# Patient Record
Sex: Female | Born: 1950 | Race: White | Hispanic: No | Marital: Married | Smoking: Former smoker
Health system: Southern US, Community
[De-identification: ages and names within clinical notes are randomized; demographics above are authoritative.]

## PROBLEM LIST (undated history)

## (undated) DIAGNOSIS — E079 Disorder of thyroid, unspecified: Secondary | ICD-10-CM

## (undated) DIAGNOSIS — K5790 Diverticulosis of intestine, part unspecified, without perforation or abscess without bleeding: Secondary | ICD-10-CM

## (undated) DIAGNOSIS — I10 Essential (primary) hypertension: Secondary | ICD-10-CM

## (undated) DIAGNOSIS — E0500 Thyrotoxicosis with diffuse goiter without thyrotoxic crisis or storm: Secondary | ICD-10-CM

## (undated) DIAGNOSIS — J45909 Unspecified asthma, uncomplicated: Secondary | ICD-10-CM

## (undated) DIAGNOSIS — E7800 Pure hypercholesterolemia, unspecified: Secondary | ICD-10-CM

## (undated) DIAGNOSIS — I2510 Atherosclerotic heart disease of native coronary artery without angina pectoris: Secondary | ICD-10-CM

## (undated) DIAGNOSIS — N200 Calculus of kidney: Secondary | ICD-10-CM

## (undated) DIAGNOSIS — M069 Rheumatoid arthritis, unspecified: Secondary | ICD-10-CM

## (undated) DIAGNOSIS — R42 Dizziness and giddiness: Secondary | ICD-10-CM

## (undated) DIAGNOSIS — M810 Age-related osteoporosis without current pathological fracture: Secondary | ICD-10-CM

## (undated) DIAGNOSIS — E039 Hypothyroidism, unspecified: Secondary | ICD-10-CM

## (undated) HISTORY — DX: Hypothyroidism, unspecified: E03.9

## (undated) HISTORY — DX: Unspecified asthma, uncomplicated: J45.909

## (undated) HISTORY — DX: Disorder of thyroid, unspecified: E07.9

## (undated) HISTORY — DX: Pure hypercholesterolemia, unspecified: E78.00

## (undated) HISTORY — DX: Rheumatoid arthritis, unspecified: M06.9

## (undated) HISTORY — DX: Calculus of kidney: N20.0

## (undated) HISTORY — DX: Essential (primary) hypertension: I10

## (undated) HISTORY — DX: Dizziness and giddiness: R42

## (undated) HISTORY — DX: Diverticulosis of intestine, part unspecified, without perforation or abscess without bleeding: K57.90

## (undated) HISTORY — DX: Thyrotoxicosis with diffuse goiter without thyrotoxic crisis or storm: E05.00

## (undated) HISTORY — DX: Age-related osteoporosis without current pathological fracture: M81.0

---

## 1998-01-22 ENCOUNTER — Other Ambulatory Visit: Admission: RE | Admit: 1998-01-22 | Discharge: 1998-01-22 | Payer: Self-pay | Admitting: Obstetrics & Gynecology

## 1998-05-07 ENCOUNTER — Other Ambulatory Visit: Admission: RE | Admit: 1998-05-07 | Discharge: 1998-05-07 | Payer: Self-pay | Admitting: Otolaryngology

## 1999-02-02 ENCOUNTER — Other Ambulatory Visit: Admission: RE | Admit: 1999-02-02 | Discharge: 1999-02-02 | Payer: Self-pay | Admitting: Obstetrics & Gynecology

## 2000-03-09 ENCOUNTER — Other Ambulatory Visit: Admission: RE | Admit: 2000-03-09 | Discharge: 2000-03-09 | Payer: Self-pay | Admitting: Obstetrics & Gynecology

## 2000-04-07 ENCOUNTER — Ambulatory Visit (HOSPITAL_COMMUNITY): Admission: RE | Admit: 2000-04-07 | Discharge: 2000-04-07 | Payer: Self-pay | Admitting: Obstetrics & Gynecology

## 2000-04-07 ENCOUNTER — Encounter (INDEPENDENT_AMBULATORY_CARE_PROVIDER_SITE_OTHER): Payer: Self-pay

## 2001-03-15 ENCOUNTER — Other Ambulatory Visit: Admission: RE | Admit: 2001-03-15 | Discharge: 2001-03-15 | Payer: Self-pay | Admitting: Gynecology

## 2002-03-27 ENCOUNTER — Other Ambulatory Visit: Admission: RE | Admit: 2002-03-27 | Discharge: 2002-03-27 | Payer: Self-pay | Admitting: Obstetrics & Gynecology

## 2003-03-13 ENCOUNTER — Encounter: Admission: RE | Admit: 2003-03-13 | Discharge: 2003-03-13 | Payer: Self-pay | Admitting: Obstetrics & Gynecology

## 2003-04-01 ENCOUNTER — Other Ambulatory Visit: Admission: RE | Admit: 2003-04-01 | Discharge: 2003-04-01 | Payer: Self-pay | Admitting: Obstetrics & Gynecology

## 2004-04-05 ENCOUNTER — Other Ambulatory Visit: Admission: RE | Admit: 2004-04-05 | Discharge: 2004-04-05 | Payer: Self-pay | Admitting: Obstetrics & Gynecology

## 2005-03-22 ENCOUNTER — Other Ambulatory Visit: Admission: RE | Admit: 2005-03-22 | Discharge: 2005-03-22 | Payer: Self-pay | Admitting: Obstetrics & Gynecology

## 2005-08-14 ENCOUNTER — Emergency Department (HOSPITAL_COMMUNITY): Admission: EM | Admit: 2005-08-14 | Discharge: 2005-08-15 | Payer: Self-pay | Admitting: Emergency Medicine

## 2005-08-16 ENCOUNTER — Ambulatory Visit: Payer: Self-pay | Admitting: Internal Medicine

## 2010-03-27 ENCOUNTER — Emergency Department (HOSPITAL_COMMUNITY)
Admission: EM | Admit: 2010-03-27 | Discharge: 2010-03-27 | Payer: Self-pay | Source: Home / Self Care | Admitting: Emergency Medicine

## 2010-06-07 LAB — POCT I-STAT, CHEM 8
BUN: 8 mg/dL (ref 6–23)
Calcium, Ion: 1.18 mmol/L (ref 1.12–1.32)
Chloride: 109 mEq/L (ref 96–112)
Creatinine, Ser: 0.7 mg/dL (ref 0.4–1.2)
Glucose, Bld: 96 mg/dL (ref 70–99)
HCT: 42 % (ref 36.0–46.0)
Hemoglobin: 14.3 g/dL (ref 12.0–15.0)
Potassium: 4.2 mEq/L (ref 3.5–5.1)
Sodium: 144 mEq/L (ref 135–145)
TCO2: 27 mmol/L (ref 0–100)

## 2010-06-07 LAB — D-DIMER, QUANTITATIVE: D-Dimer, Quant: <0.22 ug/mL-FEU (ref 0.00–0.48)

## 2010-08-13 NOTE — Op Note (Signed)
[facility]  Patient:    Brandy Townsend, Brandy Townsend                        MRN: [id number] Proc. Date: 04/07/00 Adm. Date:  [id number] Disc. Date: [id number] Attending:  Mickle Mallory                           Operative Report  PATIENTS A[age]:                [age].  PREOPERATIVE DIAGNOSIS:       Abnormal uterine bleeding.  POSTOPERATIVE DIAGNOSIS:      Abnormal uterine bleeding.  PATHOLOGY:                    Pending.  PROCEDURE:                    1. Examination under anesthesia.                               2. Dilatation and curettage.  SURGEON:                      Gerrit Friends. Aldona Bar, M.D.  ANESTHESIA:                   Intravenous conscious sedation and paracervical                               block of 1% Xylocaine without epinephrine.  DESCRIPTION OF PROCEDURE:     Patient was taken to the operating room and after the satisfactory induction of intravenous conscious sedation, she was prepped and draped, having been placed in the modified lithotomy position in short Allen stirrups. At this time, she was prepped and draped and the bladder was drained of clear urine with red rubber catheter, in and out fashion.  Examination under anesthesia carried out at this time with findings consistent with a uterus that was slightly posterior and upper limits of normal size. Adnexal areas were unremarkable.  At this time, a single-tooth tenaculum was placed on the anterior lip of the cervix and a paracervical block carried out with approximately 18 cc of 1% Xylocaine without epinephrine. The internal os was then sounded to 9 cm posteriorly. The internal os was then dilated to a #25 Pratt dilator without difficulty. The cavity was then probed with the Randall stone forcep with a moderate amount of polypoid-like tissue produced. It felt as if there possibly was a septum in the midportion of the fundal area with the larger portion of the fundus on the patients  right side. Thereafter, using the serrated curet, the cavity was thoroughly, gently, and systematically curetted. All specimens were sent, labeled endometrial curettings, to pathology. Further probing with the Randall stone forcep produced no further tissue and further curettage with the small serrated curet produced no further tissue. At this time, the procedure was felt to be complete and was terminated. The patient was awakened and transported to recovery area in satisfactory condition. She tolerated the procedure well. All counts correct. Pathologic specimen consisted of endometrial curettings. Patient will be given a detailed instruction sheet at the time of discharge and will use Advil of Aleve as needed for cramping, and will return to the office in  approximately two to three weeks for followup.  CONDITION ON ARRIVAL TO RECOVERY ROOM:                Satisfactory. DD:  04/07/00 TD:  04/08/00 Job: [id number]

## 2010-09-14 ENCOUNTER — Other Ambulatory Visit (HOSPITAL_COMMUNITY): Payer: Self-pay | Admitting: Internal Medicine

## 2010-09-14 DIAGNOSIS — E049 Nontoxic goiter, unspecified: Secondary | ICD-10-CM

## 2010-09-23 ENCOUNTER — Encounter (HOSPITAL_COMMUNITY)
Admission: RE | Admit: 2010-09-23 | Discharge: 2010-09-23 | Disposition: A | Discharge status: Home / Self Care | Payer: BC Managed Care – PPO | Source: Ambulatory Visit | Attending: Internal Medicine | Admitting: Internal Medicine

## 2010-09-23 DIAGNOSIS — F411 Generalized anxiety disorder: Secondary | ICD-10-CM | POA: Insufficient documentation

## 2010-09-23 DIAGNOSIS — R5381 Other malaise: Secondary | ICD-10-CM | POA: Insufficient documentation

## 2010-09-23 DIAGNOSIS — R5383 Other fatigue: Secondary | ICD-10-CM | POA: Insufficient documentation

## 2010-09-23 DIAGNOSIS — R05 Cough: Secondary | ICD-10-CM | POA: Insufficient documentation

## 2010-09-23 DIAGNOSIS — R635 Abnormal weight gain: Secondary | ICD-10-CM | POA: Insufficient documentation

## 2010-09-23 DIAGNOSIS — H9190 Unspecified hearing loss, unspecified ear: Secondary | ICD-10-CM | POA: Insufficient documentation

## 2010-09-23 DIAGNOSIS — E049 Nontoxic goiter, unspecified: Secondary | ICD-10-CM

## 2010-09-23 DIAGNOSIS — R059 Cough, unspecified: Secondary | ICD-10-CM | POA: Insufficient documentation

## 2010-09-24 ENCOUNTER — Encounter (HOSPITAL_COMMUNITY)
Admission: RE | Admit: 2010-09-24 | Discharge: 2010-09-24 | Disposition: A | Discharge status: Home / Self Care | Payer: BC Managed Care – PPO | Source: Ambulatory Visit | Attending: Internal Medicine | Admitting: Internal Medicine

## 2010-09-24 MED ORDER — SODIUM PERTECHNETATE TC 99M INJECTION
10.0000 | Freq: Once | INTRAVENOUS | Status: AC | PRN
  Administered 2010-09-24: 10 via INTRAVENOUS

## 2010-09-24 MED ORDER — SODIUM IODIDE I 131 CAPSULE
13.0000 | Freq: Once | INTRAVENOUS | Status: AC | PRN
  Administered 2010-09-24: 13 via ORAL

## 2010-09-27 ENCOUNTER — Ambulatory Visit (HOSPITAL_COMMUNITY): Payer: Self-pay

## 2010-09-28 ENCOUNTER — Other Ambulatory Visit (HOSPITAL_COMMUNITY): Payer: Self-pay

## 2012-11-01 ENCOUNTER — Other Ambulatory Visit: Payer: Self-pay | Admitting: Endocrinology

## 2012-11-01 DIAGNOSIS — E049 Nontoxic goiter, unspecified: Secondary | ICD-10-CM

## 2012-11-05 ENCOUNTER — Ambulatory Visit
Admission: RE | Admit: 2012-11-05 | Discharge: 2012-11-05 | Disposition: A | Discharge status: Home / Self Care | Payer: PRIVATE HEALTH INSURANCE | Source: Ambulatory Visit | Attending: Endocrinology | Admitting: Endocrinology

## 2012-11-05 DIAGNOSIS — E049 Nontoxic goiter, unspecified: Secondary | ICD-10-CM

## 2012-11-06 ENCOUNTER — Other Ambulatory Visit: Payer: Self-pay | Admitting: Endocrinology

## 2012-11-06 DIAGNOSIS — E042 Nontoxic multinodular goiter: Secondary | ICD-10-CM

## 2012-11-06 DIAGNOSIS — E041 Nontoxic single thyroid nodule: Secondary | ICD-10-CM

## 2012-11-13 ENCOUNTER — Other Ambulatory Visit (HOSPITAL_COMMUNITY)
Admission: RE | Admit: 2012-11-13 | Discharge: 2012-11-13 | Disposition: A | Discharge status: Home / Self Care | Payer: PRIVATE HEALTH INSURANCE | Source: Ambulatory Visit | Attending: Interventional Radiology | Admitting: Interventional Radiology

## 2012-11-13 ENCOUNTER — Ambulatory Visit
Admission: RE | Admit: 2012-11-13 | Discharge: 2012-11-13 | Disposition: A | Discharge status: Home / Self Care | Payer: PRIVATE HEALTH INSURANCE | Source: Ambulatory Visit | Attending: Endocrinology | Admitting: Endocrinology

## 2012-11-13 DIAGNOSIS — E041 Nontoxic single thyroid nodule: Secondary | ICD-10-CM

## 2012-11-16 ENCOUNTER — Other Ambulatory Visit: Payer: BC Managed Care – PPO

## 2013-06-04 ENCOUNTER — Other Ambulatory Visit: Payer: Self-pay | Admitting: Obstetrics & Gynecology

## 2014-01-13 ENCOUNTER — Ambulatory Visit (INDEPENDENT_AMBULATORY_CARE_PROVIDER_SITE_OTHER): Payer: No Typology Code available for payment source

## 2014-01-13 ENCOUNTER — Ambulatory Visit (INDEPENDENT_AMBULATORY_CARE_PROVIDER_SITE_OTHER): Payer: No Typology Code available for payment source | Admitting: Podiatry

## 2014-01-13 VITALS — BP 110/74 | HR 75 | Resp 16

## 2014-01-13 DIAGNOSIS — M779 Enthesopathy, unspecified: Secondary | ICD-10-CM

## 2014-01-13 DIAGNOSIS — M79671 Pain in right foot: Secondary | ICD-10-CM

## 2014-01-13 DIAGNOSIS — M722 Plantar fascial fibromatosis: Secondary | ICD-10-CM

## 2014-01-13 MED ORDER — TRIAMCINOLONE ACETONIDE 10 MG/ML IJ SUSP
10.0000 mg | Freq: Once | INTRAMUSCULAR | Status: AC
  Administered 2014-01-13: 10 mg

## 2014-01-13 NOTE — Progress Notes (Signed)
Subjective:     Patient ID: Brandy Townsend, female   DOB: 1950/05/29, 64 y.o.   MRN: [id number]  HPI patient states my right heel has been hurting me a lot recently and I am leaving for [facility] in 8 days   Review of Systems  All other systems reviewed and are negative.      Objective:   Physical Exam  Nursing note and vitals reviewed. Constitutional: She is oriented to person, place, and time.  Cardiovascular: Intact distal pulses.   Musculoskeletal: Normal range of motion.  Neurological: She is oriented to person, place, and time.  Skin: Skin is warm.   neurovascular status intact with muscle strength adequate and range of motion of the subtalar and midtarsal joint within normal limits. Patient's noted to have normal digital perfusion and no equinus condition and is well oriented x3. I noted discomfort in the medial fascially heel right at the insertion of the tendon into the calcaneus with fluid buildup noted at the insertion     Assessment:     Plantar fasciitis right with inflammation noted    Plan:     H&P and x-ray reviewed and injected the right plantar fascia 3 mg Kenalog 5 mg Xylocaine and applied fascially brace. Reappoint one week before her trip

## 2014-01-13 NOTE — Progress Notes (Signed)
   Subjective:    Patient ID: Brandy Townsend, female    DOB: 1950-09-06, 63 y.o.   MRN: [id number]  HPI Pt presents with right foot plantar fasciitis pain. Noticed pain 2 weeks prior   Review of Systems  Respiratory: Positive for wheezing.   All other systems reviewed and are negative.      Objective:   Physical Exam        Assessment & Plan:

## 2014-01-20 ENCOUNTER — Ambulatory Visit (INDEPENDENT_AMBULATORY_CARE_PROVIDER_SITE_OTHER): Payer: No Typology Code available for payment source | Admitting: Podiatry

## 2014-01-20 ENCOUNTER — Encounter: Payer: Self-pay | Admitting: Podiatry

## 2014-01-20 DIAGNOSIS — M722 Plantar fascial fibromatosis: Secondary | ICD-10-CM

## 2014-01-20 MED ORDER — TRIAMCINOLONE ACETONIDE 10 MG/ML IJ SUSP
10.0000 mg | Freq: Once | INTRAMUSCULAR | Status: AC
  Administered 2014-01-20: 10 mg

## 2014-01-21 NOTE — Progress Notes (Signed)
Subjective:     Patient ID: Brandy Townsend, female   DOB: 1950/11/13, 63 y.o.   MRN: [id number]  HPI patient states that I'm still having pain in my right heel and I am leaving for a trip tomorrow   Review of Systems     Objective:   Physical Exam Neurovascular status intact with discomfort in the plantar heel right at the insertion of the tendon into the calcaneus    Assessment:     Plantar fasciitis right with inflammation and fluid buildup    Plan:     Reinjected plantar fascia 3 mg Kenalog 5 mg Xylocaine and advised on physical therapy and supportive shoe and reappoint if symptoms persist

## 2014-09-15 ENCOUNTER — Encounter: Payer: Self-pay | Admitting: Podiatry

## 2014-09-15 ENCOUNTER — Ambulatory Visit (INDEPENDENT_AMBULATORY_CARE_PROVIDER_SITE_OTHER): Payer: No Typology Code available for payment source

## 2014-09-15 ENCOUNTER — Ambulatory Visit (INDEPENDENT_AMBULATORY_CARE_PROVIDER_SITE_OTHER): Payer: No Typology Code available for payment source | Admitting: Podiatry

## 2014-09-15 VITALS — BP 128/76 | HR 72 | Resp 12

## 2014-09-15 DIAGNOSIS — M779 Enthesopathy, unspecified: Secondary | ICD-10-CM

## 2014-09-15 MED ORDER — TRIAMCINOLONE ACETONIDE 10 MG/ML IJ SUSP
10.0000 mg | Freq: Once | INTRAMUSCULAR | Status: AC
  Administered 2014-09-15: 10 mg

## 2014-09-15 NOTE — Progress Notes (Signed)
   Subjective:    Patient ID: Brandy Townsend, female    DOB: Dec 08, 1950, 64 y.o.   MRN: [id number]  HPI  PT STATED RT BALL OF THE FOOT HAVING BURNING AND SORE FOR 3 WEEKS. THE FOOT IS GETTING WORSE ESPECIALLY WHEN WALKING BARE FOOT. TRIED TO WEAR GOOD SHOES BUT NO HELP  Review of Systems  Musculoskeletal: Positive for gait problem.       Objective:   Physical Exam        Assessment & Plan:

## 2014-09-15 NOTE — Progress Notes (Signed)
Subjective:     Patient ID: Brandy Townsend, female   DOB: Aug 14, 1950, 64 y.o.   MRN: [id number]  HPI patient states she's getting a lot of pain in the bottom of her right foot of approximate 3 weeks' duration after wearing very thin shoes and doing a lot of walking   Review of Systems     Objective:   Physical Exam Neurovascular status intact muscle strength adequate with inflammation and pain third metatarsophalangeal joint right with fluid buildup within the joint surface    Assessment:     Inflammatory capsulitis right third MPJ    Plan:     X-ray reviewed with patient and at this time did a proximal nerve block aspirated the joint giving out a small amount of blood indicating some irritation of the capsule and injected with a quarter cc of dexamethasone Kenalog and applied padding to reduce pressure against the joint surface. Reappoint to recheck as symptoms indicate

## 2015-02-09 ENCOUNTER — Ambulatory Visit (INDEPENDENT_AMBULATORY_CARE_PROVIDER_SITE_OTHER): Payer: No Typology Code available for payment source | Admitting: Podiatry

## 2015-02-09 ENCOUNTER — Encounter: Payer: Self-pay | Admitting: Podiatry

## 2015-02-09 VITALS — BP 147/88 | HR 89 | Resp 16

## 2015-02-09 DIAGNOSIS — M779 Enthesopathy, unspecified: Secondary | ICD-10-CM | POA: Diagnosis not present

## 2015-02-09 MED ORDER — TRIAMCINOLONE ACETONIDE 10 MG/ML IJ SUSP
10.0000 mg | Freq: Once | INTRAMUSCULAR | Status: AC
  Administered 2015-02-09: 10 mg

## 2015-02-09 NOTE — Progress Notes (Signed)
Subjective:     Patient ID: Brandy Townsend, female   DOB: June 02, 1950, 64 y.o.   MRN: [id number]  HPI patient states that this joint is really bothering me again not as bad as previous but present   Review of Systems     Objective:   Physical Exam neurovascular status intact with inflamed third MPJ right    with mild elevation of the toe  Assessment:     Inflammatory capsulitis third MPJ right with digital deformity    Plan:     Proximal block administered 60 mg Xylocaine Marcaine mixture and I aspirated the third MPJ getting out of small amount of clear fluid and injected with half cc of dexamethasone Kenalog. Applied thick plantar padding take pressure off the joint and reappoint as needed

## 2015-05-16 ENCOUNTER — Observation Stay (HOSPITAL_COMMUNITY)
Admission: EM | Admit: 2015-05-16 | Discharge: 2015-05-17 | Disposition: A | Discharge status: Home / Self Care | Payer: No Typology Code available for payment source | Attending: Internal Medicine | Admitting: Internal Medicine

## 2015-05-16 ENCOUNTER — Emergency Department (HOSPITAL_COMMUNITY): Payer: No Typology Code available for payment source

## 2015-05-16 ENCOUNTER — Encounter (HOSPITAL_COMMUNITY): Payer: Self-pay

## 2015-05-16 DIAGNOSIS — Z79899 Other long term (current) drug therapy: Secondary | ICD-10-CM | POA: Insufficient documentation

## 2015-05-16 DIAGNOSIS — Z87891 Personal history of nicotine dependence: Secondary | ICD-10-CM | POA: Diagnosis not present

## 2015-05-16 DIAGNOSIS — R0602 Shortness of breath: Secondary | ICD-10-CM | POA: Insufficient documentation

## 2015-05-16 DIAGNOSIS — I209 Angina pectoris, unspecified: Secondary | ICD-10-CM | POA: Diagnosis not present

## 2015-05-16 DIAGNOSIS — R072 Precordial pain: Secondary | ICD-10-CM | POA: Diagnosis not present

## 2015-05-16 DIAGNOSIS — I10 Essential (primary) hypertension: Secondary | ICD-10-CM | POA: Diagnosis present

## 2015-05-16 DIAGNOSIS — E0590 Thyrotoxicosis, unspecified without thyrotoxic crisis or storm: Secondary | ICD-10-CM | POA: Diagnosis present

## 2015-05-16 DIAGNOSIS — R079 Chest pain, unspecified: Secondary | ICD-10-CM | POA: Diagnosis not present

## 2015-05-16 DIAGNOSIS — R7989 Other specified abnormal findings of blood chemistry: Secondary | ICD-10-CM | POA: Diagnosis present

## 2015-05-16 DIAGNOSIS — E079 Disorder of thyroid, unspecified: Secondary | ICD-10-CM | POA: Diagnosis not present

## 2015-05-16 LAB — CBC
HCT: 40.9 % (ref 36.0–46.0)
HEMOGLOBIN: 13.5 g/dL (ref 12.0–15.0)
MCH: 28.8 pg (ref 26.0–34.0)
MCHC: 33 g/dL (ref 30.0–36.0)
MCV: 87.4 fL (ref 78.0–100.0)
Platelets: 338 10*3/uL (ref 150–400)
RBC: 4.68 MIL/uL (ref 3.87–5.11)
RDW: 13.3 % (ref 11.5–15.5)
WBC: 8.9 10*3/uL (ref 4.0–10.5)

## 2015-05-16 LAB — BASIC METABOLIC PANEL
ANION GAP: 9 (ref 5–15)
BUN: 24 mg/dL — ABNORMAL HIGH (ref 6–20)
CALCIUM: 9.5 mg/dL (ref 8.9–10.3)
CHLORIDE: 106 mmol/L (ref 101–111)
CO2: 26 mmol/L (ref 22–32)
Creatinine, Ser: 0.78 mg/dL (ref 0.44–1.00)
GFR calc non Af Amer: >60 mL/min (ref >60)
Glucose, Bld: 114 mg/dL — ABNORMAL HIGH (ref 65–99)
Potassium: 4.4 mmol/L (ref 3.5–5.1)
Sodium: 141 mmol/L (ref 135–145)

## 2015-05-16 LAB — TROPONIN I: Troponin I: <0.03 ng/mL (ref <0.031)

## 2015-05-16 LAB — I-STAT TROPONIN, ED: TROPONIN I, POC: 0 ng/mL (ref 0.00–0.08)

## 2015-05-16 MED ORDER — MORPHINE SULFATE (PF) 2 MG/ML IV SOLN: 2.0000 mg | INTRAVENOUS | Status: DC | PRN

## 2015-05-16 MED ORDER — ENOXAPARIN SODIUM 40 MG/0.4ML ~~LOC~~ SOLN: 40.0000 mg | SUBCUTANEOUS | Status: DC

## 2015-05-16 MED ORDER — GI COCKTAIL ~~LOC~~: 30.0000 mL | Freq: Four times a day (QID) | ORAL | Status: DC | PRN

## 2015-05-16 MED ORDER — METHIMAZOLE 5 MG PO TABS
5.0000 mg | ORAL_TABLET | ORAL | Status: DC
  Filled 2015-05-16: qty 1

## 2015-05-16 MED ORDER — ASPIRIN 81 MG PO CHEW
324.0000 mg | CHEWABLE_TABLET | Freq: Once | ORAL | Status: AC
  Administered 2015-05-16: 324 mg via ORAL
  Filled 2015-05-16: qty 4

## 2015-05-16 MED ORDER — AMOXICILLIN-POT CLAVULANATE 875-125 MG PO TABS
1.0000 | ORAL_TABLET | Freq: Two times a day (BID) | ORAL | Status: DC
  Administered 2015-05-16 – 2015-05-17 (×2): 1 via ORAL
  Filled 2015-05-16 (×2): qty 1

## 2015-05-16 MED ORDER — POTASSIUM CHLORIDE CRYS ER 10 MEQ PO TBCR
10.0000 meq | EXTENDED_RELEASE_TABLET | Freq: Every day | ORAL | Status: DC
  Administered 2015-05-16 – 2015-05-17 (×2): 10 meq via ORAL
  Filled 2015-05-16 (×4): qty 1

## 2015-05-16 MED ORDER — ACETAMINOPHEN 325 MG PO TABS
650.0000 mg | ORAL_TABLET | ORAL | Status: DC | PRN
  Administered 2015-05-16 – 2015-05-17 (×4): 650 mg via ORAL
  Filled 2015-05-16 (×4): qty 2

## 2015-05-16 MED ORDER — METHIMAZOLE 5 MG PO TABS
5.0000 mg | ORAL_TABLET | ORAL | Status: DC
  Administered 2015-05-16 – 2015-05-17 (×2): 5 mg via ORAL
  Filled 2015-05-16 (×3): qty 1

## 2015-05-16 MED ORDER — ALBUTEROL SULFATE (2.5 MG/3ML) 0.083% IN NEBU
2.5000 mg | INHALATION_SOLUTION | Freq: Four times a day (QID) | RESPIRATORY_TRACT | Status: DC | PRN
Start: 2015-05-16 — End: 2015-05-17

## 2015-05-16 MED ORDER — LORATADINE 10 MG PO TABS
10.0000 mg | ORAL_TABLET | Freq: Every day | ORAL | Status: DC
  Administered 2015-05-16 – 2015-05-17 (×2): 10 mg via ORAL
  Filled 2015-05-16 (×2): qty 1

## 2015-05-16 MED ORDER — ALBUTEROL SULFATE HFA 108 (90 BASE) MCG/ACT IN AERS: 1.0000 | INHALATION_SPRAY | Freq: Four times a day (QID) | RESPIRATORY_TRACT | Status: DC | PRN

## 2015-05-16 MED ORDER — SODIUM CHLORIDE 0.9 % IV SOLN: INTRAVENOUS | Status: DC

## 2015-05-16 MED ORDER — VERAPAMIL HCL ER 240 MG PO TBCR
240.0000 mg | EXTENDED_RELEASE_TABLET | Freq: Every day | ORAL | Status: DC
  Administered 2015-05-16 – 2015-05-17 (×2): 240 mg via ORAL
  Filled 2015-05-16 (×2): qty 1

## 2015-05-16 MED ORDER — AZELASTINE HCL 0.1 % NA SOLN
1.0000 | Freq: Two times a day (BID) | NASAL | Status: DC | PRN
  Administered 2015-05-16 – 2015-05-17 (×2): 2 via NASAL
  Filled 2015-05-16: qty 30

## 2015-05-16 MED ORDER — NITROGLYCERIN 0.4 MG SL SUBL
0.4000 mg | SUBLINGUAL_TABLET | SUBLINGUAL | Status: DC | PRN
  Administered 2015-05-16 (×3): 0.4 mg via SUBLINGUAL
  Filled 2015-05-16: qty 1

## 2015-05-16 MED ORDER — ONDANSETRON HCL 4 MG/2ML IJ SOLN: 4.0000 mg | Freq: Four times a day (QID) | INTRAMUSCULAR | Status: DC | PRN

## 2015-05-16 MED ORDER — ASPIRIN EC 325 MG PO TBEC
325.0000 mg | DELAYED_RELEASE_TABLET | Freq: Every day | ORAL | Status: DC
  Administered 2015-05-17: 325 mg via ORAL
  Filled 2015-05-16: qty 1

## 2015-05-16 NOTE — ED Notes (Addendum)
Pt from home, pt woke up with mid chest pressure non radiating. Pt states that she has been having some sob since CP started, denies n/v, dizziness, or radiation. Pt states that she has been taking potassium for 2 weeks for low K level. Pt takes BP medication but has no other cardiac hx. Pt taking augmentin for recent URI

## 2015-05-16 NOTE — ED Provider Notes (Addendum)
CSN: [id number]     Arrival date & time 05/16/15  0341 History  By signing my name below, I, Emmanuella Mensah, attest that this documentation has been prepared under the direction and in the presence of Varney Biles, MD. Electronically Signed: Judithann Sauger, ED Scribe. 05/16/2015. 4:03 AM.      Chief Complaint  Patient presents with  . Chest Pain   The history is provided by the patient. No language interpreter was used.   HPI Comments: Brandy Townsend is a 65 y.o. female with a hx of HTN and thyroid disease who presents to the Emergency Department complaining of gradually improving 5/10 constant non-radiating substernal chest pressure onset 3 hours ago that woke her up from her sleep. She reports associated SOB and persistent productive cough with clear sputum. She adds that the pain radiated to the posterior left shoulder blade but that has since resolved. She states that she has been walking lately and it takes approx. one mile before she feels SOB. She reports a family hx of HI with her father when he was 4 years. She denies any dizziness, lightheadedness, n/v or acute SOB/CP upon exertion.    Past Medical History  Diagnosis Date  . Hypertension   . Thyroid disease    History reviewed. No pertinent past surgical history. History reviewed. No pertinent family history. Social History  Substance Use Topics  . Smoking status: Former Smoker    Quit date: 03/28/2009  . Smokeless tobacco: None  . Alcohol Use: No   OB History    No data available     Review of Systems  Respiratory: Positive for cough and shortness of breath.   Cardiovascular: Positive for chest pain.  Gastrointestinal: Negative for nausea and vomiting.      Allergies  Microbial antigen  Home Medications   Prior to Admission medications   Medication Sig Start Date End Date Taking? Authorizing Provider  albuterol (PROVENTIL HFA;VENTOLIN HFA) 108 (90 BASE) MCG/ACT inhaler Inhale into the lungs every 6  (six) hours as needed for wheezing or shortness of breath.    Historical Provider, MD  azelastine (ASTELIN) 0.1 % nasal spray Place into both nostrils 2 (two) times daily. Use in each nostril as directed    Historical Provider, MD  diphenhydrAMINE (BENADRYL) 25 MG tablet Take 25 mg by mouth every 6 (six) hours as needed.    Historical Provider, MD  KLOR-CON 10 10 MEQ tablet Take 10 mEq by mouth daily. 08/11/14   Historical Provider, MD  methimazole (TAPAZOLE) 5 MG tablet Take 5 mg by mouth 3 (three) times daily.    Historical Provider, MD  triamterene-hydrochlorothiazide (DYAZIDE) 37.5-25 MG per capsule TAKE ONE CAPSULE BY MOUTH EVERY DAY FOR FLUID 07/25/14   Historical Provider, MD  verapamil (CALAN-SR) 240 MG CR tablet Take 240 mg by mouth daily. 07/25/14   Historical Provider, MD   BP 149/76 mmHg  Pulse 79  Temp(Src) 97.9 F (36.6 C) (Oral)  Resp 11  Ht 5\' 4"  (1.626 m)  Wt 153 lb (69.4 kg)  BMI 26.25 kg/m2  SpO2 100% Physical Exam  Constitutional: She appears well-developed and well-nourished. No distress.  HENT:  Head: Normocephalic and atraumatic.  Mouth/Throat: Oropharynx is clear and moist. No oropharyngeal exudate.  Eyes: Conjunctivae and EOM are normal. Pupils are equal, round, and reactive to light. Right eye exhibits no discharge. Left eye exhibits no discharge. No scleral icterus.  Neck: Normal range of motion. Neck supple. No JVD present. No thyromegaly present.  Cardiovascular:  Normal rate, regular rhythm, normal heart sounds and intact distal pulses.  Exam reveals no gallop and no friction rub.   No murmur heard. Pulmonary/Chest: Effort normal and breath sounds normal. No respiratory distress. She has no wheezes. She has no rales.  Abdominal: Soft. Bowel sounds are normal. She exhibits no distension and no mass. There is no tenderness.  Musculoskeletal: Normal range of motion. She exhibits no edema or tenderness.  2+ and equal radial pulse No unilateral cal swelling, no  pitting edema, no TTP  Lymphadenopathy:    She has no cervical adenopathy.  Neurological: She is alert. Coordination normal.  Skin: Skin is warm and dry. No rash noted. No erythema.  Psychiatric: She has a normal mood and affect. Her behavior is normal.  Nursing note and vitals reviewed.   ED Course  Procedures (including critical care time) DIAGNOSTIC STUDIES: Oxygen Saturation is 100% on RA, normal by my interpretation.    COORDINATION OF CARE: 3:59 AM- Pt advised of plan for treatment and pt agrees.    Labs Review Labs Reviewed  CBC  BASIC METABOLIC PANEL  I-STAT [place], ED    Imaging Review No results found.   Varney Biles, MD has personally reviewed and evaluated these images and lab results as part of her medical decision-making.   EKG Interpretation   Date/Time:  Saturday May 16 2015 03:47:18 EST Ventricular Rate:  78 PR Interval:  117 QRS Duration: 80 QT Interval:  374 QTC Calculation: 426 R Axis:   67 Text Interpretation:  Sinus rhythm Borderline short PR interval No  significant change since last tracing normal axis Confirmed by Kathrynn Humble,  MD, Graclynn Vanantwerp [phone]) on 05/16/2015 3:53:47 AM      MDM   Final diagnoses:  Angina pectoris ([place])    I personally performed the services described in this documentation, which was scribed in my presence. The recorded information has been reviewed and is accurate.  Pt comes in with cc of chest pain. She has hx of HTN. Chest pain is substernal, pressure like, with radiation to the T shoulder blade at some point. Not GERD, not pleuritic pain.  HEAR score is 3  History Moderately suspicious 1   ECG  Normal 0   Age  68 - 53 years 1   Risk Factors  1 or 2 risk factors 1 (2 risk factor - HTN, family hx) No risk factors known 0    Will start nitro. EKG is reassuring. Will reassess.  Varney Biles, MD 05/16/15 0434  6:12 AM Pt's pain resolved with nitro.  I think one can argue that the HEAR  score can be bumped up to 4 - but irregardless, HEAR score is not 100% sensitive, and it is probably best to admit the patient for chest pain r/o.  Varney Biles, MD 05/16/15 [phone]

## 2015-05-16 NOTE — ED Notes (Signed)
Pt states that squeezing sensation across her chest is gone, but very mild pressure of 1/10 is still there after 3 nitro

## 2015-05-16 NOTE — H&P (Signed)
[facility] History and Physical                                                                                    Brandy Townsend, is a 65 y.o. female  MRN: [id number]   DOB - [birth year]  Admit Date - 05/16/2015  Outpatient Primary MD for the patient is Paulo Fruit, MD  Referring MD: Kathrynn Humble / ER  PMH: Past Medical History  Diagnosis Date  . Hypertension   . Thyroid disease       PSH: History reviewed. No pertinent past surgical history.   CC:  Chief Complaint  Patient presents with  . Chest Pain     HPI: 65 year old female patient with history of hypertension, hyperthyroidism and former tobacco abuse (quit 2011). Patient presents to the ER. Experiencing chest pain that awakened her from sleep. Patient reports that she awakened from sleep with significant chest pressure under her left breast that was nonradiating and had no other associating symptoms. She got up to check her blood pressure which was 180/100. The pain continued and she felt somewhat chilled. She thought it might be indigestion related to her potassium pills so she went to her computer to reference this. She rested somewhat thinking this may help her pain go away but it did not then the pain began to radiate towards her left shoulder although she feels this may be related to tension stating she has previously had a muscle injury in that area and when she tenses her right shoulder blade will hurt. Later the pain became more "rubber band-like" beneath both breasts again with significant pressure. She relates that the pain was about a 7-8/10. At this point she became concerned awakened her husband and presented to the ER. Patient recently started an exercise program and states she has been very active and utilizing the treadmill at running speeds without any chest pain or shortness of breath. She does report that because of her chronic sinus congestion and allergies that she does have some trouble breathing with  activity especially if she tries to breathe through her nose. She reports that recently because of her sinus congestion she's been coughing quite a bit and thought that some of this pain may be related to muscle strain. Patient reports that since receiving treatment in the ER her pain has resolved. (Aspirin and nitroglycerin 3 doses)  ER Evaluation and treatment: Temperature 97.9, BP 149/76 and down to 129/59 after nitroglycerin, pulse 79, respirations 15, room air saturations 100%. EKG: Sinus rhythm with ventricular rate 78 bpm, QTC 426 ms, subtle flattening of ST segments in inferior lateral leads without true ischemic changes. No old EKGs for comparison 2 View CXR: No active cardiopulmonary disease Laboratory data: Na 141, K 4.4, BUN 24,Cr 0.78, point-of-care troponin 0.00, WBC 8900, hemoglobin 13.5, platelets 338,000 Aspirin 324 mg by mouth 1 Nitroglycerin 0.4 mg sublingual 3 doses  Review of Systems   In addition to the HPI above,  No Fever, myalgias or other constitutional symptoms No Headache (except for after receiving nitroglycerin), changes with Vision or hearing, new weakness, tingling, numbness in any extremity, No problems swallowing food or Liquids, indigestion/reflux No palpitations, orthopnea  or DOE No Abdominal pain, N/V; no melena or hematochezia, no dark tarry stools, Bowel movements are regular, No dysuria, hematuria or flank pain No new skin rashes, lesions, masses or bruises, No new joints pains-aches No recent weight gain or loss No polyuria, polydypsia or polyphagia,  *A full 10 point Review of Systems was done, except as stated above, all other Review of Systems were negative.  Social History Social History  Substance Use Topics  . Smoking status: Former Smoker    Quit date: 03/28/2009  . Smokeless tobacco: Not on file  . Alcohol Use: No    Resides at: Private residence  Lives with: Spouse  Ambulatory status: Without assistive devices   Family  History Father: History of CAD and multiple MIs, CABG procedure-heavy smoker   Prior to Admission medications   Medication Sig Start Date End Date Taking? Authorizing Provider  albuterol (PROVENTIL HFA;VENTOLIN HFA) 108 (90 BASE) MCG/ACT inhaler Inhale into the lungs every 6 (six) hours as needed for wheezing or shortness of breath.   Yes Historical Provider, MD  amoxicillin-clavulanate (AUGMENTIN) 875-125 MG tablet Take 1 tablet by mouth 2 (two) times daily.   Yes Historical Provider, MD  azelastine (ASTELIN) 0.1 % nasal spray Place 1-2 sprays into both nostrils 2 (two) times daily as needed for rhinitis or allergies.    Yes Historical Provider, MD  diphenhydrAMINE (BENADRYL) 25 MG tablet Take 25 mg by mouth every 6 (six) hours as needed for itching.    Yes Historical Provider, MD  fexofenadine (ALLEGRA) 180 MG tablet Take 180 mg by mouth daily.   Yes Historical Provider, MD  KLOR-CON 10 10 MEQ tablet Take 10 mEq by mouth daily. 08/11/14  Yes Historical Provider, MD  methimazole (TAPAZOLE) 5 MG tablet Take 5 mg by mouth every morning.    Yes Historical Provider, MD  triamterene-hydrochlorothiazide (DYAZIDE) 37.5-25 MG per capsule TAKE ONE CAPSULE BY MOUTH EVERY DAY FOR FLUID 07/25/14  Yes Historical Provider, MD  verapamil (CALAN-SR) 240 MG CR tablet Take 240 mg by mouth daily. 07/25/14  Yes Historical Provider, MD    Allergies  Allergen Reactions  . Macrobid [employer] Macro] Other (See Comments)    Lowered potassium, caused aches and pains    Physical Exam  Vitals  Blood pressure 129/59, pulse 65, temperature 97.9 F (36.6 C), temperature source Oral, resp. rate 17, height 5\' 4"  (1.626 m), weight 153 lb (69.4 kg), SpO2 99 %.   General:  In no acute distress, appears healthy and well nourished  Psych:  Normal affect, Denies Suicidal or Homicidal ideations, Awake Alert, Oriented X 3. Speech and thought patterns are clear and appropriate, no apparent short term memory  deficits  Neuro:   No focal neurological deficits, CN II through XII intact, Strength 5/5 all 4 extremities, Sensation intact all 4 extremities.  ENT:  Ears and Eyes appear Normal, Conjunctivae clear, PER. Moist oral mucosa without erythema or exudates. Sinus congestion with clear rhinorrhea  Neck:  Supple, No lymphadenopathy appreciated  Respiratory:  Symmetrical chest wall movement, Good air movement bilaterally, CTAB. Room Air -chest pain not reproducible with palpation over anterior chest wall or rib cage  Cardiac:  RRR, No Murmurs, no LE edema noted, no JVD, No carotid bruits, peripheral pulses palpable at 2+  Abdomen:  Positive bowel sounds, Soft, Non tender including over epigastrium and right upper quadrant, Non distended,  No masses appreciated, no obvious hepatosplenomegaly  Skin:  No Cyanosis, Normal Skin Turgor, No Skin Rash or Bruise.  Extremities: Symmetrical without obvious trauma or injury,  no effusions. Chest pain not reproducible with movement of upper extremities.  Data Review  CBC  Recent Labs Lab 05/16/15 0355  WBC 8.9  HGB 13.5  HCT 40.9  PLT 338  MCV 87.4  MCH 28.8  MCHC 33.0  RDW 13.3    Chemistries   Recent Labs Lab 05/16/15 0355  NA 141  K 4.4  CL 106  CO2 26  GLUCOSE 114*  BUN 24*  CREATININE 0.78  CALCIUM 9.5    estimated creatinine clearance is 69.8 mL/min (by C-G formula based on Cr of 0.78).  No results for input(s): TSH, T4TOTAL, T3FREE, THYROIDAB in the last 72 hours.  Invalid input(s): FREET3  Coagulation profile No results for input(s): INR, PROTIME in the last 168 hours.  No results for input(s): DDIMER in the last 72 hours.  Cardiac Enzymes No results for input(s): CKMB, TROPONINI, MYOGLOBIN in the last 168 hours.  Invalid input(s): CK  Invalid input(s): POCBNP  Urinalysis No results found for: COLORURINE, APPEARANCEUR, LABSPEC, PHURINE, GLUCOSEU, HGBUR, BILIRUBINUR, KETONESUR, PROTEINUR, UROBILINOGEN, NITRITE,  LEUKOCYTESUR  Imaging results:   Dg Chest 2 View  05/16/2015  CLINICAL DATA:  Non radiating chest pressure with some shortness of breath. EXAM: CHEST  2 VIEW COMPARISON:  03/07/2014 FINDINGS: Normal heart size and pulmonary vascularity. No focal airspace disease or consolidation in the lungs. No blunting of costophrenic angles. No pneumothorax. Mediastinal contours appear intact. Mild degenerative changes in the spine. IMPRESSION: No active cardiopulmonary disease. Electronically Signed   By: Lucienne Capers M.D.   On: 05/16/2015 05:02     EKG: (Independently reviewed)  Sinus rhythm with ventricular rate 78 bpm, QTC 426 ms, subtle flattening of ST segments in inferior lateral leads without true ischemic changes. No old EKGs for comparison   Assessment & Plan  Principal Problem:   Chest pain syndrome -Heart score equals 4 and symptoms somewhat concerning given patient's age, underlying hypertension as well as prior smoking history as well as significant family history for premature CAD -Symptoms resolved after 3 nitroglycerin but initial troponin as well as EKG essentially negative -Admit to telemetry/Obs -Cycle troponin -Echo -FLP -Full dose aspirin -Chest pain resolved and no EKG changes and no indication for full dose ACS heparin -Have tentatively scheduled for stress test in a.m. as long as troponins remain negative and rounding physician believes test is still indicated after echocardiogram resulted/interview and exam of patient on 2/19  Active Problems:   Prerenal azotemia -Suspect related to use of thiazide diuretic prior to admission -Holding diuretic for now -IV fluids while nothing by mouth for stress test    HTN  -Controlled -Continue verapamil    Hyperthyroidism -Continue Tapazole -No tachycardia or other signs to indicate need to check TSH this admission    DVT Prophylaxis: Lovenox  Family Communication:   No family at bedside  Code Status: Full  code  Condition:  Stable  Discharge disposition: Once medically stable and cardiac evaluation complete into his discharge back to previous home environment  Time spent in minutes : 60      ELLIS,ALLISON L. ANP on 05/16/2015 at 7:49 AM  You may contact me by going to [email or web address] - password [email or web address]  I am available from 7a-7p but please confirm I am on the schedule by going to Amion as above.   After 7p please contact night coverage person covering me after hours  [facility]  I have examined the patient, reviewed the chart  and modified the above note which I agree with. Awoke with heaviness in left chest with dyspnea relieved with Nitro x 3 given in ER and has not recurred. She does run on the treadmill almost daily but still need to rule out unstable angina. Follow cardiac enzymes. EKG unrevealing. Obtain ECHO and Myoview stress test.  Brandy Geers,MD Pager # [email or web address] 05/16/2015, 8:54 AM

## 2015-05-16 NOTE — ED Notes (Signed)
MD at bedside. 

## 2015-05-16 NOTE — ED Notes (Signed)
Dr Nanavati in room  

## 2015-05-17 ENCOUNTER — Observation Stay (HOSPITAL_BASED_OUTPATIENT_CLINIC_OR_DEPARTMENT_OTHER): Payer: No Typology Code available for payment source

## 2015-05-17 ENCOUNTER — Observation Stay (HOSPITAL_COMMUNITY): Payer: No Typology Code available for payment source

## 2015-05-17 DIAGNOSIS — R079 Chest pain, unspecified: Secondary | ICD-10-CM

## 2015-05-17 LAB — NM MYOCAR SINGLE W/SPECT
CHL CUP RESTING HR STRESS: 81 {beats}/min
CSEPED: 0 min
CSEPEDS: 0 s
CSEPPHR: 117 {beats}/min
Estimated workload: 1 METS
MPHR: 158 {beats}/min
Percent HR: 74 %

## 2015-05-17 LAB — LIPID PANEL
CHOL/HDL RATIO: 3.1 ratio
CHOLESTEROL: 162 mg/dL (ref 0–200)
HDL: 53 mg/dL (ref >40)
LDL CALC: 93 mg/dL (ref 0–99)
TRIGLYCERIDES: 81 mg/dL (ref <150)
VLDL: 16 mg/dL (ref 0–40)

## 2015-05-17 MED ORDER — REGADENOSON 0.4 MG/5ML IV SOLN
0.4000 mg | Freq: Once | INTRAVENOUS | Status: AC
  Administered 2015-05-17: 0.4 mg via INTRAVENOUS
  Filled 2015-05-17: qty 5

## 2015-05-17 MED ORDER — TECHNETIUM TC 99M SESTAMIBI GENERIC - CARDIOLITE
10.0000 | Freq: Once | INTRAVENOUS | Status: AC | PRN
  Administered 2015-05-17: 10 via INTRAVENOUS

## 2015-05-17 MED ORDER — REGADENOSON 0.4 MG/5ML IV SOLN
INTRAVENOUS | Status: AC
  Filled 2015-05-17: qty 5

## 2015-05-17 MED ORDER — TECHNETIUM TC 99M SESTAMIBI - CARDIOLITE
30.0000 | Freq: Once | INTRAVENOUS | Status: AC | PRN
  Administered 2015-05-17: 30 via INTRAVENOUS

## 2015-05-17 NOTE — Progress Notes (Signed)
  Echocardiogram 2D Echocardiogram has been performed.  Joelene Millin 05/17/2015, 12:34 PM

## 2015-05-17 NOTE — Progress Notes (Signed)
   [name] presented for a lexiscan cardiolite today.  No immediate complications.  Stress imaging pending.  Murray Hodgkins, NP 05/17/2015, 9:37 AM

## 2015-05-17 NOTE — Discharge Summary (Signed)
Brandy Townsend, is a 65 y.o. female  DOB 07-Jan-1951  MRN [id number].  Admission date:  05/16/2015  Admitting Physician  Debbe Odea, MD  Discharge Date:  05/17/2015   Primary MD  Paulo Fruit, MD  Recommendations for primary care physician for things to follow:    Monitor secondary risk factors for CAD   Admission Diagnosis  Precordial pain [R07.2] Angina pectoris ([place]) [I20.9]   Discharge Diagnosis  Precordial pain [R07.2] Angina pectoris ([place]) [I20.9]    Principal Problem:   Chest pain syndrome Active Problems:   HTN (hypertension)   Hyperthyroidism   Prerenal azotemia      Past Medical History  Diagnosis Date  . Hypertension   . Thyroid disease     History reviewed. No pertinent past surgical history.     HPI  from the history and physical done on the day of admission:    65 year old female patient with history of hypertension, hyperthyroidism and former tobacco abuse (quit 2011). Patient presents to the ER. Experiencing chest pain that awakened her from sleep. Patient reports that she awakened from sleep with significant chest pressure under her left breast that was nonradiating and had no other associating symptoms. She got up to check her blood pressure which was 180/100. The pain continued and she felt somewhat chilled. She thought it might be indigestion related to her potassium pills so she went to her computer to reference this. She rested somewhat thinking this may help her pain go away but it did not then the pain began to radiate towards her left shoulder although she feels this may be related to tension stating she has previously had a muscle injury in that area and when she tenses her right shoulder blade will hurt. Later the pain became more "rubber band-like" beneath both breasts again  with significant pressure. She relates that the pain was about a 7-8/10. At this point she became concerned awakened her husband and presented to the ER.  Patient recently started an exercise program and states she has been very active and utilizing the treadmill at running speeds without any chest pain or shortness of breath. She does report that because of her chronic sinus congestion and allergies that she does have some trouble breathing with activity especially if she tries to breathe through her nose. She reports that recently because of her sinus congestion she's been coughing quite a bit and thought that some of this pain may be related to muscle strain. Patient reports that since receiving treatment in the ER her pain has resolved. (Aspirin and nitroglycerin 3 doses)     Hospital Course:     1. Atypical chest pain. Resolved. Ruled out for MI with 3 negative troponins, EKG nonacute, Myoview without any reversible ischemia and preserved EF, echogram stable. Chest pain likely musculoskeletal due to prolonged coughing spells from ongoing URI. Will be discharged home on home medications.  2. Recent URI with cough. Continue outpatient regimen and follow with PCP.  3. Mild prerenal azotemia. Resolved completely. Resume  home dose diuretic, request PCP to monitor renal function in the outpatient setting.  4. Hyperthyroidism. On Tapazole continue. PCP to monitor TSH and free T4.  5. Essential hypertension. On verapamil continue.     Discharge Condition: Stable  Follow UP  Follow-up Information    Follow up with Paulo Fruit, MD. Schedule an appointment as soon as possible for a visit in 1 week.   Specialty:  Obstetrics and Gynecology   Contact information:   [address] [place] [address] [phone]       Follow up with Nahser, Wonda Cheng, MD. Schedule an appointment as soon as possible for a visit in 1 week.   Specialty:  Cardiology   Contact information:    [address] [place] [address] [phone]        Consults obtained - none  Diet and Activity recommendation: See Discharge Instructions below  Discharge Instructions      Discharge Instructions    Diet - low sodium heart healthy    Complete by:  As directed      Discharge instructions    Complete by:  As directed   Follow with Primary MD Paulo Fruit, MD in 7 days   Get CBC, CMP, 2 view Chest X ray checked  by Primary MD next visit.    Activity: As tolerated with Full fall precautions use walker/cane & assistance as needed   Disposition Home    Diet:   Heart Healthy .  For Heart failure patients - Check your Weight same time everyday, if you gain over 2 pounds, or you develop in leg swelling, experience more shortness of breath or chest pain, call your Primary MD immediately. Follow Cardiac Low Salt Diet and 1.5 lit/day fluid restriction.   On your next visit with your primary care physician please Get Medicines reviewed and adjusted.   Please request your Prim.MD to go over all Hospital Tests and Procedure/Radiological results at the follow up, please get all Hospital records sent to your Prim MD by signing hospital release before you go home.   If you experience worsening of your admission symptoms, develop shortness of breath, life threatening emergency, suicidal or homicidal thoughts you must seek medical attention immediately by calling 911 or calling your MD immediately  if symptoms less severe.  You Must read complete instructions/literature along with all the possible adverse reactions/side effects for all the Medicines you take and that have been prescribed to you. Take any new Medicines after you have completely understood and accpet all the possible adverse reactions/side effects.   Do not drive, operating heavy machinery, perform activities at heights, swimming or participation in water activities or provide baby sitting services if your  were admitted for syncope or siezures until you have seen by Primary MD or a Neurologist and advised to do so again.  Do not drive when taking Pain medications.    Do not take more than prescribed Pain, Sleep and Anxiety Medications  Special Instructions: If you have smoked or chewed Tobacco  in the last 2 yrs please stop smoking, stop any regular Alcohol  and or any Recreational drug use.  Wear Seat belts while driving.   Please note  You were cared for by a hospitalist during your hospital stay. If you have any questions about your discharge medications or the care you received while you were in the hospital after you are discharged, you can call the unit and asked to speak with  the hospitalist on call if the hospitalist that took care of you is not available. Once you are discharged, your primary care physician will handle any further medical issues. Please note that NO REFILLS for any discharge medications will be authorized once you are discharged, as it is imperative that you return to your primary care physician (or establish a relationship with a primary care physician if you do not have one) for your aftercare needs so that they can reassess your need for medications and monitor your lab values.     Increase activity slowly    Complete by:  As directed              Discharge Medications       Medication List    TAKE these medications        albuterol 108 (90 Base) MCG/ACT inhaler  Commonly known as:  PROVENTIL HFA;VENTOLIN HFA  Inhale into the lungs every 6 (six) hours as needed for wheezing or shortness of breath.     amoxicillin-clavulanate 875-125 MG tablet  Commonly known as:  AUGMENTIN  Take 1 tablet by mouth 2 (two) times daily.     azelastine 0.1 % nasal spray  Commonly known as:  ASTELIN  Place 1-2 sprays into both nostrils 2 (two) times daily as needed for rhinitis or allergies.     diphenhydrAMINE 25 MG tablet  Commonly known as:  BENADRYL  Take 25 mg  by mouth every 6 (six) hours as needed for itching.     fexofenadine 180 MG tablet  Commonly known as:  ALLEGRA  Take 180 mg by mouth daily.     KLOR-CON 10 10 MEQ tablet  Generic drug:  potassium chloride  Take 10 mEq by mouth daily.     methimazole 5 MG tablet  Commonly known as:  TAPAZOLE  Take 5 mg by mouth every morning.     triamterene-hydrochlorothiazide 37.5-25 MG capsule  Commonly known as:  DYAZIDE  TAKE ONE CAPSULE BY MOUTH EVERY DAY FOR FLUID     verapamil 240 MG CR tablet  Commonly known as:  CALAN-SR  Take 240 mg by mouth daily.        Major procedures and Radiology Reports - PLEASE review detailed and final reports for all details, in brief -   TTE  Left ventricle: The cavity size was normal. Wall thickness was normal. Systolic function was normal. The estimated ejection fraction was in the range of 60% to 65%. Wall motion was normal; there were no regional wall motion abnormalities. Doppler parameters are consistent with abnormal left ventricular relaxation (grade 1 diastolic dysfunction).   Dg Chest 2 View  05/16/2015  CLINICAL DATA:  Non radiating chest pressure with some shortness of breath. EXAM: CHEST  2 VIEW COMPARISON:  03/07/2014 FINDINGS: Normal heart size and pulmonary vascularity. No focal airspace disease or consolidation in the lungs. No blunting of costophrenic angles. No pneumothorax. Mediastinal contours appear intact. Mild degenerative changes in the spine. IMPRESSION: No active cardiopulmonary disease. Electronically Signed   By: Lucienne Capers M.D.   On: 05/16/2015 05:02   Nm Myocar Single W/spect W/wall Motion And Ef  05/17/2015  CLINICAL DATA:  Chest pain EXAM: MYOCARDIAL IMAGING WITH SPECT (REST AND PHARMACOLOGIC-STRESS) GATED LEFT VENTRICULAR WALL MOTION STUDY LEFT VENTRICULAR EJECTION FRACTION TECHNIQUE: Standard myocardial SPECT imaging was performed after resting intravenous injection of 10 mCi Tc-44m sestamibi. Subsequently,  intravenous infusion of Lexiscan was performed under the supervision of the Cardiology staff. At peak effect of  the drug, 30 mCi Tc-70m sestamibi was injected intravenously and standard myocardial SPECT imaging was performed. Quantitative gated imaging was also performed to evaluate left ventricular wall motion, and estimate left ventricular ejection fraction. COMPARISON:  None. FINDINGS: Perfusion: Allowing for diaphragmatic attenuation, there are no perfusion defects. Wall Motion: Normal left ventricular wall motion. No left ventricular dilation. Left Ventricular Ejection Fraction: 72 % End diastolic volume 58 ml End systolic volume 16 ml IMPRESSION: 1. No reversible ischemia or infarction. 2. Normal left ventricular wall motion. 3. Left ventricular ejection fraction 72% 4. Low-risk stress test findings*. *2012 Appropriate Use Criteria for Coronary Revascularization Focused Update: J Am Coll Cardiol. [id number]. http://content.airportbarriers.com.aspx?articleid=[email or web address] Electronically Signed   By: Marybelle Killings M.D.   On: 05/17/2015 12:09    Micro Results      No results found for this or any previous visit (from the past 240 hour(s)).     Today   Subjective    Brandy Townsend today has no headache,no chest abdominal pain,no new weakness tingling or numbness, feels much better wants to go home today.     Objective   Blood pressure 129/72, pulse 91, temperature 97.8 F (36.6 C), temperature source Oral, resp. rate 16, height 5\' 4"  (1.626 m), weight 70.5 kg (155 lb 6.8 oz), SpO2 97 %.  No intake or output data in the 24 hours ending 05/17/15 1228  Exam Awake Alert, Oriented x 3, No new F.N deficits, Normal affect [place].AT,PERRAL Supple Neck,No JVD, No cervical lymphadenopathy appriciated.  Symmetrical Chest wall movement, Good air movement bilaterally, CTAB RRR,No Gallops,Rubs or new Murmurs, No Parasternal Heave +ve B.Sounds, Abd Soft, Non tender, No organomegaly appriciated, No  rebound -guarding or rigidity. No Cyanosis, Clubbing or edema, No new Rash or bruise   Data Review   CBC w Diff:  Lab Results  Component Value Date   WBC 8.9 05/16/2015   HGB 13.5 05/16/2015   HCT 40.9 05/16/2015   PLT 338 05/16/2015    CMP:  Lab Results  Component Value Date   NA 141 05/16/2015   K 4.4 05/16/2015   CL 106 05/16/2015   CO2 26 05/16/2015   BUN 24* 05/16/2015   CREATININE 0.78 05/16/2015  .   Total Time in preparing paper work, data evaluation and todays exam - 35 minutes  Thurnell Lose M.D on 05/17/2015 at 12:28 PM  [facility]   Office  [phone]

## 2015-05-17 NOTE — Discharge Instructions (Signed)
Follow with Primary MD Paulo Fruit, MD in 7 days   Get CBC, CMP, 2 view Chest X ray checked  by Primary MD next visit.    Activity: As tolerated with Full fall precautions use walker/cane & assistance as needed   Disposition Home    Diet:   Heart Healthy .  For Heart failure patients - Check your Weight same time everyday, if you gain over 2 pounds, or you develop in leg swelling, experience more shortness of breath or chest pain, call your Primary MD immediately. Follow Cardiac Low Salt Diet and 1.5 lit/day fluid restriction.   On your next visit with your primary care physician please Get Medicines reviewed and adjusted.   Please request your Prim.MD to go over all Hospital Tests and Procedure/Radiological results at the follow up, please get all Hospital records sent to your Prim MD by signing hospital release before you go home.   If you experience worsening of your admission symptoms, develop shortness of breath, life threatening emergency, suicidal or homicidal thoughts you must seek medical attention immediately by calling 911 or calling your MD immediately  if symptoms less severe.  You Must read complete instructions/literature along with all the possible adverse reactions/side effects for all the Medicines you take and that have been prescribed to you. Take any new Medicines after you have completely understood and accpet all the possible adverse reactions/side effects.   Do not drive, operating heavy machinery, perform activities at heights, swimming or participation in water activities or provide baby sitting services if your were admitted for syncope or siezures until you have seen by Primary MD or a Neurologist and advised to do so again.  Do not drive when taking Pain medications.    Do not take more than prescribed Pain, Sleep and Anxiety Medications  Special Instructions: If you have smoked or chewed Tobacco  in the last 2 yrs please stop smoking, stop any regular  Alcohol  and or any Recreational drug use.  Wear Seat belts while driving.   Please note  You were cared for by a hospitalist during your hospital stay. If you have any questions about your discharge medications or the care you received while you were in the hospital after you are discharged, you can call the unit and asked to speak with the hospitalist on call if the hospitalist that took care of you is not available. Once you are discharged, your primary care physician will handle any further medical issues. Please note that NO REFILLS for any discharge medications will be authorized once you are discharged, as it is imperative that you return to your primary care physician (or establish a relationship with a primary care physician if you do not have one) for your aftercare needs so that they can reassess your need for medications and monitor your lab values.

## 2015-06-16 ENCOUNTER — Other Ambulatory Visit: Payer: Self-pay | Admitting: Obstetrics & Gynecology

## 2015-06-19 LAB — CYTOLOGY - PAP

## 2016-02-11 ENCOUNTER — Emergency Department (HOSPITAL_COMMUNITY): Payer: No Typology Code available for payment source

## 2016-02-11 ENCOUNTER — Encounter: Payer: Self-pay | Admitting: Cardiovascular Disease

## 2016-02-11 ENCOUNTER — Emergency Department (HOSPITAL_COMMUNITY)
Admission: EM | Admit: 2016-02-11 | Discharge: 2016-02-11 | Disposition: A | Discharge status: Home / Self Care | Payer: No Typology Code available for payment source | Attending: Emergency Medicine | Admitting: Emergency Medicine

## 2016-02-11 ENCOUNTER — Encounter (HOSPITAL_COMMUNITY): Payer: Self-pay | Admitting: Emergency Medicine

## 2016-02-11 DIAGNOSIS — I10 Essential (primary) hypertension: Secondary | ICD-10-CM | POA: Insufficient documentation

## 2016-02-11 DIAGNOSIS — R0602 Shortness of breath: Secondary | ICD-10-CM | POA: Insufficient documentation

## 2016-02-11 DIAGNOSIS — R002 Palpitations: Secondary | ICD-10-CM

## 2016-02-11 DIAGNOSIS — Z87891 Personal history of nicotine dependence: Secondary | ICD-10-CM | POA: Insufficient documentation

## 2016-02-11 DIAGNOSIS — R55 Syncope and collapse: Secondary | ICD-10-CM

## 2016-02-11 LAB — T4, FREE: FREE T4: 0.8 ng/dL (ref 0.61–1.12)

## 2016-02-11 LAB — BASIC METABOLIC PANEL
ANION GAP: 12 (ref 5–15)
BUN: 12 mg/dL (ref 6–20)
CALCIUM: 10 mg/dL (ref 8.9–10.3)
CHLORIDE: 103 mmol/L (ref 101–111)
CO2: 22 mmol/L (ref 22–32)
Creatinine, Ser: 0.85 mg/dL (ref 0.44–1.00)
GFR calc non Af Amer: >60 mL/min (ref >60)
Glucose, Bld: 128 mg/dL — ABNORMAL HIGH (ref 65–99)
Potassium: 3.4 mmol/L — ABNORMAL LOW (ref 3.5–5.1)
SODIUM: 137 mmol/L (ref 135–145)

## 2016-02-11 LAB — I-STAT TROPONIN, ED
TROPONIN I, POC: 0 ng/mL (ref 0.00–0.08)
Troponin i, poc: 0 ng/mL (ref 0.00–0.08)

## 2016-02-11 LAB — CBC
HCT: 42 % (ref 36.0–46.0)
HEMOGLOBIN: 14.5 g/dL (ref 12.0–15.0)
MCH: 29.9 pg (ref 26.0–34.0)
MCHC: 34.5 g/dL (ref 30.0–36.0)
MCV: 86.6 fL (ref 78.0–100.0)
Platelets: 360 10*3/uL (ref 150–400)
RBC: 4.85 MIL/uL (ref 3.87–5.11)
RDW: 13.3 % (ref 11.5–15.5)
WBC: 12.6 10*3/uL — AB (ref 4.0–10.5)

## 2016-02-11 LAB — TSH: TSH: 0.8 u[IU]/mL (ref 0.350–4.500)

## 2016-02-11 LAB — D-DIMER, QUANTITATIVE (NOT AT ARMC): D DIMER QUANT: 0.37 ug{FEU}/mL (ref 0.00–0.50)

## 2016-02-11 MED ORDER — SODIUM CHLORIDE 0.9 % IV BOLUS (SEPSIS)
1000.0000 mL | Freq: Once | INTRAVENOUS | Status: AC
  Administered 2016-02-11: 1000 mL via INTRAVENOUS

## 2016-02-11 MED ORDER — DIPHENHYDRAMINE HCL 50 MG/ML IJ SOLN
25.0000 mg | Freq: Once | INTRAMUSCULAR | Status: AC
  Administered 2016-02-11: 25 mg via INTRAVENOUS
  Filled 2016-02-11: qty 1

## 2016-02-11 MED ORDER — PROCHLORPERAZINE EDISYLATE 5 MG/ML IJ SOLN
5.0000 mg | Freq: Once | INTRAMUSCULAR | Status: AC
  Administered 2016-02-11: 5 mg via INTRAVENOUS
  Filled 2016-02-11: qty 2

## 2016-02-11 MED ORDER — POTASSIUM CHLORIDE CRYS ER 20 MEQ PO TBCR
40.0000 meq | EXTENDED_RELEASE_TABLET | Freq: Once | ORAL | Status: AC
  Administered 2016-02-11: 40 meq via ORAL
  Filled 2016-02-11: qty 2

## 2016-02-11 NOTE — ED Notes (Signed)
To ct

## 2016-02-11 NOTE — ED Notes (Signed)
To x-ray

## 2016-02-11 NOTE — ED Notes (Signed)
O2 turned off at this time per EDP request. O2 saturation of 96%.

## 2016-02-11 NOTE — ED Triage Notes (Signed)
Pt has had intermittent  Palpitations  For the last 3-4 days and on her fit bit she has noticed that her heart rate was 140, today she had an episode and then went to have  An iron level drawn it got better was  Baking and then had somne dizzines, which  She has never had before, she called 911, states no cp or nausea  but has had sob . She did eat cottage cheese this am and peaches this am, pt has hx of thyroid issues

## 2016-02-11 NOTE — ED Provider Notes (Signed)
[facility] DEPT  Note   CSN: [id number] Arrival date & time: 02/11/16  1219  History   Chief Complaint Chief Complaint  Patient presents with  . Near Syncope  . Tachycardia   HPI Brandy Townsend is a 65 y.o. female.   Near Syncope  This is a new problem. Associated symptoms include headaches and shortness of breath. Pertinent negatives include no chest pain.  Palpitations   This is a new problem. The current episode started more than 2 days ago. The problem occurs daily. The problem has not changed since onset.On average, each episode lasts 10 minutes. Associated symptoms include irregular heartbeat, near-syncope, headaches and shortness of breath. Pertinent negatives include no diaphoresis, no fever, no numbness, no chest pain, no chest pressure, no nausea, no vomiting, no leg pain, no lower extremity edema, no weakness, no hemoptysis and no sputum production. Associated symptoms comments: Near syncope. Her past medical history is significant for hyperthyroidism.   Past Medical History:  Diagnosis Date  . Hypertension   . Thyroid disease    Patient Active Problem List   Diagnosis Date Noted  . Chest pain syndrome 05/16/2015  . HTN (hypertension) 05/16/2015  . Hyperthyroidism 05/16/2015  . Prerenal azotemia 05/16/2015   History reviewed. No pertinent surgical history.  OB History    No data available     Home Medications    Prior to Admission medications   Medication Sig Start Date End Date Taking? Authorizing Provider  albuterol (PROVENTIL HFA;VENTOLIN HFA) 108 (90 BASE) MCG/ACT inhaler Inhale into the lungs every 6 (six) hours as needed for wheezing or shortness of breath.    Historical Provider, MD  amoxicillin-clavulanate (AUGMENTIN) 875-125 MG tablet Take 1 tablet by mouth 2 (two) times daily.    Historical Provider, MD  azelastine (ASTELIN) 0.1 % nasal spray Place 1-2 sprays into both nostrils 2 (two) times daily as needed for rhinitis or allergies.      Historical Provider, MD  diphenhydrAMINE (BENADRYL) 25 MG tablet Take 25 mg by mouth every 6 (six) hours as needed for itching.     Historical Provider, MD  fexofenadine (ALLEGRA) 180 MG tablet Take 180 mg by mouth daily.    Historical Provider, MD  KLOR-CON 10 10 MEQ tablet Take 10 mEq by mouth daily. 08/11/14   Historical Provider, MD  methimazole (TAPAZOLE) 5 MG tablet Take 5 mg by mouth every morning.     Historical Provider, MD  triamterene-hydrochlorothiazide (DYAZIDE) 37.5-25 MG per capsule TAKE ONE CAPSULE BY MOUTH EVERY DAY FOR FLUID 07/25/14   Historical Provider, MD  verapamil (CALAN-SR) 240 MG CR tablet Take 240 mg by mouth daily. 07/25/14   Historical Provider, MD    Family History No family history on file.  Social History Social History  Substance Use Topics  . Smoking status: Former Smoker    Quit date: 03/28/2009  . Smokeless tobacco: Never Used  . Alcohol use No     Allergies   Macrobid [nitrofurantoin monohyd macro]   Review of Systems Review of Systems  Constitutional: Negative for diaphoresis and fever.  Respiratory: Positive for shortness of breath. Negative for hemoptysis and sputum production.   Cardiovascular: Positive for palpitations and near-syncope. Negative for chest pain.  Gastrointestinal: Negative for nausea and vomiting.  Neurological: Positive for headaches. Negative for weakness and numbness.  All other systems reviewed and are negative.    Physical Exam Updated Vital Signs BP 139/88 (BP Location: Right Arm)   Pulse 88   Temp 97.9 F (36.6  C) (Oral)   Resp 22   SpO2 100%   Physical Exam  Constitutional: She is oriented to person, place, and time. She appears well-developed and well-nourished. No distress.  HENT:  Head: Normocephalic and atraumatic.  Eyes: EOM are normal. Pupils are equal, round, and reactive to light.  Neck: Normal range of motion.  Cardiovascular: Normal rate and regular rhythm.   Pulmonary/Chest: Effort normal and  breath sounds normal. No respiratory distress. She has no wheezes.  Abdominal: Soft. She exhibits no distension and no mass. There is no tenderness. There is no guarding.  Musculoskeletal: Normal range of motion. She exhibits no edema, tenderness or deformity.  No evidence of edema  Neurological: She is alert and oriented to person, place, and time.  Skin: Skin is warm. Capillary refill takes less than 2 seconds. She is not diaphoretic.  Psychiatric: She has a normal mood and affect. Her behavior is normal.  Nursing note and vitals reviewed.  ED Treatments / Results  Labs (all labs ordered are listed, but only abnormal results are displayed) Labs Reviewed  BASIC METABOLIC PANEL - Abnormal; Notable for the following:       Result Value   Potassium 3.4 (*)    Glucose, Bld 128 (*)    All other components within normal limits  CBC - Abnormal; Notable for the following:    WBC 12.6 (*)    All other components within normal limits  TSH  T4, FREE  D-DIMER, QUANTITATIVE (NOT AT [facility])  Randolm Idol, ED    EKG  EKG Interpretation  Date/Time:  Thursday February 11 2016 14:31:29 EST Ventricular Rate:  86 PR Interval:    QRS Duration: 81 QT Interval:  365 QTC Calculation: 437 R Axis:   75 Text Interpretation:  Sinus rhythm Borderline short PR interval Abnormal R-wave progression, early transition No significant change since last tracing Confirmed by FLOYD MD, DANIEL [phone]) on 02/11/2016 2:35:37 PM       Radiology Dg Chest 2 View  Result Date: 02/11/2016 CLINICAL DATA:  Palpitations EXAM: CHEST  2 VIEW COMPARISON:  None. FINDINGS: The heart size and mediastinal contours are within normal limits. Both lungs are clear. The visualized skeletal structures are unremarkable. IMPRESSION: No active cardiopulmonary disease. Electronically Signed   By: Kathreen Devoid   On: 02/11/2016 13:43    Procedures Procedures (including critical care time)  Medications Ordered in ED Medications -  No data to display   Initial Impression / Assessment and Plan / ED Course  I have reviewed the triage vital signs and the nursing notes.  Pertinent labs & imaging results that were available during my care of the patient were reviewed by me and considered in my medical decision making (see chart for details).  Clinical Course     Patient is a pleasant 65 year old female with past medical history of hyperthyroidism and hypertension who presents to emergency department today with palpitations that is present with activity. Patient states that for the past 4 days she has had palpitations when walking. She states these are relieved by rest. She also endorses shortness of breath but denies any cough, fever, hemoptysis, leg swelling, chest pain.  Patient has been taking her medications as directed and has no new medication changes. She denies any drug use. She denies any caffeine intake. Patient also denies any nausea, vomiting or diarrhea. She endorses good fluid intake. She also denies any melena or hematochezia. Patient endorsed a headache that was sudden in onset. No neuro deficits but  will order CT head to rule out acute bleed.   Possible hyperthyroidism. Patient short of breath but with clear lung sounds.  Chest x-ray shows no acute findings.   Low well's. D-dimer negative. Doubt PE.  TSH and T4 WNL  CT head WNL.   Patient observed and did well during her stay with one episode of tachycardia in low 100's which seemed to be with anxiety.   HEART score of 3.   Patient will obtain delta troponins.   Delta troponins negative.  Patient's headache has resolved.   Given her palpitations and reported high HR via fitbit, patient may benefit from outpatient holter monitor.  Patient discharged home with planned PCP follow up.   Final Clinical Impressions(s) / ED Diagnoses   Final diagnoses:  Near syncope  Palpitations     Roberto Scales, MD 02/11/16 [place],  DO 02/12/16 1054

## 2016-02-11 NOTE — ED Notes (Signed)
Up to ambulate to br to xray

## 2016-02-11 NOTE — ED Notes (Signed)
Pt reported shortness of breath. Lung sounds clear. Repeat EKG captured. Pt placed on 2L of O2 for comfort.

## 2016-02-15 NOTE — Progress Notes (Signed)
Cardiology Office Note   Date:  02/16/2016   ID:  Brandy Townsend, DOB 1950-11-05, MRN [id number]  PCP:  Dwan Bolt, MD  Cardiologist:   Jenkins Rouge, MD   Chief Complaint  Patient presents with  . Establish Care    some slight swelling in both feet.  . Other    Per pt, elevated HB, have felt off & color was off, Noticed HR elevated with both no activity & activity.      History of Present Illness: Brandy Townsend is a 65 y.o. female who presents for tachycardia and near syncope Seen in ER 11/16 with palpitations and dyspnea 4 days. Reports HR being high on fit bit. No chest pain Associated with headache Pain reproducable with palpation anterior neck. Prior to this had admission for chest pain 05/17/15.  Reviewed myovue diaphragmatic attenuation No ischemia EF 72%   Echo: normal EF 60-65%  05/17/15  CXR:  NAD no CE  02/11/16 Labs r/o with TSH .8 normal and normal free T4  LDL 93  Hct 41.5  02/11/16   She has asthma use to be on beta blocker for BP but not helpful.  Quit smoking 7 years ago but bronchitic Taken off asthminex by  [employer] and got worse Occasionally uses steroids   Past Medical History:  Diagnosis Date  . Hypertension   . Thyroid disease     History reviewed. No pertinent surgical history.   Current Outpatient Prescriptions  Medication Sig Dispense Refill  . albuterol (PROVENTIL HFA;VENTOLIN HFA) 108 (90 BASE) MCG/ACT inhaler Inhale 1 puff into the lungs every 6 (six) hours as needed for wheezing or shortness of breath.     . cetirizine (ZYRTEC) 10 MG tablet Take 10 mg by mouth at bedtime.    . fluticasone (FLONASE) 50 MCG/ACT nasal spray Place 1 spray into both nostrils daily.    Marland Kitchen KLOR-CON 10 10 MEQ tablet Take 10 mEq by mouth daily.  0  . methimazole (TAPAZOLE) 5 MG tablet Take 5 mg by mouth every morning.     . mometasone (ASMANEX) 220 MCG/INH inhaler Inhale 1 puff into the lungs daily.    Marland Kitchen triamterene-hydrochlorothiazide (DYAZIDE) 37.5-25 MG  per capsule TAKE ONE CAPSULE BY MOUTH EVERY DAY FOR FLUID  4  . verapamil (CALAN-SR) 240 MG CR tablet Take 240 mg by mouth daily.  12   No current facility-administered medications for this visit.     Allergies:   Macrobid [nitrofurantoin monohyd macro]    Social History:  The patient  reports that she quit smoking about 6 years ago. She has never used smokeless tobacco. She reports that she does not drink alcohol or use drugs.   Family History:  The patient's family history is not on file.    ROS:  Please see the history of present illness.   Otherwise, review of systems are positive for none.   All other systems are reviewed and negative.    PHYSICAL EXAM: VS:  BP 136/80 (BP Location: Right Arm, Patient Position: Sitting, Cuff Size: Normal)   Pulse 89   Ht 5\' 4"  (1.626 m)   Wt 72.1 kg (159 lb)   SpO2 98%   BMI 27.29 kg/m  , BMI Body mass index is 27.29 kg/m. Affect appropriate Healthy:  appears stated age 53: normal Neck supple with no adenopathy JVP normal no bruits no thyromegaly Lungs clear with no wheezing and good diaphragmatic motion Heart:  S1/S2 no murmur, no rub, gallop or click  PMI normal Abdomen: benighn, BS positve, no tenderness, no AAA no bruit.  No HSM or HJR Distal pulses intact with no bruits No edema Neuro non-focal Skin warm and dry No muscular weakness    EKG:  SR rate 85 normal 02/12/16    Recent Labs: 02/11/2016: BUN 12; Creatinine, Ser 0.85; Hemoglobin 14.5; Platelets 360; Potassium 3.4; Sodium 137; TSH 0.800    Lipid Panel    Component Value Date/Time   CHOL 162 05/17/2015 0343   TRIG 81 05/17/2015 0343   HDL 53 05/17/2015 0343   CHOLHDL 3.1 05/17/2015 0343   VLDL 16 05/17/2015 0343   LDLCALC 93 05/17/2015 0343      Wt Readings from Last 3 Encounters:  02/16/16 72.1 kg (159 lb)  05/17/15 70.5 kg (155 lb 6.8 oz)      Other studies Reviewed: Additional studies/ records that were reviewed today include: Primary notes  [facility] notes myovue , echo ECG and labs .    ASSESSMENT AND PLAN:  1.  Tachycardia/Palpitations benign likely related to stress, anxiety and asthma observe 2. Dyspnea Echo with normal EF no murmur likely related to previous smoking ans asthma consider f/u PFTls and lung testing  3. Chest Pain  Resolved normal stress test 04/2015 observe 4. HTN:  Continue diuretic and verapamil    Current medicines are reviewed at length with the patient today.  The patient does not have concerns regarding medicines.  The following changes have been made:  no change  Labs/ tests ordered today include: None  No orders of the defined types were placed in this encounter.    Disposition:   FU with me in 3 months      Signed, Jenkins Rouge, MD  02/16/2016 8:28 AM    [address], [place], [place]  [address] Phone: [phone]; Fax: [phone]

## 2016-02-16 ENCOUNTER — Ambulatory Visit (INDEPENDENT_AMBULATORY_CARE_PROVIDER_SITE_OTHER): Payer: No Typology Code available for payment source | Admitting: Cardiovascular Disease

## 2016-02-16 ENCOUNTER — Encounter: Payer: Self-pay | Admitting: Cardiovascular Disease

## 2016-02-16 VITALS — BP 136/80 | HR 89 | Ht 64.0 in | Wt 159.0 lb

## 2016-02-16 DIAGNOSIS — Z7689 Persons encountering health services in other specified circumstances: Secondary | ICD-10-CM | POA: Diagnosis not present

## 2016-02-16 NOTE — Patient Instructions (Signed)
Medication Instructions:  Your physician recommends that you continue on your current medications as directed. Please refer to the Current Medication list given to you today.   Labwork: None  Testing/Procedures: None  Follow-Up: Your physician recommends that you schedule a follow-up appointment in: 3 months with Dr. Johnsie Cancel.  Any Other Special Instructions Will Be Listed Below (If Applicable).     If you need a refill on your cardiac medications before your next appointment, please call your pharmacy.

## 2016-02-24 DIAGNOSIS — J309 Allergic rhinitis, unspecified: Secondary | ICD-10-CM | POA: Insufficient documentation

## 2016-02-24 DIAGNOSIS — J324 Chronic pansinusitis: Secondary | ICD-10-CM | POA: Insufficient documentation

## 2016-02-24 DIAGNOSIS — J45909 Unspecified asthma, uncomplicated: Secondary | ICD-10-CM | POA: Insufficient documentation

## 2016-02-24 DIAGNOSIS — H7291 Unspecified perforation of tympanic membrane, right ear: Secondary | ICD-10-CM | POA: Insufficient documentation

## 2016-02-24 DIAGNOSIS — J302 Other seasonal allergic rhinitis: Secondary | ICD-10-CM | POA: Insufficient documentation

## 2016-03-23 ENCOUNTER — Ambulatory Visit (INDEPENDENT_AMBULATORY_CARE_PROVIDER_SITE_OTHER): Payer: No Typology Code available for payment source | Admitting: Internal Medicine

## 2016-03-23 ENCOUNTER — Encounter: Payer: Self-pay | Admitting: Internal Medicine

## 2016-03-23 VITALS — BP 130/70 | HR 78 | Ht 63.58 in | Wt 162.6 lb

## 2016-03-23 DIAGNOSIS — J4540 Moderate persistent asthma, uncomplicated: Secondary | ICD-10-CM | POA: Insufficient documentation

## 2016-03-23 DIAGNOSIS — J320 Chronic maxillary sinusitis: Secondary | ICD-10-CM | POA: Diagnosis not present

## 2016-03-23 DIAGNOSIS — J4530 Mild persistent asthma, uncomplicated: Secondary | ICD-10-CM | POA: Diagnosis not present

## 2016-03-23 LAB — NITRIC OXIDE: NITRIC OXIDE: 33

## 2016-03-23 MED ORDER — MOMETASONE FURO-FORMOTEROL FUM 200-5 MCG/ACT IN AERO: INHALATION_SPRAY | RESPIRATORY_TRACT | 11 refills | Status: DC

## 2016-03-23 MED ORDER — MOMETASONE FURO-FORMOTEROL FUM 200-5 MCG/ACT IN AERO: 2.0000 | INHALATION_SPRAY | Freq: Two times a day (BID) | RESPIRATORY_TRACT | 0 refills | Status: DC

## 2016-03-23 NOTE — Progress Notes (Signed)
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 Pulmonary Consult    Consult: Dr. Wilson Singer, intermittent chest tightness, no wheezing or cough   wheezng started roughly the same year she quit smoking assoc with wt gain x 35lb wt gain typically better p prednisone and better since spring 2017  On asthmanex 2 each am with rare saba needed but then Feb 10 2016 chest tightness and did not try saba > ER 02/21/16 with w/u neg w/u except for a/f levels both max sinuses> Rx Rosen= prednsione > back to baseline at time of ov with no cough or sob or chest tightness and  Not limited by breathing from desired activities including treadmill at 3.5 mph flat x 1.5 miles   Other than assoc of flares with cold/sinus infections >> No obvious patterns in day to day or daytime variability or assoc excess/ purulent sputum or mucus plugs or hemoptysis or cp or chest tightness, subjective wheeze or overt   hb symptoms. No unusual exp hx or h/o childhood pna/ asthma or knowledge of premature birth.  Sleeping ok without nocturnal  or early am exacerbation  of respiratory  c/o's or need for noct saba. Also denies any obvious fluctuation of symptoms with weather or environmental changes or other aggravating or alleviating factors except as outlined above   Current Medications, Allergies, Complete Past Medical History, Past Surgical History, Family History, and Social History were reviewed in [employer] record.  ROS  The following are not active  complaints unless bolded sore throat, dysphagia, dental problems, itching, sneezing,  nasal congestion on excess/ purulent secretions, ear ache,   fever, chills, sweats, unintended wt loss, classically pleuritic or exertional cp,  orthopnea pnd or leg swelling, presyncope, palpitations, abdominal pain, anorexia, nausea, vomiting, diarrhea  or change in bowel or bladder habits, change in stools or urine, dysuria,hematuria,  rash, arthralgias, visual complaints, headache, numbness, weakness or ataxia or problems with walking or coordination,  change in mood/affect or memory.          Review of Systems     Objective:   Physical Exam     amb slt hoarse wf nad  Wt Readings from Last 3 Encounters:  03/23/16 162 lb 9.6 oz (73.8 kg)  02/16/16 159 lb (72.1 kg)  05/17/15 155 lb 6.8 oz (70.5 kg)    Vital signs reviewed  - Note on arrival 02 sats  98% on RA    HEENT: nl dentition, turbinates, and oropharynx. Nl external ear canals without cough reflex/ severe scarring R TM    NECK :  without JVD/Nodes/TM/ nl carotid upstrokes bilaterally   LUNGS: no acc muscle use,  Nl contour chest which is clear to A and P bilaterally without cough on insp or exp maneuvers   CV:  RRR  no s3 or murmur or increase in P2, nad no edema   ABD:  soft and nontender with nl inspiratory excursion in the supine position. No  bruits or organomegaly appreciated, bowel sounds nl  MS:  Nl gait/ ext warm without deformities, calf tenderness, cyanosis or clubbing No obvious joint restrictions   SKIN: warm and dry without lesions    NEURO:  alert, approp, nl sensorium with  no motor or cerebellar deficits apparent.      I personally reviewed images and agree with radiology impression as follows:  CT 02/11/16  Head: Sinuses/Orbits: Extensive mucosal edema in the paranasal sinuses with air-fluid levels in the maxillary sinus bilaterally. Normal Orbit    I personally reviewed images and agree with radiology  impression as follows:  CXR:   02/11/16 No active cardiopulmonary disease.      Assessment:

## 2016-03-23 NOTE — Patient Instructions (Signed)
Stop asthmanex   Plan A = Automatic = dulera 200 one puff each am and a second puff 12 hours later  - take 2 every 12 hours if any symptoms worsen   Plan B = Backup Only use your albuterol (proair) as a rescue medication to be used if you can't catch your breath by resting or doing a relaxed purse lip breathing pattern.  - The less you use it, the better it will work when you need it. - Ok to use the inhaler up to 2 puffs  every 4 hours if you must but call for appointment if use goes up over your usual need - Don't leave home without it !!  (think of it like the spare tire for your car)   Please schedule a follow up visit in 3 months but call sooner if needed with full pfts

## 2016-03-24 DIAGNOSIS — J320 Chronic maxillary sinusitis: Secondary | ICD-10-CM | POA: Insufficient documentation

## 2016-03-24 NOTE — Assessment & Plan Note (Signed)
See head ct 02/11/16 > f/u [name] planned

## 2016-03-24 NOTE — Assessment & Plan Note (Addendum)
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 and though she feels fine now, she still has mild to moderate airflow obstruction with episodic severe symptoms resulting in a recent ER trip   DDX of  difficult airways management almost all start with A and  include Adherence, Ace Inhibitors, Acid Reflux, Active Sinus Disease, Alpha 1 Antitripsin deficiency, Anxiety masquerading as Airways dz,  ABPA,  Allergy(esp in young), Aspiration (esp in elderly), Adverse effects of meds,  Active smokers, A bunch of PE's (a small clot burden can't cause this syndrome unless there is already severe underlying pulm or vascular dz with poor reserve) plus two Bs  = Bronchiectasis and Beta blocker use..and one C= CHF  Adherence is always the initial "prime suspect" and is a multilayered concern that requires a "trust but verify" approach in every patient - starting with knowing how to use medications, especially inhalers, correctly, keeping up with refills and understanding the fundamental difference between maintenance and prns vs those medications only taken for a very short course and then stopped and not refilled.  - see hfa teaching - says can't tol the palpitations with saba so start with dulera 200 one bid  - doesn't really understand concept of rescue rx / reviewed   ? Active sinus dz > clearly better p rx with Brandy Townsend > f/u planned   ? Allergy component > will explore at next ov but doubt it based on hx  ? Alpha one/ ? ABPA > needs labs next ov with full pfts to sort this all out   Total time devoted to counseling  > 50 % of 69 m initial office visit:  review case with pt/ discussion of options/alternatives/ personally creating written customized instructions  in presence of pt  then  going over those specific  Instructions directly with the pt including how to use all of the meds but in particular covering each new medication in detail and the difference between the maintenance/automatic meds and the prns using an action plan format for the latter.  Please see AVS from this visit for a full list of these instructions

## 2016-03-29 ENCOUNTER — Telehealth: Payer: Self-pay | Admitting: Internal Medicine

## 2016-03-29 MED ORDER — BUDESONIDE-FORMOTEROL FUMARATE 160-4.5 MCG/ACT IN AERO: 2.0000 | INHALATION_SPRAY | Freq: Two times a day (BID) | RESPIRATORY_TRACT | 11 refills | Status: DC

## 2016-03-29 NOTE — Telephone Encounter (Signed)
symbicort 160 2bid and if not covered give 2 weeks then ov with formulary in hand to chose alternative and teach new technique

## 2016-03-29 NOTE — Telephone Encounter (Signed)
pts insurance will not cover the dulera.  Do you want the PA to be initiated or do you want to change this medication.  Thanks  Allergies  Allergen Reactions  . Macrobid [employer] Macro] Other (See Comments)    Lowered potassium, caused aches and pains

## 2016-03-29 NOTE — Telephone Encounter (Signed)
Spoke with the pt and notified of recs per MW  She verbalized understanding  Rx was sent to pharm and she will call for sample and 2 wk rov if needed

## 2016-04-19 ENCOUNTER — Telehealth: Payer: Self-pay | Admitting: Internal Medicine

## 2016-04-19 NOTE — Telephone Encounter (Signed)
Samples have been left at the front for pick up. Pt is aware that Symbicort can't make you gain weight per RB. She is also aware that the samples are ready for pick up. Nothing further was needed.

## 2016-05-05 NOTE — Progress Notes (Signed)
Cardiology Office Note   Date:  05/18/2016   ID:  Brandy Townsend, DOB 03-10-1951, MRN [id number]  PCP:  Dwan Bolt, MD  Cardiologist:   Jenkins Rouge, MD   Chief Complaint  Patient presents with  . 3 month f/u      History of Present Illness: Brandy Townsend is a 66 y.o. female who presents for tachycardia and near syncope Seen in ER 11/16 with palpitations and dyspnea 4 days. Reports HR being high on fit bit. No chest pain Associated with headache Pain reproducable with palpation anterior neck. Prior to this had admission for chest pain 05/17/15.  Reviewed myovue diaphragmatic attenuation No ischemia EF 72%   Echo: normal EF 60-65%  05/17/15  CXR:  NAD no CE  02/11/16 Labs r/o with TSH .8 normal and normal free T4  LDL 93  Hct 41.5  02/11/16   She has asthma use to be on beta blocker for BP but not helpful.  Quit smoking 8 years ago but bronchitic Taken off asthminex by  [employer] and got worse Occasionally uses steroids   Issues with symbicort causing carpal tunnel like symptoms   Past Medical History:  Diagnosis Date  . Hypertension   . Thyroid disease     History reviewed. No pertinent surgical history.   Current Outpatient Prescriptions  Medication Sig Dispense Refill  . albuterol (PROVENTIL HFA;VENTOLIN HFA) 108 (90 BASE) MCG/ACT inhaler Inhale 1 puff into the lungs every 6 (six) hours as needed for wheezing or shortness of breath.     . budesonide-formoterol (SYMBICORT) 160-4.5 MCG/ACT inhaler Inhale 2 puffs into the lungs 2 (two) times daily. 1 Inhaler 11  . cetirizine (ZYRTEC) 10 MG tablet Take 10 mg by mouth at bedtime.    . fluticasone (FLONASE) 50 MCG/ACT nasal spray Place 1 spray into both nostrils daily.    Marland Kitchen KLOR-CON 10 10 MEQ tablet Take 10 mEq by mouth daily.  0  . meloxicam (MOBIC) 7.5 MG tablet Take 1 tablet (7.5 mg total) by mouth 2 (two) times daily as needed for pain. 30 tablet 2  . methimazole (TAPAZOLE) 5 MG tablet Take 5 mg by mouth every  morning.     . torsemide (DEMADEX) 20 MG tablet Take 20 mg by mouth every other day.  0  . triamterene-hydrochlorothiazide (DYAZIDE) 37.5-25 MG per capsule TAKE ONE CAPSULE BY MOUTH EVERY DAY FOR FLUID  4  . verapamil (CALAN-SR) 240 MG CR tablet Take 240 mg by mouth daily.  12   No current facility-administered medications for this visit.     Allergies:   Macrobid [nitrofurantoin monohyd macro]    Social History:  The patient  reports that she quit smoking about 7 years ago. She has never used smokeless tobacco. She reports that she does not drink alcohol or use drugs.   Family History:  The patient's family history is not on file.    ROS:  Please see the history of present illness.   Otherwise, review of systems are positive for none.   All other systems are reviewed and negative.    PHYSICAL EXAM: VS:  BP 120/70   Pulse 74   Ht 5\' 4"  (1.626 m)   Wt 157 lb (71.2 kg)   SpO2 98%   BMI 26.95 kg/m  , BMI Body mass index is 26.95 kg/m. Affect appropriate Healthy:  appears stated age 9: normal Neck supple with no adenopathy JVP normal no bruits no thyromegaly Lungs clear with no wheezing and  good diaphragmatic motion Heart:  S1/S2 no murmur, no rub, gallop or click PMI normal Abdomen: benighn, BS positve, no tenderness, no AAA no bruit.  No HSM or HJR Distal pulses intact with no bruits No edema Neuro non-focal Skin warm and dry No muscular weakness    EKG:  SR rate 85 normal 02/12/16    Recent Labs: 02/11/2016: BUN 12; Creatinine, Ser 0.85; Hemoglobin 14.5; Platelets 360; Potassium 3.4; Sodium 137; TSH 0.800    Lipid Panel    Component Value Date/Time   CHOL 162 05/17/2015 0343   TRIG 81 05/17/2015 0343   HDL 53 05/17/2015 0343   CHOLHDL 3.1 05/17/2015 0343   VLDL 16 05/17/2015 0343   LDLCALC 93 05/17/2015 0343      Wt Readings from Last 3 Encounters:  05/18/16 157 lb (71.2 kg)  03/23/16 162 lb 9.6 oz (73.8 kg)  02/16/16 159 lb (72.1 kg)       Other studies Reviewed: Additional studies/ records that were reviewed today include: Primary notes [facility] notes myovue , echo ECG and labs .    ASSESSMENT AND PLAN:  1.  Tachycardia/Palpitations benign likely related to stress, anxiety and asthma observe 2. Dyspnea Echo with normal EF no murmur likely related to previous smoking and asthma  3. Chest Pain  Resolved normal stress test 04/2015 observe 4. HTN:  Continue diuretic and verapamil  5. Asthma  F/u Wert thinks symbicort causing pain in wrists   Current medicines are reviewed at length with the patient today.  The patient does not have concerns regarding medicines.  The following changes have been made:  no change  Labs/ tests ordered today include: None  No orders of the defined types were placed in this encounter.    Disposition:   FU with me in a year      Signed, Jenkins Rouge, MD  05/18/2016 8:18 AM    [facility] [place], [place], [place]  [address] Phone: [phone]; Fax: [phone]

## 2016-05-12 ENCOUNTER — Ambulatory Visit (INDEPENDENT_AMBULATORY_CARE_PROVIDER_SITE_OTHER): Payer: No Typology Code available for payment source | Admitting: Orthopaedic Surgery

## 2016-05-12 ENCOUNTER — Encounter (INDEPENDENT_AMBULATORY_CARE_PROVIDER_SITE_OTHER): Payer: Self-pay | Admitting: Orthopaedic Surgery

## 2016-05-12 DIAGNOSIS — G8929 Other chronic pain: Secondary | ICD-10-CM | POA: Insufficient documentation

## 2016-05-12 DIAGNOSIS — M25512 Pain in left shoulder: Secondary | ICD-10-CM | POA: Diagnosis not present

## 2016-05-12 DIAGNOSIS — M79642 Pain in left hand: Secondary | ICD-10-CM | POA: Diagnosis not present

## 2016-05-12 DIAGNOSIS — M79641 Pain in right hand: Secondary | ICD-10-CM | POA: Insufficient documentation

## 2016-05-12 MED ORDER — MELOXICAM 7.5 MG PO TABS: 7.5000 mg | ORAL_TABLET | Freq: Two times a day (BID) | ORAL | 2 refills | Status: DC | PRN

## 2016-05-12 MED ORDER — LIDOCAINE HCL 1 % IJ SOLN
3.0000 mL | INTRAMUSCULAR | Status: AC | PRN
  Administered 2016-05-12: 3 mL

## 2016-05-12 MED ORDER — METHYLPREDNISOLONE ACETATE 40 MG/ML IJ SUSP
40.0000 mg | INTRAMUSCULAR | Status: AC | PRN
  Administered 2016-05-12: 40 mg via INTRA_ARTICULAR

## 2016-05-12 MED ORDER — BUPIVACAINE HCL 0.5 % IJ SOLN
3.0000 mL | INTRAMUSCULAR | Status: AC | PRN
  Administered 2016-05-12: 3 mL via INTRA_ARTICULAR

## 2016-05-12 NOTE — Progress Notes (Signed)
Office Visit Note   Patient: Brandy Townsend           Date of Birth: [birth year]           MRN: [id number] Visit Date: 05/12/2016              Requested by: Anda Kraft, MD [address] [place], [place] [address] PCP: Dwan Bolt, MD   Assessment & Plan: Visit Diagnoses:  1. Pain in both hands   2. Chronic left shoulder pain     Plan: Another subacromial injection was performed today hopefully this will give her some good relief. If her pain comes back I think it's best that we get an MRI to fully evaluate the shoulder. 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 Modic was prescribed. She is going to see if stopping Symbicort is going to make a difference or not.  Follow-Up Instructions: Return if symptoms worsen or fail to improve.   Orders:  No orders of the defined types were placed in this encounter.  Meds ordered this encounter  Medications  . meloxicam (MOBIC) 7.5 MG tablet    Sig: Take 1 tablet (7.5 mg total) by mouth 2 (two) times daily as needed for pain.    Dispense:  30 tablet    Refill:  2      Procedures: Large Joint Inj Date/Time: 05/12/2016 8:14 PM Performed by: Leandrew Koyanagi Authorized by: Leandrew Koyanagi   Consent Given by:  Patient Timeout: prior to procedure the correct patient, procedure, and site was verified   Location:  Shoulder Site:  L subacromial bursa Prep: patient was prepped and draped in usual sterile fashion   Needle Size:  22 G Approach:  Posterior Ultrasound Guidance: No   Fluoroscopic Guidance: No   Arthrogram: No   Medications:  3 mL lidocaine 1 %; 3 mL bupivacaine 0.5 %; 40 mg methylPREDNISolone acetate 40 MG/ML     Clinical Data: No additional findings.   Subjective: Chief Complaint  Patient presents with  . Left Shoulder - Pain    Patient comes in today for her left shoulder pain. She did have an injection back in 6 negative relief but has now started to hurt again especially when she  abducts her shoulder to the level of the shoulder and worse with sleeping. She has pain when she does certain things. She is also complaining of bilateral hand pain and cramping worse in the morning and trouble with opening pill bottles and jars. She denies any significant numbness or tingling in the fingers. She thinks that maybe there is a correlation between her Symbicort and the symptoms.    Review of Systems Please review of systems negative except for history of present illness  Objective: Vital Signs: There were no vitals taken for this visit.  Physical Exam  Constitutional: She is oriented to person, place, and time. She appears well-developed and well-nourished.  Pulmonary/Chest: Effort normal.  Neurological: She is alert and oriented to person, place, and time.  Skin: Skin is warm. Capillary refill takes less than 2 seconds.  Psychiatric: She has a normal mood and affect. Her behavior is normal. Judgment and thought content normal.  Nursing note and vitals reviewed.   Ortho Exam Exam of the left shoulder shows positive impingement signs and mildly positive empty can testing. Rotator cuff function is grossly intact but with slight pain. Exam of bilateral hands shows negative carpal tunnel compression signs. There is no joint swelling. There  is no crepitus. Normal range of motion of the fingers. Specialty Comments:  No specialty comments available.  Imaging: No results found.   PMFS History: Patient Active Problem List   Diagnosis Date Noted  . Pain in both hands 05/12/2016  . Chronic left shoulder pain 05/12/2016  . Chronic sinusitis of both maxillary sinuses 03/24/2016  . 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 Chest pain syndrome 05/16/2015  . HTN (hypertension) 05/16/2015  . Hyperthyroidism 05/16/2015  . Prerenal azotemia 05/16/2015   Past Medical History:  Diagnosis Date  . Hypertension   . Thyroid disease     No family history on  file.  No past surgical history on file. Social History   Occupational History  . Not on file.   Social History Main Topics  . Smoking status: Former Smoker    Quit date: 03/28/2009  . Smokeless tobacco: Never Used  . Alcohol use No  . Drug use: No  . Sexual activity: Not on file

## 2016-05-18 ENCOUNTER — Other Ambulatory Visit: Payer: Self-pay

## 2016-05-18 ENCOUNTER — Encounter: Payer: Self-pay | Admitting: Cardiovascular Disease

## 2016-05-18 ENCOUNTER — Ambulatory Visit (INDEPENDENT_AMBULATORY_CARE_PROVIDER_SITE_OTHER): Payer: No Typology Code available for payment source | Admitting: Cardiovascular Disease

## 2016-05-18 VITALS — BP 120/70 | HR 74 | Ht 64.0 in | Wt 157.0 lb

## 2016-05-18 DIAGNOSIS — I10 Essential (primary) hypertension: Secondary | ICD-10-CM | POA: Diagnosis not present

## 2016-05-18 NOTE — Patient Instructions (Signed)

## 2016-05-19 ENCOUNTER — Ambulatory Visit (INDEPENDENT_AMBULATORY_CARE_PROVIDER_SITE_OTHER): Payer: No Typology Code available for payment source | Admitting: Orthopaedic Surgery

## 2016-06-02 ENCOUNTER — Other Ambulatory Visit: Payer: Self-pay | Admitting: Endocrinology

## 2016-06-02 DIAGNOSIS — E049 Nontoxic goiter, unspecified: Secondary | ICD-10-CM

## 2016-06-15 ENCOUNTER — Other Ambulatory Visit: Payer: No Typology Code available for payment source

## 2016-06-15 ENCOUNTER — Ambulatory Visit
Admission: RE | Admit: 2016-06-15 | Discharge: 2016-06-15 | Disposition: A | Discharge status: Home / Self Care | Payer: No Typology Code available for payment source | Source: Ambulatory Visit | Attending: Endocrinology | Admitting: Endocrinology

## 2016-06-15 DIAGNOSIS — E049 Nontoxic goiter, unspecified: Secondary | ICD-10-CM

## 2016-06-20 ENCOUNTER — Ambulatory Visit (INDEPENDENT_AMBULATORY_CARE_PROVIDER_SITE_OTHER): Payer: No Typology Code available for payment source

## 2016-06-20 ENCOUNTER — Encounter (INDEPENDENT_AMBULATORY_CARE_PROVIDER_SITE_OTHER): Payer: Self-pay | Admitting: Orthopaedic Surgery

## 2016-06-20 ENCOUNTER — Ambulatory Visit (INDEPENDENT_AMBULATORY_CARE_PROVIDER_SITE_OTHER): Payer: No Typology Code available for payment source | Admitting: Orthopaedic Surgery

## 2016-06-20 DIAGNOSIS — M25532 Pain in left wrist: Secondary | ICD-10-CM

## 2016-06-20 NOTE — Progress Notes (Signed)
   Office Visit Note   Patient: Brandy Townsend           Date of Birth: 1950-12-30           MRN: [id number] Visit Date: 06/20/2016              Requested by: Anda Kraft, MD [address] [place] [address] PCP: Dwan Bolt, MD   Assessment & Plan: Visit Diagnoses:  1. Pain in left wrist     Plan: I recommend conservative treatment given the acute onset just couple days ago. I recommend immobilization with NSAID ice and oral NSAIDs. If not better, consider aspiration and injection.  Follow-Up Instructions: Return if symptoms worsen or fail to improve.   Orders:  Orders Placed This Encounter  Procedures  . XR Wrist Complete Left   No orders of the defined types were placed in this encounter.     Procedures: No procedures performed   Clinical Data: No additional findings.   Subjective: Chief Complaint  Patient presents with  . Left Wrist - Pain    Patient is a 66 year old female with acute onset of left dorsal wrist cyst with pain with flexion of the fingers. She is unable to grab her steering wheel.  Denies any injuries. It is painful to palpation. She's been taking Advil. Denies any constitutional symptoms    Review of Systems  Constitutional: Negative.   HENT: Negative.   Eyes: Negative.   Respiratory: Negative.   Cardiovascular: Negative.   Endocrine: Negative.   Musculoskeletal: Negative.   Neurological: Negative.   Hematological: Negative.   Psychiatric/Behavioral: Negative.   All other systems reviewed and are negative.    Objective: Vital Signs: There were no vitals taken for this visit.  Physical Exam  Constitutional: She is oriented to person, place, and time. She appears well-developed and well-nourished.  Pulmonary/Chest: Effort normal.  Neurological: She is alert and oriented to person, place, and time.  Skin: Skin is warm. Capillary refill takes less than 2 seconds.  Psychiatric: She has a normal mood and  affect. Her behavior is normal. Judgment and thought content normal.  Nursing note and vitals reviewed.   Ortho Exam Left wrist exam shows a dorsal firm semimobile cyst overlying the distal ulna. There are no skin changes or signs of infection.Marland Kitchen Specialty Comments:  No specialty comments available.  Imaging: Xr Wrist Complete Left  Result Date: 06/20/2016 No acute findings    PMFS History: Patient Active Problem List   Diagnosis Date Noted  . Pain in both hands 05/12/2016  . Chronic left shoulder pain 05/12/2016  . Chronic sinusitis of both maxillary sinuses 03/24/2016  . 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 Chest pain syndrome 05/16/2015  . HTN (hypertension) 05/16/2015  . Hyperthyroidism 05/16/2015  . Prerenal azotemia 05/16/2015   Past Medical History:  Diagnosis Date  . Hypertension   . Thyroid disease     No family history on file.  No past surgical history on file. Social History   Occupational History  . Not on file.   Social History Main Topics  . Smoking status: Former Smoker    Quit date: 03/28/2009  . Smokeless tobacco: Never Used  . Alcohol use No  . Drug use: No  . Sexual activity: Not on file

## 2016-06-21 ENCOUNTER — Ambulatory Visit: Payer: No Typology Code available for payment source | Admitting: Internal Medicine

## 2016-07-14 ENCOUNTER — Encounter: Payer: Self-pay | Admitting: Internal Medicine

## 2016-07-14 ENCOUNTER — Ambulatory Visit (INDEPENDENT_AMBULATORY_CARE_PROVIDER_SITE_OTHER): Payer: No Typology Code available for payment source | Admitting: Internal Medicine

## 2016-07-14 VITALS — BP 118/82 | HR 90 | Ht 64.0 in | Wt 157.6 lb

## 2016-07-14 DIAGNOSIS — J4540 Moderate persistent asthma, uncomplicated: Secondary | ICD-10-CM | POA: Diagnosis not present

## 2016-07-14 MED ORDER — BUDESONIDE-FORMOTEROL FUMARATE 80-4.5 MCG/ACT IN AERO
INHALATION_SPRAY | RESPIRATORY_TRACT | 11 refills | Status: DC
Start: 2016-07-14 — End: 2017-05-10

## 2016-07-14 MED ORDER — ALBUTEROL SULFATE HFA 108 (90 BASE) MCG/ACT IN AERS: INHALATION_SPRAY | RESPIRATORY_TRACT | 1 refills | Status: DC

## 2016-07-14 NOTE — Assessment & Plan Note (Signed)
-    Spirometry 03/23/2016  FEV1 1.59 (65%)  Ratio 66   -  FENO 03/23/2016  =   33  - 03/23/2016    try dulera 200 one bid to increase to 2 bid with symptoms > changed to symbicort by insurance - 07/14/2016  After extensive coaching HFA effectiveness =    75% from baseline 50%   On such a low dose of symbicort doubt it's helping and also doubt it's causing any joint stiffness so ok to try off and use prn   I had an extended discussion with the patient reviewing all relevant studies completed to date and  lasting 15 to 20 minutes of a 25 minute visit    Each maintenance medication was reviewed in detail including most importantly the difference between maintenance and prns and under what circumstances the prns are to be triggered using an action plan format that is not reflected in the computer generated alphabetically organized AVS.    Please see AVS for specific instructions unique to this visit that I personally wrote and verbalized to the the pt in detail and then reviewed with pt  by my nurse highlighting any  changes in therapy recommended at today's visit to their plan of care.

## 2016-07-14 NOTE — Patient Instructions (Addendum)
Stop symbicort to see what difference it makes and if breathing worse start back   If hands only both you while on symbicort we need to find you an alternatives   Only use your albuterol as a rescue medication to be used if you can't catch your breath by resting or doing a relaxed purse lip breathing pattern.  - The less you use it, the better it will work when you need it. - Ok to use up to 2 puffs  every 4 hours if you must but call for immediate appointment if use goes up over your usual need - Don't leave home without it !!  (think of it like the spare tire for your car)    If not satisfied return with your formulary list of alternatives    Please schedule a follow up visit in 6 months but call sooner if needed with pfts on return

## 2016-07-14 NOTE — Progress Notes (Signed)
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 Pulmonary Consult    Consult: Dr. Wilson Singer, intermittent chest tightness, no wheezing or cough   wheezng started roughly the same year she quit smoking assoc with wt gain x 35lb wt gain typically better p prednisone and better since spring 2017  On asthmanex 2 each am with rare saba needed but then Feb 10 2016 chest tightness and did not try saba > ER 02/21/16 with w/u neg w/u except for a/f levels both max sinuses> Rx Rosen= prednsione > back to baseline at time of ov with no cough or sob or chest tightness and  Not limited by breathing from desired activities including treadmill at 3.5 mph flat x 1.5 miles  Other than assoc of flares with cold/sinus infections >> No obvious patterns in day to day or daytime variability or assoc excess/ purulent sputum or mucus plugs or hemoptysis or cp or chest tightness, subjective wheeze or overt   hb symptoms. No unusual exp hx or h/o childhood pna/ asthma or knowledge of premature birth. rec Stop asthmanex  Plan A = Automatic = dulera 200 one puff each am and a second puff 12 hours later  - take 2 every 12 hours if any symptoms worsen  Plan B = Backup Only use your albuterol (proair) as a rescue medication   Please schedule a follow up visit in 3 months but call sooner if needed with full pfts       07/14/2016  f/u ov/Brandy Townsend re: acos vs copd II only using symb 160 one each am  Chief Complaint  Patient  presents with  . Follow-up    Pt states that her breathing has been doing well since last OV.  No new complaints. Pt notes joint pain in hands x 3 months -- could this be from the Symbicort  generalized stiffness both hands gradual onset persistent daily despite cutting back on symb Not needing rescue at all    No obvious day to day or daytime variability or assoc excess/ purulent sputum or mucus plugs or hemoptysis or cp or chest tightness, subjective wheeze or overt sinus or hb symptoms. No unusual exp hx or h/o childhood pna/ asthma or knowledge of premature birth.  Sleeping ok without nocturnal  or early am exacerbation  of respiratory  c/o's or need for noct saba. Also denies any obvious fluctuation of symptoms with weather or environmental changes or other aggravating or alleviating factors except as outlined above   Current Medications, Allergies, Complete Past Medical History, Past Surgical History, Family History, and Social History were reviewed in [employer] record.  ROS  The following are not active complaints unless bolded sore throat, dysphagia, dental problems, itching, sneezing,  nasal congestion or excess/ purulent secretions, ear ache,   fever, chills, sweats, unintended wt loss, classically pleuritic or exertional cp,  orthopnea pnd or leg  swelling, presyncope, palpitations, abdominal pain, anorexia, nausea, vomiting, diarrhea  or change in bowel or bladder habits, change in stools or urine, dysuria,hematuria,  rash, arthralgias, visual complaints, headache, numbness, weakness or ataxia or problems with walking or coordination,  change in mood/affect or memory.               Objective:   Physical Exam     amb minimally  hoarse wf nad  07/14/2016          03/23/16 162 lb 9.6 oz (73.8 kg)  02/16/16 159 lb (72.1 kg)  05/17/15 155 lb 6.8 oz (70.5 kg)    Vital signs reviewed  - Note on arrival 02 sats  96% on RA    HEENT: nl dentition,  turbinates, and oropharynx. Nl external ear canals without cough reflex/ severe scarring R TM    NECK :  without JVD/Nodes/TM/ nl carotid upstrokes bilaterally   LUNGS: no acc muscle use,  Nl contour chest which is clear to A and P bilaterally without cough on insp or exp maneuvers   CV:  RRR  no s3 or murmur or increase in P2, nad no edema   ABD:  soft and nontender with nl inspiratory excursion in the supine position. No bruits or organomegaly appreciated, bowel sounds nl  MS:  Nl gait/ ext warm without deformities, calf tenderness, cyanosis or clubbing No obvious joint restrictions   SKIN: warm and dry without lesions    NEURO:  alert, approp, nl sensorium with  no motor or cerebellar deficits apparent.        I personally reviewed images and agree with radiology impression as follows:  CXR:   02/11/16 No active cardiopulmonary disease.      Assessment:

## 2017-01-11 ENCOUNTER — Other Ambulatory Visit: Payer: Self-pay | Admitting: Internal Medicine

## 2017-01-11 DIAGNOSIS — R0600 Dyspnea, unspecified: Secondary | ICD-10-CM

## 2017-01-13 ENCOUNTER — Encounter: Payer: Self-pay | Admitting: Internal Medicine

## 2017-01-13 ENCOUNTER — Ambulatory Visit (INDEPENDENT_AMBULATORY_CARE_PROVIDER_SITE_OTHER): Payer: No Typology Code available for payment source | Admitting: Internal Medicine

## 2017-01-13 VITALS — BP 122/60 | HR 89 | Ht 63.0 in | Wt 156.0 lb

## 2017-01-13 DIAGNOSIS — J4540 Moderate persistent asthma, uncomplicated: Secondary | ICD-10-CM

## 2017-01-13 DIAGNOSIS — R0600 Dyspnea, unspecified: Secondary | ICD-10-CM

## 2017-01-13 DIAGNOSIS — M069 Rheumatoid arthritis, unspecified: Secondary | ICD-10-CM

## 2017-01-13 LAB — PULMONARY FUNCTION TEST
DL/VA % pred: 83 %
DL/VA: 3.91 ml/min/mmHg/L
DLCO COR % PRED: 77 %
DLCO UNC % PRED: 72 %
DLCO UNC: 16.68 ml/min/mmHg
DLCO cor: 17.68 ml/min/mmHg
FEF 25-75 PRE: 1.16 L/s
FEF 25-75 Post: 1.48 L/sec
FEF2575-%Change-Post: 27 %
FEF2575-%PRED-POST: 71 %
FEF2575-%Pred-Pre: 55 %
FEV1-%Change-Post: 5 %
FEV1-%PRED-POST: 79 %
FEV1-%Pred-Pre: 75 %
FEV1-POST: 1.87 L
FEV1-Pre: 1.77 L
FEV1FVC-%Change-Post: 4 %
FEV1FVC-%Pred-Pre: 94 %
FEV6-%CHANGE-POST: 1 %
FEV6-%Pred-Post: 83 %
FEV6-%Pred-Pre: 82 %
FEV6-Post: 2.46 L
FEV6-Pre: 2.43 L
FEV6FVC-%PRED-POST: 104 %
FEV6FVC-%Pred-Pre: 104 %
FVC-%Change-Post: 1 %
FVC-%Pred-Post: 80 %
FVC-%Pred-Pre: 79 %
FVC-PRE: 2.43 L
FVC-Post: 2.46 L
POST FEV6/FVC RATIO: 100 %
PRE FEV1/FVC RATIO: 73 %
Post FEV1/FVC ratio: 76 %
Pre FEV6/FVC Ratio: 100 %
RV % pred: 162 %
RV: 3.29 L
TLC % PRED: 120 %
TLC: 5.9 L

## 2017-01-13 NOTE — Patient Instructions (Signed)
PFT done today. 

## 2017-01-13 NOTE — Progress Notes (Addendum)
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 Pulmonary Consult    Consult: Dr. Wilson Singer, intermittent chest tightness, no wheezing or cough   wheezng started roughly the same year she quit smoking assoc with wt gain x 35lb wt gain typically better p prednisone and better since spring 2017  On asthmanex 2 each am with rare saba needed but then Feb 10 2016 chest tightness and did not try saba > ER 02/21/16 with w/u neg w/u except for a/f levels both max sinuses> Rx Rosen= prednsione > back to baseline at time of ov with no cough or sob or chest tightness and  Not limited by breathing from desired activities including treadmill at 3.5 mph flat x 1.5 miles  Other than assoc of flares with cold/sinus infections >> No obvious patterns in day to day or daytime variability or assoc excess/ purulent sputum or mucus plugs or hemoptysis or cp or chest tightness, subjective wheeze or overt   hb symptoms. No unusual exp hx or h/o childhood pna/ asthma or knowledge of premature birth. 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 Follow-up    Pt states that her breathing has been doing well since last OV.  No new complaints. 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 Follow-up    Breathing is doing well. She has not had to use her albuterol inhaler at all and has only been using the symbicort 1-2 puffs every am.   symb 80 1-2 each am at most never using the 2 bid max  Not limited by breathing from desired activities   No cough   No obvious day to day or daytime variability or assoc  excess/ purulent sputum or mucus plugs or hemoptysis or cp or chest tightness, subjective wheeze or overt sinus or hb symptoms. No unusual exp hx or h/o childhood pna/ asthma or knowledge of premature birth.  Sleeping ok flat without nocturnal  or early am exacerbation  of respiratory  c/o's or need for noct saba. Also denies any obvious fluctuation of symptoms with weather or environmental changes or other aggravating or alleviating factors except as outlined above   Current Allergies, Complete Past Medical History, Past Surgical History, Family History, and Social History were reviewed in [employer] record.  ROS  The following are not active complaints unless bolded Hoarseness, sore throat, dysphagia, dental problems, itching, sneezing,  nasal congestion or discharge of excess mucus or purulent secretions, ear ache,    fever, chills, sweats, unintended wt loss or wt gain, classically pleuritic or exertional cp,  orthopnea pnd or leg swelling, presyncope, palpitations, abdominal pain, anorexia, nausea, vomiting, diarrhea  or change in bowel habits or change in bladder habits, change in stools or change in urine, dysuria, hematuria,  rash, arthralgias, visual complaints, headache, numbness, weakness or ataxia or problems with walking or coordination,  change in mood/affect or memory.        Current Meds  Medication Sig  . albuterol (PROAIR HFA) 108 (90 Base) MCG/ACT inhaler 2 puffs every 4 hours as needed only  if your can't catch your breath  . budesonide-formoterol (SYMBICORT) 80-4.5 MCG/ACT inhaler Take 2 puffs first thing in am and then another 2 puffs about 12 hours later.  . cetirizine (ZYRTEC) 10 MG tablet Take 10 mg by mouth at bedtime.  . fluticasone (FLONASE) 50 MCG/ACT nasal spray Place 1 spray into both nostrils daily.  . folic acid (FOLVITE) 1 MG tablet Take 2 tablets by mouth daily.  Marland Kitchen KLOR-CON 10 10 MEQ tablet Take 10 mEq by mouth daily.  . methimazole (TAPAZOLE) 5 MG tablet Take 5 mg by mouth every morning.   . methotrexate (RHEUMATREX) 2.5 MG tablet Take 6 tablets by mouth every 7 (seven) days.  Marland Kitchen torsemide (DEMADEX) 20 MG tablet Take 20 mg by mouth every other day.  . verapamil (CALAN-SR) 240 MG CR tablet Take 240 mg by mouth daily.                    Objective:   Physical Exam     amb wf nad   01/13/2017      156   03/23/16 162 lb 9.6 oz (73.8 kg)  02/16/16 159 lb (72.1 kg)  05/17/15 155 lb 6.8 oz (70.5 kg)    Vital signs reviewed  - Note on arrival 02 sats 100% RA      HEENT: nl dentition, turbinates, and oropharynx. Nl external ear canals without cough reflex/ severe scarring R TM    NECK :  without JVD/Nodes/TM/ nl carotid upstrokes bilaterally   LUNGS: no acc muscle use,  Nl contour chest which is clear to A and P bilaterally without cough on insp or exp  maneuvers   CV:  RRR  no s3 or murmur or increase in P2, nad no edema   ABD:  soft and nontender with nl inspiratory excursion in the supine position. No bruits or organomegaly appreciated, bowel sounds nl  MS:  Nl gait/ ext warm without deformities, calf tenderness, cyanosis or clubbing No obvious joint restrictions   SKIN: warm and dry without lesions    NEURO:  alert, approp, nl sensorium with  no  motor or cerebellar deficits apparent.            Assessment:

## 2017-01-13 NOTE — Patient Instructions (Addendum)
Continue symciort 80  Up to 2 pffs every 12 hours   Only use your albuterol ( Proair) as a rescue medication to be used if you can't catch your breath by resting or doing a relaxed purse lip breathing pattern.  - The less you use it, the better it will work when you need it. - Ok to use up to 2 puffs  every 4 hours if you must but call for immediate appointment if use goes up over your usual need - Don't leave home without it !!  (think of it like the spare tire for your car)     If you are satisfied with your treatment plan,  let your doctor know and he/she can either refill your medications or you can return here when your prescription runs out.     If in any way you are not 100% satisfied,  please tell us.  If 100% better, tell your friends!  Pulmonary follow up is as needed

## 2017-01-14 ENCOUNTER — Ambulatory Visit (HOSPITAL_COMMUNITY)
Admission: EM | Admit: 2017-01-14 | Discharge: 2017-01-14 | Disposition: A | Discharge status: Home / Self Care | Payer: No Typology Code available for payment source | Attending: Radiology | Admitting: Radiology

## 2017-01-14 ENCOUNTER — Encounter: Payer: Self-pay | Admitting: Internal Medicine

## 2017-01-14 ENCOUNTER — Encounter (HOSPITAL_COMMUNITY): Payer: Self-pay | Admitting: Emergency Medicine

## 2017-01-14 DIAGNOSIS — G8929 Other chronic pain: Secondary | ICD-10-CM | POA: Insufficient documentation

## 2017-01-14 DIAGNOSIS — Z79899 Other long term (current) drug therapy: Secondary | ICD-10-CM | POA: Diagnosis not present

## 2017-01-14 DIAGNOSIS — Z7951 Long term (current) use of inhaled steroids: Secondary | ICD-10-CM | POA: Insufficient documentation

## 2017-01-14 DIAGNOSIS — M25512 Pain in left shoulder: Secondary | ICD-10-CM | POA: Insufficient documentation

## 2017-01-14 DIAGNOSIS — E0590 Thyrotoxicosis, unspecified without thyrotoxic crisis or storm: Secondary | ICD-10-CM | POA: Diagnosis not present

## 2017-01-14 DIAGNOSIS — Z87891 Personal history of nicotine dependence: Secondary | ICD-10-CM | POA: Insufficient documentation

## 2017-01-14 DIAGNOSIS — Z883 Allergy status to other anti-infective agents status: Secondary | ICD-10-CM | POA: Insufficient documentation

## 2017-01-14 DIAGNOSIS — J069 Acute upper respiratory infection, unspecified: Secondary | ICD-10-CM | POA: Diagnosis not present

## 2017-01-14 DIAGNOSIS — I10 Essential (primary) hypertension: Secondary | ICD-10-CM | POA: Diagnosis not present

## 2017-01-14 DIAGNOSIS — M069 Rheumatoid arthritis, unspecified: Secondary | ICD-10-CM | POA: Insufficient documentation

## 2017-01-14 DIAGNOSIS — R05 Cough: Secondary | ICD-10-CM | POA: Diagnosis present

## 2017-01-14 LAB — POCT RAPID STREP A: STREPTOCOCCUS, GROUP A SCREEN (DIRECT): NEGATIVE

## 2017-01-14 MED ORDER — AMOXICILLIN-POT CLAVULANATE 875-125 MG PO TABS: 1.0000 | ORAL_TABLET | Freq: Two times a day (BID) | ORAL | 0 refills | Status: AC

## 2017-01-14 NOTE — ED Provider Notes (Signed)
[facility]    CSN: [id number] Arrival date & time: 01/14/17  1201     History   Chief Complaint Chief Complaint  Patient presents with  . Cough    HPI Brandy Townsend is a 66 y.o. female.   65 y.o. female presents with sore through, non productive cough, subjective fever and generalized aches X 2 days. Patient states that she woke up yesterday with a sore throat and her conditioned as worsened. Patient denies any nausea vomiting or diarrhea. Patient has 2 students with strep throat and states that she is newly diagnosed with RA. Patient has a history of allergies.    Condition is acute in nature. Condition is made better by othign. Condition is made worse by nothing. Patient denies any treatment prior to there arrival at this facility.        Past Medical History:  Diagnosis Date  . Hypertension   . Thyroid disease     Patient Active Problem List   Diagnosis Date Noted  . Rheumatoid arthritis ([place]) 01/14/2017  . Pain in both hands 05/12/2016  . Chronic left shoulder pain 05/12/2016  . Chronic sinusitis of both maxillary sinuses 03/24/2016  . 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 Chest pain syndrome 05/16/2015  . HTN (hypertension) 05/16/2015  . Hyperthyroidism 05/16/2015  . Prerenal azotemia 05/16/2015    History reviewed. No pertinent surgical history.  OB History    No data available       Home Medications    Prior to Admission medications   Medication Sig Start Date End Date Taking? Authorizing Provider  albuterol (PROAIR HFA) 108 (90 Base) MCG/ACT inhaler 2 puffs every 4 hours as needed only  if your can't catch your breath 07/14/16   Tanda Rockers, MD  amoxicillin-clavulanate (AUGMENTIN) 875-125 MG tablet Take 1 tablet by mouth every 12 (twelve) hours. 01/14/17 01/19/17  Jacqualine Mau, NP  budesonide-formoterol (SYMBICORT) 80-4.5 MCG/ACT inhaler Take 2 puffs first thing in am and then another 2 puffs about  12 hours later. 07/14/16   Tanda Rockers, MD  cetirizine (ZYRTEC) 10 MG tablet Take 10 mg by mouth at bedtime.    [provider]  fluticasone (FLONASE) 50 MCG/ACT nasal spray Place 1 spray into both nostrils daily.    [provider]  folic acid (FOLVITE) 1 MG tablet Take 2 tablets by mouth daily. 09/13/16   [provider]  KLOR-CON 10 10 MEQ tablet Take 10 mEq by mouth daily. 08/11/14   [provider]  methimazole (TAPAZOLE) 5 MG tablet Take 5 mg by mouth every morning.     [provider]  methotrexate (RHEUMATREX) 2.5 MG tablet Take 6 tablets by mouth every 7 (seven) days. 09/22/16   [provider]  torsemide (DEMADEX) 20 MG tablet Take 20 mg by mouth every other day. 04/26/16   [provider]  verapamil (CALAN-SR) 240 MG CR tablet Take 240 mg by mouth daily. 07/25/14   [provider]    Family History No family history on file.  Social History Social History  Substance Use Topics  . Smoking status: Former Smoker    Quit date: 03/28/2009  . Smokeless tobacco: Never Used  . Alcohol use No     Allergies   Macrobid [nitrofurantoin monohyd macro]   Review of Systems Review of Systems  Constitutional: Positive for fever.  HENT: Positive for congestion.   Respiratory: Positive for cough ( non productive).  Physical Exam Triage Vital Signs ED Triage Vitals  Enc Vitals Group     BP 01/14/17 1216 133/89     Pulse Rate 01/14/17 1216 98     Resp 01/14/17 1216 18     Temp 01/14/17 1216 98.6 F (37 C)     Temp Source 01/14/17 1216 Oral     SpO2 01/14/17 1216 98 %     Weight --      Height --      Head Circumference --      Peak Flow --      Pain Score 01/14/17 1214 5     Pain Loc --      Pain Edu? --      Excl. in [place]? --    No data found.   Updated Vital Signs BP 133/89 (BP Location: Left Arm)   Pulse 98   Temp 98.6 F (37 C) (Oral)   Resp 18   SpO2 98%   Visual Acuity Right Eye  Distance:   Left Eye Distance:   Bilateral Distance:    Right Eye Near:   Left Eye Near:    Bilateral Near:     Physical Exam  Constitutional: She is oriented to person, place, and time. She appears well-developed and well-nourished.  HENT:  Head: Normocephalic and atraumatic.  Eyes: Conjunctivae are normal.  Neck: Normal range of motion.  Pulmonary/Chest: Effort normal.  Neurological: She is alert and oriented to person, place, and time.  Psychiatric: She has a normal mood and affect.  Nursing note and vitals reviewed.    UC Treatments / Results  Labs (all labs ordered are listed, but only abnormal results are displayed) Labs Reviewed  CULTURE, GROUP A STREP [facility])  POCT RAPID STREP A    EKG  EKG Interpretation None       Radiology No results found.  Procedures Procedures (including critical care time)  Medications Ordered in UC Medications - No data to display   Initial Impression / Assessment and Plan / UC Course  I have reviewed the triage vital signs and the nursing notes.  Pertinent labs & imaging results that were available during my care of the patient were reviewed by me and considered in my medical decision making (see chart for details).       Final Clinical Impressions(s) / UC Diagnoses   Final diagnoses:  Acute upper respiratory infection    New Prescriptions New Prescriptions   AMOXICILLIN-CLAVULANATE (AUGMENTIN) 875-125 MG TABLET    Take 1 tablet by mouth every 12 (twelve) hours.     Controlled Substance Prescriptions [place] Controlled Substance Registry consulted? Not Applicable   Jacqualine Mau, NP 01/14/17 1254

## 2017-01-14 NOTE — Discharge Instructions (Signed)
Continue to push fluids and take over the counter allergy medications as directed on the back of the box for symptomatic relief.   If no better in 3-4 start prescription.

## 2017-01-14 NOTE — Assessment & Plan Note (Addendum)
-    Spirometry 03/23/2016  FEV1 1.59 (65%)  Ratio 66   -  FENO 03/23/2016  =   33  - 03/23/2016    try dulera 200 one bid to increase to 2 bid with symptoms > changed to symbicort by insurance     - PFT's  01/13/2017  FEV1 1.87  (79 % ) ratio 76  p 5 % improvement from saba p one pff symb 80  prior to study with DLCO  72/77 % corrects to 83 % for alv volume   - 01/13/2017  After extensive coaching HFA effectiveness =  90%   She really has very little evidence of copd so this is better characterized as mild persistent asthma now and only requiring very low doses of laba/ics to control   Based on the study from NEJM  378; 20 p 1865 (2018) in pts with mild asthma it is reasonable to use low dose symbicort eg 80 2bid "prn" flare in this setting but I emphasized this was only shown with symbicort and takes advantage of the rapid onset of action but is not the same as "rescue therapy" but can be stopped once the acute symptoms have resolved and the need for rescue has been minimized (< 2 x weekly)    All goals of chronic asthma control met including optimal function and elimination of symptoms with minimal need for rescue therapy.  Contingencies discussed in full including contacting this office immediately if not controlling the symptoms using the rule of two's.     Pulmonary f/u can be prn   I had an extended discussion with the patient reviewing all relevant studies completed to date and  lasting 15 to 20 minutes of a 25 minute visit    Each maintenance medication was reviewed in detail including most importantly the difference between maintenance and prns and under what circumstances the prns are to be triggered using an action plan format that is not reflected in the computer generated alphabetically organized AVS.    Please see AVS for specific instructions unique to this visit that I personally wrote and verbalized to the the pt in detail and then reviewed with pt  by my nurse highlighting any   changes in therapy recommended at today's visit to their plan of care.

## 2017-01-14 NOTE — ED Triage Notes (Signed)
Cough and sorethroat.  Started yesterday morning.  Saw pulmonologist for routine appt.  He said lungs were good.  Throughout the night symptoms worsened.  Phlegm today is clear to yellow and aching.    Family member is recovering from pneumonia.    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

## 2017-01-14 NOTE — Assessment & Plan Note (Signed)
mtx started  June 6th 2018  - baseline pft done 01/13/2017 wnl  Can return for annual pfts if requested by rheumatolgy

## 2017-01-17 LAB — CULTURE, GROUP A STREP (THRC)

## 2017-02-01 ENCOUNTER — Encounter (HOSPITAL_COMMUNITY): Payer: Self-pay | Admitting: Radiology

## 2017-02-01 ENCOUNTER — Emergency Department (HOSPITAL_COMMUNITY): Payer: No Typology Code available for payment source

## 2017-02-01 ENCOUNTER — Emergency Department (HOSPITAL_COMMUNITY)
Admission: EM | Admit: 2017-02-01 | Discharge: 2017-02-02 | Disposition: A | Discharge status: Home / Self Care | Payer: No Typology Code available for payment source | Attending: Physician Assistant | Admitting: Physician Assistant

## 2017-02-01 DIAGNOSIS — R0602 Shortness of breath: Secondary | ICD-10-CM | POA: Insufficient documentation

## 2017-02-01 DIAGNOSIS — R1013 Epigastric pain: Secondary | ICD-10-CM | POA: Diagnosis not present

## 2017-02-01 DIAGNOSIS — I10 Essential (primary) hypertension: Secondary | ICD-10-CM | POA: Insufficient documentation

## 2017-02-01 DIAGNOSIS — R072 Precordial pain: Secondary | ICD-10-CM | POA: Diagnosis not present

## 2017-02-01 DIAGNOSIS — Z87891 Personal history of nicotine dependence: Secondary | ICD-10-CM | POA: Diagnosis not present

## 2017-02-01 DIAGNOSIS — R42 Dizziness and giddiness: Secondary | ICD-10-CM | POA: Diagnosis not present

## 2017-02-01 DIAGNOSIS — R112 Nausea with vomiting, unspecified: Secondary | ICD-10-CM | POA: Diagnosis not present

## 2017-02-01 DIAGNOSIS — E0590 Thyrotoxicosis, unspecified without thyrotoxic crisis or storm: Secondary | ICD-10-CM | POA: Insufficient documentation

## 2017-02-01 DIAGNOSIS — Z79899 Other long term (current) drug therapy: Secondary | ICD-10-CM | POA: Diagnosis not present

## 2017-02-01 DIAGNOSIS — R079 Chest pain, unspecified: Secondary | ICD-10-CM | POA: Diagnosis present

## 2017-02-01 DIAGNOSIS — J45909 Unspecified asthma, uncomplicated: Secondary | ICD-10-CM | POA: Diagnosis not present

## 2017-02-01 LAB — CBC
HCT: 37.9 % (ref 36.0–46.0)
Hemoglobin: 12.8 g/dL (ref 12.0–15.0)
MCH: 31.4 pg (ref 26.0–34.0)
MCHC: 33.8 g/dL (ref 30.0–36.0)
MCV: 92.9 fL (ref 78.0–100.0)
PLATELETS: 329 10*3/uL (ref 150–400)
RBC: 4.08 MIL/uL (ref 3.87–5.11)
RDW: 13.8 % (ref 11.5–15.5)
WBC: 6.8 10*3/uL (ref 4.0–10.5)

## 2017-02-01 LAB — URINALYSIS, ROUTINE W REFLEX MICROSCOPIC
Glucose, UA: NEGATIVE mg/dL
Hgb urine dipstick: NEGATIVE
KETONES UR: 15 mg/dL — AB
NITRITE: NEGATIVE
PH: 7.5 (ref 5.0–8.0)
Protein, ur: NEGATIVE mg/dL
Specific Gravity, Urine: 1.015 (ref 1.005–1.030)

## 2017-02-01 LAB — COMPREHENSIVE METABOLIC PANEL
ALK PHOS: 83 U/L (ref 38–126)
ALT: 48 U/L (ref 14–54)
ANION GAP: 9 (ref 5–15)
AST: 27 U/L (ref 15–41)
Albumin: 3.7 g/dL (ref 3.5–5.0)
BILIRUBIN TOTAL: 0.5 mg/dL (ref 0.3–1.2)
BUN: 17 mg/dL (ref 6–20)
CALCIUM: 9.3 mg/dL (ref 8.9–10.3)
CO2: 23 mmol/L (ref 22–32)
Chloride: 106 mmol/L (ref 101–111)
Creatinine, Ser: 0.72 mg/dL (ref 0.44–1.00)
Glucose, Bld: 102 mg/dL — ABNORMAL HIGH (ref 65–99)
Potassium: 3.5 mmol/L (ref 3.5–5.1)
Sodium: 138 mmol/L (ref 135–145)
TOTAL PROTEIN: 6.5 g/dL (ref 6.5–8.1)

## 2017-02-01 LAB — I-STAT TROPONIN, ED
Troponin i, poc: 0 ng/mL (ref 0.00–0.08)
Troponin i, poc: 0 ng/mL (ref 0.00–0.08)

## 2017-02-01 LAB — URINALYSIS, MICROSCOPIC (REFLEX): RBC / HPF: NONE SEEN RBC/hpf (ref 0–5)

## 2017-02-01 LAB — LIPASE, BLOOD: Lipase: 22 U/L (ref 11–51)

## 2017-02-01 LAB — BRAIN NATRIURETIC PEPTIDE: B NATRIURETIC PEPTIDE 5: 28.3 pg/mL (ref 0.0–100.0)

## 2017-02-01 MED ORDER — MECLIZINE HCL 25 MG PO TABS
25.0000 mg | ORAL_TABLET | Freq: Once | ORAL | Status: AC
  Administered 2017-02-01: 25 mg via ORAL
  Filled 2017-02-01: qty 1

## 2017-02-01 NOTE — ED Notes (Signed)
Urine sample requested from pt.

## 2017-02-01 NOTE — ED Provider Notes (Signed)
[facility] DEPART[facility] Provider Note   CSN: [id number] Arrival date & time: 02/01/17  1954     History   Chief Complaint Chief Complaint  Patient presents with  . Chest Pain    HPI Brandy Townsend is a 66 y.o. female.  [name] is a 66 y.o. Female who presents to the ED complaining of chest tightness, room spinning dizziness, nausea and vomiting today.  Patient reports yesterday she had an episode of room spinning dizziness with nausea and vomited once. She felt better after and ate dinner. She reports today her symptoms returned and she had room spinning dizziness with associated nausea, vomiting and chest tightness.  She reports it felt like there was an Ace bandage over her chest.  She also reports feeling like her throat was closing up.  EMS gave her Benadryl and Zofran and she tells me she is feeling much better now.  She reports only mild chest tightness currently.  No shortness of breath, however she did feel short of breath briefly earlier.  She denies any headache.  No history of vertigo.  She does tell me she takes methotrexate for her rheumatoid arthritis.  She denies history of MI.  She denies previous abdominal surgeries.  She also reports a bruise to her right forearm that she first noticed about 3 days ago.  No other bruising noted.  She denies fevers, coughing, hemoptysis, leg pain, leg swelling, recent long travel, numbness, tingling, weakness, changes to her vision, neck pain, hematemesis, or diarrhea.   The history is provided by the patient, medical records and the spouse. No language interpreter was used.  Chest Pain   Associated symptoms include dizziness, nausea and shortness of breath. Pertinent negatives include no abdominal pain, no back pain, no cough, no fever, no headaches, no numbness, no palpitations, no vomiting and no weakness.    Past Medical History:  Diagnosis Date  . Hypertension   . Thyroid disease     Patient  Active Problem List   Diagnosis Date Noted  . Rheumatoid arthritis ([place]) 01/14/2017  . Pain in both hands 05/12/2016  . Chronic left shoulder pain 05/12/2016  . Chronic sinusitis of both maxillary sinuses 03/24/2016  . 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 Chest pain syndrome 05/16/2015  . HTN (hypertension) 05/16/2015  . Hyperthyroidism 05/16/2015  . Prerenal azotemia 05/16/2015    No past surgical history on file.  OB History    No data available       Home Medications    Prior to Admission medications   Medication Sig Start Date End Date Taking? Authorizing Provider  albuterol (PROAIR HFA) 108 (90 Base) MCG/ACT inhaler 2 puffs every 4 hours as needed only  if your can't catch your breath 07/14/16  Yes Tanda Rockers, MD  budesonide-formoterol (SYMBICORT) 80-4.5 MCG/ACT inhaler Take 2 puffs first thing in am and then another 2 puffs about 12 hours later. Patient taking differently: Inhale 2 puffs 2 (two) times daily as needed into the lungs (SOB).  07/14/16  Yes Tanda Rockers, MD  cetirizine (ZYRTEC) 10 MG tablet Take 10 mg by mouth at bedtime.   Yes [provider]  fluticasone (FLONASE) 50 MCG/ACT nasal spray Place 1 spray into both nostrils daily.   Yes [provider]  folic acid (FOLVITE) 1 MG tablet Take 1 tablet once a week by mouth. Tuesday evening after Chemo 09/13/16  Yes [provider]  KLOR-CON 10  10 MEQ tablet Take 10 mEq by mouth daily. 08/11/14  Yes [provider]  methimazole (TAPAZOLE) 5 MG tablet Take 5 mg by mouth every morning.    Yes [provider]  methotrexate (RHEUMATREX) 2.5 MG tablet Take 7.5 tablets See admin instructions by mouth. Three tablet in the morning and evening just on mondays 09/22/16  Yes [provider]  torsemide (DEMADEX) 20 MG tablet Take 20 mg by mouth every other day. 04/26/16  Yes [provider]  verapamil (CALAN-SR) 240 MG CR tablet Take 240 mg  by mouth daily. 07/25/14  Yes [provider]  meclizine (ANTIVERT) 25 MG tablet Take 1 tablet (25 mg total) 3 (three) times daily as needed by mouth for dizziness. 02/02/17   Waynetta Pean, PA-C  ondansetron (ZOFRAN ODT) 4 MG disintegrating tablet Take 1 tablet (4 mg total) every 8 (eight) hours as needed by mouth for nausea or vomiting. 02/02/17   Waynetta Pean, PA-C    Family History No family history on file.  Social History Social History   Tobacco Use  . Smoking status: Former Smoker    Last attempt to quit: 03/28/2009    Years since quitting: 7.8  . Smokeless tobacco: Never Used  Substance Use Topics  . Alcohol use: No  . Drug use: No     Allergies   Macrobid [nitrofurantoin monohyd macro]   Review of Systems Review of Systems  Constitutional: Negative for chills and fever.  HENT: Negative for congestion and sore throat.   Eyes: Negative for pain and visual disturbance.  Respiratory: Positive for shortness of breath. Negative for cough and wheezing.   Cardiovascular: Positive for chest pain. Negative for palpitations and leg swelling.  Gastrointestinal: Positive for nausea. Negative for abdominal pain, diarrhea and vomiting.  Genitourinary: Negative for difficulty urinating and dysuria.  Musculoskeletal: Negative for back pain and neck pain.  Skin: Negative for rash.  Neurological: Positive for dizziness. Negative for syncope, weakness, light-headedness, numbness and headaches.     Physical Exam Updated Vital Signs BP 104/65   Pulse 60   Temp 98.5 F (36.9 C) (Oral)   Resp 17   Ht 5\' 4"  (1.626 m)   Wt 68 kg (150 lb)   SpO2 95%   BMI 25.75 kg/m   Physical Exam  Constitutional: She is oriented to person, place, and time. She appears well-developed and well-nourished.  Non-toxic appearance. She does not appear ill. No distress.  HENT:  Head: Normocephalic and atraumatic.  Mouth/Throat: Oropharynx is clear and moist.  Eyes: Conjunctivae and EOM  are normal. Pupils are equal, round, and reactive to light. Right eye exhibits no discharge. Left eye exhibits no discharge.  Neck: Normal range of motion. Neck supple. No JVD present.  Cardiovascular: Normal rate, regular rhythm, normal heart sounds and intact distal pulses. Exam reveals no gallop and no friction rub.  No murmur heard. Pulses:      Radial pulses are 2+ on the right side, and 2+ on the left side.       Dorsalis pedis pulses are 2+ on the right side, and 2+ on the left side.       Posterior tibial pulses are 2+ on the right side, and 2+ on the left side.  Pulmonary/Chest: Effort normal and breath sounds normal. No accessory muscle usage. No tachypnea. No respiratory distress. She has no decreased breath sounds. She has no wheezes. She has no rales.  Abdominal: Soft. Bowel sounds are normal. She exhibits no distension and  no mass. There is tenderness. There is no guarding.  Abdomen is soft.  Bowel sounds are present.  Patient has epigastric abdominal tenderness to palpation.  Musculoskeletal: She exhibits no edema.       Right lower leg: She exhibits no tenderness and no edema.       Left lower leg: She exhibits no tenderness and no edema.  Lymphadenopathy:    She has no cervical adenopathy.  Neurological: She is alert and oriented to person, place, and time. She is not disoriented. No cranial nerve deficit. Coordination normal.  Patient is alert and oriented 3. Speech is clear and coherent. Cranial nerves are intact. Sensation and strength is intact to bilateral upper and lower extremities.  EOMs are intact.  Vision is grossly intact.  No pronator drift.  Finger to nose intact bilaterally.  Normal gait.  Skin: Skin is warm and dry. Capillary refill takes less than 2 seconds. No rash noted. She is not diaphoretic. No erythema. No pallor.  Psychiatric: She has a normal mood and affect. Her behavior is normal.  Nursing note and vitals reviewed.    ED Treatments / Results   Labs (all labs ordered are listed, but only abnormal results are displayed) Labs Reviewed  COMPREHENSIVE METABOLIC PANEL - Abnormal; Notable for the following components:      Result Value   Glucose, Bld 102 (*)    All other components within normal limits  URINALYSIS, ROUTINE W REFLEX MICROSCOPIC - Abnormal; Notable for the following components:   APPearance CLOUDY (*)    Bilirubin Urine SMALL (*)    Ketones, ur 15 (*)    Leukocytes, UA TRACE (*)    All other components within normal limits  URINALYSIS, MICROSCOPIC (REFLEX) - Abnormal; Notable for the following components:   Bacteria, UA RARE (*)    Squamous Epithelial / LPF 0-5 (*)    All other components within normal limits  CBC  LIPASE, BLOOD  BRAIN NATRIURETIC PEPTIDE  I-STAT TROPONIN, ED  I-STAT TROPONIN, ED    EKG  EKG Interpretation  Date/Time:  Wednesday February 01 2017 20:02:03 EST Ventricular Rate:  66 PR Interval:    QRS Duration: 85 QT Interval:  393 QTC Calculation: 412 R Axis:   64 Text Interpretation:  Sinus rhythm Atrial premature complex Abnormal R-wave progression, early transition Normal sinus rhythm Confirmed by Zenovia Jarred [phone]) on 02/01/2017 11:05:25 PM       Radiology Dg Chest 2 View  Result Date: 02/01/2017 CLINICAL DATA:  Nausea onset this morning. Tightness below the sternum. Difficulty swelling. EXAM: CHEST  2 VIEW COMPARISON:  11/25/2016 FINDINGS: The heart size and mediastinal contours are within normal limits. Both lungs are clear. Mild thoracic spondylosis with multilevel degenerative disc space narrowing and osteophyte formation. Mild S-shaped scoliosis of the dorsal spine is re- demonstrated. IMPRESSION: No active cardiopulmonary disease. Electronically Signed   By: Ashley Royalty M.D.   On: 02/01/2017 21:09   Ct Head Wo Contrast  Result Date: 02/01/2017 CLINICAL DATA:  Nausea and vomiting beginning yesterday. Hypertension. EXAM: CT HEAD WITHOUT CONTRAST TECHNIQUE: Contiguous  axial images were obtained from the base of the skull through the vertex without intravenous contrast. COMPARISON:  02/11/2016 FINDINGS: Brain: No evidence of acute infarction, hemorrhage, hydrocephalus, extra-axial collection or mass lesion/mass effect. Vascular: No hyperdense vessel or unexpected calcification. Skull: Normal. Negative for fracture or focal lesion. Sinuses/Orbits: Near complete opacification of the left maxillary sinus with small air-fluid level in the right maxillary sinus. Mild ethmoid sinus mucosal  thickening. Clear frontal and sphenoid sinuses. Intact orbits and globes. Other: Clear bilateral mastoids. IMPRESSION: 1. Stable appearance the brain without acute intracranial abnormality. 2. Maxillary sinusitis, chronic in appearance on the left and acute on the right. Electronically Signed   By: Ashley Royalty M.D.   On: 02/01/2017 21:22    Procedures Procedures (including critical care time)  Medications Ordered in ED Medications  meclizine (ANTIVERT) tablet 25 mg (25 mg Oral Given 02/01/17 2319)     Initial Impression / Assessment and Plan / ED Course  I have reviewed the triage vital signs and the nursing notes.  Pertinent labs & imaging results that were available during my care of the patient were reviewed by me and considered in my medical decision making (see chart for details).     This is a 66 y.o. Female who presents to the ED complaining of chest tightness, room spinning dizziness, nausea and vomiting today.  Patient reports yesterday she had an episode of room spinning dizziness with nausea and vomited once. She felt better after and ate dinner. She reports today her symptoms returned and she had room spinning dizziness with associated nausea, vomiting and chest tightness.  She reports it felt like there was an Ace bandage over her chest.  She also reports feeling like her throat was closing up.  EMS gave her Benadryl and Zofran and she tells me she is feeling much better  now.  She reports only mild chest tightness currently.  No shortness of breath, however she did feel short of breath briefly earlier.  She denies any headache. On exam the patient is afebrile nontoxic-appearing.  She has no focal neurological deficits on exam.  Lungs are clear to auscultation bilaterally.  She has no tachycardia, hypoxia or tachypnea on exam.  EKG shows normal sinus rhythm.  No evidence of STEMI.  Initial troponin is not elevated.  CBC and CMP are unremarkable.  Normal liver enzymes.  Normal lipase. Chest x-ray is unremarkable.  CT head is unremarkable. Delta troponin is also unremarkable.  Low suspicion for ACS in this patient.  Following meclizine in the emergency department patient reports she is feeling much better.  Question if patient's vertigo caused her to have nausea and some vomiting.  She has had no further vomiting in the emergency department.  Tolerating p.o.  Will discharge with prescription for meclizine and have her follow-up with primary care.  I also encouraged her to follow-up with cardiology for possible stress test.  Patient agrees with plan. I advised the patient to follow-up with their primary care provider this week. I advised the patient to return to the emergency department with new or worsening symptoms or new concerns. The patient verbalized understanding and agreement with plan.   This patient was discussed with Dr. Thomasene Lot who agrees with assessment and plan.   Final Clinical Impressions(s) / ED Diagnoses   Final diagnoses:  Vertigo  Precordial pain  Non-intractable vomiting with nausea, unspecified vomiting type  Epigastric abdominal pain    ED Discharge Orders        Ordered    meclizine (ANTIVERT) 25 MG tablet  3 times daily PRN     02/02/17 0029    ondansetron (ZOFRAN ODT) 4 MG disintegrating tablet  Every 8 hours PRN     02/02/17 0029       Waynetta Pean, PA-C 02/02/17 0039    Macarthur Critchley, MD 02/12/17 2307

## 2017-02-01 NOTE — ED Notes (Signed)
Patient transported to X-ray 

## 2017-02-01 NOTE — ED Triage Notes (Signed)
Pt to ED by [facility] for evaluation of CP onset today. Pt has N/V x2 days. Vomited yesterday and felt better, but has thrown up x6 today and with no relief of symptoms. At 1815 today pt reports onset of tightness in chest that radiates to each side with associated SOB, dizziness, and trouble swallowing. Pt has hx of RA but has had increased pain for two weeks. Given Tramadol which she reports made her act weird so is taking Naproxen until yesterday when she took 1/4 Tramadol pill last night. Has taken neither medication today. Pt given 25 mg Benadryl and 4 mg Zofran in route by EMS with relief of N/V and lessening of CP. NAD at this time.

## 2017-02-02 MED ORDER — MECLIZINE HCL 25 MG PO TABS: 25.0000 mg | ORAL_TABLET | Freq: Three times a day (TID) | ORAL | 0 refills | Status: DC | PRN

## 2017-02-02 MED ORDER — ONDANSETRON 4 MG PO TBDP: 4.0000 mg | ORAL_TABLET | Freq: Three times a day (TID) | ORAL | 0 refills | Status: DC | PRN

## 2017-03-27 ENCOUNTER — Other Ambulatory Visit (HOSPITAL_COMMUNITY): Payer: Self-pay | Admitting: Internal Medicine

## 2017-03-27 DIAGNOSIS — E0590 Thyrotoxicosis, unspecified without thyrotoxic crisis or storm: Secondary | ICD-10-CM

## 2017-04-06 ENCOUNTER — Other Ambulatory Visit (HOSPITAL_COMMUNITY): Payer: Self-pay | Admitting: Internal Medicine

## 2017-04-06 DIAGNOSIS — E0590 Thyrotoxicosis, unspecified without thyrotoxic crisis or storm: Secondary | ICD-10-CM

## 2017-04-10 ENCOUNTER — Encounter (HOSPITAL_COMMUNITY): Payer: No Typology Code available for payment source

## 2017-04-11 ENCOUNTER — Encounter (HOSPITAL_COMMUNITY): Payer: No Typology Code available for payment source

## 2017-04-11 ENCOUNTER — Encounter (HOSPITAL_COMMUNITY)
Admission: RE | Admit: 2017-04-11 | Discharge: 2017-04-11 | Disposition: A | Discharge status: Home / Self Care | Payer: No Typology Code available for payment source | Source: Ambulatory Visit | Attending: Internal Medicine | Admitting: Internal Medicine

## 2017-04-11 DIAGNOSIS — E0590 Thyrotoxicosis, unspecified without thyrotoxic crisis or storm: Secondary | ICD-10-CM

## 2017-04-12 ENCOUNTER — Encounter (HOSPITAL_COMMUNITY)
Admission: RE | Admit: 2017-04-12 | Discharge: 2017-04-12 | Disposition: A | Discharge status: Home / Self Care | Payer: No Typology Code available for payment source | Source: Ambulatory Visit | Attending: Internal Medicine | Admitting: Internal Medicine

## 2017-04-12 MED ORDER — SODIUM IODIDE I 131 CAPSULE
0.0120 | Freq: Once | INTRAVENOUS | Status: AC | PRN
  Administered 2017-04-12: 0.012 via ORAL

## 2017-04-12 MED ORDER — SODIUM PERTECHNETATE TC 99M INJECTION
10.0000 | Freq: Once | INTRAVENOUS | Status: AC | PRN
  Administered 2017-04-12: 10 via INTRAVENOUS

## 2017-04-20 ENCOUNTER — Ambulatory Visit (HOSPITAL_COMMUNITY)
Admission: RE | Admit: 2017-04-20 | Discharge: 2017-04-20 | Disposition: A | Discharge status: Home / Self Care | Payer: No Typology Code available for payment source | Source: Ambulatory Visit | Attending: Internal Medicine | Admitting: Internal Medicine

## 2017-04-20 DIAGNOSIS — E0590 Thyrotoxicosis, unspecified without thyrotoxic crisis or storm: Secondary | ICD-10-CM | POA: Diagnosis not present

## 2017-04-20 MED ORDER — SODIUM IODIDE I 131 CAPSULE
18.0000 | Freq: Once | INTRAVENOUS | Status: AC | PRN
  Administered 2017-04-20: 18 via ORAL

## 2017-04-27 ENCOUNTER — Ambulatory Visit (HOSPITAL_COMMUNITY): Payer: No Typology Code available for payment source

## 2017-05-02 NOTE — Progress Notes (Signed)
Cardiology Office Note   Date:  05/10/2017   ID:  Brandy Townsend, DOB 1950-07-25, MRN [id number]  PCP:  Jani Gravel, MD  Cardiologist:   Jenkins Rouge, MD   No chief complaint on file.     History of Present Illness: Brandy Townsend is a 67 y.o. female who presents for f/u palpitations  Seen in ER 11/16 with palpitations and dyspnea 4 days. Reports HR being high on fit bit. No chest pain Associated with headache Pain reproducable with palpation anterior neck. Prior to this had admission for chest pain 05/17/15.  Reviewed  myovue diaphragmatic attenuation No ischemia EF 72%   Echo: normal EF 60-65%  05/17/15  CXR:  NAD no CE  02/11/16 Labs r/o with TSH .8 normal and normal free T4  LDL 93  Hct 41.5  02/11/16   She has asthma use to be on beta blocker for BP but not helpful.  Quit smoking 9 years ago but bronchitic Taken off asthminex by  [employer] and got worse Occasionally uses steroids   Issues with symbicort causing carpal tunnel like symptoms   Seen in ER 02/01/17 with vertigo, nausea, vomiting and atypical chest pain r/o no acute ECG changes normal CXR  Recent Thyroid ablation Been Diagnosed with RA and feels better on Methotrexate   Past Medical History:  Diagnosis Date  . Graves disease   . Hypertension   . Osteoporosis   . Rheumatoid arthritis ([place])   . Thyroid disease   . Vertigo     History reviewed. No pertinent surgical history.   Current Outpatient Medications  Medication Sig Dispense Refill  . cetirizine (ZYRTEC) 10 MG tablet Take 10 mg by mouth at bedtime.    . fluticasone (FLONASE) 50 MCG/ACT nasal spray Place 1 spray into both nostrils daily.    . folic acid (FOLVITE) 1 MG tablet Take 1 tablet once a week by mouth. Tuesday evening after Chemo    . KLOR-CON 10 10 MEQ tablet Take 10 mEq by mouth daily.  0  . meclizine (ANTIVERT) 25 MG tablet Take 1 tablet (25 mg total) 3 (three) times daily as needed by mouth for dizziness. 30 tablet 0  . methotrexate  (RHEUMATREX) 2.5 MG tablet Take 7.5 tablets See admin instructions by mouth. Three tablet in the morning and evening just on mondays    . ondansetron (ZOFRAN ODT) 4 MG disintegrating tablet Take 1 tablet (4 mg total) every 8 (eight) hours as needed by mouth for nausea or vomiting. 10 tablet 0  . torsemide (DEMADEX) 20 MG tablet Take 20 mg by mouth every other day.  0  . verapamil (CALAN-SR) 240 MG CR tablet Take 240 mg by mouth daily.  12   No current facility-administered medications for this visit.     Allergies:   Macrobid [nitrofurantoin monohyd macro]    Social History:  The patient  reports that she quit smoking about 8 years ago. she has never used smokeless tobacco. She reports that she does not drink alcohol or use drugs.   Family History:  The patient's family history is not on file.    ROS:  Please see the history of present illness.   Otherwise, review of systems are positive for none.   All other systems are reviewed and negative.    PHYSICAL EXAM: VS:  BP 112/76   Pulse 80   Ht 5\' 4"  (1.626 m)   Wt 156 lb (70.8 kg)   SpO2 98%   BMI  26.78 kg/m  , BMI Body mass index is 26.78 kg/m. Affect appropriate Healthy:  appears stated age 8: normal Neck supple with no adenopathy JVP normal no bruits no thyromegaly Lungs clear with no wheezing and good diaphragmatic motion Heart:  S1/S2 no murmur, no rub, gallop or click PMI normal Abdomen: benighn, BS positve, no tenderness, no AAA no bruit.  No HSM or HJR Distal pulses intact with no bruits No edema Neuro non-focal Skin warm and dry No muscular weakness     EKG:  SR rate 85 normal 02/12/16    Recent Labs: 02/01/2017: ALT 48; B Natriuretic Peptide 28.3; BUN 17; Creatinine, Ser 0.72; Hemoglobin 12.8; Platelets 329; Potassium 3.5; Sodium 138    Lipid Panel    Component Value Date/Time   CHOL 162 05/17/2015 0343   TRIG 81 05/17/2015 0343   HDL 53 05/17/2015 0343   CHOLHDL 3.1 05/17/2015 0343   VLDL 16  05/17/2015 0343   LDLCALC 93 05/17/2015 0343      Wt Readings from Last 3 Encounters:  05/10/17 156 lb (70.8 kg)  02/01/17 150 lb (68 kg)  01/13/17 156 lb (70.8 kg)      Other studies Reviewed: Additional studies/ records that were reviewed today include: Primary notes [facility] notes myovue , echo ECG and labs .    ASSESSMENT AND PLAN:  1. Tachycardia/Palpitations benign likely related to stress, anxiety and asthma observe 2. Dyspnea Echo with normal EF no murmur likely related to previous smoking and asthma  3. Chest Pain  Resolved normal stress test 04/2015 observe 4. HTN:  Continue diuretic and verapamil  5. Asthma  F/u Wert thinks symbicort causing pain in wrists  6. RA seems to be uni forming diagnosis for her dyspnea, chest pain, and arthritis continue methotrexate ? Elevated LFTls on med check blood work today 7. Cholesterol:  Will check labs today not done by primary in a long time  Current medicines are reviewed at length with the patient today.  The patient does not have concerns regarding medicines.  The following changes have been made:  no change  Labs/ tests ordered today include: Chol/LFTls   Orders Placed This Encounter  Procedures  . Lipid panel  . Hepatic function panel     Disposition:   FU with me in a year      Signed, Jenkins Rouge, MD  05/10/2017 10:39 AM    [facility] [address], [place], [place]  [address] Phone: [phone]; Fax: [phone]

## 2017-05-10 ENCOUNTER — Ambulatory Visit (INDEPENDENT_AMBULATORY_CARE_PROVIDER_SITE_OTHER): Payer: No Typology Code available for payment source | Admitting: Cardiovascular Disease

## 2017-05-10 ENCOUNTER — Encounter: Payer: Self-pay | Admitting: Cardiovascular Disease

## 2017-05-10 VITALS — BP 112/76 | HR 80 | Ht 64.0 in | Wt 156.0 lb

## 2017-05-10 DIAGNOSIS — I10 Essential (primary) hypertension: Secondary | ICD-10-CM | POA: Diagnosis not present

## 2017-05-10 DIAGNOSIS — R002 Palpitations: Secondary | ICD-10-CM | POA: Diagnosis not present

## 2017-05-10 LAB — LIPID PANEL
CHOLESTEROL TOTAL: 180 mg/dL (ref 100–199)
Chol/HDL Ratio: 2.4 ratio (ref 0.0–4.4)
HDL: 75 mg/dL (ref >39)
LDL Calculated: 93 mg/dL (ref 0–99)
TRIGLYCERIDES: 60 mg/dL (ref 0–149)
VLDL CHOLESTEROL CAL: 12 mg/dL (ref 5–40)

## 2017-05-10 LAB — HEPATIC FUNCTION PANEL
ALK PHOS: 115 IU/L (ref 39–117)
ALT: 37 IU/L — ABNORMAL HIGH (ref 0–32)
AST: 20 IU/L (ref 0–40)
Albumin: 4.3 g/dL (ref 3.6–4.8)
BILIRUBIN, DIRECT: 0.1 mg/dL (ref 0.00–0.40)
Bilirubin Total: 0.4 mg/dL (ref 0.0–1.2)
Total Protein: 7.1 g/dL (ref 6.0–8.5)

## 2017-05-10 NOTE — Patient Instructions (Addendum)
Medication Instructions:  Your physician recommends that you continue on your current medications as directed. Please refer to the Current Medication list given to you today.  Labwork: Your physician recommends that you have lab work today- Lipid and liver panel   Testing/Procedures: NONE  Follow-Up: Your physician wants you to follow-up as needed with Dr. Johnsie Cancel.     If you need a refill on your cardiac medications before your next appointment, please call your pharmacy.

## 2017-05-12 DIAGNOSIS — M7061 Trochanteric bursitis, right hip: Secondary | ICD-10-CM | POA: Insufficient documentation

## 2017-07-05 DIAGNOSIS — M25559 Pain in unspecified hip: Secondary | ICD-10-CM | POA: Diagnosis not present

## 2017-07-10 DIAGNOSIS — M25559 Pain in unspecified hip: Secondary | ICD-10-CM | POA: Diagnosis not present

## 2017-07-17 DIAGNOSIS — M25559 Pain in unspecified hip: Secondary | ICD-10-CM | POA: Diagnosis not present

## 2017-07-27 DIAGNOSIS — M25551 Pain in right hip: Secondary | ICD-10-CM | POA: Diagnosis not present

## 2017-07-27 DIAGNOSIS — M25559 Pain in unspecified hip: Secondary | ICD-10-CM | POA: Diagnosis not present

## 2017-08-02 DIAGNOSIS — M25551 Pain in right hip: Secondary | ICD-10-CM | POA: Diagnosis not present

## 2017-08-02 DIAGNOSIS — E0590 Thyrotoxicosis, unspecified without thyrotoxic crisis or storm: Secondary | ICD-10-CM | POA: Diagnosis not present

## 2017-08-02 DIAGNOSIS — Z923 Personal history of irradiation: Secondary | ICD-10-CM | POA: Diagnosis not present

## 2017-08-09 DIAGNOSIS — E0590 Thyrotoxicosis, unspecified without thyrotoxic crisis or storm: Secondary | ICD-10-CM | POA: Diagnosis not present

## 2017-08-09 DIAGNOSIS — Z923 Personal history of irradiation: Secondary | ICD-10-CM | POA: Diagnosis not present

## 2017-08-09 DIAGNOSIS — Z8639 Personal history of other endocrine, nutritional and metabolic disease: Secondary | ICD-10-CM | POA: Diagnosis not present

## 2017-08-11 DIAGNOSIS — M7061 Trochanteric bursitis, right hip: Secondary | ICD-10-CM | POA: Diagnosis not present

## 2017-08-14 DIAGNOSIS — M7989 Other specified soft tissue disorders: Secondary | ICD-10-CM | POA: Diagnosis not present

## 2017-08-14 DIAGNOSIS — M810 Age-related osteoporosis without current pathological fracture: Secondary | ICD-10-CM | POA: Diagnosis not present

## 2017-08-14 DIAGNOSIS — M7060 Trochanteric bursitis, unspecified hip: Secondary | ICD-10-CM | POA: Diagnosis not present

## 2017-08-14 DIAGNOSIS — M25551 Pain in right hip: Secondary | ICD-10-CM | POA: Diagnosis not present

## 2017-08-14 DIAGNOSIS — Z79899 Other long term (current) drug therapy: Secondary | ICD-10-CM | POA: Diagnosis not present

## 2017-08-14 DIAGNOSIS — R748 Abnormal levels of other serum enzymes: Secondary | ICD-10-CM | POA: Diagnosis not present

## 2017-08-14 DIAGNOSIS — M1990 Unspecified osteoarthritis, unspecified site: Secondary | ICD-10-CM | POA: Diagnosis not present

## 2017-08-14 DIAGNOSIS — M0579 Rheumatoid arthritis with rheumatoid factor of multiple sites without organ or systems involvement: Secondary | ICD-10-CM | POA: Diagnosis not present

## 2017-08-14 DIAGNOSIS — M549 Dorsalgia, unspecified: Secondary | ICD-10-CM | POA: Diagnosis not present

## 2017-08-25 DIAGNOSIS — E0590 Thyrotoxicosis, unspecified without thyrotoxic crisis or storm: Secondary | ICD-10-CM | POA: Diagnosis not present

## 2017-08-28 DIAGNOSIS — Z8639 Personal history of other endocrine, nutritional and metabolic disease: Secondary | ICD-10-CM | POA: Diagnosis not present

## 2017-08-28 DIAGNOSIS — Z923 Personal history of irradiation: Secondary | ICD-10-CM | POA: Diagnosis not present

## 2017-08-28 DIAGNOSIS — E0590 Thyrotoxicosis, unspecified without thyrotoxic crisis or storm: Secondary | ICD-10-CM | POA: Diagnosis not present

## 2017-09-08 DIAGNOSIS — E0590 Thyrotoxicosis, unspecified without thyrotoxic crisis or storm: Secondary | ICD-10-CM | POA: Diagnosis not present

## 2017-10-13 DIAGNOSIS — R609 Edema, unspecified: Secondary | ICD-10-CM | POA: Diagnosis not present

## 2017-10-13 DIAGNOSIS — E0590 Thyrotoxicosis, unspecified without thyrotoxic crisis or storm: Secondary | ICD-10-CM | POA: Diagnosis not present

## 2017-10-24 DIAGNOSIS — E0590 Thyrotoxicosis, unspecified without thyrotoxic crisis or storm: Secondary | ICD-10-CM | POA: Diagnosis not present

## 2017-10-24 DIAGNOSIS — Z6827 Body mass index (BMI) 27.0-27.9, adult: Secondary | ICD-10-CM | POA: Diagnosis not present

## 2017-10-24 DIAGNOSIS — R945 Abnormal results of liver function studies: Secondary | ICD-10-CM | POA: Diagnosis not present

## 2017-10-24 DIAGNOSIS — E039 Hypothyroidism, unspecified: Secondary | ICD-10-CM | POA: Diagnosis not present

## 2017-10-24 DIAGNOSIS — Z923 Personal history of irradiation: Secondary | ICD-10-CM | POA: Diagnosis not present

## 2017-10-24 DIAGNOSIS — Z8639 Personal history of other endocrine, nutritional and metabolic disease: Secondary | ICD-10-CM | POA: Diagnosis not present

## 2017-10-25 DIAGNOSIS — M1990 Unspecified osteoarthritis, unspecified site: Secondary | ICD-10-CM | POA: Diagnosis not present

## 2017-10-25 DIAGNOSIS — M0579 Rheumatoid arthritis with rheumatoid factor of multiple sites without organ or systems involvement: Secondary | ICD-10-CM | POA: Diagnosis not present

## 2017-10-25 DIAGNOSIS — M549 Dorsalgia, unspecified: Secondary | ICD-10-CM | POA: Diagnosis not present

## 2017-10-25 DIAGNOSIS — R748 Abnormal levels of other serum enzymes: Secondary | ICD-10-CM | POA: Diagnosis not present

## 2017-10-25 DIAGNOSIS — Z79899 Other long term (current) drug therapy: Secondary | ICD-10-CM | POA: Diagnosis not present

## 2017-10-25 DIAGNOSIS — M810 Age-related osteoporosis without current pathological fracture: Secondary | ICD-10-CM | POA: Diagnosis not present

## 2017-10-25 DIAGNOSIS — R892 Abnormal level of other drugs, medicaments and biological substances in specimens from other organs, systems and tissues: Secondary | ICD-10-CM | POA: Diagnosis not present

## 2017-10-26 DIAGNOSIS — R945 Abnormal results of liver function studies: Secondary | ICD-10-CM | POA: Diagnosis not present

## 2017-11-08 DIAGNOSIS — R945 Abnormal results of liver function studies: Secondary | ICD-10-CM | POA: Diagnosis not present

## 2017-11-08 DIAGNOSIS — M0579 Rheumatoid arthritis with rheumatoid factor of multiple sites without organ or systems involvement: Secondary | ICD-10-CM | POA: Diagnosis not present

## 2017-11-08 DIAGNOSIS — E663 Overweight: Secondary | ICD-10-CM | POA: Diagnosis not present

## 2017-11-16 DIAGNOSIS — Z79899 Other long term (current) drug therapy: Secondary | ICD-10-CM | POA: Diagnosis not present

## 2017-11-16 DIAGNOSIS — M0579 Rheumatoid arthritis with rheumatoid factor of multiple sites without organ or systems involvement: Secondary | ICD-10-CM | POA: Diagnosis not present

## 2017-11-22 DIAGNOSIS — M0579 Rheumatoid arthritis with rheumatoid factor of multiple sites without organ or systems involvement: Secondary | ICD-10-CM | POA: Diagnosis not present

## 2017-11-22 DIAGNOSIS — M549 Dorsalgia, unspecified: Secondary | ICD-10-CM | POA: Diagnosis not present

## 2017-11-22 DIAGNOSIS — Z23 Encounter for immunization: Secondary | ICD-10-CM | POA: Diagnosis not present

## 2017-11-22 DIAGNOSIS — Z7189 Other specified counseling: Secondary | ICD-10-CM | POA: Diagnosis not present

## 2017-11-22 DIAGNOSIS — Z79899 Other long term (current) drug therapy: Secondary | ICD-10-CM | POA: Diagnosis not present

## 2017-11-22 DIAGNOSIS — M1990 Unspecified osteoarthritis, unspecified site: Secondary | ICD-10-CM | POA: Diagnosis not present

## 2017-11-22 DIAGNOSIS — M810 Age-related osteoporosis without current pathological fracture: Secondary | ICD-10-CM | POA: Diagnosis not present

## 2017-11-22 DIAGNOSIS — R748 Abnormal levels of other serum enzymes: Secondary | ICD-10-CM | POA: Diagnosis not present

## 2017-11-30 DIAGNOSIS — M0579 Rheumatoid arthritis with rheumatoid factor of multiple sites without organ or systems involvement: Secondary | ICD-10-CM | POA: Diagnosis not present

## 2017-12-13 DIAGNOSIS — Z923 Personal history of irradiation: Secondary | ICD-10-CM | POA: Diagnosis not present

## 2017-12-20 DIAGNOSIS — Z923 Personal history of irradiation: Secondary | ICD-10-CM | POA: Diagnosis not present

## 2017-12-20 DIAGNOSIS — Z8639 Personal history of other endocrine, nutritional and metabolic disease: Secondary | ICD-10-CM | POA: Diagnosis not present

## 2017-12-20 DIAGNOSIS — Z6827 Body mass index (BMI) 27.0-27.9, adult: Secondary | ICD-10-CM | POA: Diagnosis not present

## 2017-12-20 DIAGNOSIS — E0590 Thyrotoxicosis, unspecified without thyrotoxic crisis or storm: Secondary | ICD-10-CM | POA: Diagnosis not present

## 2017-12-20 DIAGNOSIS — E039 Hypothyroidism, unspecified: Secondary | ICD-10-CM | POA: Diagnosis not present

## 2017-12-27 DIAGNOSIS — Z79899 Other long term (current) drug therapy: Secondary | ICD-10-CM | POA: Diagnosis not present

## 2017-12-27 DIAGNOSIS — M0579 Rheumatoid arthritis with rheumatoid factor of multiple sites without organ or systems involvement: Secondary | ICD-10-CM | POA: Diagnosis not present

## 2018-01-04 DIAGNOSIS — N390 Urinary tract infection, site not specified: Secondary | ICD-10-CM | POA: Diagnosis not present

## 2018-01-04 DIAGNOSIS — E039 Hypothyroidism, unspecified: Secondary | ICD-10-CM | POA: Diagnosis not present

## 2018-01-04 DIAGNOSIS — Z0000 Encounter for general adult medical examination without abnormal findings: Secondary | ICD-10-CM | POA: Diagnosis not present

## 2018-01-04 DIAGNOSIS — E0590 Thyrotoxicosis, unspecified without thyrotoxic crisis or storm: Secondary | ICD-10-CM | POA: Diagnosis not present

## 2018-01-09 DIAGNOSIS — I10 Essential (primary) hypertension: Secondary | ICD-10-CM | POA: Diagnosis not present

## 2018-01-09 DIAGNOSIS — M0579 Rheumatoid arthritis with rheumatoid factor of multiple sites without organ or systems involvement: Secondary | ICD-10-CM | POA: Diagnosis not present

## 2018-01-09 DIAGNOSIS — Z23 Encounter for immunization: Secondary | ICD-10-CM | POA: Diagnosis not present

## 2018-01-09 DIAGNOSIS — E039 Hypothyroidism, unspecified: Secondary | ICD-10-CM | POA: Diagnosis not present

## 2018-01-09 DIAGNOSIS — Z0000 Encounter for general adult medical examination without abnormal findings: Secondary | ICD-10-CM | POA: Diagnosis not present

## 2018-01-17 DIAGNOSIS — R748 Abnormal levels of other serum enzymes: Secondary | ICD-10-CM | POA: Diagnosis not present

## 2018-01-17 DIAGNOSIS — M810 Age-related osteoporosis without current pathological fracture: Secondary | ICD-10-CM | POA: Diagnosis not present

## 2018-01-17 DIAGNOSIS — M549 Dorsalgia, unspecified: Secondary | ICD-10-CM | POA: Diagnosis not present

## 2018-01-17 DIAGNOSIS — M0579 Rheumatoid arthritis with rheumatoid factor of multiple sites without organ or systems involvement: Secondary | ICD-10-CM | POA: Diagnosis not present

## 2018-01-17 DIAGNOSIS — Z79899 Other long term (current) drug therapy: Secondary | ICD-10-CM | POA: Diagnosis not present

## 2018-01-17 DIAGNOSIS — M1990 Unspecified osteoarthritis, unspecified site: Secondary | ICD-10-CM | POA: Diagnosis not present

## 2018-01-31 DIAGNOSIS — M0579 Rheumatoid arthritis with rheumatoid factor of multiple sites without organ or systems involvement: Secondary | ICD-10-CM | POA: Diagnosis not present

## 2018-01-31 DIAGNOSIS — E039 Hypothyroidism, unspecified: Secondary | ICD-10-CM | POA: Diagnosis not present

## 2018-02-07 DIAGNOSIS — E0590 Thyrotoxicosis, unspecified without thyrotoxic crisis or storm: Secondary | ICD-10-CM | POA: Diagnosis not present

## 2018-02-07 DIAGNOSIS — Z923 Personal history of irradiation: Secondary | ICD-10-CM | POA: Diagnosis not present

## 2018-02-07 DIAGNOSIS — E039 Hypothyroidism, unspecified: Secondary | ICD-10-CM | POA: Diagnosis not present

## 2018-02-07 DIAGNOSIS — Z8639 Personal history of other endocrine, nutritional and metabolic disease: Secondary | ICD-10-CM | POA: Diagnosis not present

## 2018-02-07 DIAGNOSIS — L659 Nonscarring hair loss, unspecified: Secondary | ICD-10-CM | POA: Diagnosis not present

## 2018-02-07 DIAGNOSIS — Z6827 Body mass index (BMI) 27.0-27.9, adult: Secondary | ICD-10-CM | POA: Diagnosis not present

## 2018-02-12 DIAGNOSIS — L659 Nonscarring hair loss, unspecified: Secondary | ICD-10-CM | POA: Diagnosis not present

## 2018-02-15 DIAGNOSIS — M0579 Rheumatoid arthritis with rheumatoid factor of multiple sites without organ or systems involvement: Secondary | ICD-10-CM | POA: Diagnosis not present

## 2018-02-26 DIAGNOSIS — Z79899 Other long term (current) drug therapy: Secondary | ICD-10-CM | POA: Diagnosis not present

## 2018-02-26 DIAGNOSIS — M0579 Rheumatoid arthritis with rheumatoid factor of multiple sites without organ or systems involvement: Secondary | ICD-10-CM | POA: Diagnosis not present

## 2018-04-19 DIAGNOSIS — M0579 Rheumatoid arthritis with rheumatoid factor of multiple sites without organ or systems involvement: Secondary | ICD-10-CM | POA: Diagnosis not present

## 2018-04-19 DIAGNOSIS — M1990 Unspecified osteoarthritis, unspecified site: Secondary | ICD-10-CM | POA: Diagnosis not present

## 2018-04-19 DIAGNOSIS — M549 Dorsalgia, unspecified: Secondary | ICD-10-CM | POA: Diagnosis not present

## 2018-04-19 DIAGNOSIS — Z79899 Other long term (current) drug therapy: Secondary | ICD-10-CM | POA: Diagnosis not present

## 2018-05-15 DIAGNOSIS — Z923 Personal history of irradiation: Secondary | ICD-10-CM | POA: Diagnosis not present

## 2018-05-15 DIAGNOSIS — Z8639 Personal history of other endocrine, nutritional and metabolic disease: Secondary | ICD-10-CM | POA: Diagnosis not present

## 2018-05-15 DIAGNOSIS — E039 Hypothyroidism, unspecified: Secondary | ICD-10-CM | POA: Diagnosis not present

## 2018-05-15 DIAGNOSIS — L659 Nonscarring hair loss, unspecified: Secondary | ICD-10-CM | POA: Diagnosis not present

## 2018-05-15 DIAGNOSIS — R945 Abnormal results of liver function studies: Secondary | ICD-10-CM | POA: Diagnosis not present

## 2018-05-15 DIAGNOSIS — Z79899 Other long term (current) drug therapy: Secondary | ICD-10-CM | POA: Diagnosis not present

## 2018-07-17 DIAGNOSIS — Z79899 Other long term (current) drug therapy: Secondary | ICD-10-CM | POA: Diagnosis not present

## 2018-07-17 DIAGNOSIS — M0579 Rheumatoid arthritis with rheumatoid factor of multiple sites without organ or systems involvement: Secondary | ICD-10-CM | POA: Diagnosis not present

## 2018-07-17 DIAGNOSIS — M549 Dorsalgia, unspecified: Secondary | ICD-10-CM | POA: Diagnosis not present

## 2018-07-17 DIAGNOSIS — Z8639 Personal history of other endocrine, nutritional and metabolic disease: Secondary | ICD-10-CM | POA: Diagnosis not present

## 2018-07-17 DIAGNOSIS — M1990 Unspecified osteoarthritis, unspecified site: Secondary | ICD-10-CM | POA: Diagnosis not present

## 2018-09-02 ENCOUNTER — Emergency Department (HOSPITAL_COMMUNITY)
Admission: EM | Admit: 2018-09-02 | Discharge: 2018-09-02 | Disposition: A | Discharge status: Home / Self Care | Payer: Medicare Other | Attending: Emergency Medicine | Admitting: Emergency Medicine

## 2018-09-02 ENCOUNTER — Other Ambulatory Visit: Payer: Self-pay

## 2018-09-02 ENCOUNTER — Emergency Department (HOSPITAL_COMMUNITY): Payer: Medicare Other

## 2018-09-02 DIAGNOSIS — Z79899 Other long term (current) drug therapy: Secondary | ICD-10-CM | POA: Insufficient documentation

## 2018-09-02 DIAGNOSIS — R42 Dizziness and giddiness: Secondary | ICD-10-CM | POA: Diagnosis not present

## 2018-09-02 DIAGNOSIS — Z87891 Personal history of nicotine dependence: Secondary | ICD-10-CM | POA: Insufficient documentation

## 2018-09-02 DIAGNOSIS — H538 Other visual disturbances: Secondary | ICD-10-CM | POA: Insufficient documentation

## 2018-09-02 DIAGNOSIS — H539 Unspecified visual disturbance: Secondary | ICD-10-CM

## 2018-09-02 DIAGNOSIS — R110 Nausea: Secondary | ICD-10-CM | POA: Diagnosis not present

## 2018-09-02 DIAGNOSIS — I10 Essential (primary) hypertension: Secondary | ICD-10-CM | POA: Insufficient documentation

## 2018-09-02 DIAGNOSIS — H547 Unspecified visual loss: Secondary | ICD-10-CM | POA: Diagnosis not present

## 2018-09-02 LAB — COMPREHENSIVE METABOLIC PANEL
ALT: 17 U/L (ref 0–44)
AST: 18 U/L (ref 15–41)
Albumin: 3.8 g/dL (ref 3.5–5.0)
Alkaline Phosphatase: 79 U/L (ref 38–126)
Anion gap: 13 (ref 5–15)
BUN: 21 mg/dL (ref 8–23)
CO2: 24 mmol/L (ref 22–32)
Calcium: 9.8 mg/dL (ref 8.9–10.3)
Chloride: 102 mmol/L (ref 98–111)
Creatinine, Ser: 0.87 mg/dL (ref 0.44–1.00)
GFR calc Af Amer: >60 mL/min (ref >60)
GFR calc non Af Amer: >60 mL/min (ref >60)
Glucose, Bld: 124 mg/dL — ABNORMAL HIGH (ref 70–99)
Potassium: 3.4 mmol/L — ABNORMAL LOW (ref 3.5–5.1)
Sodium: 139 mmol/L (ref 135–145)
Total Bilirubin: 0.5 mg/dL (ref 0.3–1.2)
Total Protein: 6.7 g/dL (ref 6.5–8.1)

## 2018-09-02 LAB — CBC WITH DIFFERENTIAL/PLATELET
Abs Immature Granulocytes: 0.03 10*3/uL (ref 0.00–0.07)
Basophils Absolute: 0.1 10*3/uL (ref 0.0–0.1)
Basophils Relative: 1 %
Eosinophils Absolute: 0.3 10*3/uL (ref 0.0–0.5)
Eosinophils Relative: 3 %
HCT: 40.2 % (ref 36.0–46.0)
Hemoglobin: 13.3 g/dL (ref 12.0–15.0)
Immature Granulocytes: 0 %
Lymphocytes Relative: 20 %
Lymphs Abs: 1.6 10*3/uL (ref 0.7–4.0)
MCH: 29.8 pg (ref 26.0–34.0)
MCHC: 33.1 g/dL (ref 30.0–36.0)
MCV: 89.9 fL (ref 80.0–100.0)
Monocytes Absolute: 0.7 10*3/uL (ref 0.1–1.0)
Monocytes Relative: 8 %
Neutro Abs: 5.7 10*3/uL (ref 1.7–7.7)
Neutrophils Relative %: 68 %
Platelets: 310 10*3/uL (ref 150–400)
RBC: 4.47 MIL/uL (ref 3.87–5.11)
RDW: 13.3 % (ref 11.5–15.5)
WBC: 8.3 10*3/uL (ref 4.0–10.5)
nRBC: 0 % (ref 0.0–0.2)

## 2018-09-02 LAB — URINALYSIS, ROUTINE W REFLEX MICROSCOPIC
Bilirubin Urine: NEGATIVE
Glucose, UA: NEGATIVE mg/dL
Hgb urine dipstick: NEGATIVE
Ketones, ur: NEGATIVE mg/dL
Leukocytes,Ua: NEGATIVE
Nitrite: NEGATIVE
Protein, ur: NEGATIVE mg/dL
Specific Gravity, Urine: 1.01 (ref 1.005–1.030)
pH: 6 (ref 5.0–8.0)

## 2018-09-02 MED ORDER — SODIUM CHLORIDE 0.9 % IV BOLUS
500.0000 mL | Freq: Once | INTRAVENOUS | Status: AC
  Administered 2018-09-02: 500 mL via INTRAVENOUS

## 2018-09-02 MED ORDER — POTASSIUM CHLORIDE CRYS ER 20 MEQ PO TBCR
20.0000 meq | EXTENDED_RELEASE_TABLET | Freq: Once | ORAL | Status: AC
  Administered 2018-09-02: 20 meq via ORAL
  Filled 2018-09-02: qty 1

## 2018-09-02 NOTE — ED Triage Notes (Signed)
Pt arrives ems with co visual changes in the right eye. Pt was pressure washing outside this morning and got over heated and took a shower. Pt states that her eye was looking like a prisym.

## 2018-09-02 NOTE — ED Provider Notes (Signed)
[facility] EMERGENCY DEPARTMENT Provider Note   CSN: [id number] Arrival date & time: 09/02/18  1420    History   Chief Complaint Chief Complaint  Patient presents with  . visual changes    HPI Brandy Townsend is a 68 y.o. female.     The history is provided by the patient and medical records. No language interpreter was used.   Brandy Townsend is a 68 y.o. female who presents to the Emergency Department complaining of visual changes. She has a history of thyroid disease and hypertension and presents to the emergency department today for evaluation following an episode of dizziness with visual changes. She was working outside in the heat today but had gone inside to cool down. She experienced an episode about 15 minutes of a sensation of diagonal prism type light in her right eye. She had associated lightheadedness with this. She denies any near syncopal or unsteady sensation. The prism like vision changes resolved and then she had brief blurred vision and now her vision is back to baseline. She thinks she may have been overheated and this triggered her symptoms. She denies any fevers, headache, vomiting, numbness, weakness, loss of vision. No recent illnesses. No known coronavirus exposures. She is caring for her husband, who just was discharged from the hospital following treatment for esophageal cancer.   Past Medical History:  Diagnosis Date  . Graves disease   . Hypertension   . Osteoporosis   . Rheumatoid arthritis ([place])   . Thyroid disease   . Vertigo     Patient Active Problem List   Diagnosis Date Noted  . Rheumatoid arthritis ([place]) 01/14/2017  . Pain in both hands 05/12/2016  . Chronic left shoulder pain 05/12/2016  . Chronic sinusitis of both maxillary sinuses 03/24/2016  . 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 Chest pain syndrome 05/16/2015  . HTN (hypertension) 05/16/2015  . Hyperthyroidism 05/16/2015  . Prerenal azotemia  05/16/2015    No past surgical history on file.   OB History   No obstetric history on file.      Home Medications    Prior to Admission medications   Medication Sig Start Date End Date Taking? Authorizing Provider  acetaminophen (TYLENOL) 325 MG tablet Take 325 mg by mouth every 6 (six) hours as needed for moderate pain.   Yes [provider]  cetirizine (ZYRTEC) 10 MG tablet Take 10 mg by mouth at bedtime.   Yes [provider]  fluticasone (FLONASE) 50 MCG/ACT nasal spray Place 1 spray into both nostrils as needed for allergies.    Yes [provider]  ibuprofen (ADVIL) 200 MG tablet Take 200 mg by mouth every 6 (six) hours as needed for moderate pain.   Yes [provider]  KLOR-CON 10 10 MEQ tablet Take 10 mEq by mouth daily. 08/11/14  Yes [provider]  levothyroxine (SYNTHROID) 50 MCG tablet Take 1 tablet by mouth every morning. On an empty stomach every day except sunday 08/16/18  Yes [provider]  torsemide (DEMADEX) 20 MG tablet Take 20 mg by mouth every other day. 04/26/16  Yes [provider]  verapamil (CALAN-SR) 120 MG CR tablet Take 120 mg by mouth daily. 08/20/18  Yes [provider]  meclizine (ANTIVERT) 25 MG tablet Take 1 tablet (25 mg total) 3 (three) times daily as needed by mouth for dizziness. Patient not taking: Reported on 09/02/2018 02/02/17   Waynetta Pean, PA-C  ondansetron (  ZOFRAN ODT) 4 MG disintegrating tablet Take 1 tablet (4 mg total) every 8 (eight) hours as needed by mouth for nausea or vomiting. Patient not taking: Reported on 09/02/2018 02/02/17   Waynetta Pean, PA-C    Family History No family history on file.  Social History Social History   Tobacco Use  . Smoking status: Former Smoker    Last attempt to quit: 03/28/2009    Years since quitting: 9.4  . Smokeless tobacco: Never Used  Substance Use Topics  . Alcohol use: No  . Drug use: No     Allergies   Macrobid  [nitrofurantoin monohyd macro]   Review of Systems Review of Systems  All other systems reviewed and are negative.    Physical Exam Updated Vital Signs BP (!) 111/99   Pulse 67   Temp 97.9 F (36.6 C) (Oral)   Resp 15   SpO2 97%   Physical Exam Vitals signs and nursing note reviewed.  Constitutional:      Appearance: She is well-developed.  HENT:     Head: Normocephalic and atraumatic.     Right Ear: Tympanic membrane normal.     Left Ear: Tympanic membrane normal.  Cardiovascular:     Rate and Rhythm: Normal rate and regular rhythm.     Heart sounds: No murmur.  Pulmonary:     Effort: Pulmonary effort is normal. No respiratory distress.     Breath sounds: Normal breath sounds.  Abdominal:     Palpations: Abdomen is soft.     Tenderness: There is no abdominal tenderness. There is no guarding or rebound.  Musculoskeletal:        General: No tenderness.  Skin:    General: Skin is warm and dry.     Capillary Refill: Capillary refill takes less than 2 seconds.  Neurological:     Mental Status: She is alert and oriented to person, place, and time.     Comments: Pupils equal round and reactive, EOMI. No asymmetry of facial muscles. Visual fields are grossly intact. No pronated or drift. Five out of five strength in all four extremities with sensation to light touch intact in all four extremities. No ataxia on finger to nose bilaterally.  Psychiatric:        Behavior: Behavior normal.      ED Treatments / Results  Labs (all labs ordered are listed, but only abnormal results are displayed) Labs Reviewed  COMPREHENSIVE METABOLIC PANEL - Abnormal; Notable for the following components:      Result Value   Potassium 3.4 (*)    Glucose, Bld 124 (*)    All other components within normal limits  CBC WITH DIFFERENTIAL/PLATELET  URINALYSIS, ROUTINE W REFLEX MICROSCOPIC    EKG EKG Interpretation  Date/Time:  Sunday September 02 2018 14:25:30 EDT Ventricular Rate:  69 PR  Interval:    QRS Duration: 83 QT Interval:  373 QTC Calculation: 400 R Axis:   62 Text Interpretation:  Sinus rhythm Abnormal R-wave progression, early transition Confirmed by Quintella Reichert [id number]) on 09/02/2018 3:09:17 PM   Radiology Ct Head Wo Contrast  Result Date: 09/02/2018 CLINICAL DATA:  Visual changes, right eye EXAM: CT HEAD WITHOUT CONTRAST TECHNIQUE: Contiguous axial images were obtained from the base of the skull through the vertex without intravenous contrast. COMPARISON:  02/01/2017 FINDINGS: Brain: No evidence of acute infarction, hemorrhage, hydrocephalus, extra-axial collection or mass lesion/mass effect. Vascular: No hyperdense vessel or unexpected calcification. Skull: Normal. Negative for fracture or focal lesion. Sinuses/Orbits: Small  air-fluid level of the right maxillary sinus. Partial opacification and mucosal thickening of the left maxillary sinus, these findings similar to prior examination. Other: None. IMPRESSION: 1. No acute intracranial pathology. No non-contrast CT findings to explain visual changes in the right eye. 2. Small air-fluid level of the right maxillary sinus. Partial opacification and mucosal thickening of the left maxillary sinus, these findings similar to prior examination. Correlate for sinusitis. Electronically Signed   By: Eddie Candle M.D.   On: 09/02/2018 16:23    Procedures Procedures (including critical care time)  Medications Ordered in ED Medications  potassium chloride SA (K-DUR) CR tablet 20 mEq (has no administration in time range)  sodium chloride 0.9 % bolus 500 mL (500 mLs Intravenous New Bag/Given 09/02/18 1626)     Initial Impression / Assessment and Plan / ED Course  I have reviewed the triage vital signs and the nursing notes.  Pertinent labs & imaging results that were available during my care of the patient were reviewed by me and considered in my medical decision making (see chart for details).        Patient here for  evaluation following a brief episode of dizziness with a sensation of a rainbow prism with diagonal stripes. Her episode completely resolved after between 12 and 14 minutes. On evaluation in the emergency department she is neurologically intact. Presentation is not consistent with TIA/CVA, subarachnoid hemorrhage, aneurysm, retinal detachment. Discussed with patient unclear source of symptoms. Plan to discharge home with outpatient PCP/optho follow up and return precautions.    Presentation is not c/w acute covid infection.  Brandy Townsend was evaluated in Emergency Department on 09/02/2018 for the symptoms described in the history of present illness. She was evaluated in the context of the global COVID-19 pandemic, which necessitated consideration that the patient might be at risk for infection with the SARS-CoV-2 virus that causes COVID-19. Institutional protocols and algorithms that pertain to the evaluation of patients at risk for COVID-19 are in a state of rapid change based on information released by regulatory bodies including the CDC and federal and state organizations. These policies and algorithms were followed during the patient's care in the ED.   Final Clinical Impressions(s) / ED Diagnoses   Final diagnoses:  Dizziness  Visual changes    ED Discharge Orders    None       Quintella Reichert, MD 09/02/18 1708

## 2018-09-02 NOTE — ED Notes (Signed)
Pt back from CT

## 2018-09-03 DIAGNOSIS — G43101 Migraine with aura, not intractable, with status migrainosus: Secondary | ICD-10-CM | POA: Diagnosis not present

## 2018-09-04 DIAGNOSIS — I10 Essential (primary) hypertension: Secondary | ICD-10-CM | POA: Diagnosis not present

## 2018-09-04 DIAGNOSIS — F419 Anxiety disorder, unspecified: Secondary | ICD-10-CM | POA: Diagnosis not present

## 2018-09-18 DIAGNOSIS — Z79899 Other long term (current) drug therapy: Secondary | ICD-10-CM | POA: Diagnosis not present

## 2018-09-18 DIAGNOSIS — M549 Dorsalgia, unspecified: Secondary | ICD-10-CM | POA: Diagnosis not present

## 2018-09-18 DIAGNOSIS — M1990 Unspecified osteoarthritis, unspecified site: Secondary | ICD-10-CM | POA: Diagnosis not present

## 2018-09-18 DIAGNOSIS — M0579 Rheumatoid arthritis with rheumatoid factor of multiple sites without organ or systems involvement: Secondary | ICD-10-CM | POA: Diagnosis not present

## 2018-10-25 DIAGNOSIS — M0579 Rheumatoid arthritis with rheumatoid factor of multiple sites without organ or systems involvement: Secondary | ICD-10-CM | POA: Diagnosis not present

## 2018-11-13 DIAGNOSIS — Z6825 Body mass index (BMI) 25.0-25.9, adult: Secondary | ICD-10-CM | POA: Diagnosis not present

## 2018-11-13 DIAGNOSIS — Z923 Personal history of irradiation: Secondary | ICD-10-CM | POA: Diagnosis not present

## 2018-11-13 DIAGNOSIS — Z79899 Other long term (current) drug therapy: Secondary | ICD-10-CM | POA: Diagnosis not present

## 2018-11-13 DIAGNOSIS — Z8639 Personal history of other endocrine, nutritional and metabolic disease: Secondary | ICD-10-CM | POA: Diagnosis not present

## 2018-11-13 DIAGNOSIS — M0579 Rheumatoid arthritis with rheumatoid factor of multiple sites without organ or systems involvement: Secondary | ICD-10-CM | POA: Diagnosis not present

## 2018-11-13 DIAGNOSIS — E039 Hypothyroidism, unspecified: Secondary | ICD-10-CM | POA: Diagnosis not present

## 2018-11-23 DIAGNOSIS — M0579 Rheumatoid arthritis with rheumatoid factor of multiple sites without organ or systems involvement: Secondary | ICD-10-CM | POA: Diagnosis not present

## 2018-11-23 DIAGNOSIS — Z79899 Other long term (current) drug therapy: Secondary | ICD-10-CM | POA: Diagnosis not present

## 2018-11-23 DIAGNOSIS — M1990 Unspecified osteoarthritis, unspecified site: Secondary | ICD-10-CM | POA: Diagnosis not present

## 2018-11-23 DIAGNOSIS — M549 Dorsalgia, unspecified: Secondary | ICD-10-CM | POA: Diagnosis not present

## 2018-11-28 DIAGNOSIS — H5213 Myopia, bilateral: Secondary | ICD-10-CM | POA: Diagnosis not present

## 2018-12-14 DIAGNOSIS — E0590 Thyrotoxicosis, unspecified without thyrotoxic crisis or storm: Secondary | ICD-10-CM | POA: Diagnosis not present

## 2018-12-17 DIAGNOSIS — J0110 Acute frontal sinusitis, unspecified: Secondary | ICD-10-CM | POA: Diagnosis not present

## 2019-01-08 DIAGNOSIS — I10 Essential (primary) hypertension: Secondary | ICD-10-CM | POA: Diagnosis not present

## 2019-01-08 DIAGNOSIS — M1990 Unspecified osteoarthritis, unspecified site: Secondary | ICD-10-CM | POA: Diagnosis not present

## 2019-01-08 DIAGNOSIS — Z8639 Personal history of other endocrine, nutritional and metabolic disease: Secondary | ICD-10-CM | POA: Diagnosis not present

## 2019-01-08 DIAGNOSIS — E0590 Thyrotoxicosis, unspecified without thyrotoxic crisis or storm: Secondary | ICD-10-CM | POA: Diagnosis not present

## 2019-01-11 DIAGNOSIS — Z23 Encounter for immunization: Secondary | ICD-10-CM | POA: Diagnosis not present

## 2019-01-14 DIAGNOSIS — Z0000 Encounter for general adult medical examination without abnormal findings: Secondary | ICD-10-CM | POA: Diagnosis not present

## 2019-01-14 DIAGNOSIS — J4541 Moderate persistent asthma with (acute) exacerbation: Secondary | ICD-10-CM | POA: Diagnosis not present

## 2019-01-14 DIAGNOSIS — E039 Hypothyroidism, unspecified: Secondary | ICD-10-CM | POA: Diagnosis not present

## 2019-02-04 DIAGNOSIS — H9202 Otalgia, left ear: Secondary | ICD-10-CM | POA: Diagnosis not present

## 2019-02-04 DIAGNOSIS — H7291 Unspecified perforation of tympanic membrane, right ear: Secondary | ICD-10-CM | POA: Diagnosis not present

## 2019-02-05 DIAGNOSIS — E0590 Thyrotoxicosis, unspecified without thyrotoxic crisis or storm: Secondary | ICD-10-CM | POA: Diagnosis not present

## 2019-02-14 DIAGNOSIS — J4530 Mild persistent asthma, uncomplicated: Secondary | ICD-10-CM | POA: Diagnosis not present

## 2019-02-18 DIAGNOSIS — Z1231 Encounter for screening mammogram for malignant neoplasm of breast: Secondary | ICD-10-CM | POA: Diagnosis not present

## 2019-02-26 DIAGNOSIS — M0579 Rheumatoid arthritis with rheumatoid factor of multiple sites without organ or systems involvement: Secondary | ICD-10-CM | POA: Diagnosis not present

## 2019-02-26 DIAGNOSIS — M1990 Unspecified osteoarthritis, unspecified site: Secondary | ICD-10-CM | POA: Diagnosis not present

## 2019-02-26 DIAGNOSIS — M549 Dorsalgia, unspecified: Secondary | ICD-10-CM | POA: Diagnosis not present

## 2019-02-26 DIAGNOSIS — Z79899 Other long term (current) drug therapy: Secondary | ICD-10-CM | POA: Diagnosis not present

## 2019-03-18 ENCOUNTER — Ambulatory Visit: Payer: Medicare Other | Attending: Internal Medicine

## 2019-03-18 DIAGNOSIS — Z20828 Contact with and (suspected) exposure to other viral communicable diseases: Secondary | ICD-10-CM | POA: Diagnosis not present

## 2019-03-18 DIAGNOSIS — J441 Chronic obstructive pulmonary disease with (acute) exacerbation: Secondary | ICD-10-CM | POA: Diagnosis not present

## 2019-03-18 DIAGNOSIS — Z20822 Contact with and (suspected) exposure to covid-19: Secondary | ICD-10-CM

## 2019-03-18 DIAGNOSIS — R05 Cough: Secondary | ICD-10-CM | POA: Diagnosis not present

## 2019-03-19 LAB — NOVEL CORONAVIRUS, NAA: SARS-CoV-2, NAA: NOT DETECTED

## 2019-03-27 DIAGNOSIS — I10 Essential (primary) hypertension: Secondary | ICD-10-CM | POA: Diagnosis not present

## 2019-03-27 DIAGNOSIS — E039 Hypothyroidism, unspecified: Secondary | ICD-10-CM | POA: Diagnosis not present

## 2019-03-27 DIAGNOSIS — R05 Cough: Secondary | ICD-10-CM | POA: Diagnosis not present

## 2019-04-17 ENCOUNTER — Ambulatory Visit: Payer: Medicare Other | Attending: Internal Medicine

## 2019-04-17 DIAGNOSIS — Z23 Encounter for immunization: Secondary | ICD-10-CM

## 2019-04-17 NOTE — Progress Notes (Signed)
   Covid-19 Vaccination Clinic  Name:  Brandy Townsend    MRN: [id number] DOB: 03-Jan-1951  04/17/2019  [name] was observed post Covid-19 immunization for 15 minutes without incidence. She was provided with Vaccine Information Sheet and instruction to access the V-Safe system.   [name] was instructed to call 911 with any severe reactions post vaccine: Marland Kitchen Difficulty breathing  . Swelling of your face and throat  . A fast heartbeat  . A bad rash all over your body  . Dizziness and weakness    Immunizations Administered    Name Date Dose VIS Date Route   Pfizer COVID-19 Vaccine 04/17/2019 11:06 AM 0.3 mL 03/08/2019 Intramuscular   Manufacturer: [place]   Lot: [id number]   [place]: [id number]

## 2019-04-30 DIAGNOSIS — J441 Chronic obstructive pulmonary disease with (acute) exacerbation: Secondary | ICD-10-CM | POA: Diagnosis not present

## 2019-04-30 DIAGNOSIS — R05 Cough: Secondary | ICD-10-CM | POA: Diagnosis not present

## 2019-05-01 ENCOUNTER — Ambulatory Visit: Payer: Medicare Other

## 2019-05-06 ENCOUNTER — Ambulatory Visit: Payer: Medicare Other | Attending: Internal Medicine

## 2019-05-06 DIAGNOSIS — Z23 Encounter for immunization: Secondary | ICD-10-CM | POA: Insufficient documentation

## 2019-05-06 NOTE — Progress Notes (Signed)
   Covid-19 Vaccination Clinic  Name:  DEEDRE GARTHE    MRN: [id number] DOB: Dec 18, 1950  05/06/2019  Ms. Spangenberg was observed post Covid-19 immunization for 15 minutes without incidence. She was provided with Vaccine Information Sheet and instruction to access the V-Safe system.   Ms. Liedtke was instructed to call 911 with any severe reactions post vaccine: Marland Kitchen Difficulty breathing  . Swelling of your face and throat  . A fast heartbeat  . A bad rash all over your body  . Dizziness and weakness    Immunizations Administered    Name Date Dose VIS Date Route   Pfizer COVID-19 Vaccine 05/06/2019  4:15 PM 0.3 mL 03/08/2019 Intramuscular   Manufacturer: [place]   Lot: [id number]   [place]: [id number]

## 2019-05-07 NOTE — Progress Notes (Deleted)
CARDIOLOGY OFFICE NOTE  Date:  05/07/2019    Brandy Townsend Date of Birth: 03-Sep-1950 Medical Record [id number]  PCP:  Jani Gravel, MD  Cardiologist:  Johnsie Cancel   No chief complaint on file.   History of Present Illness: Brandy Townsend is a 70 y.o. female who presents today for a 2 year check. Seen for Dr. Johnsie Cancel.   She has a history of palpitations - prior ER visit in 2016, prior hospitalization in 2017 for chest pain with normal Myoview and normal Echo. Other issues include HTN, HLD, RA - on Methotrexate, asthma (seen Dr. Melvyn Novas), prior smoking history and thyroid ablation.   Last seen by Dr. Johnsie Cancel in 04/2017.   The patient {does/does not:200015} have symptoms concerning for COVID-19 infection (fever, chills, cough, or new shortness of breath).   Comes in today. Here with   Past Medical History:  Diagnosis Date  . Graves disease   . Hypertension   . Osteoporosis   . Rheumatoid arthritis ([place])   . Thyroid disease   . Vertigo     No past surgical history on file.   Medications: No outpatient medications have been marked as taking for the 05/08/19 encounter (Appointment) with Burtis Junes, NP.     Allergies: Allergies  Allergen Reactions  . Macrobid [employer] Macro] Other (See Comments)    Lowered potassium, caused aches and pains    Social History: The patient  reports that she quit smoking about 10 years ago. She has never used smokeless tobacco. She reports that she does not drink alcohol or use drugs.   Family History: The patient's ***family history is not on file.   Review of Systems: Please see the history of present illness.   All other systems are reviewed and negative.   Physical Exam: VS:  There were no vitals taken for this visit. Marland Kitchen  BMI There is no height or weight on file to calculate BMI.  Wt Readings from Last 3 Encounters:  05/10/17 156 lb (70.8 kg)  02/01/17 150 lb (68 kg)  01/13/17 156 lb (70.8 kg)    General:  Pleasant. Well developed, well nourished and in no acute distress.   HEENT: Normal.  Neck: Supple, no JVD, carotid bruits, or masses noted.  Cardiac: ***Regular rate and rhythm. No murmurs, rubs, or gallops. No edema.  Respiratory:  Lungs are clear to auscultation bilaterally with normal work of breathing.  GI: Soft and nontender.  MS: No deformity or atrophy. Gait and ROM intact.  Skin: Warm and dry. Color is normal.  Neuro:  Strength and sensation are intact and no gross focal deficits noted.  Psych: Alert, appropriate and with normal affect.   LABORATORY DATA:  EKG:  EKG {ACTION; IS/IS [id number] ordered today. This demonstrates ***.  Lab Results  Component Value Date   WBC 8.3 09/02/2018   HGB 13.3 09/02/2018   HCT 40.2 09/02/2018   PLT 310 09/02/2018   GLUCOSE 124 (H) 09/02/2018   CHOL 180 05/10/2017   TRIG 60 05/10/2017   HDL 75 05/10/2017   LDLCALC 93 05/10/2017   ALT 17 09/02/2018   AST 18 09/02/2018   NA 139 09/02/2018   K 3.4 (L) 09/02/2018   CL 102 09/02/2018   CREATININE 0.87 09/02/2018   BUN 21 09/02/2018   CO2 24 09/02/2018   TSH 0.800 02/11/2016       BNP (last 3 results) No results for input(s): BNP in the last 8760 hours.  ProBNP (last  3 results) No results for input(s): PROBNP in the last 8760 hours.   Other Studies Reviewed Today:  Echo Study Conclusions 2017  - Left ventricle: The cavity size was normal. Wall thickness was  normal. Systolic function was normal. The estimated ejection  fraction was in the range of 60% to 65%. Wall motion was normal;  there were no regional wall motion abnormalities. Doppler  parameters are consistent with abnormal left ventricular  relaxation (grade 1 diastolic dysfunction).    ASSESSMENT AND PLAN:  1. Palpitations  2. Prior smoking  3. RA  4. HTN  5. HLD -    1. Tachycardia/Palpitations benign likely related to stress, anxiety and asthma observe 2. Dyspnea Echo with normal EF  no murmur likely related to previous smoking and asthma  3. Chest Pain  Resolved normal stress test 04/2015 observe 4. HTN:  Continue diuretic and verapamil  5. Asthma  F/u Wert thinks symbicort causing pain in wrists  6. RA seems to be uni forming diagnosis for her dyspnea, chest pain, and arthritis continue methotrexate ? Elevated LFTls on med check blood work today 7. Cholesterol:  Will check labs today not done by primary in a long time  . COVID-19 Education: The signs and symptoms of COVID-19 were discussed with the patient and how to seek care for testing (follow up with PCP or arrange E-visit).  The importance of social distancing, staying at home, hand hygiene and wearing a mask when out in public were discussed today.  Current medicines are reviewed with the patient today.  The patient does not have concerns regarding medicines other than what has been noted above.  The following changes have been made:  See above.  Labs/ tests ordered today include:   No orders of the defined types were placed in this encounter.    Disposition:   FU with *** in {gen number [id number] {Days to years:10300}.   Patient is agreeable to this plan and will call if any problems develop in the interim.   SignedTruitt Merle, NP  05/07/2019 10:51 AM  [facility] [address] [place], [place]  [address] Phone: [phone] Fax: [phone]

## 2019-05-08 ENCOUNTER — Emergency Department (HOSPITAL_COMMUNITY)
Admission: EM | Admit: 2019-05-08 | Discharge: 2019-05-08 | Disposition: A | Discharge status: Home / Self Care | Payer: Medicare Other | Attending: Emergency Medicine | Admitting: Emergency Medicine

## 2019-05-08 ENCOUNTER — Emergency Department (HOSPITAL_COMMUNITY): Payer: Medicare Other

## 2019-05-08 ENCOUNTER — Ambulatory Visit: Payer: Medicare Other | Admitting: Nurse Practitioner

## 2019-05-08 ENCOUNTER — Other Ambulatory Visit: Payer: Self-pay

## 2019-05-08 ENCOUNTER — Encounter (HOSPITAL_COMMUNITY): Payer: Self-pay | Admitting: Emergency Medicine

## 2019-05-08 DIAGNOSIS — Z87891 Personal history of nicotine dependence: Secondary | ICD-10-CM | POA: Insufficient documentation

## 2019-05-08 DIAGNOSIS — Z79899 Other long term (current) drug therapy: Secondary | ICD-10-CM | POA: Diagnosis not present

## 2019-05-08 DIAGNOSIS — J40 Bronchitis, not specified as acute or chronic: Secondary | ICD-10-CM | POA: Diagnosis not present

## 2019-05-08 DIAGNOSIS — I10 Essential (primary) hypertension: Secondary | ICD-10-CM | POA: Diagnosis not present

## 2019-05-08 DIAGNOSIS — I491 Atrial premature depolarization: Secondary | ICD-10-CM | POA: Diagnosis not present

## 2019-05-08 DIAGNOSIS — Z20822 Contact with and (suspected) exposure to covid-19: Secondary | ICD-10-CM | POA: Insufficient documentation

## 2019-05-08 DIAGNOSIS — R0602 Shortness of breath: Secondary | ICD-10-CM | POA: Diagnosis not present

## 2019-05-08 DIAGNOSIS — E039 Hypothyroidism, unspecified: Secondary | ICD-10-CM | POA: Diagnosis not present

## 2019-05-08 DIAGNOSIS — M069 Rheumatoid arthritis, unspecified: Secondary | ICD-10-CM | POA: Insufficient documentation

## 2019-05-08 DIAGNOSIS — R05 Cough: Secondary | ICD-10-CM | POA: Diagnosis not present

## 2019-05-08 DIAGNOSIS — Z209 Contact with and (suspected) exposure to unspecified communicable disease: Secondary | ICD-10-CM | POA: Diagnosis not present

## 2019-05-08 DIAGNOSIS — R069 Unspecified abnormalities of breathing: Secondary | ICD-10-CM | POA: Diagnosis not present

## 2019-05-08 LAB — CBC WITH DIFFERENTIAL/PLATELET
Abs Immature Granulocytes: 0.03 10*3/uL (ref 0.00–0.07)
Basophils Absolute: 0.1 10*3/uL (ref 0.0–0.1)
Basophils Relative: 1 %
Eosinophils Absolute: 0.5 10*3/uL (ref 0.0–0.5)
Eosinophils Relative: 6 %
HCT: 45.1 % (ref 36.0–46.0)
Hemoglobin: 14.7 g/dL (ref 12.0–15.0)
Immature Granulocytes: 0 %
Lymphocytes Relative: 16 %
Lymphs Abs: 1.3 10*3/uL (ref 0.7–4.0)
MCH: 29.7 pg (ref 26.0–34.0)
MCHC: 32.6 g/dL (ref 30.0–36.0)
MCV: 91.1 fL (ref 80.0–100.0)
Monocytes Absolute: 1 10*3/uL (ref 0.1–1.0)
Monocytes Relative: 13 %
Neutro Abs: 5 10*3/uL (ref 1.7–7.7)
Neutrophils Relative %: 64 %
Platelets: 330 10*3/uL (ref 150–400)
RBC: 4.95 MIL/uL (ref 3.87–5.11)
RDW: 12.5 % (ref 11.5–15.5)
WBC: 8 10*3/uL (ref 4.0–10.5)
nRBC: 0 % (ref 0.0–0.2)

## 2019-05-08 LAB — TROPONIN I (HIGH SENSITIVITY)
Troponin I (High Sensitivity): <2 ng/L (ref <18)
Troponin I (High Sensitivity): 3 ng/L (ref <18)

## 2019-05-08 LAB — COMPREHENSIVE METABOLIC PANEL
ALT: 20 U/L (ref 0–44)
AST: 17 U/L (ref 15–41)
Albumin: 4 g/dL (ref 3.5–5.0)
Alkaline Phosphatase: 93 U/L (ref 38–126)
Anion gap: 15 (ref 5–15)
BUN: 18 mg/dL (ref 8–23)
CO2: 24 mmol/L (ref 22–32)
Calcium: 9.3 mg/dL (ref 8.9–10.3)
Chloride: 102 mmol/L (ref 98–111)
Creatinine, Ser: 0.77 mg/dL (ref 0.44–1.00)
GFR calc Af Amer: >60 mL/min (ref >60)
GFR calc non Af Amer: >60 mL/min (ref >60)
Glucose, Bld: 109 mg/dL — ABNORMAL HIGH (ref 70–99)
Potassium: 3.3 mmol/L — ABNORMAL LOW (ref 3.5–5.1)
Sodium: 141 mmol/L (ref 135–145)
Total Bilirubin: 0.7 mg/dL (ref 0.3–1.2)
Total Protein: 7.2 g/dL (ref 6.5–8.1)

## 2019-05-08 LAB — RESPIRATORY PANEL BY RT PCR (FLU A&B, COVID)
Influenza A by PCR: NEGATIVE
Influenza B by PCR: NEGATIVE
SARS Coronavirus 2 by RT PCR: NEGATIVE

## 2019-05-08 LAB — URINALYSIS, ROUTINE W REFLEX MICROSCOPIC
Bilirubin Urine: NEGATIVE
Glucose, UA: NEGATIVE mg/dL
Hgb urine dipstick: NEGATIVE
Ketones, ur: NEGATIVE mg/dL
Leukocytes,Ua: NEGATIVE
Nitrite: NEGATIVE
Protein, ur: NEGATIVE mg/dL
Specific Gravity, Urine: 1.004 — ABNORMAL LOW (ref 1.005–1.030)
pH: 8 (ref 5.0–8.0)

## 2019-05-08 LAB — TSH: TSH: 1.735 u[IU]/mL (ref 0.350–4.500)

## 2019-05-08 LAB — D-DIMER, QUANTITATIVE: D-Dimer, Quant: 0.43 ug/mL-FEU (ref 0.00–0.50)

## 2019-05-08 MED ORDER — AEROCHAMBER PLUS FLO-VU LARGE MISC
Status: AC
  Administered 2019-05-08: 1
  Filled 2019-05-08: qty 1

## 2019-05-08 MED ORDER — AZITHROMYCIN 250 MG PO TABS: ORAL_TABLET | ORAL | 0 refills | Status: DC

## 2019-05-08 MED ORDER — PREDNISONE 10 MG (21) PO TBPK: ORAL_TABLET | ORAL | 0 refills | Status: DC

## 2019-05-08 MED ORDER — ACETAMINOPHEN 325 MG PO TABS
650.0000 mg | ORAL_TABLET | Freq: Once | ORAL | Status: AC
  Administered 2019-05-08: 14:00:00 650 mg via ORAL
  Filled 2019-05-08: qty 2

## 2019-05-08 MED ORDER — ALBUTEROL SULFATE HFA 108 (90 BASE) MCG/ACT IN AERS
6.0000 | INHALATION_SPRAY | Freq: Once | RESPIRATORY_TRACT | Status: AC
  Administered 2019-05-08: 6 via RESPIRATORY_TRACT
  Filled 2019-05-08: qty 6.7

## 2019-05-08 MED ORDER — SODIUM CHLORIDE 0.9 % IV BOLUS
1000.0000 mL | Freq: Once | INTRAVENOUS | Status: AC
  Administered 2019-05-08: 14:00:00 1000 mL via INTRAVENOUS

## 2019-05-08 MED ORDER — IPRATROPIUM BROMIDE HFA 17 MCG/ACT IN AERS
2.0000 | INHALATION_SPRAY | Freq: Once | RESPIRATORY_TRACT | Status: AC
  Administered 2019-05-08: 2 via RESPIRATORY_TRACT
  Filled 2019-05-08: qty 12.9

## 2019-05-08 NOTE — ED Triage Notes (Signed)
To ED via [facility] from home-- has had increasing shortness of breath since last week-- received 2nd vaccine on Monday-- shortness of breath became worse per pt. Did receive a DepoMedrol shot from primary care MD last week for same-- has productive cough for yellow-green sputum--  Dyspnea with talking and any exertion.

## 2019-05-08 NOTE — ED Provider Notes (Signed)
[facility] EMERGENCY DEPARTMENT Provider Note   CSN: [id number] Arrival date & time: 05/08/19  [id number]     History Chief Complaint  Patient presents with  . Shortness of Breath    Brandy Townsend is a 69 y.o. female.  HPI      Brandy Townsend is a 69 y.o. female, with a history of HTN, hypothyroidism, presenting to the ED with shortness of breath for at least the last week. She states she has had a cough productive of yellow sputum for the last several weeks, which she tends to get every year.   She has been experiencing some chest discomfort that she describes as a pounding in the chest, moderate, central chest, nonradiating.  She was treated with steroids and antibiotics in December.  When she began to have worsening shortness of breath last week, she was treated with steroid injection at her PCP office around February 1.  Symptoms improved, but then began to again worsen. She obtained the first dose of her Covid vaccine 2 days ago.  Yesterday, her breathing significantly worsened, and this morning her oxygen saturation at home was noted to be 88% on room air.  She also notes difficulty with controlling her pressure over the last several days.  She has noted readings of 184/111.  This has made her feel generally unwell, fatigued, and with intermittent dizziness.  She scheduled an appointment with her cardiologist office this morning for 11 AM.   Denies fever/chills, abdominal pain, N/V/D, urinary symptoms, syncope, neurologic deficits, lower extremity edema/pain, or any other complaints.   Past Medical History:  Diagnosis Date  . Graves disease   . Hypertension   . Osteoporosis   . Rheumatoid arthritis ([place])   . Thyroid disease   . Vertigo     Patient Active Problem List   Diagnosis Date Noted  . Rheumatoid arthritis ([place]) 01/14/2017  . Pain in both hands 05/12/2016  . Chronic left shoulder pain 05/12/2016  . Chronic sinusitis of both maxillary sinuses  03/24/2016  . 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 Chest pain syndrome 05/16/2015  . HTN (hypertension) 05/16/2015  . Hyperthyroidism 05/16/2015  . Prerenal azotemia 05/16/2015    History reviewed. No pertinent surgical history.   OB History   No obstetric history on file.     No family history on file.  Social History   Tobacco Use  . Smoking status: Former Smoker    Quit date: 03/28/2009    Years since quitting: 10.1  . Smokeless tobacco: Never Used  Substance Use Topics  . Alcohol use: No  . Drug use: No    Home Medications Prior to Admission medications   Medication Sig Start Date End Date Taking? Authorizing Provider  acetaminophen (TYLENOL) 325 MG tablet Take 325 mg by mouth every 6 (six) hours as needed for moderate pain.   Yes [provider]  albuterol (VENTOLIN HFA) 108 (90 Base) MCG/ACT inhaler Inhale 1 puff into the lungs every 4 (four) hours as needed for wheezing or shortness of breath. 04/02/19  Yes [provider]  benzonatate (TESSALON) 100 MG capsule Take 100 mg by mouth 3 (three) times daily as needed. 03/11/19  Yes [provider]  cetirizine (ZYRTEC) 10 MG tablet Take 10 mg by mouth at bedtime as needed for allergies.    Yes [provider]  cholecalciferol (VITAMIN D3) 25 MCG (1000 UNIT) tablet Take 1,000 Units by mouth daily.   Yes [provider]  ibuprofen (ADVIL) 200 MG tablet Take 200 mg by mouth every 6 (six) hours as needed for moderate pain.   Yes [provider]  KLOR-CON 10 10 MEQ tablet Take 10 mEq by mouth daily. 08/11/14  Yes [provider]  leflunomide (ARAVA) 20 MG tablet Take 20 mg by mouth daily. 05/02/19  Yes [provider]  levothyroxine (SYNTHROID) 50 MCG tablet Take 1 tablet by mouth every morning. On an empty stomach every day except sunday 08/16/18  Yes [provider]  torsemide (DEMADEX) 20 MG tablet Take 10-20 mg by mouth See  admin instructions. Taking one tablet (20mg ) in the AM, and 1/2 tab (10 mg) at night 04/26/16  Yes [provider]  verapamil (CALAN-SR) 120 MG CR tablet Take 120 mg by mouth daily. 08/20/18  Yes [provider]  azithromycin (ZITHROMAX) 250 MG tablet Take first 2 tablets together, then 1 every day until finished. 05/08/19   Ryane Canavan C, PA-C  meclizine (ANTIVERT) 25 MG tablet Take 1 tablet (25 mg total) 3 (three) times daily as needed by mouth for dizziness. Patient not taking: Reported on 09/02/2018 02/02/17   Waynetta Pean, PA-C  ondansetron (ZOFRAN ODT) 4 MG disintegrating tablet Take 1 tablet (4 mg total) every 8 (eight) hours as needed by mouth for nausea or vomiting. Patient not taking: Reported on 09/02/2018 02/02/17   Waynetta Pean, PA-C  predniSONE (STERAPRED UNI-PAK 21 TAB) 10 MG (21) TBPK tablet Take 6 tabs (60mg ) day 1, 5 tabs (50mg ) day 2, 4 tabs (40mg ) day 3, 3 tabs (30mg ) day 4, 2 tabs (20mg ) day 5, and 1 tab (10mg ) day 6. 05/08/19   Mordecai Tindol C, PA-C    Allergies    Macrobid [nitrofurantoin monohyd macro]  Review of Systems   Review of Systems  Constitutional: Negative for chills, diaphoresis and fever.  Respiratory: Positive for cough and shortness of breath.   Cardiovascular: Positive for chest pain. Negative for leg swelling.  Gastrointestinal: Negative for abdominal pain, diarrhea, nausea and vomiting.  Genitourinary: Negative for dysuria, flank pain, frequency and hematuria.  Musculoskeletal: Negative for back pain.  Neurological: Positive for dizziness. Negative for syncope, weakness, numbness and headaches.  All other systems reviewed and are negative.   Physical Exam Updated Vital Signs BP (!) 131/97 (BP Location: Right Arm)   Pulse (!) 105   Temp (!) 97.2 F (36.2 C) (Oral)   Resp (!) 24   SpO2 93%   Physical Exam Vitals and nursing note reviewed.  Constitutional:      General: She is not in acute distress.    Appearance: She is  well-developed. She is not diaphoretic.  HENT:     Head: Normocephalic and atraumatic.     Mouth/Throat:     Mouth: Mucous membranes are moist.     Pharynx: Oropharynx is clear.  Eyes:     Conjunctiva/sclera: Conjunctivae normal.  Cardiovascular:     Rate and Rhythm: Normal rate and regular rhythm.     Pulses: Normal pulses.          Radial pulses are 2+ on the right side and 2+ on the left side.       Posterior tibial pulses are 2+ on the right side and 2+ on the left side.     Heart sounds: Normal heart sounds.     Comments: Tactile temperature in the extremities appropriate and equal bilaterally. Borderline tachycardia. Pulmonary:     Effort: Tachypnea present.  Breath sounds: Normal breath sounds.     Comments: Increased work of breathing with conversational dyspnea. SPO2 noted down to 91% on room air, 94% on 2 L/min supplemental O2. Abdominal:     Palpations: Abdomen is soft.     Tenderness: There is no abdominal tenderness. There is no guarding.  Musculoskeletal:     Cervical back: Neck supple.     Right lower leg: No edema.     Left lower leg: No edema.  Lymphadenopathy:     Cervical: No cervical adenopathy.  Skin:    General: Skin is warm and dry.  Neurological:     Mental Status: She is alert.  Psychiatric:        Mood and Affect: Mood and affect normal.        Speech: Speech normal.        Behavior: Behavior normal.     ED Results / Procedures / Treatments   Labs (all labs ordered are listed, but only abnormal results are displayed) Labs Reviewed  COMPREHENSIVE METABOLIC PANEL - Abnormal; Notable for the following components:      Result Value   Potassium 3.3 (*)    Glucose, Bld 109 (*)    All other components within normal limits  URINALYSIS, ROUTINE W REFLEX MICROSCOPIC - Abnormal; Notable for the following components:   Color, Urine COLORLESS (*)    Specific Gravity, Urine 1.004 (*)    All other components within normal limits  RESPIRATORY PANEL  BY RT PCR (FLU A&B, COVID)  CBC WITH DIFFERENTIAL/PLATELET  D-DIMER, QUANTITATIVE (NOT AT [facility])  TSH  POC SARS CORONAVIRUS 2 AG -  ED  TROPONIN I (HIGH SENSITIVITY)  TROPONIN I (HIGH SENSITIVITY)    EKG None  Radiology DG Chest Port 1 View  Result Date: 05/08/2019 CLINICAL DATA:  Productive cough for 3 weeks, shortness of breath, hypertension, received second COVID vaccine dose 2 days ago EXAM: PORTABLE CHEST 1 VIEW COMPARISON:  Portable exam 1044 hours compared to 01/16/2019 FINDINGS: Normal heart size, mediastinal contours, and pulmonary vascularity. Lungs clear. No pulmonary infiltrate, pleural effusion or pneumothorax. Minimal chronic central peribronchial thickening. Bones demineralized with scattered degenerative disc disease changes thoracic spine. IMPRESSION: Minimal chronic bronchitic changes without infiltrate. Electronically Signed   By: Lavonia Dana M.D.   On: 05/08/2019 11:17    Procedures Procedures (including critical care time)  Medications Ordered in ED Medications  albuterol (VENTOLIN HFA) 108 (90 Base) MCG/ACT inhaler 6 puff (6 puffs Inhalation Given 05/08/19 1154)  ipratropium (ATROVENT HFA) inhaler 2 puff (2 puffs Inhalation Given 05/08/19 1156)  AeroChamber Plus Flo-Vu Large MISC (1 each  Given 05/08/19 1156)  sodium chloride 0.9 % bolus 1,000 mL (0 mLs Intravenous Stopped 05/08/19 1537)  acetaminophen (TYLENOL) tablet 650 mg (650 mg Oral Given 05/08/19 1424)    ED Course  I have reviewed the triage vital signs and the nursing notes.  Pertinent labs & imaging results that were available during my care of the patient were reviewed by me and considered in my medical decision making (see chart for details).  Clinical Course as of May 07 1645  Wed May 08, 2019  1156 RN states she accidentally collected swab used for 2 hour COVID test rather than POC. We will switch the order.   [SJ]  [id number] Patient is already taking a potassium supplement.   Potassium(!): 3.3 [SJ]    1310 Patient states she feels better. Complains of mild headache.   [SJ]    Clinical Course User  Index [SJ] Brieonna Crutcher, Helane Gunther, PA-C   MDM Rules/Calculators/A&P                      Patient presents with shortness of breath and cough. Patient is nontoxic appearing, afebrile, not tachycardic on my exam, not hypotensive.  No leukocytosis.  Initially with increased work of breathing and SPO2 down to 91% on room air. After albuterol, Atrovent, and hydration, patient improved significantly.  She was no longer tachypneic or conversationally dyspneic.  She was no longer requiring oxygen for comfort and she was able to ambulate while maintaining 92-93% SPO2 on room air and without shortness of breath or chest discomfort. Bronchitis changes on chest x-ray may certainly be chronic, however, due to the recurrent nature of the patient's complaint, we will try a course of antibiotics. The patient was given instructions for home care as well as return precautions. Patient voices understanding of these instructions, accepts the plan, and is comfortable with discharge.   Findings and plan of care discussed with Delphina Cahill, MD.   Vitals:   05/08/19 1200 05/08/19 1230 05/08/19 1245 05/08/19 1315  BP: 123/71 121/83 113/71 117/89  Pulse: 87 78 76 72  Resp: 14 (!) 27 19 16   Temp:      TempSrc:      SpO2: 94% 96% 94% 93%  Weight:      Height:        Final Clinical Impression(s) / ED Diagnoses Final diagnoses:  Shortness of breath  Bronchitis    Rx / DC Orders ED Discharge Orders         Ordered    predniSONE (STERAPRED UNI-PAK 21 TAB) 10 MG (21) TBPK tablet     05/08/19 1535    azithromycin (ZITHROMAX) 250 MG tablet     05/08/19 [address], PA-C 05/08/19 1647    Davonna Belling, MD 05/09/19 [id number]

## 2019-05-08 NOTE — Discharge Instructions (Addendum)
General Viral Syndrome Care Instructions:  Your symptoms are generally consistent with a viral illness, however, due to duration and persistence of symptoms, we will try a trial of antibiotics.  Treatment is otherwise symptomatic care and it is important to note that these symptoms may last for 7-14 days.   Hand washing: Wash your hands throughout the day, but especially before and after touching the face, using the restroom, sneezing, coughing, or touching surfaces that have been coughed or sneezed upon. Hydration: Symptoms of most illnesses will be intensified and complicated by dehydration. Dehydration can also extend the duration of symptoms. Drink plenty of fluids and get plenty of rest. You should be drinking at least half a liter of water an hour to stay hydrated. Electrolyte drinks (ex. Gatorade, Powerade, Pedialyte) are also encouraged. You should be drinking enough fluids to make your urine light yellow, almost clear. If this is not the case, you are not drinking enough water. Please note that some of the treatments indicated below will not be effective if you are not adequately hydrated. Pain or fever: Ibuprofen, Naproxen, or acetaminophen (generic for Tylenol) for pain or fever.  Acetaminophen: May take acetaminophen (generic for Tylenol), as needed, for pain. Your daily total maximum amount of acetaminophen from all sources should be limited to 4000mg /day for persons without liver problems, or 2000mg /day for those with liver problems. Cough: Use the benzonatate (generic for Tessalon) for cough.  Teas, warm liquids, broths, and honey can also help with cough. Albuterol: May use the albuterol as needed for instances of shortness of breath.  You may use this medication 1 to 2 puffs every 6 hours. Ipratropium: Ipratropium (generic for Atrovent) can be used 2 puffs every 6 hours, as needed, with the albuterol for shortness of breath. Prednisone: Take the prednisone, as directed, in its  entirety. Zyrtec or Claritin: May add these medication daily to control underlying symptoms of congestion, sneezing, and other signs of allergies.  These medications are available over-the-counter. Generics: Cetirizine (generic for Zyrtec) and loratadine (generic for Claritin). Fluticasone: Use fluticasone (generic for Flonase), as directed, for nasal and sinus congestion.  This medication is available over-the-counter. Congestion: Plain guaifenesin (generic for plain Mucinex) may help relieve congestion. Saline sinus rinses and saline nasal sprays may also help relieve congestion.  Sore throat: Warm liquids or Chloraseptic spray may help soothe a sore throat. Gargle twice a day with a salt water solution made from a half teaspoon of salt in a cup of warm water.  Follow up: Follow up with a primary care provider within the next two weeks should symptoms fail to resolve. Return: Return to the ED for significantly worsening symptoms, shortness of breath, persistent vomiting, large amounts of blood in stool, or any other major concerns.  For prescription assistance, may try using prescription discount sites or apps, such as goodrx.com

## 2019-05-08 NOTE — ED Notes (Signed)
Walked patient up the hall with pulse oxy patient stayed at 92 to 93 room air patient is now back in bed on the monitor with call bell in reach

## 2019-05-10 ENCOUNTER — Telehealth: Payer: Self-pay | Admitting: Cardiovascular Disease

## 2019-05-10 NOTE — Telephone Encounter (Signed)
I spoke to the patient who called because she recently went to the ED 2/10 with "chest tightness" and SOB.  Her BP and HR were elevated 120-130 bpm and 167/101 respectively.  She had received her second CoVid vaccine last Monday 2/8.     I scheduled an appointment with Cecille Rubin on 2/15 to further evaluate, but informed her that if symptoms worsen she should return to the ED.  In the meantime, she wants to try an extra Verapamil 120 mg and see if things improve.

## 2019-05-10 NOTE — Progress Notes (Signed)
CARDIOLOGY OFFICE NOTE  Date:  05/13/2019    Brandy Townsend Date of Birth: 08/06/1950 Medical Record [id number]  PCP:  Jani Gravel, MD  Cardiologist:  Johnsie Cancel   Chief Complaint  Patient presents with  . Follow-up    Post ER visit - seen for Dr. Johnsie Cancel    History of Present Illness: Brandy Townsend is a 69 y.o. female who presents today for a work in/post ER visit. Seen for Dr. Johnsie Cancel.   She has a history of palpitations, atypical chest pain with prior normal Myoview, asthma, former smoker and prior thyroid ablation.   Last seen by Dr. Johnsie Cancel in February of 2019.   She was to have seen me last week - but ended up in the ER with shortness of breath/cough. She has had both COVID vaccines. She had seen PCP and had had steroids and antibiotics back in December. Also noting more difficulty in getting her BP controlled. She was treated in the ER last week with albuterol, Atrovent and hydration with improvement. Sent home on antibiotics as well.   The patient does not have symptoms concerning for COVID-19 infection (fever, chills, cough, or new shortness of breath).   Comes in today. Here alone. She notes that she was in the ER last week - had had worsening shortness of breath with elevated HR and BP - she was not given anything there last week for her BP but fluids and breathing treatment. She is on a steroid taper - finishes this tomorrow. She slowly felt better but called back here last Friday - wanted to be seen - took it upon herself to increase her Verapamil. Lots of stress - husband has esophageal cancer - has feeding tube. 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 She notes constant chest pressure - feels like she has to pull at her bra. She is not sleeping. She says the ER mentioned going to see pulmonary - but then said to come here - she has seen Dr. Melvyn Novas in the past.   Past Medical History:  Diagnosis Date  . Graves disease   . Hypertension   . Osteoporosis   . Rheumatoid  arthritis ([place])   . Thyroid disease   . Vertigo     History reviewed. No pertinent surgical history.   Medications: Current Meds  Medication Sig  . acetaminophen (TYLENOL) 325 MG tablet Take 325 mg by mouth every 6 (six) hours as needed for moderate pain.  Marland Kitchen albuterol (VENTOLIN HFA) 108 (90 Base) MCG/ACT inhaler Inhale 1 puff into the lungs every 4 (four) hours as needed for wheezing or shortness of breath.  . Albuterol Sulfate (PROVENTIL HFA IN) Inhale 1 puff into the lungs daily.  . cetirizine (ZYRTEC) 10 MG tablet Take 10 mg by mouth at bedtime as needed for allergies.   . cholecalciferol (VITAMIN D3) 25 MCG (1000 UNIT) tablet Take 1,000 Units by mouth daily.  Marland Kitchen ibuprofen (ADVIL) 200 MG tablet Take 200 mg by mouth every 6 (six) hours as needed for moderate pain.  Marland Kitchen ipratropium (ATROVENT HFA) 17 MCG/ACT inhaler Inhale 1 puff into the lungs every 6 (six) hours.  Marland Kitchen KLOR-CON 10 10 MEQ tablet Take 10 mEq by mouth daily.  Marland Kitchen leflunomide (ARAVA) 20 MG tablet Take 20 mg by mouth daily.  Marland Kitchen levothyroxine (SYNTHROID) 50 MCG tablet Take 1 tablet by mouth every morning. On an empty stomach every day except sunday  . predniSONE (STERAPRED UNI-PAK 21 TAB) 10 MG (21) TBPK tablet  Take 6 tabs (60mg ) day 1, 5 tabs (50mg ) day 2, 4 tabs (40mg ) day 3, 3 tabs (30mg ) day 4, 2 tabs (20mg ) day 5, and 1 tab (10mg ) day 6.  . torsemide (DEMADEX) 20 MG tablet Take 10-20 mg by mouth See admin instructions. Taking one tablet (20mg ) in the AM, and 1/2 tab (10 mg) at night  . verapamil (CALAN-SR) 240 MG CR tablet Take 1 tablet (240 mg total) by mouth daily.  . [DISCONTINUED] verapamil (CALAN-SR) 240 MG CR tablet Take 240 mg by mouth at bedtime.     Allergies: Allergies  Allergen Reactions  . Macrobid [employer] Macro] Other (See Comments)    Lowered potassium, caused aches and pains    Social History: The patient  reports that she quit smoking about 10 years ago. She has never used smokeless  tobacco. She reports that she does not drink alcohol or use drugs.   Family History: The patient's family history is not on file. Father died at 67 with heart disease - had had prior MIs and had prior CABG. Mother died age 47 with natural causes.   Review of Systems: Please see the history of present illness.   All other systems are reviewed and negative.   Physical Exam: VS:  BP 138/78   Pulse 80   Ht 5' 3.5" (1.613 m)   Wt 144 lb (65.3 kg)   SpO2 95%   BMI 25.11 kg/m  .  BMI Body mass index is 25.11 kg/m.  Wt Readings from Last 3 Encounters:  05/13/19 144 lb (65.3 kg)  05/08/19 143 lb (64.9 kg)  05/10/17 156 lb (70.8 kg)    General: Pleasant. She is anxious. She is a little tearful at times. She is in no acute distress.   Cardiac: Regular rate and rhythm. No murmurs, rubs, or gallops. No edema.  Respiratory:  Lungs are clear to auscultation bilaterally with normal work of breathing.  Skin: Warm and dry. Color is normal.  Neuro:  Strength and sensation are intact and no gross focal deficits noted.  Psych: Alert, appropriate and with normal affect.   LABORATORY DATA:  EKG:  EKG is ordered today. This shows sinus brady - HR is 57- less lateral ST/T wave changes when compared to prior tracing.   Lab Results  Component Value Date   WBC 8.0 05/08/2019   HGB 14.7 05/08/2019   HCT 45.1 05/08/2019   PLT 330 05/08/2019   GLUCOSE 109 (H) 05/08/2019   CHOL 180 05/10/2017   TRIG 60 05/10/2017   HDL 75 05/10/2017   LDLCALC 93 05/10/2017   ALT 20 05/08/2019   AST 17 05/08/2019   NA 141 05/08/2019   K 3.3 (L) 05/08/2019   CL 102 05/08/2019   CREATININE 0.77 05/08/2019   BUN 18 05/08/2019   CO2 24 05/08/2019   TSH 1.735 05/08/2019       BNP (last 3 results) No results for input(s): BNP in the last 8760 hours.  ProBNP (last 3 results) No results for input(s): PROBNP in the last 8760 hours.  Other Studies Reviewed Today:  Echo Study Conclusions  2017  - Left  ventricle: The cavity size was normal. Wall thickness was  normal. Systolic function was normal. The estimated ejection  fraction was in the range of 60% to 65%. Wall motion was normal;  there were no regional wall motion abnormalities. Doppler  parameters are consistent with abnormal left ventricular  relaxation (grade 1 diastolic dysfunction).   MYOVIEW IMPRESSION 2017:  1. No reversible ischemia or infarction.  2. Normal left ventricular wall motion.  3. Left ventricular ejection fraction 72%  4. Low-risk stress test findings*.  *2012 Appropriate Use Criteria for Coronary Revascularization Focused Update: J Am Coll Cardiol. [id number]. http://content.airportbarriers.com.aspx?articleid=1201161   Electronically Signed   By: Marybelle Killings M.D.  Assessment/Plan:  1. Elevated BP/palpitations/chest pain - will arrange for repeat stress testing and Echocardiogram. I think the steroids are playing a role. Her second COVID vaccine may also have played a role. The care giver role is certainly an issue as well. Will leave her on the higher dose of Verapamil - BP and HR better today - this was refilled for her today. See back after studies are complete. EKG looks better here today which is reassuring.   2. HTN - see above. She is to monitor her BP for me.    3. HLD - does not wish to be on therapy.   4. Former smoker. Has had asthma/bronchitis/sinusitis - Has seen Dr. Melvyn Novas in the past.   5. RA  6. + FH for early CAD - see #1 - updating her stress test.   7. COVID-19 Education: The signs and symptoms of COVID-19 were discussed with the patient and how to seek care for testing (follow up with PCP or arrange E-visit).  The importance of social distancing, staying at home, hand hygiene and wearing a mask when out in public were discussed today.  Current medicines are reviewed with the patient today.  The patient does not have concerns regarding medicines other  than what has been noted above.  The following changes have been made:  See above.  Labs/ tests ordered today include:    Orders Placed This Encounter  Procedures  . Basic metabolic panel  . MYOCARDIAL PERFUSION IMAGING  . EKG 12-Lead  . ECHOCARDIOGRAM COMPLETE     Disposition:   FU with me after her studies are complete - we could do a virtual visit if that would be easier on her.   Patient is agreeable to this plan and will call if any problems develop in the interim.   SignedTruitt Merle, NP  05/13/2019 4:42 PM  [facility] [address] [place], [place]  [address] Phone: [phone] Fax: [phone]

## 2019-05-10 NOTE — Telephone Encounter (Signed)
STAT if HR is under 50 or over 120 (normal HR is 60-100 beats per minute)  1) What is your heart rate? Going up as highg 125-  Today it is 102  2) Do you have a log of your heart rate readings (document readings)? no  3) Do you have any other symptoms? Tightness in chest and short of breath

## 2019-05-13 ENCOUNTER — Other Ambulatory Visit: Payer: Self-pay

## 2019-05-13 ENCOUNTER — Ambulatory Visit: Payer: Medicare Other | Admitting: Nurse Practitioner

## 2019-05-13 ENCOUNTER — Telehealth: Payer: Self-pay | Admitting: *Deleted

## 2019-05-13 ENCOUNTER — Encounter: Payer: Self-pay | Admitting: Nurse Practitioner

## 2019-05-13 VITALS — BP 138/78 | HR 80 | Ht 63.5 in | Wt 144.0 lb

## 2019-05-13 DIAGNOSIS — Z7189 Other specified counseling: Secondary | ICD-10-CM

## 2019-05-13 DIAGNOSIS — R002 Palpitations: Secondary | ICD-10-CM

## 2019-05-13 DIAGNOSIS — I259 Chronic ischemic heart disease, unspecified: Secondary | ICD-10-CM

## 2019-05-13 DIAGNOSIS — I10 Essential (primary) hypertension: Secondary | ICD-10-CM

## 2019-05-13 DIAGNOSIS — E782 Mixed hyperlipidemia: Secondary | ICD-10-CM | POA: Diagnosis not present

## 2019-05-13 MED ORDER — VERAPAMIL HCL ER 240 MG PO TBCR: 240.0000 mg | EXTENDED_RELEASE_TABLET | Freq: Every day | ORAL | 3 refills | Status: DC

## 2019-05-13 NOTE — Telephone Encounter (Signed)

## 2019-05-13 NOTE — Patient Instructions (Addendum)
After Visit Summary:  We will be checking the following labs today - BMET   Medication Instructions:    Continue with your current medicines.   I will send a RX for the higher dose of Verapamil - stay on this    If you need a refill on your cardiac medications before your next appointment, please call your pharmacy.     Testing/Procedures To Be Arranged:  Myoview  Echocardiogram  Follow-Up:   See me back in a few weeks - could do a virtual visit     At [facility], you and your health needs are our priority.  As part of our continuing mission to provide you with exceptional heart care, we have created designated Provider Care Teams.  These Care Teams include your primary Cardiologist (physician) and Advanced Practice Providers (APPs -  Physician Assistants and Nurse Practitioners) who all work together to provide you with the care you need, when you need it.  Special Instructions:  . Stay safe, stay home, wash your hands for at least 20 seconds and wear a mask when out in public.  . It was good to talk with you today.  . Try to keep a check on your BP for me.    You are scheduled for a Myocardial Perfusion Imaging Study on ________ at ______.   Please arrive 15 minutes prior to your appointment time for registration and insurance purposes.   The test will take approximately 3 to 4 hours to complete; you may bring reading material. If someone comes with you to your appointment, they will need to remain in the main lobby due to limited space in the testing area.   How to prepare for your Myocardial Perfusion test:   Do not eat or drink 3 hours prior to your test, except you may have water.    Do not consume products containing caffeine (regular or decaffeinated) 12 hours prior to your test (ex: coffee, chocolate, soda, tea)   Do bring a list of your current medications with you. If not listed below, you may take your medications as normal.   Bring any held  medication to your appointment, as you may be required to take it once the test is complete.   Do wear comfortable clothes (no dresses or overalls) and walking shoes. Tennis shoes are preferred. No heels or open toed shoes.  Do not wear cologne, perfume, aftershave or lotions (deodorant is allowed).   If these instructions are not followed, you test will have to be rescheduled.   Please report to [address] for your test. If you have questions or concerns about your appointment, please call the Nuclear Lab at [phone].  If you cannot keep your appointment, please provide 24 hour notification to the Nuclear lab to avoid a possible $50 charge to your account.       Call the [place] office at [phone] if you have any questions, problems or concerns.

## 2019-05-14 ENCOUNTER — Ambulatory Visit (HOSPITAL_COMMUNITY): Payer: Medicare Other | Attending: Cardiology

## 2019-05-14 ENCOUNTER — Ambulatory Visit (HOSPITAL_BASED_OUTPATIENT_CLINIC_OR_DEPARTMENT_OTHER): Payer: Medicare Other

## 2019-05-14 DIAGNOSIS — I259 Chronic ischemic heart disease, unspecified: Secondary | ICD-10-CM | POA: Insufficient documentation

## 2019-05-14 LAB — MYOCARDIAL PERFUSION IMAGING
LV dias vol: 43 mL (ref 46–106)
LV sys vol: 10 mL
Peak HR: 108 {beats}/min
Rest HR: 67 {beats}/min
SDS: 0
SRS: 0
SSS: 0
TID: 1.1

## 2019-05-14 LAB — BASIC METABOLIC PANEL
BUN/Creatinine Ratio: 28 (ref 12–28)
BUN: 26 mg/dL (ref 8–27)
CO2: 26 mmol/L (ref 20–29)
Calcium: 9.9 mg/dL (ref 8.7–10.3)
Chloride: 97 mmol/L (ref 96–106)
Creatinine, Ser: 0.93 mg/dL (ref 0.57–1.00)
GFR calc Af Amer: 74 mL/min/{1.73_m2} (ref >59)
GFR calc non Af Amer: 64 mL/min/{1.73_m2} (ref >59)
Glucose: 115 mg/dL — ABNORMAL HIGH (ref 65–99)
Potassium: 3.5 mmol/L (ref 3.5–5.2)
Sodium: 141 mmol/L (ref 134–144)

## 2019-05-14 LAB — ECHOCARDIOGRAM COMPLETE
Height: 63.5 in
Weight: 2304 oz

## 2019-05-14 MED ORDER — TECHNETIUM TC 99M TETROFOSMIN IV KIT
31.6000 | PACK | Freq: Once | INTRAVENOUS | Status: AC | PRN
  Administered 2019-05-14: 31.6 via INTRAVENOUS
  Filled 2019-05-14: qty 32

## 2019-05-14 MED ORDER — TECHNETIUM TC 99M TETROFOSMIN IV KIT
10.9000 | PACK | Freq: Once | INTRAVENOUS | Status: AC | PRN
  Administered 2019-05-14: 10:00:00 10.9 via INTRAVENOUS
  Filled 2019-05-14: qty 11

## 2019-05-14 MED ORDER — REGADENOSON 0.4 MG/5ML IV SOLN
0.4000 mg | Freq: Once | INTRAVENOUS | Status: AC
  Administered 2019-05-14: 12:00:00 0.4 mg via INTRAVENOUS

## 2019-05-15 ENCOUNTER — Other Ambulatory Visit: Payer: Self-pay | Admitting: *Deleted

## 2019-05-15 MED ORDER — TORSEMIDE 20 MG PO TABS: 20.0000 mg | ORAL_TABLET | Freq: Every day | ORAL | 3 refills | Status: DC

## 2019-05-21 DIAGNOSIS — Z8639 Personal history of other endocrine, nutritional and metabolic disease: Secondary | ICD-10-CM | POA: Diagnosis not present

## 2019-05-21 DIAGNOSIS — Z6825 Body mass index (BMI) 25.0-25.9, adult: Secondary | ICD-10-CM | POA: Diagnosis not present

## 2019-05-21 DIAGNOSIS — Z923 Personal history of irradiation: Secondary | ICD-10-CM | POA: Diagnosis not present

## 2019-05-21 DIAGNOSIS — E039 Hypothyroidism, unspecified: Secondary | ICD-10-CM | POA: Diagnosis not present

## 2019-05-22 NOTE — Progress Notes (Signed)
Telehealth Visit     Virtual Visit via Video Note   This visit type was conducted due to national recommendations for restrictions regarding the COVID-19 Pandemic (e.g. social distancing) in an effort to limit this patient's exposure and mitigate transmission in our community.  Due to her co-morbid illnesses, this patient is at least at moderate risk for complications without adequate follow up.  This format is felt to be most appropriate for this patient at this time.  All issues noted in this document were discussed and addressed.  A limited physical exam was performed with this format.  Please refer to the patient's chart for her consent to telehealth for [facility].   Evaluation Performed:  Follow-up visit  This visit type was conducted due to national recommendations for restrictions regarding the COVID-19 Pandemic (e.g. social distancing).  This format is felt to be most appropriate for this patient at this time.  All issues noted in this document were discussed and addressed.  No physical exam was performed (except for noted visual exam findings with Video Visits).  Please refer to the patient's chart (MyChart message for video visits and phone note for telephone visits) for the patient's consent to telehealth for [facility].  Date:  05/29/2019   ID:  Brandy Townsend, DOB 25-Feb-1951, MRN [id number]  Patient Location:  Home  Provider location:   Home  PCP:  Jani Gravel, MD  Cardiologist:  Gillian Shields Electrophysiologist:  None   Chief Complaint:  Follow up   History of Present Illness:    Brandy Townsend is a 69 y.o. female who presents via audio/video conferencing for a telehealth visit today.  Seen for a follow up visit.  Seen for Dr. Johnsie Cancel.   She has a history of palpitations, atypical chest pain with prior normal Myoview, asthma, former smoker and prior thyroid ablation.   Last seen by Dr. Johnsie Cancel in February of 2019.   I saw her just a few weeks ago - she had  been to the ER with SOB/cough. She has had both COVID vaccines. She has had issues with asthma/bronchitis since December along with trouble getting BP down. She has noted that her HR and BP were elevated - also endorsing chest pain - she had increased her CCB which I continued - echo and stress testing was arranged. Lots of stress at home - caring for her husband who has esophageal cancer. She was considering seeing pulmonary again (Dr. Melvyn Novas).   The patient does not have symptoms concerning for COVID-19 infection (fever, chills, cough, or new shortness of breath).   Seen today via Doximity video. She has consented for this visit. She is doing well. Trying to establish with Dr. Forde Dandy for primary care/endocrine. Has sent him her labs for review. She is doing much better. Has no concerns. BP is great. No chest pain. Not short of breath. No palpitations. She is happy with how she is doing and appreciative of our care.    Past Medical History:  Diagnosis Date  . Graves disease   . Hypertension   . Osteoporosis   . Rheumatoid arthritis ([place])   . Thyroid disease   . Vertigo    No past surgical history on file.   Current Meds  Medication Sig  . acetaminophen (TYLENOL) 325 MG tablet Take 325 mg by mouth every 6 (six) hours as needed for moderate pain.  Marland Kitchen albuterol (VENTOLIN HFA) 108 (90 Base) MCG/ACT inhaler Inhale 1 puff into the lungs every 4 (  four) hours as needed for wheezing or shortness of breath.  . cetirizine (ZYRTEC) 10 MG tablet Take 10 mg by mouth at bedtime as needed for allergies.   . cholecalciferol (VITAMIN D3) 25 MCG (1000 UNIT) tablet Take 1,000 Units by mouth daily.  Marland Kitchen ibuprofen (ADVIL) 200 MG tablet Take 200 mg by mouth every 6 (six) hours as needed for moderate pain.  Marland Kitchen ipratropium (ATROVENT HFA) 17 MCG/ACT inhaler Inhale 1 puff into the lungs daily.   Marland Kitchen KLOR-CON 10 10 MEQ tablet Take 10 mEq by mouth daily.  Marland Kitchen leflunomide (ARAVA) 20 MG tablet Take 20 mg by mouth daily.  Marland Kitchen  levothyroxine (SYNTHROID) 50 MCG tablet Take 1 tablet by mouth every morning. On an empty stomach every day except sunday  . torsemide (DEMADEX) 20 MG tablet Take 1 tablet (20 mg total) by mouth daily.  . verapamil (CALAN-SR) 240 MG CR tablet Take 1 tablet (240 mg total) by mouth daily.     Allergies:   Macrobid [nitrofurantoin monohyd macro]   Social History   Tobacco Use  . Smoking status: Former Smoker    Quit date: 03/28/2009    Years since quitting: 10.1  . Smokeless tobacco: Never Used  Substance Use Topics  . Alcohol use: No  . Drug use: No     Family Hx: The patient's family history is not on file.  ROS:   Please see the history of present illness.   All other systems reviewed are negative.    Objective:    Vital Signs:  BP 122/63 Comment: 97/71  Pulse 67 Comment: 68  Ht 5' 3.5" (1.613 m)   Wt 143 lb 3.2 oz (65 kg)   BMI 24.97 kg/m    Wt Readings from Last 3 Encounters:  05/29/19 143 lb 3.2 oz (65 kg)  05/14/19 144 lb (65.3 kg)  05/13/19 144 lb (65.3 kg)    Alert female in no acute distress. She looks good. Not short of breath with conversation. Appropriate.    Labs/Other Tests and Data Reviewed:    Lab Results  Component Value Date   WBC 8.0 05/08/2019   HGB 14.7 05/08/2019   HCT 45.1 05/08/2019   PLT 330 05/08/2019   GLUCOSE 115 (H) 05/13/2019   CHOL 180 05/10/2017   TRIG 60 05/10/2017   HDL 75 05/10/2017   LDLCALC 93 05/10/2017   ALT 20 05/08/2019   AST 17 05/08/2019   NA 141 05/13/2019   K 3.5 05/13/2019   CL 97 05/13/2019   CREATININE 0.93 05/13/2019   BUN 26 05/13/2019   CO2 26 05/13/2019   TSH 1.735 05/08/2019     BNP (last 3 results) No results for input(s): BNP in the last 8760 hours.  ProBNP (last 3 results) No results for input(s): PROBNP in the last 8760 hours.    Prior CV studies:    The following studies were reviewed today:  Myoview Study Highlights 04/2019   The left ventricular ejection fraction is hyperdynamic  (>65%).  Nuclear stress EF: 77%.  Horizontal ST segment depression ST segment depression of 1 mm was noted during stress in the II, III and aVF leads, and returning to baseline after 5-9 minutes of recovery.  The study is normal.  This is a low risk study.  No change from prior study.     ECHO IMPRESSIONS 04/2019  1. Left ventricular ejection fraction, by estimation, is 55 to 60%. The  left ventricle has normal function. The left ventricle has no regional  wall motion abnormalities. Left ventricular diastolic parameters were  normal.  2. Right ventricular systolic function is normal. The right ventricular  size is normal. There is normal pulmonary artery systolic pressure. The  estimated right ventricular systolic pressure is [id number] mmHg.  3. The mitral valve is grossly normal. No evidence of mitral valve  regurgitation.  4. The aortic valve is tricuspid. Aortic valve regurgitation is not  visualized. No aortic stenosis is present.  5. The inferior vena cava is normal in size with greater than 50%  respiratory variability, suggesting right atrial pressure of 3 mmHg.    ASSESSMENT & PLAN:    1.  Elevated BP and HR with associated chest pain and palpitations - I thought this was related to recent steroid therapy - as well as possible COVID vaccine - her cardiac studies are reassuring. Her symptoms have resolved. Will leave her on the higher dose of her Calan and she will continue to monitor.   2. HTN - BP is great with recent med change - will continue this for now and she will continue to monitor at home.   3. HLD - no recent lipids - but prior ones are very satisfactory.   4. Former smoker with asthma/bronchitis/sinusitis - resolved.   5. RA  6. +FH for CAD - would favor aggressive CV risk factor modification.   7. COVID-19 Education: The signs and symptoms of COVID-19 were discussed with the patient and how to seek care for testing (follow up with PCP or arrange  E-visit).  The importance of social distancing, staying at home, hand hygiene and wearing a mask when out in public were discussed today.  Patient Risk:   After full review of this patient's clinical status, I feel that they are at least moderate risk at this time.  Time:   Today, I have spent 8 minutes with the patient with telehealth technology discussing the above issues.     Medication Adjustments/Labs and Tests Ordered: Current medicines are reviewed at length with the patient today.  Concerns regarding medicines are outlined above.   Tests Ordered: No orders of the defined types were placed in this encounter.   Medication Changes: No orders of the defined types were placed in this encounter.   Disposition:  FU with me in 6 months.   Patient is agreeable to this plan and will call if any problems develop in the interim.   Amie Critchley, NP  05/29/2019 8:37 AM    [place]

## 2019-05-24 ENCOUNTER — Other Ambulatory Visit (HOSPITAL_COMMUNITY): Payer: Medicare Other

## 2019-05-28 DIAGNOSIS — M0579 Rheumatoid arthritis with rheumatoid factor of multiple sites without organ or systems involvement: Secondary | ICD-10-CM | POA: Diagnosis not present

## 2019-05-28 DIAGNOSIS — M1990 Unspecified osteoarthritis, unspecified site: Secondary | ICD-10-CM | POA: Diagnosis not present

## 2019-05-28 DIAGNOSIS — Z79899 Other long term (current) drug therapy: Secondary | ICD-10-CM | POA: Diagnosis not present

## 2019-05-28 DIAGNOSIS — M810 Age-related osteoporosis without current pathological fracture: Secondary | ICD-10-CM | POA: Diagnosis not present

## 2019-05-29 ENCOUNTER — Other Ambulatory Visit: Payer: Self-pay

## 2019-05-29 ENCOUNTER — Encounter: Payer: Self-pay | Admitting: Nurse Practitioner

## 2019-05-29 ENCOUNTER — Telehealth (INDEPENDENT_AMBULATORY_CARE_PROVIDER_SITE_OTHER): Payer: Medicare Other | Admitting: Nurse Practitioner

## 2019-05-29 VITALS — BP 122/63 | HR 67 | Ht 63.5 in | Wt 143.2 lb

## 2019-05-29 DIAGNOSIS — R002 Palpitations: Secondary | ICD-10-CM | POA: Diagnosis not present

## 2019-05-29 DIAGNOSIS — Z87891 Personal history of nicotine dependence: Secondary | ICD-10-CM

## 2019-05-29 DIAGNOSIS — R0789 Other chest pain: Secondary | ICD-10-CM

## 2019-05-29 DIAGNOSIS — I259 Chronic ischemic heart disease, unspecified: Secondary | ICD-10-CM

## 2019-05-29 DIAGNOSIS — Z7189 Other specified counseling: Secondary | ICD-10-CM

## 2019-05-29 DIAGNOSIS — Z8249 Family history of ischemic heart disease and other diseases of the circulatory system: Secondary | ICD-10-CM

## 2019-05-29 DIAGNOSIS — I10 Essential (primary) hypertension: Secondary | ICD-10-CM | POA: Diagnosis not present

## 2019-05-29 DIAGNOSIS — E785 Hyperlipidemia, unspecified: Secondary | ICD-10-CM

## 2019-05-29 DIAGNOSIS — E782 Mixed hyperlipidemia: Secondary | ICD-10-CM

## 2019-05-29 DIAGNOSIS — M069 Rheumatoid arthritis, unspecified: Secondary | ICD-10-CM

## 2019-05-29 DIAGNOSIS — R079 Chest pain, unspecified: Secondary | ICD-10-CM

## 2019-05-29 NOTE — Patient Instructions (Addendum)
After Visit Summary:  We will be checking the following labs today - NONE  I would like to get a BMET to recheck your potassium in about a month - let us know if you would like for [place] to do or let Dr. Forde Dandy if you are able to establish with him.    Medication Instructions:    Continue with your current medicines.    If you need a refill on your cardiac medications before your next appointment, please call your pharmacy.     Testing/Procedures To Be Arranged:  N/A  Follow-Up:   See me in 6 months.    At [facility], you and your health needs are our priority.  As part of our continuing mission to provide you with exceptional heart care, we have created designated Provider Care Teams.  These Care Teams include your primary Cardiologist (physician) and Advanced Practice Providers (APPs -  Physician Assistants and Nurse Practitioners) who all work together to provide you with the care you need, when you need it.  Special Instructions:  . Stay safe, stay home, wash your hands for at least 20 seconds and wear a mask when out in public.  . It was good to talk with you today.  Marland Kitchen Keep a check on your BP for [place].  Jim Desanctis you are feeling better.    Call the [place] office at [phone] if you have any questions, problems or concerns.

## 2019-06-24 ENCOUNTER — Ambulatory Visit (INDEPENDENT_AMBULATORY_CARE_PROVIDER_SITE_OTHER): Payer: Medicare Other | Admitting: Primary Care

## 2019-06-24 ENCOUNTER — Telehealth: Payer: Self-pay | Admitting: Internal Medicine

## 2019-06-24 ENCOUNTER — Encounter: Payer: Self-pay | Admitting: Primary Care

## 2019-06-24 DIAGNOSIS — J449 Chronic obstructive pulmonary disease, unspecified: Secondary | ICD-10-CM

## 2019-06-24 MED ORDER — DULERA 200-5 MCG/ACT IN AERO: 2.0000 | INHALATION_SPRAY | Freq: Two times a day (BID) | RESPIRATORY_TRACT | 3 refills | Status: DC

## 2019-06-24 NOTE — Telephone Encounter (Signed)
Spoke with the pt  She is c/o increased SOB, wheezing and cough with green sputum for the past few wks  She states that this has been off and on since Dec 2020  She is getting winded just walking across the room  Video visit with Beth at 4:30 today

## 2019-06-24 NOTE — Patient Instructions (Addendum)
Recommendations: Start Dulera 200- take two puffs twice daily   Follow-up: 1 month with Dr. Melvyn Novas or [facility]

## 2019-06-24 NOTE — Progress Notes (Signed)
Virtual Visit via Telephone Note  I connected with Brandy Townsend on 06/24/19 at  4:30 PM EDT by telephone and verified that I am speaking with the correct person using two identifiers.  Location: Patient: Home Provider: Office   I discussed the limitations, risks, security and privacy concerns of performing an evaluation and management service by telephone and the availability of in person appointments. I also discussed with the patient that there may be a patient responsible charge related to this service. The patient expressed understanding and agreed to proceed.   History of Present Illness: 69 year old female, former smoker quit in 2011.  Past medical history significant for COPD/asthma overlap, hypertension, hypothyroidism, rheumatoid arthritis.  Patient of Dr. Melvyn Novas, last seen October 2018.  Maintained on Symbicort 80 puffs twice daily.  06/24/2019 Patient contacted today for acute televisit.  Reports increased shortness of breath and cough for the last 3 days. States that she has a hard time walking up steps. Feels she can not take a deep breath. She stopped taking her Symbicort around 2018. States that her cough returned this past year. She had a virtual visit with her PCP at the time and they did not feel she needed to be on prednisone. She eventually ended up getting a steroid shot and antibiotic but her symptoms never fully went away. After getting her covid vaccine she reports her O2 was 87% RA and BP was elevated. She went to ED for shortness of breath, she received albuterol/atrovent treatment with improvement and was discharged with prednisone taper/zpack. She was referred to cardiology where she had a normal stress test and echocardiogram.   Observations/Objective:  - No significant shortness of breath or wheezing - Moderate cough  Significant testing:  01/13/2017 PFT- minimal obstructive airway disease FVC, FEV1, FEV1/FVC ratio and FEF25-75% are reduced indicating airway  obstruction. The airway resistance is normal. The TLC, RV, FRC and RV/TLC ratio are all increased indicating overinflation and air trapping. Following administration of bronchodilators, there is no significant response  05/14/19 Echocardiogram- LVEF 55-60%, normal function. PA pressure normal.  05/14/19 Nuclear stress test- EF 77%. Horizontal ST segmental depression of 33mm was noted and returned to baseline after 5-9 minutes of recovery. Normal, low risk study    Assessment and Plan:  Obstructive airway disease/asthma overlap: - Increased shortness of breath and cough  - Two rounds of oral prednisone over last several months  - Resume ICS/LABA, sending in RX dulera 200 - take two puffs twice daily with aero chamber (rinse mouth after use) - Continue Robitussin as needed for cough  Follow Up Instructions:  - FU in 4 weeks)   I discussed the assessment and treatment plan with the patient. The patient was provided an opportunity to ask questions and all were answered. The patient agreed with the plan and demonstrated an understanding of the instructions.   The patient was advised to call back or seek an in-person evaluation if the symptoms worsen or if the condition fails to improve as anticipated.  I provided 22 minutes of non-face-to-face time during this encounter.   Martyn Ehrich, NP

## 2019-07-02 DIAGNOSIS — E042 Nontoxic multinodular goiter: Secondary | ICD-10-CM | POA: Diagnosis not present

## 2019-07-02 DIAGNOSIS — J449 Chronic obstructive pulmonary disease, unspecified: Secondary | ICD-10-CM | POA: Diagnosis not present

## 2019-07-02 DIAGNOSIS — E890 Postprocedural hypothyroidism: Secondary | ICD-10-CM | POA: Diagnosis not present

## 2019-07-02 DIAGNOSIS — M810 Age-related osteoporosis without current pathological fracture: Secondary | ICD-10-CM | POA: Diagnosis not present

## 2019-07-04 ENCOUNTER — Other Ambulatory Visit: Payer: Self-pay | Admitting: Endocrinology

## 2019-07-04 DIAGNOSIS — E042 Nontoxic multinodular goiter: Secondary | ICD-10-CM

## 2019-07-05 ENCOUNTER — Other Ambulatory Visit: Payer: Self-pay | Admitting: Endocrinology

## 2019-07-05 DIAGNOSIS — J45909 Unspecified asthma, uncomplicated: Secondary | ICD-10-CM

## 2019-07-05 DIAGNOSIS — J439 Emphysema, unspecified: Secondary | ICD-10-CM

## 2019-07-08 DIAGNOSIS — Z6824 Body mass index (BMI) 24.0-24.9, adult: Secondary | ICD-10-CM | POA: Diagnosis not present

## 2019-07-08 DIAGNOSIS — Z01419 Encounter for gynecological examination (general) (routine) without abnormal findings: Secondary | ICD-10-CM | POA: Diagnosis not present

## 2019-07-08 DIAGNOSIS — Z124 Encounter for screening for malignant neoplasm of cervix: Secondary | ICD-10-CM | POA: Diagnosis not present

## 2019-07-11 ENCOUNTER — Other Ambulatory Visit: Payer: Self-pay | Admitting: Endocrinology

## 2019-07-11 DIAGNOSIS — J45909 Unspecified asthma, uncomplicated: Secondary | ICD-10-CM

## 2019-07-11 DIAGNOSIS — J439 Emphysema, unspecified: Secondary | ICD-10-CM

## 2019-07-12 ENCOUNTER — Ambulatory Visit
Admission: RE | Admit: 2019-07-12 | Discharge: 2019-07-12 | Disposition: A | Discharge status: Home / Self Care | Payer: Medicare Other | Source: Ambulatory Visit | Attending: Endocrinology | Admitting: Endocrinology

## 2019-07-12 DIAGNOSIS — E042 Nontoxic multinodular goiter: Secondary | ICD-10-CM

## 2019-07-12 DIAGNOSIS — E041 Nontoxic single thyroid nodule: Secondary | ICD-10-CM | POA: Diagnosis not present

## 2019-07-17 ENCOUNTER — Other Ambulatory Visit: Payer: Self-pay | Admitting: Endocrinology

## 2019-07-17 DIAGNOSIS — E042 Nontoxic multinodular goiter: Secondary | ICD-10-CM

## 2019-07-18 ENCOUNTER — Encounter (HOSPITAL_COMMUNITY): Payer: Self-pay

## 2019-07-18 ENCOUNTER — Other Ambulatory Visit: Payer: Self-pay

## 2019-07-18 ENCOUNTER — Emergency Department (HOSPITAL_COMMUNITY): Payer: Medicare Other

## 2019-07-18 ENCOUNTER — Inpatient Hospital Stay (HOSPITAL_COMMUNITY)
Admission: EM | Admit: 2019-07-18 | Discharge: 2019-07-19 | DRG: 694 | Disposition: A | Discharge status: Home / Self Care | Payer: Medicare Other | Attending: Urology | Admitting: Urology

## 2019-07-18 DIAGNOSIS — Z87891 Personal history of nicotine dependence: Secondary | ICD-10-CM | POA: Diagnosis not present

## 2019-07-18 DIAGNOSIS — N132 Hydronephrosis with renal and ureteral calculous obstruction: Secondary | ICD-10-CM | POA: Diagnosis not present

## 2019-07-18 DIAGNOSIS — Z20822 Contact with and (suspected) exposure to covid-19: Secondary | ICD-10-CM | POA: Diagnosis not present

## 2019-07-18 DIAGNOSIS — E0500 Thyrotoxicosis with diffuse goiter without thyrotoxic crisis or storm: Secondary | ICD-10-CM | POA: Diagnosis not present

## 2019-07-18 DIAGNOSIS — R1084 Generalized abdominal pain: Secondary | ICD-10-CM | POA: Diagnosis not present

## 2019-07-18 DIAGNOSIS — M069 Rheumatoid arthritis, unspecified: Secondary | ICD-10-CM | POA: Diagnosis present

## 2019-07-18 DIAGNOSIS — R52 Pain, unspecified: Secondary | ICD-10-CM | POA: Diagnosis not present

## 2019-07-18 DIAGNOSIS — R109 Unspecified abdominal pain: Secondary | ICD-10-CM | POA: Diagnosis not present

## 2019-07-18 DIAGNOSIS — N200 Calculus of kidney: Secondary | ICD-10-CM | POA: Diagnosis present

## 2019-07-18 DIAGNOSIS — Z79899 Other long term (current) drug therapy: Secondary | ICD-10-CM

## 2019-07-18 DIAGNOSIS — I10 Essential (primary) hypertension: Secondary | ICD-10-CM | POA: Diagnosis present

## 2019-07-18 DIAGNOSIS — R1031 Right lower quadrant pain: Secondary | ICD-10-CM | POA: Diagnosis not present

## 2019-07-18 DIAGNOSIS — M810 Age-related osteoporosis without current pathological fracture: Secondary | ICD-10-CM | POA: Diagnosis present

## 2019-07-18 DIAGNOSIS — N201 Calculus of ureter: Secondary | ICD-10-CM | POA: Diagnosis not present

## 2019-07-18 DIAGNOSIS — R110 Nausea: Secondary | ICD-10-CM | POA: Diagnosis not present

## 2019-07-18 LAB — URINALYSIS, ROUTINE W REFLEX MICROSCOPIC
Bacteria, UA: NONE SEEN
Bilirubin Urine: NEGATIVE
Glucose, UA: NEGATIVE mg/dL
Ketones, ur: NEGATIVE mg/dL
Leukocytes,Ua: NEGATIVE
Nitrite: NEGATIVE
Protein, ur: NEGATIVE mg/dL
Specific Gravity, Urine: 1.019 (ref 1.005–1.030)
pH: 8 (ref 5.0–8.0)

## 2019-07-18 LAB — COMPREHENSIVE METABOLIC PANEL
ALT: 22 U/L (ref 0–44)
AST: 23 U/L (ref 15–41)
Albumin: 3.5 g/dL (ref 3.5–5.0)
Alkaline Phosphatase: 86 U/L (ref 38–126)
Anion gap: 7 (ref 5–15)
BUN: 15 mg/dL (ref 8–23)
CO2: 28 mmol/L (ref 22–32)
Calcium: 9 mg/dL (ref 8.9–10.3)
Chloride: 108 mmol/L (ref 98–111)
Creatinine, Ser: 1.01 mg/dL — ABNORMAL HIGH (ref 0.44–1.00)
GFR calc Af Amer: >60 mL/min (ref >60)
GFR calc non Af Amer: 58 mL/min — ABNORMAL LOW (ref >60)
Glucose, Bld: 118 mg/dL — ABNORMAL HIGH (ref 70–99)
Potassium: 4 mmol/L (ref 3.5–5.1)
Sodium: 143 mmol/L (ref 135–145)
Total Bilirubin: 0.3 mg/dL (ref 0.3–1.2)
Total Protein: 6.6 g/dL (ref 6.5–8.1)

## 2019-07-18 LAB — RESPIRATORY PANEL BY RT PCR (FLU A&B, COVID)
Influenza A by PCR: NEGATIVE
Influenza B by PCR: NEGATIVE
SARS Coronavirus 2 by RT PCR: NEGATIVE

## 2019-07-18 LAB — CBC
HCT: 43.9 % (ref 36.0–46.0)
Hemoglobin: 13.6 g/dL (ref 12.0–15.0)
MCH: 29.6 pg (ref 26.0–34.0)
MCHC: 31 g/dL (ref 30.0–36.0)
MCV: 95.6 fL (ref 80.0–100.0)
Platelets: 284 10*3/uL (ref 150–400)
RBC: 4.59 MIL/uL (ref 3.87–5.11)
RDW: 13.1 % (ref 11.5–15.5)
WBC: 9.2 10*3/uL (ref 4.0–10.5)
nRBC: 0 % (ref 0.0–0.2)

## 2019-07-18 LAB — LIPASE, BLOOD: Lipase: 23 U/L (ref 11–51)

## 2019-07-18 MED ORDER — ACETAMINOPHEN 500 MG PO TABS
1000.0000 mg | ORAL_TABLET | Freq: Four times a day (QID) | ORAL | Status: DC
  Administered 2019-07-18 – 2019-07-19 (×3): 1000 mg via ORAL
  Filled 2019-07-18 (×3): qty 2

## 2019-07-18 MED ORDER — SODIUM CHLORIDE 0.9 % IV BOLUS
1000.0000 mL | Freq: Once | INTRAVENOUS | Status: AC
  Administered 2019-07-18: 1000 mL via INTRAVENOUS

## 2019-07-18 MED ORDER — OXYCODONE HCL 5 MG PO TABS: 5.0000 mg | ORAL_TABLET | ORAL | Status: DC | PRN

## 2019-07-18 MED ORDER — IOHEXOL 300 MG/ML  SOLN
100.0000 mL | Freq: Once | INTRAMUSCULAR | Status: AC | PRN
  Administered 2019-07-18: 14:00:00 100 mL via INTRAVENOUS

## 2019-07-18 MED ORDER — ONDANSETRON HCL 4 MG PO TABS: 4.0000 mg | ORAL_TABLET | Freq: Three times a day (TID) | ORAL | 0 refills | Status: DC | PRN

## 2019-07-18 MED ORDER — MORPHINE SULFATE (PF) 4 MG/ML IV SOLN
4.0000 mg | Freq: Once | INTRAVENOUS | Status: AC
  Administered 2019-07-18: 4 mg via INTRAVENOUS
  Filled 2019-07-18: qty 1

## 2019-07-18 MED ORDER — FENTANYL CITRATE (PF) 100 MCG/2ML IJ SOLN
50.0000 ug | Freq: Once | INTRAMUSCULAR | Status: AC
  Administered 2019-07-18: 50 ug via INTRAVENOUS
  Filled 2019-07-18: qty 2

## 2019-07-18 MED ORDER — TAMSULOSIN HCL 0.4 MG PO CAPS: 0.4000 mg | ORAL_CAPSULE | Freq: Every day | ORAL | 0 refills | Status: DC

## 2019-07-18 MED ORDER — ONDANSETRON HCL 4 MG/2ML IJ SOLN
4.0000 mg | Freq: Once | INTRAMUSCULAR | Status: AC
  Administered 2019-07-18: 4 mg via INTRAVENOUS
  Filled 2019-07-18: qty 2

## 2019-07-18 MED ORDER — TAMSULOSIN HCL 0.4 MG PO CAPS
0.4000 mg | ORAL_CAPSULE | Freq: Every day | ORAL | Status: DC
  Administered 2019-07-18: 0.4 mg via ORAL
  Filled 2019-07-18: qty 1

## 2019-07-18 MED ORDER — DEXTROSE-NACL 5-0.45 % IV SOLN: INTRAVENOUS | Status: DC

## 2019-07-18 MED ORDER — MORPHINE SULFATE (PF) 2 MG/ML IV SOLN: 2.0000 mg | INTRAVENOUS | Status: DC | PRN

## 2019-07-18 MED ORDER — HYDROCODONE-ACETAMINOPHEN 5-325 MG PO TABS: 1.0000 | ORAL_TABLET | Freq: Four times a day (QID) | ORAL | 0 refills | Status: DC | PRN

## 2019-07-18 MED ORDER — SODIUM CHLORIDE 0.9% FLUSH
3.0000 mL | Freq: Once | INTRAVENOUS | Status: AC
  Administered 2019-07-18: 3 mL via INTRAVENOUS

## 2019-07-18 MED ORDER — ONDANSETRON HCL 4 MG/2ML IJ SOLN: 4.0000 mg | INTRAMUSCULAR | Status: DC | PRN

## 2019-07-18 MED ORDER — KETOROLAC TROMETHAMINE 15 MG/ML IJ SOLN
15.0000 mg | Freq: Once | INTRAMUSCULAR | Status: AC
  Administered 2019-07-18: 15 mg via INTRAVENOUS
  Filled 2019-07-18: qty 1

## 2019-07-18 MED ORDER — KETOROLAC TROMETHAMINE 15 MG/ML IJ SOLN
15.0000 mg | Freq: Four times a day (QID) | INTRAMUSCULAR | Status: DC
  Administered 2019-07-18 – 2019-07-19 (×3): 15 mg via INTRAVENOUS
  Filled 2019-07-18 (×4): qty 1

## 2019-07-18 NOTE — ED Notes (Signed)
[facility] called to transport pt to [facility]

## 2019-07-18 NOTE — H&P (Signed)
Urology History and Physical  Admitting Attending: Nicolette Bang MD  Chief Complaint:  Nephrolithiasis  HPI: Brandy Townsend is a 69 y.o. female with distal right ureteral stone and evidence of forniceal rupture.  She presented to the ED on 07/18/2019 with a history of several hours of severe right flank pain and emesis that started abruptly this morning.  She had 3 episodes of emesis today although that is currently improving.  A CT scan in the emergency department demonstrated 4 mm obstructing calculus at the right ureterovesicular junction with mild proximal right hydroureteronephrosis and right periureteral and perinephric fluid and stranding.  She was initially going to try to go home from the emergency department given that she had no signs of infection and vitals labs were reassuring.  However, her pain was not well managed in the emergency department, and urology was asked to evaluate for admission and possible intervention.  She denies any urinary symptoms including dysuria, hematuria, frequency, urgency.  She takes no blood thinners.  Past Medical History: Past Medical History:  Diagnosis Date  . Graves disease   . Hypertension   . Osteoporosis   . Rheumatoid arthritis ([place])   . Thyroid disease   . Vertigo     Past Surgical History:  History reviewed. No pertinent surgical history.  Medication: Current Facility-Administered Medications  Medication Dose Route Frequency Provider Last Rate Last Admin  . acetaminophen (TYLENOL) tablet 1,000 mg  1,000 mg Oral Q6H Haskel Schroeder, MD      . dextrose 5 %-0.45 % sodium chloride infusion   Intravenous Continuous Haskel Schroeder, MD      . Derrill Memo ON 07/19/2019] ketorolac (TORADOL) 15 MG/ML injection 15 mg  15 mg Intravenous Q6H Haskel Schroeder, MD      . morphine 2 MG/ML injection 2-4 mg  2-4 mg Intravenous Q2H PRN Haskel Schroeder, MD      . ondansetron [facility]) injection 4 mg  4 mg Intravenous Q4H PRN Haskel Schroeder, MD      . oxyCODONE (Oxy IR/ROXICODONE) immediate release tablet 5 mg  5 mg Oral Q4H PRN Haskel Schroeder, MD      . tamsulosin (FLOMAX) capsule 0.4 mg  0.4 mg Oral QPC supper Haskel Schroeder, MD        Allergies: Allergies  Allergen Reactions  . Macrobid [employer] Macro] Other (See Comments)    Lowered potassium, caused aches and pains    Social History: Social History   Tobacco Use  . Smoking status: Former Smoker    Quit date: 03/28/2009    Years since quitting: 10.3  . Smokeless tobacco: Never Used  Substance Use Topics  . Alcohol use: No  . Drug use: No    Family History No family history on file.  Review of Systems 10 systems were reviewed and are negative except as noted specifically in the HPI.  Objective   Vital signs in last 24 hours: BP 113/66 (BP Location: Right Arm)   Pulse 72   Temp 98 F (36.7 C) (Oral)   Resp 14   Ht 5\' 4"  (1.626 m)   Wt 70.6 kg   SpO2 92%   BMI 26.72 kg/m   Physical Exam General: NAD, A&O, resting, appropriate HEENT: [place]/AT, EOMI Pulmonary: Normal work of breathing Cardiovascular: HDS, adequate peripheral perfusion Abdomen: Soft, mildly TTP in the right lower quadrant, otherwise nontender. GU: Subjective mild right CVA tenderness.  No left CVA tenderness. Extremities: warm and well perfused Neuro:  Appropriate, no focal neurological deficits  Most Recent Labs: Lab Results  Component Value Date   WBC 9.2 07/18/2019   HGB 13.6 07/18/2019   HCT 43.9 07/18/2019   PLT 284 07/18/2019    Lab Results  Component Value Date   NA 143 07/18/2019   K 4.0 07/18/2019   CL 108 07/18/2019   CO2 28 07/18/2019   BUN 15 07/18/2019   CREATININE 1.01 (H) 07/18/2019   CALCIUM 9.0 07/18/2019    No results found for: INR, APTT   IMAGING: CT ABDOMEN PELVIS W CONTRAST  Result Date: 07/18/2019 CLINICAL DATA:  69 year old female with history of right lower quadrant abdominal pain and nausea starting at  4 a.m. this morning. EXAM: CT ABDOMEN AND PELVIS WITH CONTRAST TECHNIQUE: Multidetector CT imaging of the abdomen and pelvis was performed using the standard protocol following bolus administration of intravenous contrast. CONTRAST:  177mL OMNIPAQUE IOHEXOL 300 MG/ML  SOLN COMPARISON:  No priors. FINDINGS: Lower chest: Atherosclerotic calcifications in the descending thoracic aorta as well as the right coronary artery. Hepatobiliary: No suspicious cystic or solid hepatic lesions. No intra or extrahepatic biliary ductal dilatation. Gallbladder is normal in appearance. Pancreas: No pancreatic mass. No pancreatic ductal dilatation. No peripancreatic fluid collections or inflammatory changes. Spleen: Unremarkable. Adrenals/Urinary Tract: 4 mm calculus at the right ureterovesicular junction with mild proximal right hydroureteronephrosis and extensive perinephric soft tissue stranding and perinephric and periureteric fluid indicative of obstruction. Bilateral kidneys and adrenal glands are otherwise normal. No left hydroureteronephrosis. Urinary bladder is normal in appearance. Stomach/Bowel: Normal appearance of the stomach. No pathologic dilatation of small bowel or colon. Numerous colonic diverticulae are noted, particularly in the sigmoid colon, without surrounding inflammatory changes to suggest an acute diverticulitis at this time. Normal appendix. Vascular/Lymphatic: Aortic atherosclerosis, without evidence of aneurysm or dissection in the abdominal or pelvic vasculature. No lymphadenopathy noted in the abdomen or pelvis. Reproductive: Uterus and ovaries are unremarkable in appearance. Other: Trace volume of ascites in the right pericolic gutter. No pneumoperitoneum. Musculoskeletal: There are no aggressive appearing lytic or blastic lesions noted in the visualized portions of the skeleton. IMPRESSION: 1. 4 mm obstructing calculus at the right ureterovesicular junction with mild proximal right hydroureteronephrosis  but extensive right periureteric and perinephric fluid and soft tissue stranding. 2. Aortic atherosclerosis, in addition to least right coronary artery disease. Please note that although the presence of coronary artery calcium documents the presence of coronary artery disease, the severity of this disease and any potential stenosis cannot be assessed on this non-gated CT examination. Assessment for potential risk factor modification, dietary therapy or pharmacologic therapy may be warranted, if clinically indicated. 3. Colonic diverticulosis without evidence of acute diverticulitis at this time. Electronically Signed   By: Vinnie Langton M.D.   On: 07/18/2019 14:52    ------  Assessment:  69 y.o. female with distal right ureteral stone and poorly controlled pain.  Her nausea has nearly resolved by the time of admission, and she was able to start eating some crackers.  She has no leukocytosis, no AKI, and no evidence of infection on urinalysis.  Vitals are within normal limits upon admission.  We discussed with patient that we would continue with pain and nausea management at this time.  She will be n.p.o. at midnight in case surgical intervention is indicated tomorrow with right ureteral stent placement.  Risk and benefits of procedure as well as technique were briefly reviewed with patient tonight.  We also reviewed that if she became febrile  or hemodynamically unstable overnight then more urgent surgical intervention would be indicated.   Plan: -Admit to urology service -Continue pain regimen -Zofran as needed -N.p.o. at midnight in case surgical intervention is needed tomorrow; mIVF -Morning labs -Covid test negative in ED today

## 2019-07-18 NOTE — ED Triage Notes (Signed)
Per [facility] pt from home c/o RLQ pain and nausea starting at 0400 this am getting progressively worse. Denies D/V  BP 144/86 HR 70 99% RA  97.6  18g LAC  4 mg Zofran IVP

## 2019-07-18 NOTE — ED Notes (Signed)
Pt provided with graham crackers and coke as PO Challenge

## 2019-07-18 NOTE — ED Provider Notes (Signed)
[facility] EMERGENCY DEPARTMENT Provider Note   CSN: [id number] Arrival date & time: 07/18/19  [id number]     History Chief Complaint  Patient presents with  . Abdominal Pain    Brandy Townsend is a 69 y.o. female presenting for evaluation of abdominal pain.  Patient states she was awoken from sleep with severe right-sided abdominal pain around 4:00 this morning.  She has associated nausea, no vomiting.  Pain is constant and severe, nothing makes better or worse.  She is not taking anything for pain including Tylenol ibuprofen.  She has fevers, chills, chest pain, shortness of breath, cough.  She did urinate this morning without hematuria, urinary frequency, or dysuria.  Pain improved slightly after urination, but promptly returned.  She had normal bowel movement yesterday.  She denies vaginal bleeding or discharge.  She denies previous history of abdominal problems or surgeries.  No history of kidney stones.  Additional history came for chart review.  Patient with a history of Graves', hypertension, RA, thyroid disease.  HPI     Past Medical History:  Diagnosis Date  . Graves disease   . Hypertension   . Osteoporosis   . Rheumatoid arthritis ([place])   . Thyroid disease   . Vertigo     Patient Active Problem List   Diagnosis Date Noted  . Right kidney stone 07/18/2019  . Rheumatoid arthritis ([place]) 01/14/2017  . Pain in both hands 05/12/2016  . Chronic left shoulder pain 05/12/2016  . Chronic sinusitis of both maxillary sinuses 03/24/2016  . 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 Chest pain syndrome 05/16/2015  . HTN (hypertension) 05/16/2015  . Hyperthyroidism 05/16/2015  . Prerenal azotemia 05/16/2015    History reviewed. No pertinent surgical history.   OB History   No obstetric history on file.     No family history on file.  Social History   Tobacco Use  . Smoking status: Former Smoker    Quit date: 03/28/2009    Years  since quitting: 10.3  . Smokeless tobacco: Never Used  Substance Use Topics  . Alcohol use: No  . Drug use: No    Home Medications Prior to Admission medications   Medication Sig Start Date End Date Taking? Authorizing Provider  acetaminophen (TYLENOL) 325 MG tablet Take 325 mg by mouth every 6 (six) hours as needed for moderate pain.   Yes [provider]  cetirizine (ZYRTEC) 10 MG tablet Take 10 mg by mouth at bedtime as needed for allergies.    Yes [provider]  cholecalciferol (VITAMIN D3) 25 MCG (1000 UNIT) tablet Take 1,000 Units by mouth daily.   Yes [provider]  ibuprofen (ADVIL) 200 MG tablet Take 200 mg by mouth every 6 (six) hours as needed for moderate pain.   Yes [provider]  KLOR-CON 10 10 MEQ tablet Take 10 mEq by mouth daily. 08/11/14  Yes [provider]  leflunomide (ARAVA) 20 MG tablet Take 20 mg by mouth daily. 05/02/19  Yes [provider]  levothyroxine (SYNTHROID) 50 MCG tablet Take 50 mcg by mouth daily.  08/16/18  Yes [provider]  mometasone-formoterol (DULERA) 200-5 MCG/ACT AERO Inhale 2 puffs into the lungs in the morning and at bedtime. Patient taking differently: Inhale 1 puff into the lungs in the morning and at bedtime.  06/24/19  Yes Martyn Ehrich, NP  torsemide (DEMADEX) 20 MG tablet Take 1 tablet (20 mg total) by mouth daily. 05/15/19  Yes Burtis Junes, NP  verapamil (CALAN-SR) 240 MG CR tablet Take 1 tablet (240 mg total) by mouth daily. 05/13/19  Yes Burtis Junes, NP  HYDROcodone-acetaminophen (NORCO/VICODIN) 5-325 MG tablet Take 1 tablet by mouth every 6 (six) hours as needed for severe pain. 07/18/19   Sherisa Gilvin, PA-C  ondansetron (ZOFRAN) 4 MG tablet Take 1 tablet (4 mg total) by mouth every 8 (eight) hours as needed for nausea or vomiting. 07/18/19   Carla Whilden, PA-C  tamsulosin (FLOMAX) 0.4 MG CAPS capsule Take 1 capsule (0.4 mg total) by mouth daily.  07/18/19   Kaylem Gidney, PA-C    Allergies    Macrobid [nitrofurantoin monohyd macro]  Review of Systems   Review of Systems  Gastrointestinal: Positive for abdominal pain and nausea.  All other systems reviewed and are negative.   Physical Exam Updated Vital Signs BP 115/82 (BP Location: Right Arm)   Pulse 72   Temp 98.2 F (36.8 C) (Oral)   Resp 18   Ht 5\' 4"  (1.626 m)   Wt 64.4 kg   SpO2 99%   BMI 24.37 kg/m   Physical Exam Vitals and nursing note reviewed.  Constitutional:      General: She is not in acute distress.    Appearance: She is well-developed.     Comments: Appears uncomfortable due to pain  HENT:     Head: Normocephalic and atraumatic.  Eyes:     Conjunctiva/sclera: Conjunctivae normal.     Pupils: Pupils are equal, round, and reactive to light.  Cardiovascular:     Rate and Rhythm: Normal rate and regular rhythm.     Pulses: Normal pulses.  Pulmonary:     Effort: Pulmonary effort is normal. No respiratory distress.     Breath sounds: Normal breath sounds. No wheezing.  Abdominal:     General: There is no distension.     Palpations: Abdomen is soft. There is no mass.     Tenderness: There is abdominal tenderness. There is guarding. There is no rebound.     Comments: Tenderness palpation of right lower quadrant with voluntary guarding.  No rigidity, or distention.  Negative rebound.  No peritonitis.  Musculoskeletal:        General: Normal range of motion.     Cervical back: Normal range of motion and neck supple.  Skin:    General: Skin is warm and dry.  Neurological:     Mental Status: She is alert and oriented to person, place, and time.     ED Results / Procedures / Treatments   Labs (all labs ordered are listed, but only abnormal results are displayed) Labs Reviewed  COMPREHENSIVE METABOLIC PANEL - Abnormal; Notable for the following components:      Result Value   Glucose, Bld 118 (*)    Creatinine, Ser 1.01 (*)    GFR calc non  Af Amer 58 (*)    All other components within normal limits  URINALYSIS, ROUTINE W REFLEX MICROSCOPIC - Abnormal; Notable for the following components:   Color, Urine STRAW (*)    Hgb urine dipstick MODERATE (*)    All other components within normal limits  LIPASE, BLOOD  CBC    EKG EKG Interpretation  Date/Time:  Thursday July 18 2019 10:09:21 EDT Ventricular Rate:  72 PR Interval:  128 QRS Duration: 70 QT Interval:  390 QTC Calculation: 427 R Axis:   69 Text Interpretation: Normal sinus rhythm Septal infarct , age undetermined Abnormal ECG When  compared to prior, slower rate. no STEMI Confirmed by Antony Blackbird [phone]) on 07/18/2019 2:03:35 PM   Radiology CT ABDOMEN PELVIS W CONTRAST  Result Date: 07/18/2019 CLINICAL DATA:  69 year old female with history of right lower quadrant abdominal pain and nausea starting at 4 a.m. this morning. EXAM: CT ABDOMEN AND PELVIS WITH CONTRAST TECHNIQUE: Multidetector CT imaging of the abdomen and pelvis was performed using the standard protocol following bolus administration of intravenous contrast. CONTRAST:  130mL OMNIPAQUE IOHEXOL 300 MG/ML  SOLN COMPARISON:  No priors. FINDINGS: Lower chest: Atherosclerotic calcifications in the descending thoracic aorta as well as the right coronary artery. Hepatobiliary: No suspicious cystic or solid hepatic lesions. No intra or extrahepatic biliary ductal dilatation. Gallbladder is normal in appearance. Pancreas: No pancreatic mass. No pancreatic ductal dilatation. No peripancreatic fluid collections or inflammatory changes. Spleen: Unremarkable. Adrenals/Urinary Tract: 4 mm calculus at the right ureterovesicular junction with mild proximal right hydroureteronephrosis and extensive perinephric soft tissue stranding and perinephric and periureteric fluid indicative of obstruction. Bilateral kidneys and adrenal glands are otherwise normal. No left hydroureteronephrosis. Urinary bladder is normal in appearance.  Stomach/Bowel: Normal appearance of the stomach. No pathologic dilatation of small bowel or colon. Numerous colonic diverticulae are noted, particularly in the sigmoid colon, without surrounding inflammatory changes to suggest an acute diverticulitis at this time. Normal appendix. Vascular/Lymphatic: Aortic atherosclerosis, without evidence of aneurysm or dissection in the abdominal or pelvic vasculature. No lymphadenopathy noted in the abdomen or pelvis. Reproductive: Uterus and ovaries are unremarkable in appearance. Other: Trace volume of ascites in the right pericolic gutter. No pneumoperitoneum. Musculoskeletal: There are no aggressive appearing lytic or blastic lesions noted in the visualized portions of the skeleton. IMPRESSION: 1. 4 mm obstructing calculus at the right ureterovesicular junction with mild proximal right hydroureteronephrosis but extensive right periureteric and perinephric fluid and soft tissue stranding. 2. Aortic atherosclerosis, in addition to least right coronary artery disease. Please note that although the presence of coronary artery calcium documents the presence of coronary artery disease, the severity of this disease and any potential stenosis cannot be assessed on this non-gated CT examination. Assessment for potential risk factor modification, dietary therapy or pharmacologic therapy may be warranted, if clinically indicated. 3. Colonic diverticulosis without evidence of acute diverticulitis at this time. Electronically Signed   By: Vinnie Langton M.D.   On: 07/18/2019 14:52    Procedures Procedures (including critical care time)  Medications Ordered in ED Medications  ondansetron (ZOFRAN) injection 4 mg (0 mg Intravenous Hold 07/18/19 1830)  fentaNYL (SUBLIMAZE) injection 50 mcg (0 mcg Intravenous Hold 07/18/19 1830)  sodium chloride flush (NS) 0.9 % injection 3 mL (3 mLs Intravenous Given 07/18/19 1228)  morphine 4 MG/ML injection 4 mg (4 mg Intravenous Given 07/18/19  1227)  sodium chloride 0.9 % bolus 1,000 mL (0 mLs Intravenous Stopped 07/18/19 1429)  ondansetron (ZOFRAN) injection 4 mg (4 mg Intravenous Given 07/18/19 1251)  iohexol (OMNIPAQUE) 300 MG/ML solution 100 mL (100 mLs Intravenous Contrast Given 07/18/19 1413)  fentaNYL (SUBLIMAZE) injection 50 mcg (50 mcg Intravenous Given 07/18/19 1524)  ketorolac (TORADOL) 15 MG/ML injection 15 mg (15 mg Intravenous Given 07/18/19 1524)  sodium chloride 0.9 % bolus 1,000 mL (0 mLs Intravenous Stopped 07/18/19 1830)    ED Course  I have reviewed the triage vital signs and the nursing notes.  Pertinent labs & imaging results that were available during my care of the patient were reviewed by me and considered in my medical decision making (see chart for  details).    MDM Rules/Calculators/A&P                      Patient presented for evaluation nausea and abdominal pain.  On exam, patient appears uncomfortable, otherwise nontoxic.  Tenderness palpation of right lower quadrant with voluntary guarding.  Consider kidney stone versus appendicitis versus ovarian etiology.  Less likely UTI/[place], as patient notes out urinary symptoms.  Will obtain labs, urine, and CT abdomen pelvis for further evaluation.  Labs interpreted by me, overall reassuring.  No leukocytosis.  Electrolytes stable.  Urine shows blood, no infection.  CT abdomen pelvis shows 4 mm obstructing stone at the UVJ with extensive periureter and perinephric stranding/fluid.  Will consult with urology considering extensive fluid.  Discussed with Dr. Alyson Ingles from urology, who feels this is likely a forniceal rupture.  As patient is afebrile, without signs of infection or leukocytosis, recommends normal kidney stone management with pain and nausea control, and follow-up in the office.  Discussed findings and plan with patient.  Patient reports she continues to have pain, and has started throwing up again.  She has received fentanyl, Toradol, morphine, and  Zofran.  Patient appears as if she feels ill.  I am concerned her about her ability to go home safely.  Will reconsult with urology.  Discussed with Dr. Graylon Good with urology, who recommends admission to [facility]. Temporary admit orders placed.   Final Clinical Impression(s) / ED Diagnoses Final diagnoses:  Kidney stone    Rx / DC Orders ED Discharge Orders         Ordered    tamsulosin (FLOMAX) 0.4 MG CAPS capsule  Daily     07/18/19 1629    HYDROcodone-acetaminophen (NORCO/VICODIN) 5-325 MG tablet  Every 6 hours PRN     07/18/19 1629    ondansetron (ZOFRAN) 4 MG tablet  Every 8 hours PRN     07/18/19 1629           Juandaniel Manfredo, PA-C 07/18/19 1834    Tegeler, Gwenyth Allegra, MD 07/19/19 [phone]

## 2019-07-18 NOTE — ED Notes (Signed)
This RN attempted to call report for this pt; will re-attempt in 15-20 minutes.

## 2019-07-19 LAB — CBC
HCT: 38.4 % (ref 36.0–46.0)
Hemoglobin: 11.7 g/dL — ABNORMAL LOW (ref 12.0–15.0)
MCH: 29.9 pg (ref 26.0–34.0)
MCHC: 30.5 g/dL (ref 30.0–36.0)
MCV: 98.2 fL (ref 80.0–100.0)
Platelets: 231 10*3/uL (ref 150–400)
RBC: 3.91 MIL/uL (ref 3.87–5.11)
RDW: 13.4 % (ref 11.5–15.5)
WBC: 7.2 10*3/uL (ref 4.0–10.5)
nRBC: 0 % (ref 0.0–0.2)

## 2019-07-19 LAB — BASIC METABOLIC PANEL
Anion gap: 7 (ref 5–15)
BUN: 10 mg/dL (ref 8–23)
CO2: 23 mmol/L (ref 22–32)
Calcium: 8.4 mg/dL — ABNORMAL LOW (ref 8.9–10.3)
Chloride: 114 mmol/L — ABNORMAL HIGH (ref 98–111)
Creatinine, Ser: 0.56 mg/dL (ref 0.44–1.00)
GFR calc Af Amer: >60 mL/min (ref >60)
GFR calc non Af Amer: >60 mL/min (ref >60)
Glucose, Bld: 112 mg/dL — ABNORMAL HIGH (ref 70–99)
Potassium: 3.7 mmol/L (ref 3.5–5.1)
Sodium: 144 mmol/L (ref 135–145)

## 2019-07-19 NOTE — Clinical Social Work Note (Addendum)
  UR informed this CSW that patient may be a Code 44, patient left before Code 44 confirmation was received from physician.  Patient did not have any other needs.  Jones Broom. Deanndra Kirley, MSW, [place]  07/19/2019 1:03 PM

## 2019-07-22 ENCOUNTER — Ambulatory Visit
Admission: RE | Admit: 2019-07-22 | Discharge: 2019-07-22 | Disposition: A | Discharge status: Home / Self Care | Payer: Medicare Other | Source: Ambulatory Visit | Attending: Endocrinology | Admitting: Endocrinology

## 2019-07-22 DIAGNOSIS — J45909 Unspecified asthma, uncomplicated: Secondary | ICD-10-CM

## 2019-07-22 DIAGNOSIS — R918 Other nonspecific abnormal finding of lung field: Secondary | ICD-10-CM | POA: Diagnosis not present

## 2019-07-22 DIAGNOSIS — J439 Emphysema, unspecified: Secondary | ICD-10-CM

## 2019-07-22 MED ORDER — IOPAMIDOL (ISOVUE-300) INJECTION 61%
75.0000 mL | Freq: Once | INTRAVENOUS | Status: AC | PRN
  Administered 2019-07-22: 75 mL via INTRAVENOUS

## 2019-07-23 DIAGNOSIS — N200 Calculus of kidney: Secondary | ICD-10-CM | POA: Diagnosis not present

## 2019-07-23 DIAGNOSIS — N201 Calculus of ureter: Secondary | ICD-10-CM | POA: Diagnosis not present

## 2019-07-25 ENCOUNTER — Other Ambulatory Visit (HOSPITAL_COMMUNITY)
Admission: RE | Admit: 2019-07-25 | Discharge: 2019-07-25 | Disposition: A | Discharge status: Home / Self Care | Payer: Medicare Other | Source: Ambulatory Visit | Attending: Radiology | Admitting: Radiology

## 2019-07-25 ENCOUNTER — Ambulatory Visit
Admission: RE | Admit: 2019-07-25 | Discharge: 2019-07-25 | Disposition: A | Discharge status: Home / Self Care | Payer: Medicare Other | Source: Ambulatory Visit | Attending: Endocrinology | Admitting: Endocrinology

## 2019-07-25 DIAGNOSIS — M25572 Pain in left ankle and joints of left foot: Secondary | ICD-10-CM | POA: Diagnosis not present

## 2019-07-25 DIAGNOSIS — E042 Nontoxic multinodular goiter: Secondary | ICD-10-CM | POA: Diagnosis not present

## 2019-07-25 DIAGNOSIS — E041 Nontoxic single thyroid nodule: Secondary | ICD-10-CM | POA: Diagnosis not present

## 2019-07-25 DIAGNOSIS — D34 Benign neoplasm of thyroid gland: Secondary | ICD-10-CM | POA: Diagnosis not present

## 2019-07-26 ENCOUNTER — Ambulatory Visit: Payer: Medicare Other | Admitting: Internal Medicine

## 2019-07-26 DIAGNOSIS — M79659 Pain in unspecified thigh: Secondary | ICD-10-CM | POA: Diagnosis not present

## 2019-07-26 DIAGNOSIS — M79652 Pain in left thigh: Secondary | ICD-10-CM | POA: Diagnosis not present

## 2019-07-26 LAB — CYTOLOGY - NON PAP

## 2019-07-27 ENCOUNTER — Emergency Department (HOSPITAL_COMMUNITY)
Admission: EM | Admit: 2019-07-27 | Discharge: 2019-07-27 | Disposition: A | Discharge status: Home / Self Care | Payer: Medicare Other | Attending: Emergency Medicine | Admitting: Emergency Medicine

## 2019-07-27 ENCOUNTER — Other Ambulatory Visit: Payer: Self-pay

## 2019-07-27 ENCOUNTER — Encounter (HOSPITAL_COMMUNITY): Payer: Self-pay | Admitting: Emergency Medicine

## 2019-07-27 DIAGNOSIS — K5900 Constipation, unspecified: Secondary | ICD-10-CM | POA: Diagnosis not present

## 2019-07-27 DIAGNOSIS — Z87891 Personal history of nicotine dependence: Secondary | ICD-10-CM | POA: Insufficient documentation

## 2019-07-27 DIAGNOSIS — I10 Essential (primary) hypertension: Secondary | ICD-10-CM | POA: Diagnosis not present

## 2019-07-27 DIAGNOSIS — Z79899 Other long term (current) drug therapy: Secondary | ICD-10-CM | POA: Diagnosis not present

## 2019-07-27 DIAGNOSIS — R1012 Left upper quadrant pain: Secondary | ICD-10-CM | POA: Diagnosis not present

## 2019-07-27 DIAGNOSIS — R1032 Left lower quadrant pain: Secondary | ICD-10-CM | POA: Diagnosis present

## 2019-07-27 LAB — COMPREHENSIVE METABOLIC PANEL
ALT: 24 U/L (ref 0–44)
AST: 18 U/L (ref 15–41)
Albumin: 4.3 g/dL (ref 3.5–5.0)
Alkaline Phosphatase: 105 U/L (ref 38–126)
Anion gap: 9 (ref 5–15)
BUN: 13 mg/dL (ref 8–23)
CO2: 25 mmol/L (ref 22–32)
Calcium: 9.1 mg/dL (ref 8.9–10.3)
Chloride: 103 mmol/L (ref 98–111)
Creatinine, Ser: 0.97 mg/dL (ref 0.44–1.00)
GFR calc Af Amer: >60 mL/min (ref >60)
GFR calc non Af Amer: >60 mL/min (ref >60)
Glucose, Bld: 116 mg/dL — ABNORMAL HIGH (ref 70–99)
Potassium: 3.7 mmol/L (ref 3.5–5.1)
Sodium: 137 mmol/L (ref 135–145)
Total Bilirubin: 0.5 mg/dL (ref 0.3–1.2)
Total Protein: 7.7 g/dL (ref 6.5–8.1)

## 2019-07-27 LAB — CBC
HCT: 44.1 % (ref 36.0–46.0)
Hemoglobin: 14.2 g/dL (ref 12.0–15.0)
MCH: 30.1 pg (ref 26.0–34.0)
MCHC: 32.2 g/dL (ref 30.0–36.0)
MCV: 93.6 fL (ref 80.0–100.0)
Platelets: 316 10*3/uL (ref 150–400)
RBC: 4.71 MIL/uL (ref 3.87–5.11)
RDW: 13.1 % (ref 11.5–15.5)
WBC: 10.9 10*3/uL — ABNORMAL HIGH (ref 4.0–10.5)
nRBC: 0 % (ref 0.0–0.2)

## 2019-07-27 LAB — URINALYSIS, ROUTINE W REFLEX MICROSCOPIC
Bilirubin Urine: NEGATIVE
Glucose, UA: NEGATIVE mg/dL
Hgb urine dipstick: NEGATIVE
Ketones, ur: NEGATIVE mg/dL
Leukocytes,Ua: NEGATIVE
Nitrite: NEGATIVE
Protein, ur: NEGATIVE mg/dL
Specific Gravity, Urine: 1.009 (ref 1.005–1.030)
pH: 5 (ref 5.0–8.0)

## 2019-07-27 LAB — LIPASE, BLOOD: Lipase: 21 U/L (ref 11–51)

## 2019-07-27 MED ORDER — SODIUM CHLORIDE 0.9% FLUSH: 3.0000 mL | Freq: Once | INTRAVENOUS | Status: DC

## 2019-07-27 NOTE — ED Notes (Signed)
Pt verbalizes understanding of DC instructions. Pt belongings returned and is ambulatory out of ED.  

## 2019-07-27 NOTE — Discharge Instructions (Addendum)
To help you have the first bowel movement try drinking: Miralax 8 packets in 32oz of water and drink it over the course of 24 hours.   Below includes general information about constipation:   To help reduce constipation and promote bowel health, 1. Drink at least 64 ounces of water each day; 2. Eat plenty of fiber (fruits, vegetables, whole grains, legumes) 3. Get plenty of physical activity  If needed, you may also use daily or as needed, MiraLax (Osmotic Laxative) up to  1-2 times a day and Colace (Stool Softener aka Docusate) 100 mg up to twice a day to help with bowel movements. These medications are over the counter.  MiraLax is an Osmotic You may use other over-the-counter medications such as Dulcolax, Fleet enemas, magnesium citrate as needed for constipation. Please note that some of these medications may cause you to have abdominal cramping which is normal. If you develop severe abdominal pain, fever, vomiting, distention of your abdomen, unable to have a bowel movement for 5 days or are not passing gas, please return to the hospital.  Return to the Emergency Department for any fever, worsening pain, blood in stool, severe abdominal pain, or any other worsening or concerning symptoms.

## 2019-07-27 NOTE — ED Provider Notes (Signed)
[facility] DEPT Provider Note   CSN: [id number] Arrival date & time: 07/27/19  1046     History Chief Complaint  Patient presents with  . Constipation  . Abdominal Pain    Brandy Townsend is a 69 y.o. female medical history significant for Graves' disease hypertension, osteoporosis, rheumatoid arthritis, vertigo presents to emergency department today with chief complaint of progressively worsening abdominal pain and constipation x 1 week.  Abdominal pain is located in her left lower quadrant and is intermittent. She has waves of pain radiating to her LUQ. She rates pain 6/10 in severity. She states she has not had a bowel movement in 1 week.  She has tried taking 2 days of Colace, three MiraLAX packets, enema without any symptom relief.  Patient was admitted to the hospital on 07/18/2019 for uncontrolled pain secondary to a kidney stone.  She received narcotics during admission.  She states she passed the kidney stone the next day.  She had a follow-up appointment with Dr. Alyson Ingles and was prescribed Bactrim to cover for possible infection.  Patient states she was very busy over the last week taking her husband to multiple doctors appointments that she did not realize she had not had a bowel movement.  She states she typically goes every morning.      Past Medical History:  Diagnosis Date  . Graves disease   . Hypertension   . Osteoporosis   . Rheumatoid arthritis ([place])   . Thyroid disease   . Vertigo     Patient Active Problem List   Diagnosis Date Noted  . Right kidney stone 07/18/2019  . Nephrolithiasis 07/18/2019  . Rheumatoid arthritis ([place]) 01/14/2017  . Pain in both hands 05/12/2016  . Chronic left shoulder pain 05/12/2016  . Chronic sinusitis of both maxillary sinuses 03/24/2016  . 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 Chest pain syndrome 05/16/2015  . HTN (hypertension) 05/16/2015  . Hyperthyroidism 05/16/2015   . Prerenal azotemia 05/16/2015    History reviewed. No pertinent surgical history.   OB History   No obstetric history on file.     No family history on file.  Social History   Tobacco Use  . Smoking status: Former Smoker    Quit date: 03/28/2009    Years since quitting: 10.3  . Smokeless tobacco: Never Used  Substance Use Topics  . Alcohol use: No  . Drug use: No    Home Medications Prior to Admission medications   Medication Sig Start Date End Date Taking? Authorizing Provider  acetaminophen (TYLENOL) 325 MG tablet Take 325 mg by mouth every 6 (six) hours as needed for moderate pain.    [provider]  cetirizine (ZYRTEC) 10 MG tablet Take 10 mg by mouth at bedtime as needed for allergies.     [provider]  cholecalciferol (VITAMIN D3) 25 MCG (1000 UNIT) tablet Take 1,000 Units by mouth daily.    [provider]  HYDROcodone-acetaminophen (NORCO/VICODIN) 5-325 MG tablet Take 1 tablet by mouth every 6 (six) hours as needed for severe pain. 07/18/19   Caccavale, Sophia, PA-C  ibuprofen (ADVIL) 200 MG tablet Take 200 mg by mouth every 6 (six) hours as needed for moderate pain.    [provider]  KLOR-CON 10 10 MEQ tablet Take 10 mEq by mouth daily. 08/11/14   [provider]  leflunomide (ARAVA) 20 MG tablet Take 20 mg by mouth daily. 05/02/19   [provider]  levothyroxine (SYNTHROID) 50 MCG tablet Take 50 mcg by mouth daily.  08/16/18   [provider]  mometasone-formoterol (DULERA) 200-5 MCG/ACT AERO Inhale 2 puffs into the lungs in the morning and at bedtime. Patient taking differently: Inhale 1 puff into the lungs in the morning and at bedtime.  06/24/19   Martyn Ehrich, NP  ondansetron (ZOFRAN) 4 MG tablet Take 1 tablet (4 mg total) by mouth every 8 (eight) hours as needed for nausea or vomiting. 07/18/19   Caccavale, Sophia, PA-C  tamsulosin (FLOMAX) 0.4 MG CAPS capsule Take 1 capsule (0.4 mg total) by  mouth daily. 07/18/19   Caccavale, Sophia, PA-C  torsemide (DEMADEX) 20 MG tablet Take 1 tablet (20 mg total) by mouth daily. 05/15/19   Burtis Junes, NP  verapamil (CALAN-SR) 240 MG CR tablet Take 1 tablet (240 mg total) by mouth daily. 05/13/19   Burtis Junes, NP    Allergies    Macrobid [nitrofurantoin monohyd macro]  Review of Systems   Review of Systems All other systems are reviewed and are negative for acute change except as noted in the HPI.  Physical Exam Updated Vital Signs BP 131/72 (BP Location: Right Arm)   Pulse (!) 102   Temp 98.7 F (37.1 C) (Oral)   Resp 18   SpO2 91%   Physical Exam Vitals and nursing note reviewed.  Constitutional:      General: She is not in acute distress.    Appearance: She is not ill-appearing.  HENT:     Head: Normocephalic and atraumatic.     Right Ear: Tympanic membrane and external ear normal.     Left Ear: Tympanic membrane and external ear normal.     Nose: Nose normal.     Mouth/Throat:     Mouth: Mucous membranes are moist.     Pharynx: Oropharynx is clear.  Eyes:     General: No scleral icterus.       Right eye: No discharge.        Left eye: No discharge.     Extraocular Movements: Extraocular movements intact.     Conjunctiva/sclera: Conjunctivae normal.     Pupils: Pupils are equal, round, and reactive to light.  Neck:     Vascular: No JVD.  Cardiovascular:     Rate and Rhythm: Normal rate and regular rhythm.     Pulses: Normal pulses.          Radial pulses are 2+ on the right side and 2+ on the left side.     Heart sounds: Normal heart sounds.     Comments: Heart rate ranging from 70-84 during interview and exam Pulmonary:     Comments: Lungs clear to auscultation in all fields. Symmetric chest rise. No wheezing, rales, or rhonchi. Abdominal:     Comments: Abdomen is soft, non-distended. Mild tenderness to left lower quadrant.  No rigidity, no guarding. No peritoneal signs. No CVA tenderness.   Genitourinary:    Comments: Chaperone Wilburn Cornelia NT present for exam. Digital Rectal Exam reveals sphincter with good tone. No external hemorrhoids. No masses or fissures. No gross melena. No obstruction or impaction  Musculoskeletal:        General: Normal range of motion.     Cervical back: Normal range of motion.  Skin:    General: Skin is warm and dry.     Capillary Refill: Capillary refill takes less than 2 seconds.  Neurological:     Mental Status: She is oriented to  person, place, and time.     GCS: GCS eye subscore is 4. GCS verbal subscore is 5. GCS motor subscore is 6.     Comments: Fluent speech, no facial droop.  Psychiatric:        Behavior: Behavior normal.       ED Results / Procedures / Treatments   Labs (all labs ordered are listed, but only abnormal results are displayed) Labs Reviewed  COMPREHENSIVE METABOLIC PANEL - Abnormal; Notable for the following components:      Result Value   Glucose, Bld 116 (*)    All other components within normal limits  CBC - Abnormal; Notable for the following components:   WBC 10.9 (*)    All other components within normal limits  LIPASE, BLOOD  URINALYSIS, ROUTINE W REFLEX MICROSCOPIC    EKG None  Radiology None  Procedures Procedures (including critical care time)  Medications Ordered in ED Medications  sodium chloride flush (NS) 0.9 % injection 3 mL (3 mLs Intravenous Not Given 07/27/19 1426)    ED Course  I have reviewed the triage vital signs and the nursing notes.  Pertinent labs & imaging results that were available during my care of the patient were reviewed by me and considered in my medical decision making (see chart for details).    MDM Rules/Calculators/A&P                     History provided by patient with additional history obtained from chart review.     Patient seen and examined. Patient presents awake, alert, hemodynamically stable, afebrile, non toxic. She was noted to tachycardic to 102 in  triage. During interview and exam she was not tachycardic. She is very well appearing.  On exam she has very mild tenderness to LLQ. No peritoneal signs. No CVA tenderness. Labs were collected in triage. I viewed results which show non specific leukocytosis of 10.9, she has no other infectious symptoms. No anemia, no severe electrolyte derangement, no renal insufficiency, liver enzymes are normal, lipase is within normal range. UA is negative for infection. No blood in urine making kidney stone unlikely cause of her abdominal pain.  History and exam are consistent with constipation. Pain is in her LLQ so diverticulitis is in the differential however she has no history of it and denies any bloody diarrhea.  Rectal exam performed and there are findings to suggest impaction. Engaged in shared decision making with patient regarding further imaging. She has had 2 CT scans in the las x9 days. She is agreeable with plan to try symptomatic care and follow up with pcp and not perform additional imaging today. This is a reasonable plan. Patient has also been evaluated by ED attending Dr. Tyrone Nine who agrees with plan of care.  The patient appears reasonably screened and/or stabilized for discharge and I doubt any other medical condition or other [facility] requiring further screening, evaluation, or treatment in the ED at this time prior to discharge. The patient is safe for discharge with strict return precautions discussed. Recommend pcp follow up if symptoms persist.    Portions of this note were generated with Dragon dictation software. Dictation errors may occur despite best attempts at proofreading.   Final Clinical Impression(s) / ED Diagnoses Final diagnoses:  Constipation, unspecified constipation type    Rx / DC Orders ED Discharge Orders    None       Cherre Robins, PA-C 07/27/19 [place], DO 07/27/19  1446  

## 2019-07-27 NOTE — ED Notes (Signed)
Urine culture sent down to lab  

## 2019-07-27 NOTE — ED Notes (Signed)
ED Provider at bedside. 

## 2019-07-27 NOTE — ED Notes (Signed)
Pt ambulatory to RR 

## 2019-07-27 NOTE — ED Triage Notes (Signed)
Pt reports was here last week for kidney stone. Had BM last Saturday and none since. Reports taking stool softer and enema this morning but no relief, just mucous. Having abd pains.

## 2019-07-29 NOTE — Discharge Summary (Signed)
Physician Discharge Summary  Patient ID: Brandy Townsend MRN: [id number] DOB/AGE: Nov 06, 1950 69 y.o.  Admit date: 07/18/2019 Discharge date: 07/19/2019  Admission Diagnoses:  Right ureteral calculus  Discharge Diagnoses:  Active Problems:   Right kidney stone   Nephrolithiasis   Past Medical History:  Diagnosis Date  . Graves disease   . Hypertension   . Osteoporosis   . Rheumatoid arthritis ([place])   . Thyroid disease   . Vertigo     Surgeries:  on * No surgery found *   Consultants (if any):   Discharged Condition: Improved  Hospital Course: Brandy Townsend is an 69 y.o. female who was admitted 07/18/2019 with a diagnosis of right ureteral calculus and was admitted for pain control. Pain was controlled on hospital day #1  And patient was discharged home She was given perioperative antibiotics:  Anti-infectives (From admission, onward)   None    .  She was given sequential compression devices, early ambulation, for DVT prophylaxis.  She benefited maximally from the hospital stay and there were no complications.    Recent vital signs:  Vitals:   07/19/19 0422 07/19/19 0744  BP: (!) 105/59 108/72  Pulse: 69 62  Resp: 12 13  Temp: 97.6 F (36.4 C) 98 F (36.7 C)  SpO2: 97% 98%    Recent laboratory studies:  Lab Results  Component Value Date   HGB 14.2 07/27/2019   HGB 11.7 (L) 07/19/2019   HGB 13.6 07/18/2019   Lab Results  Component Value Date   WBC 10.9 (H) 07/27/2019   PLT 316 07/27/2019   No results found for: INR Lab Results  Component Value Date   NA 137 07/27/2019   K 3.7 07/27/2019   CL 103 07/27/2019   CO2 25 07/27/2019   BUN 13 07/27/2019   CREATININE 0.97 07/27/2019   GLUCOSE 116 (H) 07/27/2019    Discharge Medications:   Allergies as of 07/19/2019      Reactions   Macrobid [nitrofurantoin Monohyd Macro] Other (See Comments)   Lowered potassium, caused aches and pains      Medication List    TAKE these medications   acetaminophen  325 MG tablet Commonly known as: TYLENOL Take 325 mg by mouth every 6 (six) hours as needed for moderate pain.   cetirizine 10 MG tablet Commonly known as: ZYRTEC Take 10 mg by mouth at bedtime as needed for allergies.   cholecalciferol 25 MCG (1000 UNIT) tablet Commonly known as: VITAMIN D3 Take 1,000 Units by mouth daily.   Dulera 200-5 MCG/ACT Aero Generic drug: mometasone-formoterol Inhale 2 puffs into the lungs in the morning and at bedtime. What changed: how much to take   HYDROcodone-acetaminophen 5-325 MG tablet Commonly known as: NORCO/VICODIN Take 1 tablet by mouth every 6 (six) hours as needed for severe pain.   ibuprofen 200 MG tablet Commonly known as: ADVIL Take 200 mg by mouth every 6 (six) hours as needed for moderate pain.   [place]-Con 10 10 MEQ tablet Generic drug: potassium chloride Take 10 mEq by mouth daily.   leflunomide 20 MG tablet Commonly known as: ARAVA Take 20 mg by mouth daily.   levothyroxine 50 MCG tablet Commonly known as: SYNTHROID Take 50 mcg by mouth daily.   ondansetron 4 MG tablet Commonly known as: ZOFRAN Take 1 tablet (4 mg total) by mouth every 8 (eight) hours as needed for nausea or vomiting.   tamsulosin 0.4 MG Caps capsule Commonly known as: Flomax Take 1 capsule (0.4  mg total) by mouth daily.   torsemide 20 MG tablet Commonly known as: DEMADEX Take 1 tablet (20 mg total) by mouth daily.   verapamil 240 MG CR tablet Commonly known as: CALAN-SR Take 1 tablet (240 mg total) by mouth daily.       Diagnostic Studies: CT CHEST W CONTRAST  Result Date: 07/22/2019 CLINICAL DATA:  Cough and abnormal lung sounds. EXAM: CT CHEST WITH CONTRAST TECHNIQUE: Multidetector CT imaging of the chest was performed during intravenous contrast administration. CONTRAST:  32mL ISOVUE-300 IOPAMIDOL (ISOVUE-300) INJECTION 61% COMPARISON:  March 27, 2010 FINDINGS: Cardiovascular: There is mild calcification of the aortic arch. Normal heart  size. No pericardial effusion. Mediastinum/Nodes: No enlarged mediastinal, hilar, or axillary lymph nodes. A 1.9 cm x 1.5 cm well-defined hyperdense soft tissue mass, with central area of low attenuation, is seen along the lateral aspect of the right lobe of the thyroid gland. A similar appearing area is seen on the prior study. Additional predominant stable cystic appearing areas are seen within both lobes of the thyroid gland. Lungs/Pleura: Very mild, stable tree-in-bud appearing opacities are seen along the anterolateral aspect of the bilateral upper lobes. There is no evidence of a pleural effusion or pneumothorax. Upper Abdomen: No acute abnormality. Musculoskeletal: Multilevel degenerative changes seen throughout the thoracic spine. IMPRESSION: 1. Very mild, stable tree-in-bud appearing opacities along the anterolateral aspect of the bilateral upper lobes. Sequelae associated with atypical infection cannot be excluded. 2. Predominant stable thyroid goiter. Aortic Atherosclerosis (ICD10-I70.0). Electronically Signed   By: Virgina Norfolk M.D.   On: 07/22/2019 22:34   CT ABDOMEN PELVIS W CONTRAST  Result Date: 07/18/2019 CLINICAL DATA:  69 year old female with history of right lower quadrant abdominal pain and nausea starting at 4 a.m. this morning. EXAM: CT ABDOMEN AND PELVIS WITH CONTRAST TECHNIQUE: Multidetector CT imaging of the abdomen and pelvis was performed using the standard protocol following bolus administration of intravenous contrast. CONTRAST:  142mL OMNIPAQUE IOHEXOL 300 MG/ML  SOLN COMPARISON:  No priors. FINDINGS: Lower chest: Atherosclerotic calcifications in the descending thoracic aorta as well as the right coronary artery. Hepatobiliary: No suspicious cystic or solid hepatic lesions. No intra or extrahepatic biliary ductal dilatation. Gallbladder is normal in appearance. Pancreas: No pancreatic mass. No pancreatic ductal dilatation. No peripancreatic fluid collections or inflammatory  changes. Spleen: Unremarkable. Adrenals/Urinary Tract: 4 mm calculus at the right ureterovesicular junction with mild proximal right hydroureteronephrosis and extensive perinephric soft tissue stranding and perinephric and periureteric fluid indicative of obstruction. Bilateral kidneys and adrenal glands are otherwise normal. No left hydroureteronephrosis. Urinary bladder is normal in appearance. Stomach/Bowel: Normal appearance of the stomach. No pathologic dilatation of small bowel or colon. Numerous colonic diverticulae are noted, particularly in the sigmoid colon, without surrounding inflammatory changes to suggest an acute diverticulitis at this time. Normal appendix. Vascular/Lymphatic: Aortic atherosclerosis, without evidence of aneurysm or dissection in the abdominal or pelvic vasculature. No lymphadenopathy noted in the abdomen or pelvis. Reproductive: Uterus and ovaries are unremarkable in appearance. Other: Trace volume of ascites in the right pericolic gutter. No pneumoperitoneum. Musculoskeletal: There are no aggressive appearing lytic or blastic lesions noted in the visualized portions of the skeleton. IMPRESSION: 1. 4 mm obstructing calculus at the right ureterovesicular junction with mild proximal right hydroureteronephrosis but extensive right periureteric and perinephric fluid and soft tissue stranding. 2. Aortic atherosclerosis, in addition to least right coronary artery disease. Please note that although the presence of coronary artery calcium documents the presence of coronary artery disease, the severity  of this disease and any potential stenosis cannot be assessed on this non-gated CT examination. Assessment for potential risk factor modification, dietary therapy or pharmacologic therapy may be warranted, if clinically indicated. 3. Colonic diverticulosis without evidence of acute diverticulitis at this time. Electronically Signed   By: Vinnie Langton M.D.   On: 07/18/2019 14:52   [place] FNA  BX THYROID 1ST LESION AFIRMA  Result Date: 07/25/2019 INDICATION: Indeterminate thyroid nodule Right Inferior thyroid nodule 2.2 cm Hx Graves disease: Iodine 131 04/20/2017 EXAM: ULTRASOUND GUIDED FINE NEEDLE ASPIRATION OF INDETERMINATE THYROID NODULE COMPARISON:  US Thyroid 07/12/19 MEDICATIONS: 8 cc 1% lidocaine COMPLICATIONS: None immediate. TECHNIQUE: Informed written consent was obtained from the patient after a discussion of the risks, benefits and alternatives to treatment. Questions regarding the procedure were encouraged and answered. A timeout was performed prior to the initiation of the procedure. Pre-procedural ultrasound scanning demonstrated unchanged size and appearance of the indeterminate nodule within the right thyroid The procedure was planned. The neck was prepped in the usual sterile fashion, and a sterile drape was applied covering the operative field. A timeout was performed prior to the initiation of the procedure. Local anesthesia was provided with 1% lidocaine. Under direct ultrasound guidance, 5 FNA biopsies were performed of the right inferior thyroid nodule with a 25 gauge needle. 2 of these samples were obtained for [facility] as per ordering MD Multiple ultrasound images were saved for procedural documentation purposes. The samples were prepared and submitted to pathology. Limited post procedural scanning was negative for hematoma or additional complication. Dressings were placed. The patient tolerated the above procedures procedure well without immediate postprocedural complication. FINDINGS: Nodule reference number based on prior diagnostic ultrasound: 3 Maximum size: 2.2 cm Location: Right; Inferior ACR TI-RADS risk category: TR4 (4-6 points) Reason for biopsy: meets ACR TI-RADS criteria Ultrasound imaging confirms appropriate placement of the needles within the thyroid nodule. IMPRESSION: Technically successful ultrasound guided fine needle aspiration of right inferior thyroid nodule  Read by Lavonia Drafts [facility] Electronically Signed   By: Corrie Mckusick D.O.   On: 07/25/2019 15:00   US THYROID  Result Date: 07/13/2019 CLINICAL DATA:  69 year old female with history of thyroid nodules. Treated with iodine 131 04/20/2017 for nodular Graves disease EXAM: THYROID ULTRASOUND TECHNIQUE: Ultrasound examination of the thyroid gland and adjacent soft tissues was performed. COMPARISON:  06/15/2016, 11/05/2012, nuclear medicine therapy 04/20/2017 Bilateral thyroid nodule biopsy 11/13/2012 FINDINGS: Parenchymal Echotexture: Moderately heterogenous Isthmus: 0.3 cm Right lobe: 5.2 cm x 1.9 cm x 1.9 cm Left lobe: 4.8 cm x 1.9 cm x 1.9 cm _________________________________________________________ Estimated total number of nodules >/= 1 cm: 3 Number of spongiform nodules >/=  2 cm not described below (TR1): 0 Number of mixed cystic and solid nodules >/= 1.5 cm not described below (TR2): 0 _________________________________________________________ Nodule labeled 1 in the upper right thyroid, 8 mm and previously 1.5 cm. This is TR 4 nodule and does not meet criteria for surveillance or biopsy. Nodule labeled 2 mid right thyroid, previously 3.1 cm, currently 1.9 cm. This nodule was previously biopsy 2014. Assuming prior benign result no specific follow-up would be indicated. Nodule labeled 3, inferior right thyroid, 2.2 cm, previously 1.8 cm. This is TR 4 nodule and meets criteria for biopsy. Nodule labeled 4 superior left thyroid, 7 mm and previously 3.7 cm. This nodule is spongiform and does not meet criteria for surveillance or biopsy. Nodule labeled 5 inferior left thyroid, 1.0 cm, previously 1.5 cm. This nodule is spongiform and does  not meet criteria for surveillance or biopsy. No adenopathy IMPRESSION: Multinodular thyroid. Inferior right thyroid nodule (labeled 3, 2.2 cm, TR 4) meets criteria for biopsy, as designated by the newly established ACR TI-RADS criteria, and referral for biopsy is recommended.  Recommendations follow those established by the new ACR TI-RADS criteria (J Am Coll Radiol A9880051). Electronically Signed   By: Corrie Mckusick D.O.   On: 07/13/2019 12:50    Disposition: Discharge disposition: 01-Home or Self Care         Follow-up Information    Schedule an appointment as soon as possible for a visit  with Alyson Ingles Candee Furbish, MD.   Specialty: Urology Contact information: [address] [place] [address] [phone]            Signed: Nicolette Bang 07/29/2019, 8:18 AM

## 2019-08-05 DIAGNOSIS — N200 Calculus of kidney: Secondary | ICD-10-CM | POA: Diagnosis not present

## 2019-08-05 DIAGNOSIS — I2510 Atherosclerotic heart disease of native coronary artery without angina pectoris: Secondary | ICD-10-CM | POA: Diagnosis not present

## 2019-08-05 DIAGNOSIS — K5730 Diverticulosis of large intestine without perforation or abscess without bleeding: Secondary | ICD-10-CM | POA: Diagnosis not present

## 2019-08-05 DIAGNOSIS — R918 Other nonspecific abnormal finding of lung field: Secondary | ICD-10-CM | POA: Diagnosis not present

## 2019-08-07 ENCOUNTER — Telehealth: Payer: Self-pay | Admitting: Internal Medicine

## 2019-08-07 NOTE — Telephone Encounter (Signed)
Spoke with pt, she states her doctor wanted her to switch providers but she doesn't know to who. I advised her to call us back tomorrow to let us know. Pt understood. Will leave encounter open.

## 2019-08-09 ENCOUNTER — Telehealth: Payer: Self-pay | Admitting: Internal Medicine

## 2019-08-09 NOTE — Telephone Encounter (Signed)
Sorry - I didn't see the previous note sent back - my apologies!

## 2019-08-09 NOTE — Telephone Encounter (Signed)
Brandy Townsend already spoke with the pt  Please advise if okay to switch to [place] or [place], thanks! She is being referred back by Dr Forde Dandy

## 2019-08-10 NOTE — Telephone Encounter (Signed)
Fine with me

## 2019-08-12 NOTE — Telephone Encounter (Signed)
I am okay with switch. 

## 2019-08-12 NOTE — Telephone Encounter (Signed)
Dr Halford Chessman and [name], are you ok with seeing this pt?

## 2019-08-12 NOTE — Telephone Encounter (Signed)
Patient has been scheduled with MR on 6/3. Will close encounter.

## 2019-08-27 DIAGNOSIS — E663 Overweight: Secondary | ICD-10-CM | POA: Diagnosis not present

## 2019-08-27 DIAGNOSIS — R9389 Abnormal findings on diagnostic imaging of other specified body structures: Secondary | ICD-10-CM | POA: Diagnosis not present

## 2019-08-27 DIAGNOSIS — Z6825 Body mass index (BMI) 25.0-25.9, adult: Secondary | ICD-10-CM | POA: Diagnosis not present

## 2019-08-27 DIAGNOSIS — M0579 Rheumatoid arthritis with rheumatoid factor of multiple sites without organ or systems involvement: Secondary | ICD-10-CM | POA: Diagnosis not present

## 2019-08-29 ENCOUNTER — Telehealth: Payer: Self-pay | Admitting: Internal Medicine

## 2019-08-29 ENCOUNTER — Encounter: Payer: Self-pay | Admitting: Internal Medicine

## 2019-08-29 ENCOUNTER — Other Ambulatory Visit: Payer: Self-pay

## 2019-08-29 ENCOUNTER — Ambulatory Visit: Payer: Medicare Other | Admitting: Internal Medicine

## 2019-08-29 VITALS — BP 100/60 | HR 89 | Temp 98.1°F | Ht 64.0 in | Wt 145.4 lb

## 2019-08-29 DIAGNOSIS — Z8739 Personal history of other diseases of the musculoskeletal system and connective tissue: Secondary | ICD-10-CM | POA: Diagnosis not present

## 2019-08-29 DIAGNOSIS — R918 Other nonspecific abnormal finding of lung field: Secondary | ICD-10-CM | POA: Diagnosis not present

## 2019-08-29 NOTE — Progress Notes (Signed)
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 Pulmonary Consult    Consult: Brandy Townsend, intermittent chest tightness, no wheezing or cough   wheezng started roughly the same year she quit smoking assoc with wt gain x 35lb wt gain typically better p prednisone and better since spring 2017  On asthmanex 2 each am with rare saba needed but then Feb 10 2016 chest tightness and did not try saba > ER 02/21/16 with w/u neg w/u except for a/f levels both max sinuses> Rx Rosen= prednsione > back to baseline at time of ov with no cough or sob or chest tightness and  Not limited by breathing from desired activities including treadmill at 3.5 mph flat x 1.5 miles  Other than assoc of flares with cold/sinus infections >> No obvious patterns in day to day or daytime variability or assoc excess/ purulent sputum or mucus plugs or hemoptysis or cp or chest tightness, subjective wheeze or overt   hb symptoms. No unusual exp hx or h/o childhood pna/ asthma or knowledge of premature birth. 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 Follow-up    Pt states that her breathing has been doing well since last OV.  No new complaints. 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 Follow-up    Breathing is doing well. She has not had to use her albuterol inhaler at all and has only been using the symbicort 1-2 puffs every am.   symb 80 1-2 each am at most never using the 2 bid max  Not limited by breathing from desired activities   No cough   No obvious day to day or  daytime variability or assoc excess/ purulent sputum or mucus plugs or hemoptysis or cp or chest tightness, subjective wheeze or overt sinus or hb symptoms. No unusual exp hx or h/o childhood pna/ asthma or knowledge of premature birth.  Sleeping ok flat without nocturnal  or early am exacerbation  of respiratory  c/o's or need for noct saba. Also denies any obvious fluctuation of symptoms with weather or environmental changes or other aggravating or alleviating factors except as outlined above   Current Allergies, Complete Past Medical History, Past Surgical History, Family History, and Social History were reviewed in [employer] record.  ROS  The following are not active complaints unless bolded Hoarseness, sore throat, dysphagia, dental problems, itching, sneezing,  nasal congestion or discharge of excess mucus or purulent secretions,  ear ache,   fever, chills, sweats, unintended wt loss or wt gain, classically pleuritic or exertional cp,  orthopnea pnd or leg swelling, presyncope, palpitations, abdominal pain, anorexia, nausea, vomiting, diarrhea  or change in bowel habits or change in bladder habits, change in stools or change in urine, dysuria, hematuria,  rash, arthralgias, visual complaints, headache, numbness, weakness or ataxia or problems with walking or coordination,  change in mood/affect or memory.       OV 08/29/2019  Subjective:  Patient ID: Brandy Townsend, female , DOB: 05/11/1950 , age 69 y.o. , MRN: [id number] , ADDRESS: [place] [id number]   08/29/2019 -   Chief Complaint  Patient presents with  . Consult    Abnormal CT, hx of asthma. hx of low oxygen level and cough.    Transfer of care from Brandy Townsend to Brandy Townsend.  HPI Brandy Townsend 69 y.o. -referred for possible abnormalities in the CT scan of the chest in April 2021 in the setting of rheumatoid arthritis 2018 (RF + and CCP > 250 per Brandy Townsend over phone) and being on [place] and longstanding history of allergic asthma on dulera. 20 pack former smoking   69 year old female.  Former Radio producer.  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  She reports a many year many decade history of allergic asthma which flares up in the fall.  Between episodes she is not on any maintenance treatment.  In the fall when it flares up her previous primary care physician Brandy Townsend retired] would give her antibiotics and prednisone one course and this would help it.  However in 2020 when the flareup happened she could not get access to the prednisone and antibiotics because the primary care physician retired and in the setting of [place] it was televisit.  By the end of 2020 she ended up with 2 rounds of antibiotics and prednisone that helped clear things up.  In the interim in the last 4 years she has had a diagnosis of  rheumatoid arthritis.  She was on methotrexate but had raised liver function test.  This seemed to help.  She failed Plaquenil and then was started on leflunomide which she is on currently.   In 2018 she did have spirometry and DLCO on pulmonary function testing that shows hyperinflation and air trapping with reduced DLCO but also normal spirometry.  In the backdrop of this in January 2020 when she underwent her first Covid vaccine.  Then in early February 2020 when she had a second Covid vaccine.  24 hours later she got hypotensive tachycardic and also hypoxemic and ended up in the ER.  I reviewed the  ER notes.  She was observed in the ER for whole day and her symptoms and signs resolved and the hypoxemia also resolved.  She was discharged with a prednisone course.  She says subsequent to that she was short of breath and fatigue for 2 weeks and this also resolved.  She did see cardiology in February 2021.  She was reassured by a normal stress test.  After that she switched primary care physicians to Dr. Reynold Bowen.  She is now established with a new rheumatologist at [facility] and is seen Brandy Townsend the 32 assistant for the first time on August 27, 2019 2 days ago.  She also had a thyroid biopsy which was nondiagnostic.  On the rheumatology visit 2 days ago it was felt her rheumatoid arthritis was active.  Blood has been drawn and work-up is in progress.  Through all this primary care physician did do CT chest without contrast.  I personally visualized this.  There is evidence of tree-in-bud that is mild and subtle.  Therefore she is referred here.  However at this point in time she is asymptomatic from a respiratory standpoint no wheezing no orthopnea no proximal nocturnal dyspnea no dyspnea on exertion no cough.  However in terms of rheumatoid arthritis there is plans to increase her immunomodulation. She has been started on low dose pred now by Brandy Townsend. Currently labs  pending are CRP, Quant Gold and Hep Panel   D/w Brandy Townsend later -> she is considering adding DMARD -discussed and she is supportive of getting bronch and rule out opportunistic infection  ROS - per HPI  IMPRESSION: CT chest visualized 1. Very mild, stable tree-in-bud appearing opacities along the anterolateral aspect of the bilateral upper lobes. Sequelae associated with atypical infection cannot be excluded. 2. Predominant stable thyroid goiter.  Aortic Atherosclerosis (ICD10-I70.0).   Electronically Signed   By: Virgina Norfolk M.D.   On: 07/22/2019 22:34   has a past medical history of Asthma, Graves disease, Hypertension, Osteoporosis, Rheumatoid arthritis ([place]), Thyroid disease, and Vertigo.   reports that she quit smoking about 10 years ago. Her smoking use included cigarettes. She has a 20.00 pack-year smoking history. She has never used smokeless tobacco.  History reviewed. No pertinent surgical history.  Allergies  Allergen Reactions  . Macrobid [employer] Macro] Other (See Comments)    Lowered potassium, caused aches and pains    Immunization History  Administered Date(s) Administered  . Influenza Whole 01/04/2017  . Influenza, High Dose Seasonal PF 12/27/2018  . Influenza-Unspecified 12/26/2013, 12/27/2014, 12/27/2015  . PFIZER SARS-COV-2 Vaccination 04/17/2019, 05/06/2019  . Pneumococcal Conjugate-13 04/28/2014  . Zoster 04/01/2019    History reviewed. No pertinent family history.   Current Outpatient Medications:  .  acetaminophen (TYLENOL) 325 MG tablet, Take 325 mg by mouth every 6 (six) hours as needed for moderate pain., Disp: , Rfl:  .  cetirizine (ZYRTEC) 10 MG tablet, Take 10 mg by mouth at bedtime as needed for allergies. , Disp: , Rfl:  .  cholecalciferol (VITAMIN D3) 25 MCG (1000 UNIT) tablet, Take 1,000 Units by mouth daily., Disp: , Rfl:  .  ibuprofen (ADVIL) 200 MG tablet, Take 200 mg by mouth every 6 (six) hours as needed  for moderate pain., Disp: , Rfl:  .  KLOR-CON 10 10 MEQ tablet, Take 10 mEq by mouth daily., Disp: , Rfl: 0 .  leflunomide (ARAVA) 20 MG tablet, Take 20 mg by mouth daily., Disp: , Rfl:  .  levothyroxine (SYNTHROID) 50 MCG tablet, Take 50 mcg by mouth daily. , Disp: , Rfl:  .  mometasone-formoterol (DULERA) 200-5 MCG/ACT AERO, Inhale 2 puffs into the lungs in the morning and at bedtime. (Patient taking differently: Inhale 1 puff into the lungs in the morning and at bedtime. ), Disp: 13 g, Rfl: 3 .  torsemide (DEMADEX) 20 MG tablet, Take 1 tablet (20 mg total) by mouth daily., Disp: 90 tablet, Rfl: 3 .  verapamil (CALAN-SR) 240 MG CR tablet, Take 1 tablet (240 mg total) by mouth daily., Disp: 90 tablet, Rfl: 3 .  predniSONE (DELTASONE) 10 MG tablet, prednisone 10 mg tablet, Disp: , Rfl:       Objective:   Vitals:   08/29/19 1044  BP: 100/60  Pulse: 89  Temp: 98.1 F (36.7 C)  TempSrc: Oral  SpO2: 95%  Weight: 145 lb 6.4 oz (66 kg)  Height: 5\' 4"  (1.626 m)    Estimated body mass index is 24.96 kg/m as calculated from the following:   Height as of this encounter: 5\' 4"  (1.626 m).   Weight as of this encounter: 145 lb 6.4 oz (66 kg).  @WEIGHTCHANGE @  [employer]   08/29/19 1044  Weight: 145 lb 6.4 oz (66 kg)     Physical Exam  General Appearance:    Alert, cooperative, no distress, appears stated age - yes , Deconditioned looking - no , OBESE  - no, Sitting on Wheelchair -  no  Head:    Normocephalic, without obvious abnormality, atraumatic  Eyes:    PERRL, conjunctiva/corneas clear,  Ears:    Normal TM's and external ear canals, both ears  Nose:   Nares normal, septum midline, mucosa normal, no drainage    or sinus tenderness. OXYGEN ON  - no . Patient is @ no   Throat:   Lips, mucosa, and tongue normal; teeth and gums normal. Cyanosis on lips - no  Neck:   Supple, symmetrical, trachea midline, no adenopathy;    thyroid:  no enlargement/tenderness/nodules; no carotid    bruit or JVD  Back:     Symmetric, no curvature, ROM normal, no CVA tenderness  Lungs:     Distress - no , Wheeze no, Barrell Chest - no, Purse lip breathing - no, Crackles - no   Chest Wall:    No tenderness or deformity.    Heart:    Regular rate and rhythm, S1 and S2 normal, no rub   or gallop, Murmur - no  Breast Exam:    NOT DONE  Abdomen:     Soft, non-tender, bowel sounds active all four quadrants,    no masses, no organomegaly. Visceral obesity - no  Genitalia:   NOT DONE  Rectal:   NOT DONE  Extremities:   Extremities - normal, Has Cane - no, Clubbing - no, Edema - no  Pulses:   2+ and symmetric all extremities  Skin:   Stigmata of Connective Tissue Disease - not really  Lymph nodes:   Cervical, supraclavicular, and axillary nodes normal  Psychiatric:  Neurologic:   Pleasant - yes, Anxious - no, Flat affect - no  CAm-ICU - neg, Alert and Oriented x 3 - yes, Moves all 4s - yes, Speech - normal, Cognition - intact           Assessment:       ICD-10-CM   1. History of rheumatoid arthritis  Z87.39   2. Pulmonary infiltrates  R91.8  Plan:     Patient Instructions     ICD-10-CM   1. History of rheumatoid arthritis  Z87.39   2. Pulmonary infiltrates  R91.8     Plan  - consider bronchoscopy with lavage ahead of increased immune suppression being planned   - I will d/w ERin Pearline Cables firist  - week of June 14 good week for bronch at [facility]  - In July/August 2021 -  - do full PFT  - do exhaled nitric oxide testing  Followup  - August 2021 after PFT Testing  -r eturn sooner if needed  - 30 min visit    ( Level 05 visit:  New 60-74 min visit type: on-site physical face to visit  in total care time and counseling or/and coordination of care by this undersigned MD - Dr Brand Males. This includes one or more of the following on this same day 08/29/2019: pre-charting, chart review, note writing, documentation discussion of test results, diagnostic or  treatment recommendations, prognosis, risks and benefits of management options, instructions, education, compliance or risk-factor reduction. It excludes time spent by the [facility] or office staff in the care of the patient. Actual time 56 min)   SIGNATURE    Dr. Brand Males, M.D., F.C.C.P,  Pulmonary and Critical Care Medicine Staff Physician, [place] Director - Interstitial Lung Disease  Program  Pulmonary [facility] at [place], [place], [address]  Pager: [phone], If no answer or between  15:00h - 7:00h: call [phone] Telephone: [phone]  11:28 AM 08/29/2019

## 2019-08-29 NOTE — Patient Instructions (Addendum)
ICD-10-CM   1. History of rheumatoid arthritis  Z87.39   2. Pulmonary infiltrates  R91.8     Plan  - consider bronchoscopy with lavage ahead of increased immune suppression being planned   - I will d/w ERin Pearline Cables firist  - week of June 14 good week for bronch at [facility]  - In July/August 2021 -  - do full PFT  - do exhaled nitric oxide testing  Followup  - August 2021 after PFT Testing  -r eturn sooner if needed  - 30 min visit

## 2019-08-29 NOTE — Telephone Encounter (Signed)
   Please schedule the following:  Diagnosis: Rheumatoid arthritis history of asthma and tree-in-bud pulmonary infiltrates Procedure: Video bronchocoopy, flexible bronchoscopy with B bronchoalveolar lavage.  No planned biopsy Envisia Classifer Transbronchial biopsy: /N)  Anesthesia: moderate sedtion Do you need Fluro? no Priority: Week of September 09, 2019 when I am at [facility] rotation Date: Any day in the same week as long as there are no 2 bronchoscopies on the same day  PM okay Location: I prefer [facility] because of my rotation but if [place] long is what they can do that is fine Does patient have OSA?  No DM?  No or Latex allergy?  No Medication Restriction: If any aspirin stopped for 3 days prior to procedure.  If any Plavix stopped 7 days prior to procedure.  If any Eliquis or Xarelto stop 3 days before procedure Anticoagulate/Antiplatelet: Not required Pre-op Labs Ordered: Imaging request: Not needed    Allergy History:  Allergies  Allergen Reactions  . Macrobid [employer] Macro] Other (See Comments)    Lowered potassium, caused aches and pains     Please coordinate Pre-op COVID Testing   Please let Dr [name] know via reply phone message on Epic  Thank you   Current Outpatient Medications:  .  acetaminophen (TYLENOL) 325 MG tablet, Take 325 mg by mouth every 6 (six) hours as needed for moderate pain., Disp: , Rfl:  .  cetirizine (ZYRTEC) 10 MG tablet, Take 10 mg by mouth at bedtime as needed for allergies. , Disp: , Rfl:  .  cholecalciferol (VITAMIN D3) 25 MCG (1000 UNIT) tablet, Take 1,000 Units by mouth daily., Disp: , Rfl:  .  ibuprofen (ADVIL) 200 MG tablet, Take 200 mg by mouth every 6 (six) hours as needed for moderate pain., Disp: , Rfl:  .  KLOR-CON 10 10 MEQ tablet, Take 10 mEq by mouth daily., Disp: , Rfl: 0 .  leflunomide (ARAVA) 20 MG tablet, Take 20 mg by mouth daily., Disp: , Rfl:  .  levothyroxine (SYNTHROID) 50 MCG  tablet, Take 50 mcg by mouth daily. , Disp: , Rfl:  .  mometasone-formoterol (DULERA) 200-5 MCG/ACT AERO, Inhale 2 puffs into the lungs in the morning and at bedtime. (Patient taking differently: Inhale 1 puff into the lungs in the morning and at bedtime. ), Disp: 13 g, Rfl: 3 .  predniSONE (DELTASONE) 10 MG tablet, prednisone 10 mg tablet, Disp: , Rfl:  .  torsemide (DEMADEX) 20 MG tablet, Take 1 tablet (20 mg total) by mouth daily., Disp: 90 tablet, Rfl: 3 .  verapamil (CALAN-SR) 240 MG CR tablet, Take 1 tablet (240 mg total) by mouth daily., Disp: 90 tablet, Rfl: 3   SIGNATURE    Dr. Brand Males, M.D., F.C.C.P,  Pulmonary and Critical Care Medicine Staff Physician, [place] Director - Interstitial Lung Disease  Program  Pulmonary [facility] at [place], [place], [address]  Pager: [phone], If no answer or between  15:00h - 7:00h: call [phone] Telephone: [phone]  12:59 PM 08/29/2019

## 2019-08-29 NOTE — H&P (View-Only) (Signed)
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 Pulmonary Consult    Consult: Dr. Wilson Singer, intermittent chest tightness, no wheezing or cough   wheezng started roughly the same year she quit smoking assoc with wt gain x 35lb wt gain typically better p prednisone and better since spring 2017  On asthmanex 2 each am with rare saba needed but then Feb 10 2016 chest tightness and did not try saba > ER 02/21/16 with w/u neg w/u except for a/f levels both max sinuses> Rx Rosen= prednsione > back to baseline at time of ov with no cough or sob or chest tightness and  Not limited by breathing from desired activities including treadmill at 3.5 mph flat x 1.5 miles  Other than assoc of flares with cold/sinus infections >> No obvious patterns in day to day or daytime variability or assoc excess/ purulent sputum or mucus plugs or hemoptysis or cp or chest tightness, subjective wheeze or overt   hb symptoms. No unusual exp hx or h/o childhood pna/ asthma or knowledge of premature birth. 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 Follow-up    Pt states that her breathing has been doing well since last OV.  No new complaints. 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 Follow-up    Breathing is doing well. She has not had to use her albuterol inhaler at all and has only been using the symbicort 1-2 puffs every am.   symb 80 1-2 each am at most never using the 2 bid max  Not limited by breathing from desired activities   No cough   No obvious day to day or  daytime variability or assoc excess/ purulent sputum or mucus plugs or hemoptysis or cp or chest tightness, subjective wheeze or overt sinus or hb symptoms. No unusual exp hx or h/o childhood pna/ asthma or knowledge of premature birth.  Sleeping ok flat without nocturnal  or early am exacerbation  of respiratory  c/o's or need for noct saba. Also denies any obvious fluctuation of symptoms with weather or environmental changes or other aggravating or alleviating factors except as outlined above   Current Allergies, Complete Past Medical History, Past Surgical History, Family History, and Social History were reviewed in [employer] record.  ROS  The following are not active complaints unless bolded Hoarseness, sore throat, dysphagia, dental problems, itching, sneezing,  nasal congestion or discharge of excess mucus or purulent secretions,  ear ache,   fever, chills, sweats, unintended wt loss or wt gain, classically pleuritic or exertional cp,  orthopnea pnd or leg swelling, presyncope, palpitations, abdominal pain, anorexia, nausea, vomiting, diarrhea  or change in bowel habits or change in bladder habits, change in stools or change in urine, dysuria, hematuria,  rash, arthralgias, visual complaints, headache, numbness, weakness or ataxia or problems with walking or coordination,  change in mood/affect or memory.       OV 08/29/2019  Subjective:  Patient ID: Brandy Townsend, female , DOB: 05/11/1950 , age 26 y.o. , MRN: [id number] , ADDRESS: [place] [address]   08/29/2019 -   Chief Complaint  Patient presents with  . Consult    Abnormal CT, hx of asthma. hx of low oxygen level and cough.    Transfer of care from Dr. [name] to Dr. [name].  HPI Brandy Townsend 69 y.o. -referred for possible abnormalities in the CT scan of the chest in April 2021 in the setting of rheumatoid arthritis 2018 (RF + and CCP > 250 per [name] over phone) and being on [place] and longstanding history of allergic asthma on dulera. 20 pack former smoking   69 year old female.  Former Radio producer.  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  She reports a many year many decade history of allergic asthma which flares up in the fall.  Between episodes she is not on any maintenance treatment.  In the fall when it flares up her previous primary care physician Dr. [name] retired] would give her antibiotics and prednisone one course and this would help it.  However in 2020 when the flareup happened she could not get access to the prednisone and antibiotics because the primary care physician retired and in the setting of [place] it was televisit.  By the end of 2020 she ended up with 2 rounds of antibiotics and prednisone that helped clear things up.  In the interim in the last 4 years she has had a diagnosis of  rheumatoid arthritis.  She was on methotrexate but had raised liver function test.  This seemed to help.  She failed Plaquenil and then was started on leflunomide which she is on currently.   In 2018 she did have spirometry and DLCO on pulmonary function testing that shows hyperinflation and air trapping with reduced DLCO but also normal spirometry.  In the backdrop of this in January 2020 when she underwent her first Covid vaccine.  Then in early February 2020 when she had a second Covid vaccine.  24 hours later she got hypotensive tachycardic and also hypoxemic and ended up in the ER.  I reviewed the  ER notes.  She was observed in the ER for whole day and her symptoms and signs resolved and the hypoxemia also resolved.  She was discharged with a prednisone course.  She says subsequent to that she was short of breath and fatigue for 2 weeks and this also resolved.  She did see cardiology in February 2021.  She was reassured by a normal stress test.  After that she switched primary care physicians to Dr. Reynold Bowen.  She is now established with a new rheumatologist at [facility] and is seen [name] the 48 assistant for the first time on August 27, 2019 2 days ago.  She also had a thyroid biopsy which was nondiagnostic.  On the rheumatology visit 2 days ago it was felt her rheumatoid arthritis was active.  Blood has been drawn and work-up is in progress.  Through all this primary care physician did do CT chest without contrast.  I personally visualized this.  There is evidence of tree-in-bud that is mild and subtle.  Therefore she is referred here.  However at this point in time she is asymptomatic from a respiratory standpoint no wheezing no orthopnea no proximal nocturnal dyspnea no dyspnea on exertion no cough.  However in terms of rheumatoid arthritis there is plans to increase her immunomodulation. She has been started on low dose pred now by [name]. Currently labs  pending are CRP, Quant Gold and Hep Panel   D/w [name] later -> she is considering adding DMARD -discussed and she is supportive of getting bronch and rule out opportunistic infection  ROS - per HPI  IMPRESSION: CT chest visualized 1. Very mild, stable tree-in-bud appearing opacities along the anterolateral aspect of the bilateral upper lobes. Sequelae associated with atypical infection cannot be excluded. 2. Predominant stable thyroid goiter.  Aortic Atherosclerosis (ICD10-I70.0).   Electronically Signed   By: Virgina Norfolk M.D.   On: 07/22/2019 22:34   has a past medical history of Asthma, Graves disease, Hypertension, Osteoporosis, Rheumatoid arthritis ([place]), Thyroid disease, and Vertigo.   reports that she quit smoking about 10 years ago. Her smoking use included cigarettes. She has a 20.00 pack-year smoking history. She has never used smokeless tobacco.  History reviewed. No pertinent surgical history.  Allergies  Allergen Reactions  . Macrobid [employer] Macro] Other (See Comments)    Lowered potassium, caused aches and pains    Immunization History  Administered Date(s) Administered  . Influenza Whole 01/04/2017  . Influenza, High Dose Seasonal PF 12/27/2018  . Influenza-Unspecified 12/26/2013, 12/27/2014, 12/27/2015  . PFIZER SARS-COV-2 Vaccination 04/17/2019, 05/06/2019  . Pneumococcal Conjugate-13 04/28/2014  . Zoster 04/01/2019    History reviewed. No pertinent family history.   Current Outpatient Medications:  .  acetaminophen (TYLENOL) 325 MG tablet, Take 325 mg by mouth every 6 (six) hours as needed for moderate pain., Disp: , Rfl:  .  cetirizine (ZYRTEC) 10 MG tablet, Take 10 mg by mouth at bedtime as needed for allergies. , Disp: , Rfl:  .  cholecalciferol (VITAMIN D3) 25 MCG (1000 UNIT) tablet, Take 1,000 Units by mouth daily., Disp: , Rfl:  .  ibuprofen (ADVIL) 200 MG tablet, Take 200 mg by mouth every 6 (six) hours as needed  for moderate pain., Disp: , Rfl:  .  KLOR-CON 10 10 MEQ tablet, Take 10 mEq by mouth daily., Disp: , Rfl: 0 .  leflunomide (ARAVA) 20 MG tablet, Take 20 mg by mouth daily., Disp: , Rfl:  .  levothyroxine (SYNTHROID) 50 MCG tablet, Take 50 mcg by mouth daily. , Disp: , Rfl:  .  mometasone-formoterol (DULERA) 200-5 MCG/ACT AERO, Inhale 2 puffs into the lungs in the morning and at bedtime. (Patient taking differently: Inhale 1 puff into the lungs in the morning and at bedtime. ), Disp: 13 g, Rfl: 3 .  torsemide (DEMADEX) 20 MG tablet, Take 1 tablet (20 mg total) by mouth daily., Disp: 90 tablet, Rfl: 3 .  verapamil (CALAN-SR) 240 MG CR tablet, Take 1 tablet (240 mg total) by mouth daily., Disp: 90 tablet, Rfl: 3 .  predniSONE (DELTASONE) 10 MG tablet, prednisone 10 mg tablet, Disp: , Rfl:       Objective:   Vitals:   08/29/19 1044  BP: 100/60  Pulse: 89  Temp: 98.1 F (36.7 C)  TempSrc: Oral  SpO2: 95%  Weight: 145 lb 6.4 oz (66 kg)  Height: 5\' 4"  (1.626 m)    Estimated body mass index is 24.96 kg/m as calculated from the following:   Height as of this encounter: 5\' 4"  (1.626 m).   Weight as of this encounter: 145 lb 6.4 oz (66 kg).  @WEIGHTCHANGE @  [employer]   08/29/19 1044  Weight: 145 lb 6.4 oz (66 kg)     Physical Exam  General Appearance:    Alert, cooperative, no distress, appears stated age - yes , Deconditioned looking - no , OBESE  - no, Sitting on Wheelchair -  no  Head:    Normocephalic, without obvious abnormality, atraumatic  Eyes:    PERRL, conjunctiva/corneas clear,  Ears:    Normal TM's and external ear canals, both ears  Nose:   Nares normal, septum midline, mucosa normal, no drainage    or sinus tenderness. OXYGEN ON  - no . Patient is @ no   Throat:   Lips, mucosa, and tongue normal; teeth and gums normal. Cyanosis on lips - no  Neck:   Supple, symmetrical, trachea midline, no adenopathy;    thyroid:  no enlargement/tenderness/nodules; no carotid    bruit or JVD  Back:     Symmetric, no curvature, ROM normal, no CVA tenderness  Lungs:     Distress - no , Wheeze no, Barrell Chest - no, Purse lip breathing - no, Crackles - no   Chest Wall:    No tenderness or deformity.    Heart:    Regular rate and rhythm, S1 and S2 normal, no rub   or gallop, Murmur - no  Breast Exam:    NOT DONE  Abdomen:     Soft, non-tender, bowel sounds active all four quadrants,    no masses, no organomegaly. Visceral obesity - no  Genitalia:   NOT DONE  Rectal:   NOT DONE  Extremities:   Extremities - normal, Has Cane - no, Clubbing - no, Edema - no  Pulses:   2+ and symmetric all extremities  Skin:   Stigmata of Connective Tissue Disease - not really  Lymph nodes:   Cervical, supraclavicular, and axillary nodes normal  Psychiatric:  Neurologic:   Pleasant - yes, Anxious - no, Flat affect - no  CAm-ICU - neg, Alert and Oriented x 3 - yes, Moves all 4s - yes, Speech - normal, Cognition - intact           Assessment:       ICD-10-CM   1. History of rheumatoid arthritis  Z87.39   2. Pulmonary infiltrates  R91.8  Plan:     Patient Instructions     ICD-10-CM   1. History of rheumatoid arthritis  Z87.39   2. Pulmonary infiltrates  R91.8     Plan  - consider bronchoscopy with lavage ahead of increased immune suppression being planned   - I will d/w ERin Pearline Cables firist  - week of June 14 good week for bronch at [facility]  - In July/August 2021 -  - do full PFT  - do exhaled nitric oxide testing  Followup  - August 2021 after PFT Testing  -r eturn sooner if needed  - 30 min visit    ( Level 05 visit:  New 60-74 min visit type: on-site physical face to visit  in total care time and counseling or/and coordination of care by this undersigned MD - Dr Brand Males. This includes one or more of the following on this same day 08/29/2019: pre-charting, chart review, note writing, documentation discussion of test results, diagnostic or  treatment recommendations, prognosis, risks and benefits of management options, instructions, education, compliance or risk-factor reduction. It excludes time spent by the [place] or office staff in the care of the patient. Actual time 38 min)   SIGNATURE    Dr. Brand Males, M.D., F.C.C.P,  Pulmonary and Critical Care Medicine Staff Physician, [place] Director - Interstitial Lung Disease  Program  [facility] [place], [place], [address]  Pager: [phone], If no answer or between  15:00h - 7:00h: call [phone] Telephone: [phone]  11:28 AM 08/29/2019

## 2019-09-03 ENCOUNTER — Telehealth: Payer: Self-pay | Admitting: Internal Medicine

## 2019-09-03 NOTE — Telephone Encounter (Signed)
Called and spoke with patient. Let them know their Bronch is scheduled for 09/12/19 with Dr. [name] at [place] [facility].  Patient was instructed to arrive at hospital at 0700. They were instructed to bring someone with them as they will not be able to drive home from procedure. Patient instructed not to have anything to eat or drink after midnight. Patient needs to hold their blood thinner NONE hours / days prior to procedure.   Patient's covid screening is scheduled at [facility] for 09/09/19 at 1000.  Patient voiced understanding, nothing further needed  Routing to MR as [facility]

## 2019-09-03 NOTE — Telephone Encounter (Signed)
Spoke with patient. She several questions about her bronch that is scheduled for 6/17. She stated that when Lauren called her earlier today but she was driving and could not write anything down. I re-read all of the information from Lauren's message from earlier. She verbalized understanding and requested to have the information sent to her via MyChart message.   She did have one question about her prednisone. She wants to know if she can still take the prednisone the day of the procedure. I did advise her to not take NSAIDs the day of the procedure.   MR, please advise.

## 2019-09-05 NOTE — Telephone Encounter (Signed)
Patient called and instruction for medications provided. Patient verbalized understanding.

## 2019-09-05 NOTE — Telephone Encounter (Signed)
Ok to take prednisone and meds (other than blood thinners whic she  Is not) on day of and prior to procedure

## 2019-09-09 ENCOUNTER — Other Ambulatory Visit (HOSPITAL_COMMUNITY)
Admission: RE | Admit: 2019-09-09 | Discharge: 2019-09-09 | Disposition: A | Discharge status: Home / Self Care | Payer: Medicare Other | Source: Ambulatory Visit | Attending: Internal Medicine | Admitting: Internal Medicine

## 2019-09-09 DIAGNOSIS — Z01812 Encounter for preprocedural laboratory examination: Secondary | ICD-10-CM | POA: Insufficient documentation

## 2019-09-09 DIAGNOSIS — Z20822 Contact with and (suspected) exposure to covid-19: Secondary | ICD-10-CM | POA: Insufficient documentation

## 2019-09-09 LAB — SARS CORONAVIRUS 2 (TAT 6-24 HRS): SARS Coronavirus 2: NEGATIVE

## 2019-09-12 ENCOUNTER — Telehealth: Payer: Self-pay | Admitting: Internal Medicine

## 2019-09-12 ENCOUNTER — Encounter (HOSPITAL_COMMUNITY): Payer: Self-pay | Admitting: Internal Medicine

## 2019-09-12 ENCOUNTER — Encounter (HOSPITAL_COMMUNITY)
Admission: RE | Disposition: A | Discharge status: Home / Self Care | Payer: Self-pay | Source: Home / Self Care | Attending: Internal Medicine

## 2019-09-12 ENCOUNTER — Other Ambulatory Visit: Payer: Self-pay

## 2019-09-12 ENCOUNTER — Ambulatory Visit (HOSPITAL_COMMUNITY)
Admission: RE | Admit: 2019-09-12 | Discharge: 2019-09-12 | Disposition: A | Discharge status: Home / Self Care | Payer: Medicare Other | Attending: Internal Medicine | Admitting: Internal Medicine

## 2019-09-12 DIAGNOSIS — Z79899 Other long term (current) drug therapy: Secondary | ICD-10-CM | POA: Diagnosis not present

## 2019-09-12 DIAGNOSIS — R918 Other nonspecific abnormal finding of lung field: Secondary | ICD-10-CM | POA: Insufficient documentation

## 2019-09-12 DIAGNOSIS — M069 Rheumatoid arthritis, unspecified: Secondary | ICD-10-CM | POA: Diagnosis not present

## 2019-09-12 DIAGNOSIS — Z79890 Hormone replacement therapy (postmenopausal): Secondary | ICD-10-CM | POA: Diagnosis not present

## 2019-09-12 DIAGNOSIS — E0500 Thyrotoxicosis with diffuse goiter without thyrotoxic crisis or storm: Secondary | ICD-10-CM | POA: Insufficient documentation

## 2019-09-12 DIAGNOSIS — I10 Essential (primary) hypertension: Secondary | ICD-10-CM | POA: Insufficient documentation

## 2019-09-12 DIAGNOSIS — Z9109 Other allergy status, other than to drugs and biological substances: Secondary | ICD-10-CM | POA: Insufficient documentation

## 2019-09-12 DIAGNOSIS — Z7951 Long term (current) use of inhaled steroids: Secondary | ICD-10-CM | POA: Diagnosis not present

## 2019-09-12 DIAGNOSIS — Z87891 Personal history of nicotine dependence: Secondary | ICD-10-CM | POA: Diagnosis not present

## 2019-09-12 DIAGNOSIS — J45909 Unspecified asthma, uncomplicated: Secondary | ICD-10-CM | POA: Diagnosis not present

## 2019-09-12 HISTORY — PX: VIDEO BRONCHOSCOPY: SHX5072

## 2019-09-12 HISTORY — PX: BRONCHIAL WASHINGS: SHX5105

## 2019-09-12 LAB — BODY FLUID CELL COUNT WITH DIFFERENTIAL
Eos, Fluid: 2 %
Lymphs, Fluid: 4 %
Monocyte-Macrophage-Serous Fluid: 38 % — ABNORMAL LOW (ref 50–90)
Neutrophil Count, Fluid: 56 % — ABNORMAL HIGH (ref 0–25)
Total Nucleated Cell Count, Fluid: 15 cu mm (ref 0–1000)

## 2019-09-12 SURGERY — VIDEO BRONCHOSCOPY WITHOUT FLUORO
Anesthesia: Moderate Sedation

## 2019-09-12 MED ORDER — MIDAZOLAM HCL (PF) 5 MG/ML IJ SOLN
INTRAMUSCULAR | Status: AC
  Filled 2019-09-12: qty 2

## 2019-09-12 MED ORDER — LIDOCAINE HCL URETHRAL/MUCOSAL 2 % EX GEL
CUTANEOUS | Status: DC | PRN
  Administered 2019-09-12: 1

## 2019-09-12 MED ORDER — FENTANYL CITRATE (PF) 100 MCG/2ML IJ SOLN
INTRAMUSCULAR | Status: DC | PRN
  Administered 2019-09-12 (×2): 50 ug via INTRAVENOUS

## 2019-09-12 MED ORDER — PHENYLEPHRINE HCL 0.5 % NA SOLN
NASAL | Status: DC | PRN
  Administered 2019-09-12: 1 [drp] via NASAL

## 2019-09-12 MED ORDER — PHENYLEPHRINE HCL 0.25 % NA SOLN: 1.0000 | Freq: Four times a day (QID) | NASAL | Status: DC | PRN

## 2019-09-12 MED ORDER — LIDOCAINE HCL URETHRAL/MUCOSAL 2 % EX GEL: 1.0000 "application " | Freq: Once | CUTANEOUS | Status: DC

## 2019-09-12 MED ORDER — LIDOCAINE HCL 4 % EX SOLN
Freq: Once | CUTANEOUS | Status: DC
  Filled 2019-09-12: qty 50

## 2019-09-12 MED ORDER — BUTAMBEN-TETRACAINE-BENZOCAINE 2-2-14 % EX AERO: 1.0000 | INHALATION_SPRAY | Freq: Once | CUTANEOUS | Status: DC

## 2019-09-12 MED ORDER — LIDOCAINE HCL 1 % IJ SOLN
INTRAMUSCULAR | Status: DC | PRN
  Administered 2019-09-12: 8 mg

## 2019-09-12 MED ORDER — LIDOCAINE HCL URETHRAL/MUCOSAL 2 % EX GEL
CUTANEOUS | Status: AC
  Filled 2019-09-12: qty 30

## 2019-09-12 MED ORDER — PHENYLEPHRINE HCL 1 % NA SOLN
NASAL | Status: AC
  Filled 2019-09-12: qty 15

## 2019-09-12 MED ORDER — FENTANYL CITRATE (PF) 100 MCG/2ML IJ SOLN
INTRAMUSCULAR | Status: AC
  Filled 2019-09-12: qty 4

## 2019-09-12 MED ORDER — MIDAZOLAM HCL (PF) 10 MG/2ML IJ SOLN
INTRAMUSCULAR | Status: DC | PRN
  Administered 2019-09-12 (×2): 2 mg via INTRAVENOUS

## 2019-09-12 NOTE — Telephone Encounter (Signed)
pls give followup telephone visit or face to face (latter preferred) - 15 min to dioscuss bronch results  4 weeks approx

## 2019-09-12 NOTE — Op Note (Signed)
Name:  Brandy Townsend MRN:  [id number] DOB:  [birth year]  PROCEDURE NOTE  Procedure(s): Flexible bronchoscopy [phone]) Bronchial alveolar lavage [phone]) of the right upper lobe lateral segment **  Indications:  pulmnonary infiltrates (on steroids for RA), Known RA  Consent:  Procedure, benefits, risks and alternatives discussed.  Questions answered.  Consent obtained.  Anesthesia:  Moderate Sedation  Location: [facility] endoscopy suite  Procedure summary:  Appropriate equipment was assembled.  The patient was brought to the procedure suite room and identified as Brandy Townsend with 1951/02/10  Safety timeout was performed. The patient was placed supine on the  table, airway and moderate sedation administered by this RN  After the appropriate level of moderation was assured, flexible video bronchoscope was lubricated and inserted through the endotracheal tube.  1% Lidocaine were administered through the bronchoscope to augment moderate sedation  Vocal cords moved normallly but ? Tissue around it a bit floppy  Airway examination was ues performed bilaterally to subsegmental level.  Some scattered thick clear secretions were noted, mucosa appeared normal and no endobronchial lesions were identified. Though at carinal rings at 1pm position some prominence was noted  Bronchial alveolar lavage of the RUL lateral segment was performed with 20 mL of normal saline  X 1  number of times discareded and then 24m x 3 for total volume of 120 mL. Total return of 30 mL of fluid, after which the bronchoscope was withdrawn. Fluid was normal grey looking  After hemostasis was assure, the bronchoscope was withdrawn.  The patient was recovered and then  transferred to recovery area  Post-procedure chest x-ray was NOT ordered.  Specimens sent: Bronchial alveolar lavage specimen of the RUL for cell count, microbiology and cytology.  Complications:  No immediate complications were noted.  Hemodynamic  parameters and oxygenation remained stable throughout the procedure.  Estimated blood loss:  NONE  IMPRESSION 1. Normal airway  2. RUL BAL 3. EClabe Sealclassifer sent - NO  Followup Future Appointments  Date Time Provider [place] 12/17/2019  8:15 AM GBurtis Junes NP [facility]   Will set up bronch result followup in 4 weeks but will call with any interim results  Dr. MBrand Males M.D., FHutchings Psychiatric CenterC.P Pulmonary and Critical Care Medicine Staff Physician C[facility]inalPulmonary and Critical Care Pager: [phone] If no answer or between  15:00h - 7:00h: call [phone]  09/12/2019 8:59 AM

## 2019-09-12 NOTE — Interval H&P Note (Signed)
The history is less than 33 days old. No change. She is NPO. No new complaints  Exam  - non focal - AxO3 - CTA bilaterally - normal heart sounds  - abd soft  - no cyanosis, no clubbing, no edema  CT  - Bilateral tree in bud UL infiltreates laterally  A RA Plans to start immune suppression Bilateral UL infiltrates on CT - pattern not c/w UIP  P Bronch with bal +/- endobronchial biopsy SLB if needed based on bx/results Ok for immune suppression based on culture results   Risks of pneumothorax, hemothorax, sedation/anesthesia complications such as cardiac or respiratory arrest or hypotension, stroke and bleeding all explained. Benefits of diagnosis but limitations of non-diagnosis also explained. Patient verbalized understanding and wished to proceed.     .mrsiog

## 2019-09-12 NOTE — Discharge Instructions (Signed)
Please have someone to drive you home Please be careful with activities for next 24 hours You can eat 2-4 hours after getting home provided you are fully alert, able to cough, and are not nauseated or vomiting and     feel well You are expected to have low grade fever or cough some amount of blood for next 24-48 hours; if this worsens call us   IF you are very short of breath or coughing blood or chest pain or not feeling well, call us [phone]  anytime or go to emergency room  Please call [phone]  for followup appointment or we will call you and fix one  

## 2019-09-12 NOTE — Telephone Encounter (Signed)
Called and spoke with pt and have scheduled her for an appt with MR 7/22 to discuss results of bronch. Nothing further needed.

## 2019-09-13 LAB — PNEUMOCYSTIS JIROVECI SMEAR BY DFA: Pneumocystis jiroveci Ag: NEGATIVE

## 2019-09-13 LAB — ACID FAST SMEAR (AFB, MYCOBACTERIA): Acid Fast Smear: NEGATIVE

## 2019-09-13 LAB — CYTOLOGY - NON PAP

## 2019-09-14 LAB — CULTURE, RESPIRATORY W GRAM STAIN
Culture: NORMAL
Gram Stain: NONE SEEN

## 2019-09-16 LAB — MTB RIF NAA NON-SPUTUM, W/O CULTURE

## 2019-09-18 DIAGNOSIS — M0579 Rheumatoid arthritis with rheumatoid factor of multiple sites without organ or systems involvement: Secondary | ICD-10-CM | POA: Diagnosis not present

## 2019-09-18 DIAGNOSIS — R9389 Abnormal findings on diagnostic imaging of other specified body structures: Secondary | ICD-10-CM | POA: Diagnosis not present

## 2019-09-18 DIAGNOSIS — M810 Age-related osteoporosis without current pathological fracture: Secondary | ICD-10-CM | POA: Diagnosis not present

## 2019-09-18 DIAGNOSIS — Z79899 Other long term (current) drug therapy: Secondary | ICD-10-CM | POA: Diagnosis not present

## 2019-09-27 ENCOUNTER — Other Ambulatory Visit: Payer: Self-pay | Admitting: Physician Assistant

## 2019-09-27 DIAGNOSIS — M810 Age-related osteoporosis without current pathological fracture: Secondary | ICD-10-CM

## 2019-10-11 LAB — FUNGUS CULTURE WITH STAIN

## 2019-10-11 LAB — FUNGUS CULTURE RESULT

## 2019-10-11 LAB — FUNGAL ORGANISM REFLEX

## 2019-10-17 ENCOUNTER — Ambulatory Visit: Payer: Medicare Other | Admitting: Internal Medicine

## 2019-10-17 ENCOUNTER — Other Ambulatory Visit: Payer: Self-pay

## 2019-10-17 ENCOUNTER — Encounter: Payer: Self-pay | Admitting: Internal Medicine

## 2019-10-17 VITALS — BP 118/60 | HR 68 | Ht 64.0 in | Wt 151.2 lb

## 2019-10-17 DIAGNOSIS — Z8739 Personal history of other diseases of the musculoskeletal system and connective tissue: Secondary | ICD-10-CM | POA: Diagnosis not present

## 2019-10-17 DIAGNOSIS — R918 Other nonspecific abnormal finding of lung field: Secondary | ICD-10-CM

## 2019-10-17 NOTE — Patient Instructions (Addendum)
ICD-10-CM   1. History of rheumatoid arthritis  Z87.39   2. Pulmonary infiltrates  R91.8     Bronchoscopy 09/12/19 did not show any organism   Plan  - d/w [name] -> she will focus on joiint issues with appropriate medication of choice  - In  9-12 months from now  - do full PFT  - do exhaled nitric oxide testing  Followup  -  9-12 months or sooner if needed

## 2019-10-17 NOTE — Progress Notes (Signed)
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 Pulmonary Consult    Consult: Dr. Wilson Singer, intermittent chest tightness, no wheezing or cough   wheezng started roughly the same year she quit smoking assoc with wt gain x 35lb wt gain typically better p prednisone and better since spring 2017  On asthmanex 2 each am with rare saba needed but then Feb 10 2016 chest tightness and did not try saba > ER 02/21/16 with w/u neg w/u except for a/f levels both max sinuses> Rx Rosen= prednsione > back to baseline at time of ov with no cough or sob or chest tightness and  Not limited by breathing from desired activities including treadmill at 3.5 mph flat x 1.5 miles  Other than assoc of flares with cold/sinus infections >> No obvious patterns in day to day or daytime variability or assoc excess/ purulent sputum or mucus plugs or hemoptysis or cp or chest tightness, subjective wheeze or overt   hb symptoms. No unusual exp hx or h/o childhood pna/ asthma or knowledge of premature birth. 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 Follow-up    Pt states that her breathing has been doing well since last OV.  No new complaints. 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 Follow-up    Breathing is doing well. She has not had to use her albuterol inhaler at all and has only been using the symbicort 1-2 puffs every am.   symb 80 1-2 each am at most never using the 2 bid max  Not limited by breathing from desired activities   No cough   No obvious day to day or  daytime variability or assoc excess/ purulent sputum or mucus plugs or hemoptysis or cp or chest tightness, subjective wheeze or overt sinus or hb symptoms. No unusual exp hx or h/o childhood pna/ asthma or knowledge of premature birth.  Sleeping ok flat without nocturnal  or early am exacerbation  of respiratory  c/o's or need for noct saba. Also denies any obvious fluctuation of symptoms with weather or environmental changes or other aggravating or alleviating factors except as outlined above   Current Allergies, Complete Past Medical History, Past Surgical History, Family History, and Social History were reviewed in [employer] record.  ROS  The following are not active complaints unless bolded Hoarseness, sore throat, dysphagia, dental problems, itching, sneezing,  nasal congestion or discharge of excess mucus or purulent secretions,  ear ache,   fever, chills, sweats, unintended wt loss or wt gain, classically pleuritic or exertional cp,  orthopnea pnd or leg swelling, presyncope, palpitations, abdominal pain, anorexia, nausea, vomiting, diarrhea  or change in bowel habits or change in bladder habits, change in stools or change in urine, dysuria, hematuria,  rash, arthralgias, visual complaints, headache, numbness, weakness or ataxia or problems with walking or coordination,  change in mood/affect or memory.       OV 08/29/2019  Subjective:  Patient ID: Brandy Townsend, female , DOB: 05/11/1950 , age 69 y.o. y.o. , MRN: [id number] , ADDRESS: [place] [address]   08/29/2019 -   Chief Complaint  Patient presents with  . Consult    Abnormal CT, hx of asthma. hx of low oxygen level and cough.    Transfer of care from Dr. [name] to Dr. [name].  HPI Brandy Townsend 69 y.o. -referred for possible abnormalities in the CT scan of the chest in April 2021 in the setting of rheumatoid arthritis 2018 (RF + and CCP > 250 per [name] over phone) and being on [place] and longstanding history of allergic asthma on dulera. 20 pack former smoking   69 year old female.  Former Radio producer.  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  She reports a many year many decade history of allergic asthma which flares up in the fall.  Between episodes she is not on any maintenance treatment.  In the fall when it flares up her previous primary care physician Dr. [name] retired] would give her antibiotics and prednisone one course and this would help it.  However in 2020 when the flareup happened she could not get access to the prednisone and antibiotics because the primary care physician retired and in the setting of [place] it was televisit.  By the end of 2020 she ended up with 2 rounds of antibiotics and prednisone that helped clear things up.  In the interim in the last 4 years she has had a diagnosis of  rheumatoid arthritis.  She was on methotrexate but had raised liver function test.  This seemed to help.  She failed Plaquenil and then was started on leflunomide which she is on currently.   In 2018 she did have spirometry and DLCO on pulmonary function testing that shows hyperinflation and air trapping with reduced DLCO but also normal spirometry.  In the backdrop of this in January 2020 when she underwent her first Covid vaccine.  Then in early February 2020 when she had a second Covid vaccine.  24 hours later she got hypotensive tachycardic and also hypoxemic and ended up in the ER.  I reviewed the  ER notes.  She was observed in the ER for whole day and her symptoms and signs resolved and the hypoxemia also resolved.  She was discharged with a prednisone course.  She says subsequent to that she was short of breath and fatigue for 2 weeks and this also resolved.  She did see cardiology in February 2021.  She was reassured by a normal stress test.  After that she switched primary care physicians to Dr. Reynold Bowen.  She is now established with a new rheumatologist at [place] and is seen [name] the 81 assistant for the first time on August 27, 2019 2 days ago.  She also had a thyroid biopsy which was nondiagnostic.  On the rheumatology visit 2 days ago it was felt her rheumatoid arthritis was active.  Blood has been drawn and work-up is in progress.  Through all this primary care physician did do CT chest without contrast.  I personally visualized this.  There is evidence of tree-in-bud that is mild and subtle.  Therefore she is referred here.  However at this point in time she is asymptomatic from a respiratory standpoint no wheezing no orthopnea no proximal nocturnal dyspnea no dyspnea on exertion no cough.  However in terms of rheumatoid arthritis there is plans to increase her immunomodulation. She has been started on low dose pred now by [name]. Currently labs  pending are CRP, Quant Gold and Hep Panel   D/w [name] later -> she is considering adding DMARD -discussed and she is supportive of getting bronch and rule out opportunistic infection  ROS - per HPI  IMPRESSION: CT chest visualized 1. Very mild, stable tree-in-bud appearing opacities along the anterolateral aspect of the bilateral upper lobes. Sequelae associated with atypical infection cannot be excluded. 2. Predominant stable thyroid goiter.  Aortic Atherosclerosis (ICD10-I70.0).   Electronically Signed   By: Virgina Norfolk M.D.   On: 07/22/2019 22:34    OV 10/17/2019  Subjective:  Patient ID: Brandy Townsend, female , DOB: 03-07-1951 , age 69 y.o. , MRN: [id number] , ADDRESS: [place] [id number]   10/17/2019 -   Chief Complaint  Patient presents with  . Follow-up    Pt is here to discuss results of the bronch. Pt states she has been doing good since last visit and denies any complaints.     HPI Brandy Townsend 69 y.o. -returns for follow-up.  At this point in time she is on prednisone 20 mg/day.  She has gained weight.  She is got some bruises.  She is not happy about her prednisone.  She denies any respiratory complaints.  Mid June 2020 when she underwent bronchoscopy with lavage.  I reviewed the results.  She is got some mild neutrophilia but organisms such as PCP and TB and fungus are negative.  She really wants to get started on a DMA RD so she can come down or get off prednisone.  I discussed with rheumatology PA [name] and she is of the understanding.  I stated there is no ILD.  They are going to focus on joint treatment.  Patient did indicate that in the fall she is at risk for respiratory exacerbations.  But she lives nearby and is agreeable to a 9-77-month follow-up.  She is supposed to get pulmonary function test today but that was not scheduled by our office.  Results for ALOMA, BOCH (MRN [id number]) as of 10/17/2019 16:38  Ref.  Range  09/12/2019 08:59  Monocyte-Macrophage-Serous Fluid Latest Ref Range: 50 - 90 % 38 (L)  Other Cells, Fluid Latest Units: % CORRELATE WITH CYTOLOGY.  Fluid Type-FCT Unknown Bronch Lavag  Color, Fluid Latest Ref Range: YELLOW  COLORLESS (A)  Total Nucleated Cell Count, Fluid Latest Ref Range: 0 - 1,000 cu mm 15  Lymphs, Fluid Latest Units: % 4  Eos, Fluid Latest Units: % 2  Appearance, Fluid Latest Ref Range: CLEAR  CLOUDY (A)  Neutrophil Count, Fluid Latest Ref Range: 0 - 25 % 56 (H)   ROS - per HPI     has a past medical history of Asthma, Graves disease, Hypertension, Osteoporosis, Rheumatoid arthritis ([place]), Thyroid disease, and Vertigo.   reports that she quit smoking about 10 years ago. Her smoking use included cigarettes. She has a 20.00 pack-year smoking history. She has never used smokeless tobacco.  Past Surgical History:  Procedure Laterality Date  . BRONCHIAL WASHINGS  09/12/2019   Procedure: BRONCHIAL WASHINGS;  Surgeon: Brand Males, MD;  Location: [facility];  Service: Endoscopy;;  . VIDEO BRONCHOSCOPY N/A 09/12/2019   Procedure: VIDEO BRONCHOSCOPY WITHOUT FLUORO;  Surgeon: Brand Males, MD;  Location: [facility];  Service: Endoscopy;  Laterality: N/A;    Allergies  Allergen Reactions  . Macrobid [employer] Macro] Other (See Comments)    Lowered potassium, caused aches and pains    Immunization History  Administered Date(s) Administered  . Influenza Whole 01/04/2017  . Influenza, High Dose Seasonal PF 12/27/2018  . Influenza-Unspecified 12/26/2013, 12/27/2014, 12/27/2015  . PFIZER SARS-COV-2 Vaccination 04/17/2019, 05/06/2019  . Pneumococcal Conjugate-13 04/28/2014  . Zoster 04/01/2019    No family history on file.   Current Outpatient Medications:  .  acetaminophen (TYLENOL) 500 MG tablet, Take 500 mg by mouth every 6 (six) hours as needed for moderate pain., Disp: , Rfl:  .  cetirizine (ZYRTEC) 10 MG tablet, Take 10 mg by  mouth at bedtime as needed for allergies. , Disp: , Rfl:  .  Cholecalciferol (DIALYVITE VITAMIN D 5000) 125 MCG (5000 UT) capsule, Take 5,000 Units by mouth daily., Disp: , Rfl:  .  ibuprofen (ADVIL) 200 MG tablet, Take 200 mg by mouth every 6 (six) hours as needed (inflammation pain). , Disp: , Rfl:  .  KLOR-CON 10 10 MEQ tablet, Take 10 mEq by mouth daily., Disp: , Rfl: 0 .  leflunomide (ARAVA) 20 MG tablet, Take 20 mg by mouth daily., Disp: , Rfl:  .  levothyroxine (SYNTHROID) 50 MCG tablet, Take 50 mcg by mouth daily. , Disp: , Rfl:  .  mometasone-formoterol (DULERA) 200-5 MCG/ACT AERO, Inhale 2 puffs into the lungs in the morning and at bedtime. (Patient taking differently: Inhale 1 puff into the lungs in the morning and at bedtime. ), Disp: 13 g, Rfl: 3 .  predniSONE (DELTASONE) 20 MG tablet, Take 20 mg by mouth daily., Disp: , Rfl:  .  rosuvastatin (CRESTOR) 5 MG tablet, Take 5 mg by mouth daily., Disp: , Rfl:  .  torsemide (DEMADEX) 20 MG tablet, Take 1 tablet (20 mg total) by mouth daily., Disp: 90 tablet, Rfl: 3 .  verapamil (CALAN-SR) 240 MG CR tablet, Take 1 tablet (240 mg total) by mouth daily., Disp: 90 tablet, Rfl: 3      Objective:   Vitals:   10/17/19 1542  BP: (!) 118/60  Pulse: 68  SpO2: 96%  Weight: 151 lb 3.2 oz (68.6 kg)  Height: 5\' 4"  (1.626 m)    Estimated body mass index  is 25.95 kg/m as calculated from the following:   Height as of this encounter: 5\' 4"  (1.626 m).   Weight as of this encounter: 151 lb 3.2 oz (68.6 kg).  @WEIGHTCHANGE @  [employer]   10/17/19 1542  Weight: 151 lb 3.2 oz (68.6 kg)     Physical Exam .  Mildly cushingoid.  Some scattered bruises on the forearm.  Clear to auscultation bilaterally.  Alert and oriented x3.  Speech normal.         Assessment:       ICD-10-CM   1. History of rheumatoid arthritis  Z87.39   2. Pulmonary infiltrates  R91.8        Plan:     Patient Instructions     ICD-10-CM   1. History of  rheumatoid arthritis  Z87.39   2. Pulmonary infiltrates  R91.8     Bronchoscopy 09/12/19 did not show any organism   Plan  - d/w [name] -> she will focus on joiint issues with appropriate medication of choice  - In  9-12 months from now  - do full PFT  - do exhaled nitric oxide testing  Followup  -  9-12 months or sooner if needed     SIGNATURE    Dr. Brand Males, M.D., F.C.C.P,  Pulmonary and Critical Care Medicine Staff Physician, [facility] Director - Interstitial Lung Disease  Program  Pulmonary [place] [facility] [place], [place], [address]  Pager: [phone], If no answer or between  15:00h - 7:00h: call [phone] Telephone: [phone]  4:52 PM 10/17/2019

## 2019-10-28 LAB — ACID FAST CULTURE WITH REFLEXED SENSITIVITIES (MYCOBACTERIA): Acid Fast Culture: NEGATIVE

## 2019-11-11 DIAGNOSIS — N201 Calculus of ureter: Secondary | ICD-10-CM | POA: Diagnosis not present

## 2019-11-13 DIAGNOSIS — R9389 Abnormal findings on diagnostic imaging of other specified body structures: Secondary | ICD-10-CM | POA: Diagnosis not present

## 2019-11-13 DIAGNOSIS — M810 Age-related osteoporosis without current pathological fracture: Secondary | ICD-10-CM | POA: Diagnosis not present

## 2019-11-13 DIAGNOSIS — Z79899 Other long term (current) drug therapy: Secondary | ICD-10-CM | POA: Diagnosis not present

## 2019-11-13 DIAGNOSIS — M0579 Rheumatoid arthritis with rheumatoid factor of multiple sites without organ or systems involvement: Secondary | ICD-10-CM | POA: Diagnosis not present

## 2019-11-27 DIAGNOSIS — M0579 Rheumatoid arthritis with rheumatoid factor of multiple sites without organ or systems involvement: Secondary | ICD-10-CM | POA: Diagnosis not present

## 2019-12-05 DIAGNOSIS — E039 Hypothyroidism, unspecified: Secondary | ICD-10-CM | POA: Diagnosis not present

## 2019-12-05 DIAGNOSIS — E890 Postprocedural hypothyroidism: Secondary | ICD-10-CM | POA: Diagnosis not present

## 2019-12-05 DIAGNOSIS — I10 Essential (primary) hypertension: Secondary | ICD-10-CM | POA: Diagnosis not present

## 2019-12-05 DIAGNOSIS — E042 Nontoxic multinodular goiter: Secondary | ICD-10-CM | POA: Diagnosis not present

## 2019-12-09 NOTE — Progress Notes (Addendum)
CARDIOLOGY OFFICE NOTE  Date:  12/17/2019    Brandy Townsend Date of Birth: 1950-09-07 Medical Record #[id number]  PCP:  Reynold Bowen, MD  Cardiologist:  Gillian Shields   Chief Complaint  Patient presents with  . Follow-up    Seen for Dr. Johnsie Cancel    History of Present Illness: Brandy Townsend is a 69 y.o. female who presents today for a 6 month check. Seen for Dr. Johnsie Cancel.   She has a history of palpitations, atypical chest pain with prior normal Myoview, asthma, RA, former smoker, pulmonary infiltrates and prior thyroid ablation.   Last seen by Dr. Johnsie Cancel in February of 2019.   I saw her back in February - she had been to the ER with SOB/cough. She had had both COVID vaccines. She has had issues with asthma/bronchitis since December along with trouble getting BP down. She had noted that her HR and BP were elevated - also endorsed chest pain - she had increased her CCB which I continued - echo and stress testing was arranged and were reassuring. Lots of stress at home - caring for her husband who has esophageal cancer. She was considering seeing pulmonary again (Dr. Melvyn Novas).   On follow up visit by telehealth visit in March - she was doing well. BP was good. Cardiac status looked good.   She has since gotten back with pulmonary - has had abnormal CT scan. Has had bronchoscopy which was ok. This scan has also noted aortic atherosclerosis.    Comes in today. Here alone. Continues to follow with pulmonary. Her cough returned recently. She had several therapies (steroid shot/antibiotics/inhalers) and has improved. She is on therapy for her RA. Her treatment/infusion for RA has been put off for a bit since she has had antibiotics but will resume in October. Still with a little chest pain and shortness of breath - sometimes with exertion. This worries her given her FH.  Still with lots of stress with her husband who has esophageal cancer. She can get about 8000 steps in per day - 5000  is her minimum. Not dizzy. BP has been good at home - much better than here today. She has had her medicines. She had recent labs with Dr. Forde Dandy just about a week ago. Notes that her lipids have improved with low dose Crestor.   Past Medical History:  Diagnosis Date  . Asthma   . Graves disease   . Hypertension   . Osteoporosis   . Rheumatoid arthritis ([place])   . Thyroid disease   . Vertigo     Past Surgical History:  Procedure Laterality Date  . BRONCHIAL WASHINGS  09/12/2019   Procedure: BRONCHIAL WASHINGS;  Surgeon: Brand Males, MD;  [facility];  Service: Endoscopy;;  . VIDEO BRONCHOSCOPY N/A 09/12/2019   Procedure: VIDEO BRONCHOSCOPY WITHOUT FLUORO;  Surgeon: Brand Males, MD;  [facility];  Service: Endoscopy;  Laterality: N/A;     Medications: Current Meds  Medication Sig  . acetaminophen (TYLENOL) 500 MG tablet Take 500 mg by mouth every 6 (six) hours as needed for moderate pain.  Marland Kitchen albuterol (VENTOLIN HFA) 108 (90 Base) MCG/ACT inhaler Inhale 1-2 puffs into the lungs every 6 (six) hours as needed for wheezing or shortness of breath.  . cetirizine (ZYRTEC) 10 MG tablet Take 10 mg by mouth at bedtime as needed for allergies.   . Cholecalciferol (DIALYVITE VITAMIN D 5000) 125 MCG (5000 UT) capsule Take 5,000 Units by mouth daily.  Marland Kitchen  ibuprofen (ADVIL) 200 MG tablet Take 200 mg by mouth every 6 (six) hours as needed (inflammation pain).   . inFLIXimab-abda (RENFLEXIS IV) Inject 1 ampule into the vein. Every two weeks, then once every six weeks  . KLOR-CON 10 10 MEQ tablet Take 10 mEq by mouth daily.  Marland Kitchen levothyroxine (SYNTHROID) 50 MCG tablet Take 50 mcg by mouth daily.   . methotrexate (RHEUMATREX) 2.5 MG tablet Take 2.5 mg by mouth once a week. Caution:Chemotherapy. Protect from light. Patient takes six pills on Friday  . mometasone-formoterol (DULERA) 200-5 MCG/ACT AERO Inhale 2 puffs into the lungs in the morning and at bedtime.  . rosuvastatin  (CRESTOR) 5 MG tablet Take 5 mg by mouth daily.  Marland Kitchen torsemide (DEMADEX) 20 MG tablet Take 1 tablet (20 mg total) by mouth daily.  . verapamil (CALAN-SR) 240 MG CR tablet Take 1 tablet (240 mg total) by mouth daily.     Allergies: Allergies  Allergen Reactions  . Macrobid [employer] Macro] Other (See Comments)    Lowered potassium, caused aches and pains    Social History: The patient  reports that she quit smoking about 10 years ago. Her smoking use included cigarettes. She has a 20.00 pack-year smoking history. She has never used smokeless tobacco. She reports that she does not drink alcohol and does not use drugs.   Family History: The patient's family history is not on file. Father died at 58 with heart disease - had had prior MIs and had prior CABG. Mother died age 36 with natural causes.   Review of Systems: Please see the history of present illness.   All other systems are reviewed and negative.   Physical Exam: VS:  BP (!) 132/92   Pulse 80   Ht 5' 2.6" (1.59 m)   Wt 148 lb (67.1 kg)   SpO2 97%   BMI 26.55 kg/m  .  BMI Body mass index is 26.55 kg/m.  Wt Readings from Last 3 Encounters:  12/17/19 148 lb (67.1 kg)  12/10/19 152 lb (68.9 kg)  10/17/19 151 lb 3.2 oz (68.6 kg)   Recheck of her BP is 160/90 by me.  General: Alert and in no acute distress.  She is very suntanned.  Cardiac: Regular rate and rhythm. No murmurs, rubs, or gallops. No edema.  Respiratory:  Lungs are clear to auscultation bilaterally with normal work of breathing. No wheezing noted.  GI: Soft and nontender.  MS: No deformity or atrophy. Gait and ROM intact.  Skin: Warm and dry. Color is normal.  Neuro:  Strength and sensation are intact and no gross focal deficits noted.  Psych: Alert, appropriate and with normal affect.   LABORATORY DATA:  EKG:  EKG is not ordered today.    Lab Results  Component Value Date   WBC 10.9 (H) 07/27/2019   HGB 14.2 07/27/2019   HCT 44.1  07/27/2019   PLT 316 07/27/2019   GLUCOSE 116 (H) 07/27/2019   CHOL 180 05/10/2017   TRIG 60 05/10/2017   HDL 75 05/10/2017   LDLCALC 93 05/10/2017   ALT 24 07/27/2019   AST 18 07/27/2019   NA 137 07/27/2019   K 3.7 07/27/2019   CL 103 07/27/2019   CREATININE 0.97 07/27/2019   BUN 13 07/27/2019   CO2 25 07/27/2019   TSH 1.735 05/08/2019     BNP (last 3 results) No results for input(s): BNP in the last 8760 hours.  ProBNP (last 3 results) No results for input(s): PROBNP  in the last 8760 hours.   Other Studies Reviewed Today:  Myoview Study Highlights 04/2019   The left ventricular ejection fraction is hyperdynamic (>65%).  Nuclear stress EF: 77%.  Horizontal ST segment depression ST segment depression of 1 mm was noted during stress in the II, III and aVF leads, and returning to baseline after 5-9 minutes of recovery.  The study is normal.  This is a low risk study.  No change from prior study.    ECHO IMPRESSIONS 04/2019  1. Left ventricular ejection fraction, by estimation, is 55 to 60%. The  left ventricle has normal function. The left ventricle has no regional  wall motion abnormalities. Left ventricular diastolic parameters were  normal.  2. Right ventricular systolic function is normal. The right ventricular  size is normal. There is normal pulmonary artery systolic pressure. The  estimated right ventricular systolic pressure is 95.1 mmHg.  3. The mitral valve is grossly normal. No evidence of mitral valve  regurgitation.  4. The aortic valve is tricuspid. Aortic valve regurgitation is not  visualized. No aortic stenosis is present.  5. The inferior vena cava is normal in size with greater than 50%  respiratory variability, suggesting right atrial pressure of 3 mmHg.    ASSESSMENT & PLAN:    1.  Chest pain/DOE - prior Myoview was low risk - she remains concerned - has multiple CV risk factors along with known aortic atherosclerosis - will  arrange for coronary CT with FFR to further evaluate.   2. HTN - BP is up here today - she reports great control at home - would favor having her continue to follow.   3. Recurrent cough - recent bout of antibiotics and steroid shot with improvement.   4. RA - on therapy. There is concern that her most recent flare from earlier this year was related to COVID vaccine.   5. HLD - on statin - will get labs from PCP.   6. Former smoker - has had some issues with prior asthma/bronchitis/sinusitis - recent round of therapy - now improved - cough is resolved. No wheezing on exam.   7. +FH for CAD - needs aggressive CV risk factor modification.   8. RA - plan per rheumatology  7. Pulmonary infiltrates - prior bronchoscopy noted. Followed by pulmonary.   Current medicines are reviewed with the patient today.  The patient does not have concerns regarding medicines other than what has been noted above.  The following changes have been made:  See above.  Labs/ tests ordered today include:    Orders Placed This Encounter  Procedures  . CT CORONARY MORPH W/CTA COR W/SCORE W/CA W/CM &/OR WO/CM  . CT CORONARY FRACTIONAL FLOW RESERVE DATA PREP  . CT CORONARY FRACTIONAL FLOW RESERVE FLUID ANALYSIS     Disposition:   FU with Dr. Johnsie Cancel in 6 months. Coronary CT will be arranged.    Patient is agreeable to this plan and will call if any problems develop in the interim.   SignedTruitt Merle, NP  12/17/2019 8:23 AM  [facility] [address] [place], [place]  [address] Phone: [phone] Fax: [phone]      Addendum: We were able to get her labs from PCP - LDL looks good with current regimen.    Burtis Junes, RN, [facility] [address] [place],   [address] [phone]

## 2019-12-10 ENCOUNTER — Ambulatory Visit (INDEPENDENT_AMBULATORY_CARE_PROVIDER_SITE_OTHER): Payer: Medicare Other

## 2019-12-10 ENCOUNTER — Other Ambulatory Visit: Payer: Self-pay

## 2019-12-10 ENCOUNTER — Ambulatory Visit: Payer: Medicare Other | Admitting: Adult Health

## 2019-12-10 ENCOUNTER — Encounter: Payer: Self-pay | Admitting: Adult Health

## 2019-12-10 VITALS — BP 120/64 | HR 92 | Temp 97.6°F | Ht 64.0 in | Wt 152.0 lb

## 2019-12-10 DIAGNOSIS — Z20822 Contact with and (suspected) exposure to covid-19: Secondary | ICD-10-CM | POA: Diagnosis not present

## 2019-12-10 DIAGNOSIS — R05 Cough: Secondary | ICD-10-CM

## 2019-12-10 DIAGNOSIS — R059 Cough, unspecified: Secondary | ICD-10-CM

## 2019-12-10 DIAGNOSIS — J4540 Moderate persistent asthma, uncomplicated: Secondary | ICD-10-CM | POA: Diagnosis not present

## 2019-12-10 DIAGNOSIS — J449 Chronic obstructive pulmonary disease, unspecified: Secondary | ICD-10-CM

## 2019-12-10 MED ORDER — ALBUTEROL SULFATE HFA 108 (90 BASE) MCG/ACT IN AERS: 1.0000 | INHALATION_SPRAY | Freq: Four times a day (QID) | RESPIRATORY_TRACT | 2 refills | Status: DC | PRN

## 2019-12-10 MED ORDER — METHYLPREDNISOLONE ACETATE 80 MG/ML IJ SUSP
80.0000 mg | Freq: Once | INTRAMUSCULAR | Status: AC
  Administered 2019-12-10: 80 mg via INTRAMUSCULAR

## 2019-12-10 MED ORDER — CEFDINIR 300 MG PO CAPS: 300.0000 mg | ORAL_CAPSULE | Freq: Two times a day (BID) | ORAL | 0 refills | Status: DC

## 2019-12-10 NOTE — Patient Instructions (Addendum)
Omnicef 300mg  Twice daily  For 1 week  Mucinex DM Twice daily  As needed  Cough/congestion  Chest xray today  Albuterol inhaler 1-2 puffs every 4-6hr as needed for wheezing .  Follow up with Dr. [name] in 1 month with PFT and As needed   Please contact office for sooner follow up if symptoms do not improve or worsen or seek emergency care

## 2019-12-10 NOTE — Progress Notes (Addendum)
_0  ID: Brandy Townsend, female    DOB: 29-Dec-1950, 69 y.o.   MRN: [id number]  Chief Complaint  Patient presents with  . Follow-up    Cough     Referring provider: Reynold Bowen, MD  HPI: 69 year old female former smoker followed for abnormal CT chest with pulmonary infiltrates Medical history significant for rheumatoid arthritis  TEST/EVENTS :  In 2018 she did have spirometry and DLCO on pulmonary function testing that shows hyperinflation and air trapping with reduced DLCO but also normal spirometry.  CT chest July 22, 2019 showed very mild stable tree-in-bud appearing opacities along the bilateral upper lobes.  Bronchoscopy June 2021 showed cytology positive for benign bronchial cells and pulmonary macrophages.,  AFB negative, BAL negative, M tubercular goes complex negative, fungal negative PCP negative  12/10/2019 Acute OV : Cough  Patient presents for an acute office visit.  She complains of 1 week increased cough,congestion , and wheezing . Mucus is clear , hard to cough anything up.  She has underlying rheumatoid arthritis and is recently had her maintenance regimen changed to methotrexate in infliximab.    She had recent Covid test has been negative.  Her Covid vaccines are up-to-date.  She denies any fever or loss of taste or smell.   Patient is being followed for mild pulmonary infiltrates noted on CT chest.  She is negative for interstitial lung disease.  She underwent bronchoscopy in June 2021 that showed negative cultures and BAL.  Patient is followed by rheumatology and is on methotrexate and Renflexis       Allergies  Allergen Reactions  . Macrobid [employer] Macro] Other (See Comments)    Lowered potassium, caused aches and pains    Immunization History  Administered Date(s) Administered  . Influenza Whole 01/04/2017  . Influenza, High Dose Seasonal PF 12/27/2018  . Influenza-Unspecified 12/26/2013, 12/27/2014, 12/27/2015  . PFIZER  SARS-COV-2 Vaccination 04/17/2019, 05/06/2019  . Pneumococcal Conjugate-13 04/28/2014  . Zoster 04/01/2019    Past Medical History:  Diagnosis Date  . Asthma   . Graves disease   . Hypertension   . Osteoporosis   . Rheumatoid arthritis ([place])   . Thyroid disease   . Vertigo     Tobacco History: Social History   Tobacco Use  Smoking Status Former Smoker  . Packs/day: 1.00  . Years: 20.00  . Pack years: 20.00  . Types: Cigarettes  . Quit date: 03/28/2009  . Years since quitting: 10.7  Smokeless Tobacco Never Used  Tobacco Comment   social smoker   Counseling given: Not Answered Comment: social smoker   Outpatient Medications Prior to Visit  Medication Sig Dispense Refill  . acetaminophen (TYLENOL) 500 MG tablet Take 500 mg by mouth every 6 (six) hours as needed for moderate pain.    . cetirizine (ZYRTEC) 10 MG tablet Take 10 mg by mouth at bedtime as needed for allergies.     . Cholecalciferol (DIALYVITE VITAMIN D 5000) 125 MCG (5000 UT) capsule Take 5,000 Units by mouth daily.    Marland Kitchen ibuprofen (ADVIL) 200 MG tablet Take 200 mg by mouth every 6 (six) hours as needed (inflammation pain).     . inFLIXimab-abda (RENFLEXIS IV) Inject 1 ampule into the vein. Every two weeks, then once every six weeks    . KLOR-CON 10 10 MEQ tablet Take 10 mEq by mouth daily.  0  . levothyroxine (SYNTHROID) 50 MCG tablet Take 50 mcg by mouth daily.     . methotrexate (RHEUMATREX) 2.5 MG  tablet Take 2.5 mg by mouth once a week. Caution:Chemotherapy. Protect from light. Patient takes six pills on Friday    . mometasone-formoterol (DULERA) 200-5 MCG/ACT AERO Inhale 2 puffs into the lungs in the morning and at bedtime. (Patient taking differently: Inhale 1 puff into the lungs in the morning and at bedtime. ) 13 g 3  . rosuvastatin (CRESTOR) 5 MG tablet Take 5 mg by mouth daily.    Marland Kitchen torsemide (DEMADEX) 20 MG tablet Take 1 tablet (20 mg total) by mouth daily. 90 tablet 3  . verapamil (CALAN-SR) 240 MG  CR tablet Take 1 tablet (240 mg total) by mouth daily. 90 tablet 3  . leflunomide (ARAVA) 20 MG tablet Take 20 mg by mouth daily.    . predniSONE (DELTASONE) 20 MG tablet Take 20 mg by mouth daily.     No facility-administered medications prior to visit.     Review of Systems:   Constitutional:   No  weight loss, night sweats,  Fevers, chills,  +fatigue, or  lassitude.  HEENT:   No headaches,  Difficulty swallowing,  Tooth/dental problems, or  Sore throat,                No sneezing, itching, ear ache, nasal congestion, post nasal drip,   CV:  No chest pain,  Orthopnea, PND, swelling in lower extremities, anasarca, dizziness, palpitations, syncope.   GI  No heartburn, indigestion, abdominal pain, nausea, vomiting, diarrhea, change in bowel habits, loss of appetite, bloody stools.   Resp:    No chest wall deformity  Skin: no rash or lesions.  GU: no dysuria, change in color of urine, no urgency or frequency.  No flank pain, no hematuria   MS:  No joint pain or swelling.  No decreased range of motion.  No back pain.    Physical Exam  BP 120/64 (BP Location: Left Arm, Cuff Size: Normal)   Pulse 92   Temp 97.6 F (36.4 C) (Temporal)   Ht _0  (1.626 m)   Wt 152 lb (68.9 kg)   SpO2 94% Comment: RA  BMI 26.09 kg/m   GEN: A/Ox3; pleasant , NAD, well nourished    HEENT:  [place]/AT,   NOSE-clear, THROAT-clear, no lesions, no postnasal drip or exudate noted.   NECK:  Supple w/ fair ROM; no JVD; normal carotid impulses w/o bruits; no thyromegaly or nodules palpated; no lymphadenopathy.    RESP few scattered rhonchi and expiratory wheezes no accessory muscle use, no dullness to percussion  CARD:  RRR, no m/r/g, no peripheral edema, pulses intact, no cyanosis or clubbing.  GI:   Soft & nt; nml bowel sounds; no organomegaly or masses detected.   Musco: Warm bil, no deformities or joint swelling noted.   Neuro: alert, no focal deficits noted.    Skin: Warm, no lesions or  rashes    Lab Results:  CBC    ProBNP No results found for: PROBNP  Imaging: DG Chest 2 View  Result Date: 12/10/2019 CLINICAL DATA:  69 year old female with history of cough. History of COPD. EXAM: CHEST - 2 VIEW COMPARISON:  Chest x-ray 05/08/2019. FINDINGS: Diffuse peribronchial cuffing. Lung volumes are normal. No consolidative airspace disease. No pleural effusions. No pneumothorax. No pulmonary nodule or mass noted. Pulmonary vasculature and the cardiomediastinal silhouette are within normal limits. Atherosclerosis in the thoracic aorta. IMPRESSION: 1. Mild diffuse peribronchial cuffing, concerning for an acute bronchitis. 2. Aortic atherosclerosis. Electronically Signed   By: Mauri Brooklyn.D.  On: 12/10/2019 12:46    methylPREDNISolone acetate (DEPO-MEDROL) injection 80 mg    Date Action Dose Route User   12/10/2019 1203 Given  Intramuscular (Left Upper Outer Quadrant) Robbie Louis, RN      PFT Results Latest Ref Rng & Units 01/13/2017  FVC-Pre L 2.43  FVC-Predicted Pre % 79  FVC-Post L 2.46  FVC-Predicted Post % 80  Pre FEV1/FVC % % 73  Post FEV1/FCV % % 76  FEV1-Pre L 1.77  FEV1-Predicted Pre % 75  FEV1-Post L 1.87  DLCO uncorrected ml/min/mmHg 16.68  DLCO UNC% % 72  DLCO corrected ml/min/mmHg 17.68  DLCO COR %Predicted % 77  DLVA Predicted % 83  TLC L 5.90  TLC % Predicted % 120  RV % Predicted % 162    Lab Results  Component Value Date   NITRICOXIDE 33 03/23/2016        Assessment & Plan:   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 We will begin empiric antibiotics.  Check chest x-ray.  Covid testing x2 was negative. We will give a Depo-Medrol 80 mg injection today. Repeat PFTs on return Monitor closely as patient has recently been started on methotrexate and infliximab which both can have potential for pulmonary toxicity  Plan  Patient Instructions  Omnicef 353m  Twice daily  For 1 week  Mucinex DM Twice daily  As needed  Cough/congestion  Chest xray today  Albuterol inhaler 1-2 puffs every 4-6hr as needed for wheezing .  Follow up with Dr. RChase Callerin 1 month with PFT and As needed   Please contact office for sooner follow up if symptoms do not improve or worsen or seek emergency care          TRexene Edison NP 12/10/2019

## 2019-12-10 NOTE — Assessment & Plan Note (Addendum)
Acute asthmatic bronchitis.-Patient is immunosuppressed. We will begin empiric antibiotics.  Check chest x-ray.  Covid testing x2 was negative. We will give a Depo-Medrol 80 mg injection today. Repeat PFTs on return Monitor closely as patient has recently been started on methotrexate and infliximab which both can have potential for pulmonary toxicity  Plan  Patient Instructions  Omnicef 300mg  Twice daily  For 1 week  Mucinex DM Twice daily  As needed  Cough/congestion  Chest xray today  Albuterol inhaler 1-2 puffs every 4-6hr as needed for wheezing .  Follow up with Dr. [name] in 1 month with PFT and As needed   Please contact office for sooner follow up if symptoms do not improve or worsen or seek emergency care

## 2019-12-11 ENCOUNTER — Ambulatory Visit: Payer: Medicare Other | Admitting: Acute Care

## 2019-12-12 ENCOUNTER — Ambulatory Visit: Payer: Medicare Other | Admitting: Adult Health

## 2019-12-16 ENCOUNTER — Other Ambulatory Visit: Payer: Self-pay

## 2019-12-16 ENCOUNTER — Ambulatory Visit
Admission: RE | Admit: 2019-12-16 | Discharge: 2019-12-16 | Disposition: A | Discharge status: Home / Self Care | Payer: Medicare Other | Source: Ambulatory Visit | Attending: Physician Assistant | Admitting: Physician Assistant

## 2019-12-16 DIAGNOSIS — Z780 Asymptomatic menopausal state: Secondary | ICD-10-CM | POA: Diagnosis not present

## 2019-12-16 DIAGNOSIS — H5213 Myopia, bilateral: Secondary | ICD-10-CM | POA: Diagnosis not present

## 2019-12-16 DIAGNOSIS — M810 Age-related osteoporosis without current pathological fracture: Secondary | ICD-10-CM

## 2019-12-17 ENCOUNTER — Encounter: Payer: Self-pay | Admitting: Nurse Practitioner

## 2019-12-17 ENCOUNTER — Telehealth: Payer: Self-pay | Admitting: *Deleted

## 2019-12-17 ENCOUNTER — Other Ambulatory Visit: Payer: Self-pay | Admitting: *Deleted

## 2019-12-17 ENCOUNTER — Ambulatory Visit: Payer: Medicare Other | Admitting: Nurse Practitioner

## 2019-12-17 VITALS — BP 132/92 | HR 80 | Ht 62.6 in | Wt 148.0 lb

## 2019-12-17 DIAGNOSIS — I259 Chronic ischemic heart disease, unspecified: Secondary | ICD-10-CM | POA: Diagnosis not present

## 2019-12-17 DIAGNOSIS — R0789 Other chest pain: Secondary | ICD-10-CM

## 2019-12-17 DIAGNOSIS — R0609 Other forms of dyspnea: Secondary | ICD-10-CM

## 2019-12-17 DIAGNOSIS — I10 Essential (primary) hypertension: Secondary | ICD-10-CM

## 2019-12-17 DIAGNOSIS — E782 Mixed hyperlipidemia: Secondary | ICD-10-CM

## 2019-12-17 DIAGNOSIS — R002 Palpitations: Secondary | ICD-10-CM | POA: Diagnosis not present

## 2019-12-17 DIAGNOSIS — R0600 Dyspnea, unspecified: Secondary | ICD-10-CM

## 2019-12-17 DIAGNOSIS — I700 Atherosclerosis of aorta: Secondary | ICD-10-CM

## 2019-12-17 MED ORDER — METOPROLOL TARTRATE 100 MG PO TABS
100.0000 mg | ORAL_TABLET | ORAL | 0 refills | Status: DC
Start: 2019-12-17 — End: 2020-01-08

## 2019-12-17 MED ORDER — ASPIRIN EC 81 MG PO TBEC: 81.0000 mg | DELAYED_RELEASE_TABLET | Freq: Every day | ORAL | 3 refills | Status: AC

## 2019-12-17 NOTE — Patient Instructions (Addendum)
After Visit Summary:  We will be checking the following labs today - NONE  We will call Dr. Baldwin Crown office and get a copy of your labs   Medication Instructions:    Continue with your current medicines.    If you need a refill on your cardiac medications before your next appointment, please call your pharmacy.     Testing/Procedures To Be Arranged:  Coronary CT  Follow-Up:   See Dr. Johnsie Cancel in 6 months.    At [facility], you and your health needs are our priority.  As part of our continuing mission to provide you with exceptional heart care, we have created designated Provider Care Teams.  These Care Teams include your primary Cardiologist (physician) and Advanced Practice Providers (APPs -  Physician Assistants and Nurse Practitioners) who all work together to provide you with the care you need, when you need it.  Special Instructions:  . Stay safe, wash your hands for at least 20 seconds and wear a mask when needed.  . It was good to talk with you today.    Your cardiac CT will be scheduled at:   [facility] [address] [place], [place] [address] [phone]  If scheduled at [facility], please arrive at the [facility] main entrance of [facility] 30 minutes prior to test start time. Proceed to the [facility] Radiology Department (first floor) to check-in and test prep.   Please follow these instructions carefully (unless otherwise directed):   On the Night Before the Test: . Be sure to Drink plenty of water. . Do not consume any caffeinated/decaffeinated beverages or chocolate 12 hours prior to your test. . Do not take any antihistamines 12 hours prior to your test. .   On the Day of the Test: . Drink plenty of water. Do not drink any water within one hour of the test. . Do not eat any food 4 hours prior to the test. . You may take your regular medications prior to the test.  . Take metoprolol (Lopressor) two hours  prior to test. This has been sent to your pharmacy . HOLD Torsemide the morning of the test. . FEMALES- please wear underwire-free bra if available       After the Test: . Drink plenty of water. . After receiving IV contrast, you may experience a mild flushed feeling. This is normal. . On occasion, you may experience a mild rash up to 24 hours after the test. This is not dangerous. If this occurs, you can take Benadryl 25 mg and increase your fluid intake. . If you experience trouble breathing, this can be serious. If it is severe call 911 IMMEDIATELY. If it is mild, please call our office.    Once we have confirmed authorization from your insurance company, we will call you to set up a date and time for your test. Based on how quickly your insurance processes prior authorizations requests, please allow up to 4 weeks to be contacted for scheduling your Cardiac CT appointment. Be advised that routine Cardiac CT appointments could be scheduled as many as 8 weeks after your provider has ordered it.  For non-scheduling related questions, please contact the cardiac imaging nurse navigator should you have any questions/concerns: Marchia Bond, Cardiac Imaging Nurse Navigator Burley Saver, Interim Cardiac Imaging Nurse [place] [facility] Direct Office Dial: [phone]   For scheduling needs, including cancellations and rescheduling, please call Vivien Rota at [phone], option 3.  Call the [place] office at [phone] if you have any questions, problems or concerns.

## 2019-12-17 NOTE — Telephone Encounter (Signed)
lvm on medical records at Dr. Baldwin Crown office with pt name and dob and fax # to get recent lab results fax to office.

## 2019-12-30 ENCOUNTER — Telehealth: Payer: Self-pay | Admitting: *Deleted

## 2019-12-30 ENCOUNTER — Other Ambulatory Visit: Payer: Self-pay | Admitting: *Deleted

## 2019-12-30 DIAGNOSIS — I259 Chronic ischemic heart disease, unspecified: Secondary | ICD-10-CM

## 2019-12-30 DIAGNOSIS — M0579 Rheumatoid arthritis with rheumatoid factor of multiple sites without organ or systems involvement: Secondary | ICD-10-CM | POA: Diagnosis not present

## 2019-12-30 NOTE — Telephone Encounter (Signed)
lvm for pt to call office to schedule lab appt this week.  bmet will be needed prior to CTA scheduled Oct 11.  Order placed in system.

## 2019-12-30 NOTE — Telephone Encounter (Signed)
-----   Message from Burtis Junes, NP sent at 12/27/2019  3:38 PM EDT ----- Regarding: FW: ct heart Can we get this set up? lori ----- Message ----- From: Roosvelt Maser Sent: 12/27/2019  12:52 PM EDT To: Lorenza Evangelist, RN, Burtis Junes, NP, # Subject: ct heart                                         Patient scheduled 10/11 @ 9:15am.  She will need labs prior  Thanks, Vivien Rota

## 2020-01-01 ENCOUNTER — Other Ambulatory Visit: Payer: Medicare Other

## 2020-01-01 ENCOUNTER — Emergency Department (HOSPITAL_COMMUNITY): Payer: Medicare Other

## 2020-01-01 ENCOUNTER — Telehealth: Payer: Self-pay | Admitting: Nurse Practitioner

## 2020-01-01 ENCOUNTER — Emergency Department (HOSPITAL_COMMUNITY)
Admission: EM | Admit: 2020-01-01 | Discharge: 2020-01-01 | Disposition: A | Discharge status: Home / Self Care | Payer: Medicare Other | Attending: Emergency Medicine | Admitting: Emergency Medicine

## 2020-01-01 ENCOUNTER — Encounter (HOSPITAL_COMMUNITY): Payer: Self-pay

## 2020-01-01 DIAGNOSIS — N200 Calculus of kidney: Secondary | ICD-10-CM

## 2020-01-01 DIAGNOSIS — Z79899 Other long term (current) drug therapy: Secondary | ICD-10-CM | POA: Insufficient documentation

## 2020-01-01 DIAGNOSIS — Z7982 Long term (current) use of aspirin: Secondary | ICD-10-CM | POA: Diagnosis not present

## 2020-01-01 DIAGNOSIS — M47816 Spondylosis without myelopathy or radiculopathy, lumbar region: Secondary | ICD-10-CM | POA: Diagnosis not present

## 2020-01-01 DIAGNOSIS — M4186 Other forms of scoliosis, lumbar region: Secondary | ICD-10-CM | POA: Diagnosis not present

## 2020-01-01 DIAGNOSIS — J45909 Unspecified asthma, uncomplicated: Secondary | ICD-10-CM | POA: Insufficient documentation

## 2020-01-01 DIAGNOSIS — E039 Hypothyroidism, unspecified: Secondary | ICD-10-CM | POA: Diagnosis not present

## 2020-01-01 DIAGNOSIS — R112 Nausea with vomiting, unspecified: Secondary | ICD-10-CM | POA: Insufficient documentation

## 2020-01-01 DIAGNOSIS — Z87891 Personal history of nicotine dependence: Secondary | ICD-10-CM | POA: Diagnosis not present

## 2020-01-01 DIAGNOSIS — Z87442 Personal history of urinary calculi: Secondary | ICD-10-CM | POA: Diagnosis not present

## 2020-01-01 DIAGNOSIS — R1031 Right lower quadrant pain: Secondary | ICD-10-CM | POA: Diagnosis present

## 2020-01-01 DIAGNOSIS — I10 Essential (primary) hypertension: Secondary | ICD-10-CM | POA: Insufficient documentation

## 2020-01-01 DIAGNOSIS — R109 Unspecified abdominal pain: Secondary | ICD-10-CM | POA: Diagnosis not present

## 2020-01-01 LAB — BASIC METABOLIC PANEL
Anion gap: 10 (ref 5–15)
BUN: 16 mg/dL (ref 8–23)
CO2: 25 mmol/L (ref 22–32)
Calcium: 8.9 mg/dL (ref 8.9–10.3)
Chloride: 106 mmol/L (ref 98–111)
Creatinine, Ser: 0.7 mg/dL (ref 0.44–1.00)
GFR calc non Af Amer: >60 mL/min (ref >60)
Glucose, Bld: 102 mg/dL — ABNORMAL HIGH (ref 70–99)
Potassium: 3.6 mmol/L (ref 3.5–5.1)
Sodium: 141 mmol/L (ref 135–145)

## 2020-01-01 LAB — CBC WITH DIFFERENTIAL/PLATELET
Abs Immature Granulocytes: 0.01 10*3/uL (ref 0.00–0.07)
Basophils Absolute: 0.1 10*3/uL (ref 0.0–0.1)
Basophils Relative: 1 %
Eosinophils Absolute: 0.3 10*3/uL (ref 0.0–0.5)
Eosinophils Relative: 5 %
HCT: 38.9 % (ref 36.0–46.0)
Hemoglobin: 13 g/dL (ref 12.0–15.0)
Immature Granulocytes: 0 %
Lymphocytes Relative: 36 %
Lymphs Abs: 2 10*3/uL (ref 0.7–4.0)
MCH: 31 pg (ref 26.0–34.0)
MCHC: 33.4 g/dL (ref 30.0–36.0)
MCV: 92.8 fL (ref 80.0–100.0)
Monocytes Absolute: 0.8 10*3/uL (ref 0.1–1.0)
Monocytes Relative: 13 %
Neutro Abs: 2.4 10*3/uL (ref 1.7–7.7)
Neutrophils Relative %: 45 %
Platelets: 247 10*3/uL (ref 150–400)
RBC: 4.19 MIL/uL (ref 3.87–5.11)
RDW: 14.1 % (ref 11.5–15.5)
WBC: 5.6 10*3/uL (ref 4.0–10.5)
nRBC: 0 % (ref 0.0–0.2)

## 2020-01-01 LAB — URINALYSIS, ROUTINE W REFLEX MICROSCOPIC
Bacteria, UA: NONE SEEN
Bilirubin Urine: NEGATIVE
Glucose, UA: NEGATIVE mg/dL
Hgb urine dipstick: NEGATIVE
Ketones, ur: NEGATIVE mg/dL
Nitrite: NEGATIVE
Protein, ur: NEGATIVE mg/dL
Specific Gravity, Urine: 1.008 (ref 1.005–1.030)
pH: 7 (ref 5.0–8.0)

## 2020-01-01 MED ORDER — SODIUM CHLORIDE 0.9 % IV BOLUS
1000.0000 mL | Freq: Once | INTRAVENOUS | Status: AC
  Administered 2020-01-01: 1000 mL via INTRAVENOUS

## 2020-01-01 MED ORDER — ONDANSETRON 4 MG PO TBDP: 4.0000 mg | ORAL_TABLET | Freq: Three times a day (TID) | ORAL | 0 refills | Status: DC | PRN

## 2020-01-01 MED ORDER — KETOROLAC TROMETHAMINE 15 MG/ML IJ SOLN
15.0000 mg | Freq: Once | INTRAMUSCULAR | Status: AC
  Administered 2020-01-01: 15 mg via INTRAVENOUS
  Filled 2020-01-01: qty 1

## 2020-01-01 MED ORDER — HYDROCODONE-ACETAMINOPHEN 5-325 MG PO TABS: 1.0000 | ORAL_TABLET | Freq: Four times a day (QID) | ORAL | 0 refills | Status: DC | PRN

## 2020-01-01 MED ORDER — MORPHINE SULFATE (PF) 4 MG/ML IV SOLN
4.0000 mg | Freq: Once | INTRAVENOUS | Status: AC
  Administered 2020-01-01: 4 mg via INTRAVENOUS
  Filled 2020-01-01: qty 1

## 2020-01-01 MED ORDER — TAMSULOSIN HCL 0.4 MG PO CAPS
0.4000 mg | ORAL_CAPSULE | Freq: Every day | ORAL | 0 refills | Status: DC
Start: 2020-01-01 — End: 2020-01-20

## 2020-01-01 MED ORDER — ONDANSETRON HCL 4 MG/2ML IJ SOLN
4.0000 mg | Freq: Once | INTRAMUSCULAR | Status: AC
  Administered 2020-01-01: 4 mg via INTRAVENOUS
  Filled 2020-01-01: qty 2

## 2020-01-01 NOTE — Telephone Encounter (Signed)
    Pt is in [facility] ED now, she said she had blood work done there and wanted to ask Danniele if that is enough to get her CT on Monday or she still need to come in and do get her BMET check

## 2020-01-01 NOTE — ED Triage Notes (Signed)
Pt complains of right sided flank pain that woke her up this am, she states the pain feels like it did in April when she had stones

## 2020-01-01 NOTE — ED Provider Notes (Signed)
Care transferred to me. Urinalysis is unremarkable overall. She is feeling much better after Toradol. Her signs/symptoms sound like a ureteral stone and at this point my suspicion of her having a surgical emergency is quite low. Recommend treatment at home with NSAIDs and the narcotic given by Dr. Dina Rich. Return precautions discussed.   Sherwood Gambler, MD 01/01/20 [phone]

## 2020-01-01 NOTE — ED Provider Notes (Signed)
[place] DEPT Provider Note   CSN: [id number] Arrival date & time: 01/01/20  0546     History No chief complaint on file.   Brandy Townsend is a 69 y.o. female.  HPI     This is a 69 year old female with a history of asthma, Graves' disease, hypertension, RA who presents with right-sided flank pain.  Patient reports acute onset of right-sided flank pain that radiates in the right lower quadrant.  Onset was earlier this morning.  She has had similar pain in the past when she was diagnosed with a kidney stone.  She is reported nausea without vomiting.  No noted dysuria or hematuria.  She currently rates her pain at 7 out of 10.  She did take a Tylenol with minimal relief.  She denies any fevers, cough, upper respiratory symptoms.  Past Medical History:  Diagnosis Date  . Asthma   . Graves disease   . Hypertension   . Osteoporosis   . Rheumatoid arthritis ([place])   . Thyroid disease   . Vertigo     Patient Active Problem List   Diagnosis Date Noted  . Pulmonary infiltrate present on computed tomography   . Right kidney stone 07/18/2019  . Nephrolithiasis 07/18/2019  . Rheumatoid arthritis ([place]) 01/14/2017  . Pain in both hands 05/12/2016  . Chronic left shoulder pain 05/12/2016  . Chronic sinusitis of both maxillary sinuses 03/24/2016  . 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 Chest pain syndrome 05/16/2015  . HTN (hypertension) 05/16/2015  . Hyperthyroidism 05/16/2015  . Prerenal azotemia 05/16/2015    Past Surgical History:  Procedure Laterality Date  . BRONCHIAL WASHINGS  09/12/2019   Procedure: BRONCHIAL WASHINGS;  Surgeon: Brand Males, MD;  Location: [facility] ENDOSCOPY;  Service: Endoscopy;;  . VIDEO BRONCHOSCOPY N/A 09/12/2019   Procedure: VIDEO BRONCHOSCOPY WITHOUT FLUORO;  Surgeon: Brand Males, MD;  Location: [facility] ENDOSCOPY;  Service: Endoscopy;  Laterality: N/A;     OB History   No obstetric history  on file.     History reviewed. No pertinent family history.  Social History   Tobacco Use  . Smoking status: Former Smoker    Packs/day: 1.00    Years: 20.00    Pack years: 20.00    Types: Cigarettes    Quit date: 03/28/2009    Years since quitting: 10.7  . Smokeless tobacco: Never Used  . Tobacco comment: social smoker  Vaping Use  . Vaping Use: Never used  Substance Use Topics  . Alcohol use: No  . Drug use: No    Home Medications Prior to Admission medications   Medication Sig Start Date End Date Taking? Authorizing Provider  acetaminophen (TYLENOL) 500 MG tablet Take 500 mg by mouth every 6 (six) hours as needed for moderate pain.    [provider]  albuterol (VENTOLIN HFA) 108 (90 Base) MCG/ACT inhaler Inhale 1-2 puffs into the lungs every 6 (six) hours as needed for wheezing or shortness of breath. 12/10/19   Parrett, Fonnie Mu, NP  aspirin EC 81 MG tablet Take 1 tablet (81 mg total) by mouth daily. Swallow whole. 12/17/19   Burtis Junes, NP  cetirizine (ZYRTEC) 10 MG tablet Take 10 mg by mouth at bedtime as needed for allergies.     [provider]  Cholecalciferol (DIALYVITE VITAMIN D 5000) 125 MCG (5000 UT) capsule Take 5,000 Units by mouth daily.    [provider]  ibuprofen (ADVIL) 200 MG tablet Take  200 mg by mouth every 6 (six) hours as needed (inflammation pain).     [provider]  inFLIXimab-abda (RENFLEXIS IV) Inject 1 ampule into the vein. Every two weeks, then once every six weeks    [provider]  KLOR-CON 10 10 MEQ tablet Take 10 mEq by mouth daily. 08/11/14   [provider]  levothyroxine (SYNTHROID) 50 MCG tablet Take 50 mcg by mouth daily.  08/16/18   [provider]  methotrexate (RHEUMATREX) 2.5 MG tablet Take 2.5 mg by mouth once a week. Caution:Chemotherapy. Protect from light. Patient takes six pills on Friday    [provider]  metoprolol tartrate (LOPRESSOR) 100 MG tablet  Take 1 tablet (100 mg total) by mouth as directed. Take 2 hours prior to CT scan 12/17/19 03/16/20  Burtis Junes, NP  mometasone-formoterol (DULERA) 200-5 MCG/ACT AERO Inhale 2 puffs into the lungs in the morning and at bedtime. 06/24/19   Martyn Ehrich, NP  rosuvastatin (CRESTOR) 5 MG tablet Take 5 mg by mouth daily.    [provider]  torsemide (DEMADEX) 20 MG tablet Take 1 tablet (20 mg total) by mouth daily. 05/15/19   Burtis Junes, NP  verapamil (CALAN-SR) 240 MG CR tablet Take 1 tablet (240 mg total) by mouth daily. 05/13/19   Burtis Junes, NP    Allergies    Macrobid [nitrofurantoin monohyd macro]  Review of Systems   Review of Systems  Constitutional: Negative for fever.  Respiratory: Negative for shortness of breath.   Cardiovascular: Negative for chest pain.  Gastrointestinal: Positive for nausea. Negative for vomiting.  Genitourinary: Positive for flank pain. Negative for dysuria and hematuria.  All other systems reviewed and are negative.   Physical Exam Updated Vital Signs There were no vitals taken for this visit.  Physical Exam Vitals and nursing note reviewed.  Constitutional:      Appearance: She is well-developed.  HENT:     Head: Normocephalic and atraumatic.     Nose: Nose normal.     Mouth/Throat:     Mouth: Mucous membranes are moist.  Eyes:     Pupils: Pupils are equal, round, and reactive to light.  Cardiovascular:     Rate and Rhythm: Normal rate and regular rhythm.     Heart sounds: Normal heart sounds.  Pulmonary:     Effort: Pulmonary effort is normal. No respiratory distress.     Breath sounds: No wheezing.  Abdominal:     General: Bowel sounds are normal.     Palpations: Abdomen is soft.     Tenderness: There is no abdominal tenderness. There is no right CVA tenderness, left CVA tenderness, guarding or rebound.  Musculoskeletal:     Right lower leg: No edema.     Left lower leg: No edema.  Skin:    General: Skin is  warm and dry.  Neurological:     Mental Status: She is alert and oriented to person, place, and time.  Psychiatric:        Mood and Affect: Mood normal.     ED Results / Procedures / Treatments   Labs (all labs ordered are listed, but only abnormal results are displayed) Labs Reviewed  CBC WITH DIFFERENTIAL/PLATELET  BASIC METABOLIC PANEL  URINALYSIS, ROUTINE W REFLEX MICROSCOPIC    EKG None  Radiology No results found.  Procedures Procedures (including critical care time)  Medications Ordered in ED Medications  sodium chloride 0.9 % bolus 1,000 mL (has no administration  in time range)  morphine 4 MG/ML injection 4 mg (has no administration in time range)  ondansetron (ZOFRAN) injection 4 mg (has no administration in time range)    ED Course  I have reviewed the triage vital signs and the nursing notes.  Pertinent labs & imaging results that were available during my care of the patient were reviewed by me and considered in my medical decision making (see chart for details).    MDM Rules/Calculators/A&P                           Patient presents with right flank pain consistent with her prior kidney stones.  She is overall nontoxic-appearing and vital signs are reassuring.  She is afebrile.  She has no reproducible tenderness on exam.  Given history of kidney stones with similar pain, this would be consistent.  Less likely intra-abdominal pathology such as cholecystitis or appendicitis.  Patient was given pain and nausea medication as well as fluids.  KUB does not show any large radiopaque stones.  CBC and BMP are reassuring without metabolic derangements or kidney injury.  On recheck, patient states she feels much better.  She was also given a dose of Toradol.  I have asked her to provide a urine sample to Townsend out urinary tract infection.  If this is negative, I will treat her presumptively for kidney stone.  Patient signed out to oncoming provider.  After history, exam,  and medical workup I feel the patient has been appropriately medically screened and is safe for discharge home. Pertinent diagnoses were discussed with the patient. Patient was given return precautions.   Final Clinical Impression(s) / ED Diagnoses Final diagnoses:  Kidney stone    Rx / DC Orders ED Discharge Orders    None       Merryl Hacker, MD 01/01/20 2353

## 2020-01-01 NOTE — Discharge Instructions (Addendum)
If you develop fever, vomiting, new or worsening pain, urinary tract infection symptoms, or any other new/concerning symptoms then return to the ER for evaluation.

## 2020-01-01 NOTE — Telephone Encounter (Signed)
S/w pt is in [facility] due to Kidney Stones.  Pt had bmet in hospital today and bmet is ok.  Pt had lab appt for tomorrow, canceled due to having bmet  today for upcoming test on Oct 11 Coronary CTA.  Pt advised to make sure Hospital knows pt is having this upcoming test due to contrast. Pt is aware and has already informed hospital.

## 2020-01-03 ENCOUNTER — Telehealth (HOSPITAL_COMMUNITY): Payer: Self-pay | Admitting: Emergency Medicine

## 2020-01-03 NOTE — Telephone Encounter (Signed)
Reaching out to patient to offer assistance regarding upcoming cardiac imaging study; pt verbalizes understanding of appt date/time, parking situation and where to check in, pre-test NPO status and medications ordered, and verified current allergies; name and call back number provided for further questions should they arise Kaliel Bolds RN Navigator Cardiac Imaging [facility] [phone] office [phone] cell 

## 2020-01-06 ENCOUNTER — Ambulatory Visit (HOSPITAL_COMMUNITY)
Admission: RE | Admit: 2020-01-06 | Discharge: 2020-01-06 | Disposition: A | Discharge status: Home / Self Care | Payer: Medicare Other | Source: Ambulatory Visit | Attending: Nurse Practitioner | Admitting: Nurse Practitioner

## 2020-01-06 ENCOUNTER — Other Ambulatory Visit: Payer: Self-pay

## 2020-01-06 ENCOUNTER — Encounter (HOSPITAL_COMMUNITY): Payer: Self-pay

## 2020-01-06 DIAGNOSIS — I2510 Atherosclerotic heart disease of native coronary artery without angina pectoris: Secondary | ICD-10-CM | POA: Insufficient documentation

## 2020-01-06 DIAGNOSIS — I259 Chronic ischemic heart disease, unspecified: Secondary | ICD-10-CM | POA: Diagnosis not present

## 2020-01-06 MED ORDER — NITROGLYCERIN 0.4 MG SL SUBL
0.8000 mg | SUBLINGUAL_TABLET | Freq: Once | SUBLINGUAL | Status: AC
  Administered 2020-01-06: 0.8 mg via SUBLINGUAL

## 2020-01-06 MED ORDER — NITROGLYCERIN 0.4 MG SL SUBL
SUBLINGUAL_TABLET | SUBLINGUAL | Status: AC
  Filled 2020-01-06: qty 2

## 2020-01-06 MED ORDER — IOHEXOL 350 MG/ML SOLN
80.0000 mL | Freq: Once | INTRAVENOUS | Status: AC | PRN
  Administered 2020-01-06: 80 mL via INTRAVENOUS

## 2020-01-07 DIAGNOSIS — M0579 Rheumatoid arthritis with rheumatoid factor of multiple sites without organ or systems involvement: Secondary | ICD-10-CM | POA: Diagnosis not present

## 2020-01-07 DIAGNOSIS — Z79899 Other long term (current) drug therapy: Secondary | ICD-10-CM | POA: Diagnosis not present

## 2020-01-07 DIAGNOSIS — R9389 Abnormal findings on diagnostic imaging of other specified body structures: Secondary | ICD-10-CM | POA: Diagnosis not present

## 2020-01-07 DIAGNOSIS — M810 Age-related osteoporosis without current pathological fracture: Secondary | ICD-10-CM | POA: Diagnosis not present

## 2020-01-07 DIAGNOSIS — Z87442 Personal history of urinary calculi: Secondary | ICD-10-CM | POA: Diagnosis not present

## 2020-01-08 ENCOUNTER — Encounter (HOSPITAL_COMMUNITY): Payer: Self-pay | Admitting: Emergency Medicine

## 2020-01-08 ENCOUNTER — Encounter (HOSPITAL_COMMUNITY)
Admission: EM | Disposition: A | Discharge status: Home / Self Care | Payer: Self-pay | Source: Home / Self Care | Attending: Emergency Medicine

## 2020-01-08 ENCOUNTER — Other Ambulatory Visit: Payer: Self-pay

## 2020-01-08 ENCOUNTER — Observation Stay (HOSPITAL_COMMUNITY)
Admission: EM | Admit: 2020-01-08 | Discharge: 2020-01-08 | Disposition: A | Discharge status: Home / Self Care | Payer: Medicare Other | Attending: Cardiovascular Disease | Admitting: Cardiovascular Disease

## 2020-01-08 ENCOUNTER — Ambulatory Visit: Payer: Medicare Other | Admitting: Nurse Practitioner

## 2020-01-08 ENCOUNTER — Emergency Department (HOSPITAL_COMMUNITY): Payer: Medicare Other

## 2020-01-08 DIAGNOSIS — F419 Anxiety disorder, unspecified: Secondary | ICD-10-CM | POA: Diagnosis not present

## 2020-01-08 DIAGNOSIS — R42 Dizziness and giddiness: Secondary | ICD-10-CM | POA: Diagnosis not present

## 2020-01-08 DIAGNOSIS — R079 Chest pain, unspecified: Secondary | ICD-10-CM | POA: Diagnosis not present

## 2020-01-08 DIAGNOSIS — R0789 Other chest pain: Secondary | ICD-10-CM | POA: Diagnosis not present

## 2020-01-08 DIAGNOSIS — I2510 Atherosclerotic heart disease of native coronary artery without angina pectoris: Secondary | ICD-10-CM

## 2020-01-08 DIAGNOSIS — Z20822 Contact with and (suspected) exposure to covid-19: Secondary | ICD-10-CM | POA: Insufficient documentation

## 2020-01-08 DIAGNOSIS — Z87891 Personal history of nicotine dependence: Secondary | ICD-10-CM | POA: Insufficient documentation

## 2020-01-08 DIAGNOSIS — J45909 Unspecified asthma, uncomplicated: Secondary | ICD-10-CM | POA: Diagnosis not present

## 2020-01-08 DIAGNOSIS — I10 Essential (primary) hypertension: Secondary | ICD-10-CM | POA: Diagnosis not present

## 2020-01-08 DIAGNOSIS — R0600 Dyspnea, unspecified: Secondary | ICD-10-CM

## 2020-01-08 DIAGNOSIS — Z7982 Long term (current) use of aspirin: Secondary | ICD-10-CM | POA: Diagnosis not present

## 2020-01-08 DIAGNOSIS — J4540 Moderate persistent asthma, uncomplicated: Secondary | ICD-10-CM | POA: Diagnosis present

## 2020-01-08 DIAGNOSIS — M069 Rheumatoid arthritis, unspecified: Secondary | ICD-10-CM | POA: Diagnosis present

## 2020-01-08 DIAGNOSIS — Z79899 Other long term (current) drug therapy: Secondary | ICD-10-CM | POA: Diagnosis not present

## 2020-01-08 DIAGNOSIS — R0902 Hypoxemia: Secondary | ICD-10-CM | POA: Diagnosis not present

## 2020-01-08 HISTORY — PX: LEFT HEART CATH AND CORONARY ANGIOGRAPHY: CATH118249

## 2020-01-08 HISTORY — PX: CORONARY PRESSURE/FFR STUDY: CATH118243

## 2020-01-08 HISTORY — DX: Atherosclerotic heart disease of native coronary artery without angina pectoris: I25.10

## 2020-01-08 LAB — CBC
HCT: 42.7 % (ref 36.0–46.0)
Hemoglobin: 13.6 g/dL (ref 12.0–15.0)
MCH: 29.6 pg (ref 26.0–34.0)
MCHC: 31.9 g/dL (ref 30.0–36.0)
MCV: 92.8 fL (ref 80.0–100.0)
Platelets: 276 10*3/uL (ref 150–400)
RBC: 4.6 MIL/uL (ref 3.87–5.11)
RDW: 13.8 % (ref 11.5–15.5)
WBC: 8.4 10*3/uL (ref 4.0–10.5)
nRBC: 0 % (ref 0.0–0.2)

## 2020-01-08 LAB — BASIC METABOLIC PANEL
Anion gap: 14 (ref 5–15)
BUN: 13 mg/dL (ref 8–23)
CO2: 25 mmol/L (ref 22–32)
Calcium: 9.7 mg/dL (ref 8.9–10.3)
Chloride: 102 mmol/L (ref 98–111)
Creatinine, Ser: 0.89 mg/dL (ref 0.44–1.00)
GFR, Estimated: >60 mL/min (ref >60)
Glucose, Bld: 113 mg/dL — ABNORMAL HIGH (ref 70–99)
Potassium: 3.4 mmol/L — ABNORMAL LOW (ref 3.5–5.1)
Sodium: 141 mmol/L (ref 135–145)

## 2020-01-08 LAB — TROPONIN I (HIGH SENSITIVITY)
Troponin I (High Sensitivity): 3 ng/L (ref <18)
Troponin I (High Sensitivity): 3 ng/L (ref <18)

## 2020-01-08 LAB — RESPIRATORY PANEL BY RT PCR (FLU A&B, COVID)
Influenza A by PCR: NEGATIVE
Influenza B by PCR: NEGATIVE
SARS Coronavirus 2 by RT PCR: NEGATIVE

## 2020-01-08 SURGERY — LEFT HEART CATH AND CORONARY ANGIOGRAPHY
Anesthesia: LOCAL

## 2020-01-08 MED ORDER — ACETAMINOPHEN 325 MG PO TABS: 650.0000 mg | ORAL_TABLET | ORAL | Status: DC | PRN

## 2020-01-08 MED ORDER — ROSUVASTATIN CALCIUM 5 MG PO TABS: 5.0000 mg | ORAL_TABLET | Freq: Every day | ORAL | Status: DC

## 2020-01-08 MED ORDER — LORAZEPAM 2 MG/ML IJ SOLN
0.5000 mg | Freq: Once | INTRAMUSCULAR | Status: AC
  Administered 2020-01-08: 0.5 mg via INTRAVENOUS
  Filled 2020-01-08: qty 1

## 2020-01-08 MED ORDER — SODIUM CHLORIDE 0.9 % IV SOLN: 250.0000 mL | INTRAVENOUS | Status: DC | PRN

## 2020-01-08 MED ORDER — LIDOCAINE HCL (PF) 1 % IJ SOLN
INTRAMUSCULAR | Status: AC
  Filled 2020-01-08: qty 30

## 2020-01-08 MED ORDER — SODIUM CHLORIDE 0.9% FLUSH: 3.0000 mL | Freq: Two times a day (BID) | INTRAVENOUS | Status: DC

## 2020-01-08 MED ORDER — LEVOTHYROXINE SODIUM 50 MCG PO TABS: 50.0000 ug | ORAL_TABLET | Freq: Every day | ORAL | Status: DC

## 2020-01-08 MED ORDER — SODIUM CHLORIDE 0.9 % WEIGHT BASED INFUSION: 1.0000 mL/kg/h | INTRAVENOUS | Status: AC

## 2020-01-08 MED ORDER — MOMETASONE FURO-FORMOTEROL FUM 200-5 MCG/ACT IN AERO: 2.0000 | INHALATION_SPRAY | Freq: Two times a day (BID) | RESPIRATORY_TRACT | Status: DC

## 2020-01-08 MED ORDER — TAMSULOSIN HCL 0.4 MG PO CAPS: 0.4000 mg | ORAL_CAPSULE | Freq: Every day | ORAL | Status: DC

## 2020-01-08 MED ORDER — FENTANYL CITRATE (PF) 100 MCG/2ML IJ SOLN
INTRAMUSCULAR | Status: DC | PRN
Start: 2020-01-08 — End: 2020-01-08
  Administered 2020-01-08: 25 ug via INTRAVENOUS

## 2020-01-08 MED ORDER — SODIUM CHLORIDE 0.9% FLUSH: 3.0000 mL | INTRAVENOUS | Status: DC | PRN

## 2020-01-08 MED ORDER — FENTANYL CITRATE (PF) 100 MCG/2ML IJ SOLN
INTRAMUSCULAR | Status: AC
Start: 2020-01-08 — End: ?
  Filled 2020-01-08: qty 2

## 2020-01-08 MED ORDER — ISOSORBIDE MONONITRATE ER 30 MG PO TB24: 15.0000 mg | ORAL_TABLET | Freq: Every day | ORAL | 11 refills | Status: DC

## 2020-01-08 MED ORDER — NITROGLYCERIN 1 MG/10 ML FOR IR/CATH LAB
INTRA_ARTERIAL | Status: DC | PRN
  Administered 2020-01-08: 200 ug

## 2020-01-08 MED ORDER — LIDOCAINE HCL (PF) 1 % IJ SOLN
INTRAMUSCULAR | Status: DC | PRN
  Administered 2020-01-08: 2 mL

## 2020-01-08 MED ORDER — POTASSIUM CHLORIDE ER 10 MEQ PO TBCR: 10.0000 meq | EXTENDED_RELEASE_TABLET | Freq: Every day | ORAL | Status: DC

## 2020-01-08 MED ORDER — VERAPAMIL HCL 2.5 MG/ML IV SOLN
INTRAVENOUS | Status: DC | PRN
  Administered 2020-01-08: 10 mL via INTRA_ARTERIAL

## 2020-01-08 MED ORDER — HEPARIN (PORCINE) IN NACL 1000-0.9 UT/500ML-% IV SOLN
INTRAVENOUS | Status: AC
  Filled 2020-01-08: qty 1000

## 2020-01-08 MED ORDER — CARVEDILOL 3.125 MG PO TABS: 3.1250 mg | ORAL_TABLET | Freq: Two times a day (BID) | ORAL | 11 refills | Status: DC

## 2020-01-08 MED ORDER — ASPIRIN 81 MG PO CHEW: 81.0000 mg | CHEWABLE_TABLET | ORAL | Status: DC

## 2020-01-08 MED ORDER — MIDAZOLAM HCL 2 MG/2ML IJ SOLN
INTRAMUSCULAR | Status: AC
  Filled 2020-01-08: qty 2

## 2020-01-08 MED ORDER — ALBUTEROL SULFATE HFA 108 (90 BASE) MCG/ACT IN AERS: 1.0000 | INHALATION_SPRAY | Freq: Four times a day (QID) | RESPIRATORY_TRACT | Status: DC | PRN

## 2020-01-08 MED ORDER — SODIUM CHLORIDE 0.9 % WEIGHT BASED INFUSION: 1.0000 mL/kg/h | INTRAVENOUS | Status: DC

## 2020-01-08 MED ORDER — HEPARIN SODIUM (PORCINE) 1000 UNIT/ML IJ SOLN
INTRAMUSCULAR | Status: AC
  Filled 2020-01-08: qty 1

## 2020-01-08 MED ORDER — TORSEMIDE 20 MG PO TABS: 20.0000 mg | ORAL_TABLET | Freq: Every day | ORAL | Status: DC

## 2020-01-08 MED ORDER — ASPIRIN EC 81 MG PO TBEC: 81.0000 mg | DELAYED_RELEASE_TABLET | Freq: Every day | ORAL | Status: DC

## 2020-01-08 MED ORDER — VERAPAMIL HCL 2.5 MG/ML IV SOLN
INTRAVENOUS | Status: AC
  Filled 2020-01-08: qty 2

## 2020-01-08 MED ORDER — MIDAZOLAM HCL 2 MG/2ML IJ SOLN
INTRAMUSCULAR | Status: DC | PRN
  Administered 2020-01-08: 1 mg via INTRAVENOUS

## 2020-01-08 MED ORDER — LORATADINE 10 MG PO TABS: 10.0000 mg | ORAL_TABLET | Freq: Every day | ORAL | Status: DC

## 2020-01-08 MED ORDER — ONDANSETRON HCL 4 MG/2ML IJ SOLN: 4.0000 mg | Freq: Four times a day (QID) | INTRAMUSCULAR | Status: DC | PRN

## 2020-01-08 MED ORDER — NITROGLYCERIN 1 MG/10 ML FOR IR/CATH LAB
INTRA_ARTERIAL | Status: AC
  Filled 2020-01-08: qty 10

## 2020-01-08 MED ORDER — IOHEXOL 350 MG/ML SOLN
INTRAVENOUS | Status: DC | PRN
  Administered 2020-01-08: 70 mL

## 2020-01-08 MED ORDER — HEPARIN SODIUM (PORCINE) 1000 UNIT/ML IJ SOLN
INTRAMUSCULAR | Status: DC | PRN
  Administered 2020-01-08 (×2): 3500 [IU] via INTRAVENOUS

## 2020-01-08 MED ORDER — HEPARIN (PORCINE) IN NACL 1000-0.9 UT/500ML-% IV SOLN
INTRAVENOUS | Status: DC | PRN
  Administered 2020-01-08 (×2): 500 mL

## 2020-01-08 MED ORDER — HYDROCODONE-ACETAMINOPHEN 5-325 MG PO TABS
1.0000 | ORAL_TABLET | Freq: Four times a day (QID) | ORAL | Status: DC | PRN
Start: 2020-01-08 — End: 2020-01-08

## 2020-01-08 MED ORDER — POTASSIUM CHLORIDE CRYS ER 20 MEQ PO TBCR
40.0000 meq | EXTENDED_RELEASE_TABLET | Freq: Once | ORAL | Status: AC
  Administered 2020-01-08: 40 meq via ORAL
  Filled 2020-01-08: qty 2

## 2020-01-08 MED ORDER — SODIUM CHLORIDE 0.9 % WEIGHT BASED INFUSION
3.0000 mL/kg/h | INTRAVENOUS | Status: AC
  Administered 2020-01-08: 3 mL/kg/h via INTRAVENOUS

## 2020-01-08 MED ORDER — CHOLECALCIFEROL 125 MCG (5000 UT) PO CAPS: 5000.0000 [IU] | ORAL_CAPSULE | Freq: Every day | ORAL | Status: DC

## 2020-01-08 MED ORDER — VERAPAMIL HCL ER 240 MG PO TBCR: 240.0000 mg | EXTENDED_RELEASE_TABLET | Freq: Every day | ORAL | Status: DC

## 2020-01-08 MED ORDER — KETOROLAC TROMETHAMINE 30 MG/ML IJ SOLN
30.0000 mg | Freq: Once | INTRAMUSCULAR | Status: AC
  Administered 2020-01-08: 30 mg via INTRAVENOUS
  Filled 2020-01-08: qty 1

## 2020-01-08 SURGICAL SUPPLY — 12 items
CATH 5FR JL3.5 JR4 ANG PIG MP (CATHETERS) ×1 IMPLANT
CATH VISTA GUIDE 6FR JR4 (CATHETERS) ×1 IMPLANT
GLIDESHEATH SLEND SS 6F .021 (SHEATH) ×2 IMPLANT
GUIDEWIRE INQWIRE 1.5J.035X260 (WIRE) IMPLANT
GUIDEWIRE PRESSURE COMET II (WIRE) ×1 IMPLANT
INQWIRE 1.5J .035X260CM (WIRE) ×2
KIT ESSENTIALS PG (KITS) ×2 IMPLANT
KIT HEART LEFT (KITS) ×2 IMPLANT
PACK CARDIAC CATHETERIZATION (CUSTOM PROCEDURE TRAY) ×2 IMPLANT
SHEATH PROBE COVER 6X72 (BAG) ×1 IMPLANT
TRANSDUCER W/STOPCOCK (MISCELLANEOUS) ×2 IMPLANT
TUBING CIL FLEX 10 FLL-RA (TUBING) ×2 IMPLANT

## 2020-01-08 NOTE — Discharge Instructions (Signed)
Radial Site Care  This sheet gives you information about how to care for yourself after your procedure. Your health care provider may also give you more specific instructions. If you have problems or questions, contact your health care provider. What can I expect after the procedure? After the procedure, it is common to have:  Bruising and tenderness at the catheter insertion area. Follow these instructions at home: Medicines  Take over-the-counter and prescription medicines only as told by your health care provider. Insertion site care  Follow instructions from your health care provider about how to take care of your insertion site. Make sure you: ? Wash your hands with soap and water before you change your bandage (dressing). If soap and water are not available, use hand sanitizer. ? Change your dressing as told by your health care provider. ? Leave stitches (sutures), skin glue, or adhesive strips in place. These skin closures may need to stay in place for 2 weeks or longer. If adhesive strip edges start to loosen and curl up, you may trim the loose edges. Do not remove adhesive strips completely unless your health care provider tells you to do that.  Check your insertion site every day for signs of infection. Check for: ? Redness, swelling, or pain. ? Fluid or blood. ? Pus or a bad smell. ? Warmth.  Do not take baths, swim, or use a hot tub until your health care provider approves.  You may shower 24-48 hours after the procedure, or as directed by your health care provider. ? Remove the dressing and gently wash the site with plain soap and water. ? Pat the area dry with a clean towel. ? Do not rub the site. That could cause bleeding.  Do not apply powder or lotion to the site. Activity   For 24 hours after the procedure, or as directed by your health care provider: ? Do not flex or bend the affected arm. ? Do not push or pull heavy objects with the affected arm. ? Do not  drive yourself home from the hospital or clinic. You may drive 24 hours after the procedure unless your health care provider tells you not to. ? Do not operate machinery or power tools.  Do not lift anything that is heavier than 10 lb (4.5 kg), or the limit that you are told, until your health care provider says that it is safe.  Ask your health care provider when it is okay to: ? Return to work or school. ? Resume usual physical activities or sports. ? Resume sexual activity. General instructions  If the catheter site starts to bleed, raise your arm and put firm pressure on the site. If the bleeding does not stop, get help right away. This is a medical emergency.  If you went home on the same day as your procedure, a responsible adult should be with you for the first 24 hours after you arrive home.  Keep all follow-up visits as told by your health care provider. This is important. Contact a health care provider if:  You have a fever.  You have redness, swelling, or yellow drainage around your insertion site. Get help right away if:  You have unusual pain at the radial site.  The catheter insertion area swells very fast.  The insertion area is bleeding, and the bleeding does not stop when you hold steady pressure on the area.  Your arm or hand becomes pale, cool, tingly, or numb. These symptoms may represent a serious problem   that is an emergency. Do not wait to see if the symptoms will go away. Get medical help right away. Call your local emergency services (911 in the [place] Do not drive yourself to the hospital. Summary  After the procedure, it is common to have bruising and tenderness at the site.  Follow instructions from your health care provider about how to take care of your radial site wound. Check the wound every day for signs of infection.  Do not lift anything that is heavier than 10 lb (4.5 kg), or the limit that you are told, until your health care provider says  that it is safe. This information is not intended to replace advice given to you by your health care provider. Make sure you discuss any questions you have with your health care provider. Document Revised: 04/19/2017 Document Reviewed: 04/19/2017 Elsevier Patient Education  [place] TO BRING ALL OF YOUR MEDICATIONS TO EACH OF YOUR FOLLOW-UP OFFICE VISITS.  PLEASE ATTEND ALL SCHEDULED FOLLOW-UP APPOINTMENTS.   Activity: Increase activity slowly as tolerated. You may shower, but no soaking baths (or swimming) for 1 week. No driving for 2 days. No lifting over 5 lbs for 1 week. No sexual activity for 1 week.   You May Return to Work: in 1 week (if applicable)  Wound Care: You may wash cath site gently with soap and water. Keep cath site clean and dry. If you notice pain, swelling, bleeding or pus at your cath site, please call [phone].    Cardiac Cath Site Care Refer to this sheet in the next few weeks. These instructions provide you with information on caring for yourself after your procedure. Your caregiver may also give you more specific instructions. Your treatment has been planned according to current medical practices, but problems sometimes occur. Call your caregiver if you have any problems or questions after your procedure. HOME CARE INSTRUCTIONS  You may shower 24 hours after the procedure. Remove the bandage (dressing) and gently wash the site with plain soap and water. Gently pat the site dry.   Do not apply powder or lotion to the site.   Do not sit in a bathtub, swimming pool, or whirlpool for 5 to 7 days.   No bending, squatting, or lifting anything over 10 pounds (4.5 kg) as directed by your caregiver.   Inspect the site at least twice daily.   Do not drive home if you are discharged the same day of the procedure. Have someone else drive you.   You may drive 24 hours after the procedure unless otherwise instructed by your caregiver.   What to expect:  Any bruising will usually fade within 1 to 2 weeks.   Blood that collects in the tissue (hematoma) may be painful to the touch. It should usually decrease in size and tenderness within 1 to 2 weeks.  SEEK IMMEDIATE MEDICAL CARE IF:  You have unusual pain at the site or down the affected limb.   You have redness, warmth, swelling, or pain at the site.   You have drainage (other than a small amount of blood on the dressing).   You have chills.   You have a fever or persistent symptoms for more than 72 hours.   You have a fever and your symptoms suddenly get worse.   Your leg becomes pale, cool, tingly, or numb.   You have heavy bleeding from the site. Hold pressure on the site.  Document Released: 04/16/2010 Document  Revised: 03/03/2011 Document Reviewed: 04/16/2010 [facility] Patient Information 2012 [place].

## 2020-01-08 NOTE — ED Triage Notes (Signed)
Pt arrives via [facility] with c/o dizziness, nausea and chest pain, reports chest pain has been ongoing for a few months but worse over the past few days, 324mg  asa taken prior to ems arrival, ems reports pt scheduled for cardiac cath but pt unsure when. 4mg  zofran given en route. 20G iv LAC. A/ox4. resp e/u, nad.

## 2020-01-08 NOTE — Consult Note (Signed)
Consult Note    Brandy Townsend LZJ:[id number] DOB: 1950/04/09 DOA: 01/08/2020  PCP: Reynold Bowen, MD Consultants:  Pearline Cables - rheumatology; [name] - pulmonology Patient coming from:  Home - lives with husband; NOK: Husband, [phone]; Son, [phone]  Chief Complaint: chest pain  HPI: Brandy Townsend is a 69 y.o. female with medical history significant of RA; HTN; and hypothyroidism presenting with chest pain. She was called this AM to report that she needs a heart cath and was told to come in the office today at 3.  She started feeling worse and was instructed to go to the ER now.  She has been having CP but thought this was related to her RA since she had a stress test and echo last spring without difficulty.  She has been having pressure for months.  Some days it is worse than other and is now daily.  Substernal discomfort, feels better if she stands up straight and takes a deep breath.  It is worse with significant exertion.  She does get associated SOB.  She had nephrolithiasis last week.  She was in the ER the day after her second vaccine with tachycardia, HTN, hypoxia.    ED Course:  Unstable angina - typical CP for a few months, CTA with significant RCA stenosis.  Scheduled for cath but developed CP while listening to the results.  Looks fine now, no active CP.  Troponin negative.  Cardiology has seen.  Review of Systems: As per HPI; otherwise review of systems reviewed and negative.   Ambulatory Status:  Ambulates without assistance  COVID Vaccine Status:   Complete  Past Medical History:  Diagnosis Date  . Asthma   . CAD (coronary artery disease)   . Graves disease    s/p RAI  . Hypertension   . Osteoporosis   . Rheumatoid arthritis ([place])   . Thyroid disease   . Vertigo     Past Surgical History:  Procedure Laterality Date  . BRONCHIAL WASHINGS  09/12/2019   Procedure: BRONCHIAL WASHINGS;  Surgeon: Brand Males, MD;  Location: [facility];  Service:  Endoscopy;;  . VIDEO BRONCHOSCOPY N/A 09/12/2019   Procedure: VIDEO BRONCHOSCOPY WITHOUT FLUORO;  Surgeon: Brand Males, MD;  Location: [facility];  Service: Endoscopy;  Laterality: N/A;    Social History   Socioeconomic History  . Marital status: Married    Spouse name: Not on file  . Number of children: Not on file  . Years of education: Not on file  . Highest education level: Not on file  Occupational History  . Occupation: retired  Tobacco Use  . Smoking status: Former Smoker    Packs/day: 1.00    Years: 20.00    Pack years: 20.00    Types: Cigarettes    Quit date: 03/28/2009    Years since quitting: 10.7  . Smokeless tobacco: Never Used  . Tobacco comment: social smoker  Vaping Use  . Vaping Use: Never used  Substance and Sexual Activity  . Alcohol use: No  . Drug use: No  . Sexual activity: Not on file  Other Topics Concern  . Not on file  Social History Narrative  . Not on file   Social Determinants of Health   Financial Resource Strain:   . Difficulty of Paying Living Expenses: Not on file  Food Insecurity:   . Worried About Charity fundraiser in the Last Year: Not on file  . Ran Out of Food in the Last Year: Not on file  Transportation Needs:   . Film/video editor (Medical): Not on file  . Lack of Transportation (Non-Medical): Not on file  Physical Activity:   . Days of Exercise per Week: Not on file  . Minutes of Exercise per Session: Not on file  Stress:   . Feeling of Stress : Not on file  Social Connections:   . Frequency of Communication with Friends and Family: Not on file  . Frequency of Social Gatherings with Friends and Family: Not on file  . Attends Religious Services: Not on file  . Active Member of Clubs or Organizations: Not on file  . Attends Archivist Meetings: Not on file  . Marital Status: Not on file  Intimate Partner Violence:   . Fear of Current or Ex-Partner: Not on file  . Emotionally Abused: Not on file   . Physically Abused: Not on file  . Sexually Abused: Not on file    Allergies  Allergen Reactions  . Macrobid [employer] Macro] Other (See Comments)    Lowered potassium, caused aches and pains    History reviewed. No pertinent family history.  Prior to Admission medications   Medication Sig Start Date End Date Taking? Authorizing Provider  acetaminophen (TYLENOL) 500 MG tablet Take 500 mg by mouth every 6 (six) hours as needed for moderate pain.    [provider]  albuterol (VENTOLIN HFA) 108 (90 Base) MCG/ACT inhaler Inhale 1-2 puffs into the lungs every 6 (six) hours as needed for wheezing or shortness of breath. 12/10/19   Parrett, Fonnie Mu, NP  aspirin EC 81 MG tablet Take 1 tablet (81 mg total) by mouth daily. Swallow whole. 12/17/19   Burtis Junes, NP  cetirizine (ZYRTEC) 10 MG tablet Take 10 mg by mouth at bedtime as needed for allergies.     [provider]  Cholecalciferol (DIALYVITE VITAMIN D 5000) 125 MCG (5000 UT) capsule Take 5,000 Units by mouth daily.    [provider]  HYDROcodone-acetaminophen (NORCO/VICODIN) 5-325 MG tablet Take 1 tablet by mouth every 6 (six) hours as needed. 01/01/20   Horton, Barbette Hair, MD  ibuprofen (ADVIL) 200 MG tablet Take 200 mg by mouth every 6 (six) hours as needed (inflammation pain).     [provider]  inFLIXimab-abda (RENFLEXIS IV) Inject 1 ampule into the vein. Every two weeks, then once every six weeks    [provider]  KLOR-CON 10 10 MEQ tablet Take 10 mEq by mouth daily. 08/11/14   [provider]  levothyroxine (SYNTHROID) 50 MCG tablet Take 50 mcg by mouth daily.  08/16/18   [provider]  methotrexate (RHEUMATREX) 2.5 MG tablet Take 2.5 mg by mouth once a week. Caution:Chemotherapy. Protect from light. Patient takes six pills on Friday    [provider]  metoprolol tartrate (LOPRESSOR) 100 MG tablet Take 1 tablet (100 mg total) by mouth as  directed. Take 2 hours prior to CT scan 12/17/19 03/16/20  Burtis Junes, NP  mometasone-formoterol (DULERA) 200-5 MCG/ACT AERO Inhale 2 puffs into the lungs in the morning and at bedtime. 06/24/19   Martyn Ehrich, NP  ondansetron (ZOFRAN ODT) 4 MG disintegrating tablet Take 1 tablet (4 mg total) by mouth every 8 (eight) hours as needed for nausea or vomiting. 01/01/20   Horton, Barbette Hair, MD  rosuvastatin (CRESTOR) 5 MG tablet Take 5 mg by mouth daily.    [provider]  tamsulosin (FLOMAX) 0.4 MG CAPS capsule Take 1 capsule (0.4  mg total) by mouth daily. 01/01/20   Horton, Barbette Hair, MD  torsemide (DEMADEX) 20 MG tablet Take 1 tablet (20 mg total) by mouth daily. 05/15/19   Burtis Junes, NP  verapamil (CALAN-SR) 240 MG CR tablet Take 1 tablet (240 mg total) by mouth daily. 05/13/19   Burtis Junes, NP    Physical Exam: Vitals:   01/08/20 1530 01/08/20 1600 01/08/20 1630 01/08/20 1700  BP: 110/60 (!) 104/59 103/60 108/83  Pulse: 66 78 73 84  Resp:      Temp:      TempSrc:      SpO2: 95% 92% 91% 96%  Weight:      Height:         . General:  Appears anxious but otherwise in NAD . Eyes:  PERRL, EOMI, normal lids, iris . ENT:  grossly normal hearing, lips & tongue, mmm . Neck:  no LAD, masses or thyromegaly . Cardiovascular:  RRR, no m/r/g. No LE edema.  Marland Kitchen Respiratory:   CTA bilaterally with no wheezes/rales/rhonchi.  Normal respiratory effort. . Abdomen:  soft, NT, ND, NABS . Back:   normal alignment, no CVAT . Skin:  no rash or induration seen on limited exam . Musculoskeletal:  grossly normal tone BUE/BLE, good ROM, no bony abnormality . Psychiatric:  Anxious/emotionally labile mood and affect, speech fluent and appropriate, AOx3 . Neurologic:  CN 2-12 grossly intact, moves all extremities in coordinated fashion    Radiological Exams on Admission: DG Chest 2 View  Result Date: 01/08/2020 CLINICAL DATA:  Chest pain today. EXAM: CHEST - 2 VIEW COMPARISON:   PA and lateral chest 12/10/2019.  CT chest 07/22/2019. FINDINGS: Lungs clear. Heart size normal. No pneumothorax or pleural fluid. No acute or focal bony abnormality. IMPRESSION: Negative chest. Electronically Signed   By: Inge Rise M.D.   On: 01/08/2020 11:01   CARDIAC CATHETERIZATION  Result Date: 01/08/2020  Mid LAD lesion is 20% stenosed.  1st Diag lesion is 40% stenosed.  Ramus lesion is 70% stenosed.  1st Mrg lesion is 60% stenosed.  Prox RCA to Mid RCA lesion is 55% stenosed.  The left ventricular systolic function is normal.  LV end diastolic pressure is normal.  The left ventricular ejection fraction is 55-65% by visual estimate.  1. Nonobstructive CAD. There is modest disease in the mid RCA with normal DFR. Modest disease in the first OM. The ramus branch is tiny. 2. Normal LV function 3. Normal LVEDP. Plan: recommend continued medical therapy. Patient is a candidate for DC today.    EKG: Independently reviewed.  NSR with rate 79; nonspecific ST changes with no evidence of acute ischemia   Labs on Admission: I have personally reviewed the available labs and imaging studies at the time of the admission.  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 -Urgent need for cath indicates appropriate cardiology admission.   -  Will defer this issue further  to cardiology. -Continue ASA  HTN -Continue Demadex, Verapamil, Lopressor  RA -She is treated with Renflexis, methotrexate -I have reviewed this patient in the [place] Controlled Substances Reporting System.  She receives very irregular prescriptions for controlled substances. -She is not at particularly high risk of opioid misuse, diversion, or overdose.  HLD -Continue Crestor  Hypothyroidism -Check TSH -Continue Synthroid at current dose for now  Asthma/COPD -Continue Albuterol, Dulera  Anxiety -Clearly patient was anxious and labile at the ER -I gave her a small dose of Ativan to help relax her prior to the procedure -She is overwhelmed at home in caring for her chronically ill husband; palliative care has been consulted to offer assistance    Note: This patient has been tested and is negative for the novel coronavirus COVID-19. The patient has been fully vaccinated against COVID-19.   Thank you for this interesting consult.  [facility] will be happy to continue to consult on the patient should she need ongoing care post-catheterization.    Karmen Bongo MD [facility]   How to contact the [facility] Attending or Consulting provider [place] or covering provider during after hours [place], for this patient?  1. Check the care team in [facility] and look for a) attending/consulting [facility] provider listed and b) the [facility] team listed 2. Log into [email or web address] and use [place]'s universal password to access. If you do not have the password, please contact the hospital operator. 3. Locate the [facility] provider you are looking for under [facility] and page to a number that you can be directly reached. 4. If you still have difficulty reaching the provider, please page the [facility] (Director on Call) for the Hospitalists listed on amion for assistance.   01/08/2020, 5:46 PM

## 2020-01-08 NOTE — H&P (View-Only) (Signed)
Cardiology Consult:   Patient ID: Brandy Townsend; MRN: [id number]; DOB: [birth year]   Admission date: 01/08/2020  Primary Care Provider: Reynold Bowen, MD Primary Cardiologist: Jenkins Rouge, MD 05/10/2017 Tommas Olp, NP 12/17/2019 Primary Electrophysiologist: None    Chief Complaint:  Chest pain  Patient Profile:   Brandy Townsend is a 69 y.o. female with a history of palpitations, atyp CP w/ nl MV , asthma, RA, hx thyroid ablation, remote tobacco use, +vaccines for Covid. She came to the ER for CP, Cards asked to see by Dr Laverta Baltimore.  History of Present Illness:   [name] was seen in the office 09/21 and was continuing to have CP and SOB w/ exertion. A cardiac CT was ordered.  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  Cardiac catheterization is indicated.  Today, her chest pain became worse and it was associated with shortness of breath, dizziness and nausea.  When her symptoms did not resolve, he took aspirin 324 mg.  That was no help.  EMS was called.  EMS gave her Zofran 4 mg.  Upon arrival to the emergency room, she is having intermittent discomfort.  She has improved with Zofran.  She is struggling with several issues right now.  She has had recurrent kidney stones, and was in the ER on 10/6 because of those.  Her RA has been worse than usual and she got a steroid shot yesterday for that.  Although she is not wheezing, her dyspnea on exertion has been worse than usual.  She is compliant with pulmonary medications.  She is under an incredible amount of stress right now because she is the primary and almost only caregiver for her husband.  He had an esophagectomy to treat esophageal cancer.  His J-tube no longer works, so he is on TPN.  He is weak but is able to do some of his own ADLs.  She monitors him closely and manages the TPN most of the time, with [facility] RN assistance.  She has been doing this for several years and is feeling overwhelmed  and that she cannot continue to do this.    Past Medical History:  Diagnosis Date   Asthma    Graves disease    Hypertension    Osteoporosis    Rheumatoid arthritis ([place])    Thyroid disease    Vertigo     Past Surgical History:  Procedure Laterality Date   BRONCHIAL WASHINGS  09/12/2019   Procedure: BRONCHIAL WASHINGS;  Surgeon: Brand Males, MD;  Location: [facility];  Service: Endoscopy;;   VIDEO BRONCHOSCOPY N/A 09/12/2019   Procedure: VIDEO BRONCHOSCOPY WITHOUT FLUORO;  Surgeon: Brand Males, MD;  Location: [facility];  Service: Endoscopy;  Laterality: N/A;     Medications Prior to Admission: Prior to Admission medications   Medication Sig Start Date End Date Taking? Authorizing Provider  acetaminophen (TYLENOL) 500 MG tablet Take 500 mg by mouth every 6 (six) hours as needed for moderate pain.    [provider]  albuterol (VENTOLIN HFA) 108 (90 Base) MCG/ACT inhaler Inhale 1-2 puffs into the lungs every 6 (six) hours as needed for wheezing or shortness of breath. 12/10/19   Parrett, Fonnie Mu, NP  aspirin EC 81 MG tablet Take 1 tablet (81 mg total) by mouth daily. Swallow whole. 12/17/19   Burtis Junes, NP  cetirizine (ZYRTEC) 10 MG tablet Take 10 mg by mouth at bedtime as needed for allergies.  [provider]  Cholecalciferol (DIALYVITE VITAMIN D 5000) 125 MCG (5000 UT) capsule Take 5,000 Units by mouth daily.    [provider]  HYDROcodone-acetaminophen (NORCO/VICODIN) 5-325 MG tablet Take 1 tablet by mouth every 6 (six) hours as needed. 01/01/20   Horton, Barbette Hair, MD  ibuprofen (ADVIL) 200 MG tablet Take 200 mg by mouth every 6 (six) hours as needed (inflammation pain).     [provider]  inFLIXimab-abda (RENFLEXIS IV) Inject 1 ampule into the vein. Every two weeks, then once every six weeks    [provider]  KLOR-CON 10 10 MEQ tablet Take 10 mEq by mouth daily. 08/11/14   [provider]    levothyroxine (SYNTHROID) 50 MCG tablet Take 50 mcg by mouth daily.  08/16/18   [provider]  methotrexate (RHEUMATREX) 2.5 MG tablet Take 2.5 mg by mouth once a week. Caution:Chemotherapy. Protect from light. Patient takes six pills on Friday    [provider]  metoprolol tartrate (LOPRESSOR) 100 MG tablet Take 1 tablet (100 mg total) by mouth as directed. Take 2 hours prior to CT scan 12/17/19 03/16/20  Burtis Junes, NP  mometasone-formoterol (DULERA) 200-5 MCG/ACT AERO Inhale 2 puffs into the lungs in the morning and at bedtime. 06/24/19   Martyn Ehrich, NP  ondansetron (ZOFRAN ODT) 4 MG disintegrating tablet Take 1 tablet (4 mg total) by mouth every 8 (eight) hours as needed for nausea or vomiting. 01/01/20   Horton, Barbette Hair, MD  rosuvastatin (CRESTOR) 5 MG tablet Take 5 mg by mouth daily.    [provider]  tamsulosin (FLOMAX) 0.4 MG CAPS capsule Take 1 capsule (0.4 mg total) by mouth daily. 01/01/20   Horton, Barbette Hair, MD  torsemide (DEMADEX) 20 MG tablet Take 1 tablet (20 mg total) by mouth daily. 05/15/19   Burtis Junes, NP  verapamil (CALAN-SR) 240 MG CR tablet Take 1 tablet (240 mg total) by mouth daily. 05/13/19   Burtis Junes, NP     Allergies:    Allergies  Allergen Reactions   Macrobid [Nitrofurantoin Monohyd Macro] Other (See Comments)    Lowered potassium, caused aches and pains    Social History:   Social History   Socioeconomic History   Marital status: Married    Spouse name: Not on file   Number of children: Not on file   Years of education: Not on file   Highest education level: Not on file  Occupational History   Not on file  Tobacco Use   Smoking status: Former Smoker    Packs/day: 1.00    Years: 20.00    Pack years: 20.00    Types: Cigarettes    Quit date: 03/28/2009    Years since quitting: 10.7   Smokeless tobacco: Never Used   Tobacco comment: social smoker  Vaping Use   Vaping Use: Never used   Substance and Sexual Activity   Alcohol use: No   Drug use: No   Sexual activity: Not on file  Other Topics Concern   Not on file  Social History Narrative   Not on file   Social Determinants of Health   Financial Resource Strain:    Difficulty of Paying Living Expenses: Not on file  Food Insecurity:    Worried About Charity fundraiser in the Last Year: Not on file   [employer] of Food in the Last Year: Not on file  Transportation Needs:    Lack of Transportation (  Medical): Not on file   Lack of Transportation (Non-Medical): Not on file  Physical Activity:    Days of Exercise per Week: Not on file   Minutes of Exercise per Session: Not on file  Stress:    Feeling of Stress : Not on file  Social Connections:    Frequency of Communication with Friends and Family: Not on file   Frequency of Social Gatherings with Friends and Family: Not on file   Attends Religious Services: Not on file   Active Member of Clubs or Organizations: Not on file   Attends Archivist Meetings: Not on file   Marital Status: Not on file  Intimate Partner Violence:    Fear of Current or Ex-Partner: Not on file   Emotionally Abused: Not on file   Physically Abused: Not on file   Sexually Abused: Not on file    Family History:  The patient's family history is not on file.   The patient She indicated that her mother is deceased. She indicated that her father is deceased. She indicated that her brother is alive.    ROS:  Please see the history of present illness.  All other ROS reviewed and negative.     Physical Exam/Data:   Vitals:   01/08/20 1011  BP: 126/71  Pulse: 80  Resp: 17  Temp: 98.4 F (36.9 C)  TempSrc: Oral  SpO2: 97%  Weight: 66.7 kg  Height: 5\' 3"  (1.6 m)   No intake or output data in the 24 hours ending 01/08/20 1153 Filed Weights   01/08/20 1011  Weight: 66.7 kg   Body mass index is 26.04 kg/m.  General:  Well nourished, well developed, female in mild  distress HEENT: normal Lymph: no adenopathy Neck:  JVD not elevated Endocrine:  No thryomegaly Vascular: No carotid bruits; 4/4 extremity pulses 2+ bilaterally  Cardiac:  normal S1, S2; RRR; no murmur, no rub or gallop  Lungs:  clear to auscultation bilaterally, no wheezing, rhonchi or rales  Abd: soft, nontender, no hepatomegaly  Ext: no edema Musculoskeletal:  No deformities, BUE and BLE strength normal and equal Skin: warm and dry  Neuro:  CNs 2-12 intact, no focal abnormalities noted Psych:  Normal affect    EKG:  The ECG was personally reviewed: SR, HR 79, no acute ischemic changes Telemetry: SR  Relevant CV Studies:  Myoview Study Highlights 05/14/2019   The left ventricular ejection fraction is hyperdynamic (>65%). Nuclear stress EF: 77%. Horizontal ST segment depression ST segment depression of 1 mm was noted during stress in the II, III and aVF leads, and returning to baseline after 5-9 minutes of recovery. The study is normal. This is a low risk study. No change from prior study.      ECHO IMPRESSIONS 05/14/2019   1. Left ventricular ejection fraction, by estimation, is 55 to 60%. The  left ventricle has normal function. The left ventricle has no regional  wall motion abnormalities. Left ventricular diastolic parameters were  normal.   2. Right ventricular systolic function is normal. The right ventricular  size is normal. There is normal pulmonary artery systolic pressure. The  estimated right ventricular systolic pressure is 76.1 mmHg.   3. The mitral valve is grossly normal. No evidence of mitral valve  regurgitation.   4. The aortic valve is tricuspid. Aortic valve regurgitation is not  visualized. No aortic stenosis is present.   5. The inferior vena cava is normal in size with greater than 50%  respiratory variability, suggesting right atrial pressure of 3 mmHg.    Laboratory Data:  Chemistry Recent Labs  Lab 01/08/20 1014  NA 141  K 3.4*  CL 102   CO2 25  GLUCOSE 113*  BUN 13  CREATININE 0.89  CALCIUM 9.7  GFRNONAA >60  ANIONGAP 14    No results for input(s): PROT, ALBUMIN, AST, ALT, ALKPHOS, BILITOT in the last 168 hours. Hematology Recent Labs  Lab 01/08/20 1014  WBC 8.4  RBC 4.60  HGB 13.6  HCT 42.7  MCV 92.8  MCH 29.6  MCHC 31.9  RDW 13.8  PLT 276   Cardiac Enzymes  High Sensitivity Troponin:   Recent Labs  Lab 01/08/20 1014  TROPONINIHS 3     BNPNo results for input(s): BNP, PROBNP in the last 168 hours.  DDimer No results for input(s): DDIMER in the last 168 hours. Lipids:  Lab Results  Component Value Date   CHOL 180 05/10/2017   HDL 75 05/10/2017   LDLCALC 93 05/10/2017   TRIG 60 05/10/2017   CHOLHDL 2.4 05/10/2017   INR: No results found for: INR, PROTIME A1c: No results found for: HGBA1C Thyroid:  Lab Results  Component Value Date   TSH 1.735 05/08/2019    Radiology/Studies:  DG Chest 2 View  Result Date: 01/08/2020 CLINICAL DATA:  Chest pain today. EXAM: CHEST - 2 VIEW COMPARISON:  PA and lateral chest 12/10/2019.  CT chest 07/22/2019. FINDINGS: Lungs clear. Heart size normal. No pneumothorax or pleural fluid. No acute or focal bony abnormality. IMPRESSION: Negative chest. Electronically Signed   By: Inge Rise M.D.   On: 01/08/2020 11:01    Assessment and Plan:   1. Chest pain - initial trop negative, ECG not acute - cardiac CT showed probable significant dz in RCA, OM1 - cardiac cath is indicated - Cardiac catheterization was discussed with the patient fully. The patient understands that risks include but are not limited to stroke (1 in 1000), death (1 in 80), kidney failure [usually temporary] (1 in 500), bleeding (1 in 200), allergic reaction [possibly serious] (1 in 200).  The patient understands and is willing to proceed.    Otherwise, continue home rx Palliative Care was contacted, they will see to let her know how they can help w/ her husband.   Active Problems:    Nonspecific chest pain   Anxiety     For questions or updates, please contact [facility] Please consult [email or web address] for contact info under Cardiology/STEMI.    Signed, Rosaria Ferries, PA-C  01/08/2020 11:53 AM   Patient examined chart reviewed Discussed care with patient, son and PA. Exam with anxious teary eyed bronchitic female Mild exp wheezing Normal JVP no murmur soft abdomen no edema good right radial pulse. Pain and dyspnea are atypical and may be related to her RA ( recent steroid shot) COPD and she has also had two recent kidney stones. Her husband has esophageal cancer with J tube and PIC line and she is overwhelmed with his car as she does his TPN and feedings and he cannot drive. Spoke with son Aaron Edelman in [place] and brother is driving down from [place] . Given her cardiac CTA with FFR CT showing likely hemodynamically significant stenosis in mid/distal RCA and Diagonal will try to cath today. Risks including stroke, bleeding , MI, emergency surgery and renal failure discussed She agrees to proceed. She has had contrast with her cardiac CTA with no issues. Has a good right radial pulse for access  Jenkins Rouge MD [facility]

## 2020-01-08 NOTE — Social Work (Signed)
CSW met with Pt at bedside. CSW provided supportive listening. Pt reports several life stressors including husband's medical issues as well as Pt's own recent medical diagnoses.  Pt does have several supports including her son and her brother as well as several close friends.  Pt also states that she engages in self-care such as exercise (walking and seeing a trainer) Pt reports that upon arrival at hospital she was very upset and overwhelmed but has since discovered that her medical condition will not require the extensive care initially expected which gave her a great deal of relief. CSW and Pt discussed utilizing Pt's PCP to connect with an outpatient counselor to further support Pt's emotional and psychological well-being. CSW and Pt discussed varying methods of self-care and the importance of prioritizing such care when caring for a loved one.

## 2020-01-08 NOTE — Discharge Summary (Signed)
Discharge Summary    Patient ID: Brandy Townsend MRN: [id number]; DOB: September 15, 1950  Admit date: 01/08/2020 Discharge date: 01/08/2020  Primary Care Provider: Reynold Bowen, MD  Primary Cardiologist: Jenkins Rouge, MD  Primary Electrophysiologist:  None   Discharge Diagnoses    Principal Problem:   Nonspecific chest pain Active Problems:   Anxiety   Allergies Allergies  Allergen Reactions  . Macrobid [employer] Macro] Other (See Comments)    Lowered potassium, caused aches and pains    Diagnostic Studies/Procedures    DG Chest 2 View  Result Date: 01/08/2020 CLINICAL DATA:  Chest pain today. EXAM: CHEST - 2 VIEW COMPARISON:  PA and lateral chest 12/10/2019.  CT chest 07/22/2019. FINDINGS: Lungs clear. Heart size normal. No pneumothorax or pleural fluid. No acute or focal bony abnormality. IMPRESSION: Negative chest. Electronically Signed   By: Inge Rise M.D.   On: 01/08/2020 11:01   CARDIAC CATHETERIZATION  Result Date: 01/08/2020  Mid LAD lesion is 20% stenosed.  1st Diag lesion is 40% stenosed.  Ramus lesion is 70% stenosed.  1st Mrg lesion is 60% stenosed.  Prox RCA to Mid RCA lesion is 55% stenosed.  The left ventricular systolic function is normal.  LV end diastolic pressure is normal.  The left ventricular ejection fraction is 55-65% by visual estimate.  1. Nonobstructive CAD. There is modest disease in the mid RCA with normal DFR. Modest disease in the first OM. The ramus branch is tiny. 2. Normal LV function 3. Normal LVEDP. Plan: recommend continued medical therapy. Patient is a candidate for DC today.   _____________   History of Present Illness     Brandy Townsend is a 69 y.o. female with a history of palpitations, atyp CP w/ nl MV , asthma, RA, hx thyroid ablation, remote tobacco use, +vaccines for Covid. She came to the ER for CP, Cards asked to see.  Hospital Course     Consultants: IM   Pt sx concerning for CAD, recent cardiac CT  w/ lesions that were + by FFR. Cardiac cath indicated for definitive answer. Pt taken to the cath lab on 10/13.   Cath results above. Med rx for mild-mod dz. Dr Johnsie Cancel reviewed the results and made some med changes. She will be on Imdur and Coreg, d/c verapamil. F/u appt made.   Brandy Townsend is also under tremendous stress from caring for her husband, who has esophageal cancer. He is on TPN and very weak. Social Work and [place] were asked to speak with her and give her options.   No futher inpatient workup is indicated and she is considered stable for discharge, to follow up as an outpt.   Did the patient have an acute coronary syndrome (MI, NSTEMI, STEMI, etc) this admission?:  No                               Did the patient have a percutaneous coronary intervention (stent / angioplasty)?:  No.   _____________  Discharge Vitals Blood pressure 102/80, pulse 78, temperature 98.4 F (36.9 C), temperature source Oral, resp. rate (!) 0, height 5\' 3"  (1.6 m), weight 66.7 kg, SpO2 95 %.  Filed Weights   01/08/20 1011  Weight: 66.7 kg    Labs & Radiologic Studies    CBC Recent Labs    01/08/20 1014  WBC 8.4  HGB 13.6  HCT 42.7  MCV 92.8  PLT 276  Basic Metabolic Panel Recent Labs    01/08/20 1014  NA 141  K 3.4*  CL 102  CO2 25  GLUCOSE 113*  BUN 13  CREATININE 0.89  CALCIUM 9.7   Liver Function Tests No results for input(s): AST, ALT, ALKPHOS, BILITOT, PROT, ALBUMIN in the last 72 hours. No results for input(s): LIPASE, AMYLASE in the last 72 hours. High Sensitivity Troponin:   Recent Labs  Lab 01/08/20 1014 01/08/20 1249  TROPONINIHS 3 3    BNP Invalid input(s): POCBNP D-Dimer No results for input(s): DDIMER in the last 72 hours. Hemoglobin A1C No results for input(s): HGBA1C in the last 72 hours. Fasting Lipid Panel No results for input(s): CHOL, HDL, LDLCALC, TRIG, CHOLHDL, LDLDIRECT in the last 72 hours. Thyroid Function Tests No results for  input(s): TSH, T4TOTAL, T3FREE, THYROIDAB in the last 72 hours.  Invalid input(s): FREET3 _____________  DG Chest 2 View  Result Date: 01/08/2020 CLINICAL DATA:  Chest pain today. EXAM: CHEST - 2 VIEW COMPARISON:  PA and lateral chest 12/10/2019.  CT chest 07/22/2019. FINDINGS: Lungs clear. Heart size normal. No pneumothorax or pleural fluid. No acute or focal bony abnormality. IMPRESSION: Negative chest. Electronically Signed   By: Inge Rise M.D.   On: 01/08/2020 11:01   DG Chest 2 View  Result Date: 12/10/2019 CLINICAL DATA:  69 year old female with history of cough. History of COPD. EXAM: CHEST - 2 VIEW COMPARISON:  Chest x-ray 05/08/2019. FINDINGS: Diffuse peribronchial cuffing. Lung volumes are normal. No consolidative airspace disease. No pleural effusions. No pneumothorax. No pulmonary nodule or mass noted. Pulmonary vasculature and the cardiomediastinal silhouette are within normal limits. Atherosclerosis in the thoracic aorta. IMPRESSION: 1. Mild diffuse peribronchial cuffing, concerning for an acute bronchitis. 2. Aortic atherosclerosis. Electronically Signed   By: Vinnie Langton M.D.   On: 12/10/2019 12:46   DG Abdomen 1 View  Result Date: 01/01/2020 CLINICAL DATA:  Right flank pain.  History of kidney stones. EXAM: ABDOMEN - 1 VIEW COMPARISON:  CT scan 07/18/2019 FINDINGS: There is no evidence for gaseous bowel dilation to suggest obstruction. No unexpected abdominopelvic calcification. Convex rightward lumbar scoliosis associated with diffuse degenerative changes in the lumbar spine. IMPRESSION: Nonspecific bowel gas pattern. Electronically Signed   By: Misty Stanley M.D.   On: 01/01/2020 06:26   CARDIAC CATHETERIZATION  Result Date: 01/08/2020  Mid LAD lesion is 20% stenosed.  1st Diag lesion is 40% stenosed.  Ramus lesion is 70% stenosed.  1st Mrg lesion is 60% stenosed.  Prox RCA to Mid RCA lesion is 55% stenosed.  The left ventricular systolic function is normal.   LV end diastolic pressure is normal.  The left ventricular ejection fraction is 55-65% by visual estimate.  1. Nonobstructive CAD. There is modest disease in the mid RCA with normal DFR. Modest disease in the first OM. The ramus branch is tiny. 2. Normal LV function 3. Normal LVEDP. Plan: recommend continued medical therapy. Patient is a candidate for DC today.   CT CORONARY MORPH W/CTA COR W/SCORE W/CA W/CM &/OR WO/CM  Addendum Date: 01/06/2020   ADDENDUM REPORT: 01/06/2020 11:01 CLINICAL DATA:  Chest pain EXAM: Cardiac CTA MEDICATIONS: Sub lingual nitro. 4 mg and lopressor 100mg  oral TECHNIQUE: The patient was scanned on a [employer] 192 scanner. Gantry rotation speed was 250 msecs. Collimation was. 6 mm . A 120 kV prospective scan was triggered in the ascending thoracic aorta at 140 HU's with full mA between 30-70% of the R-R interval . Average HR  during the scan was 59 bpm. The 3D data set was interpreted on a dedicated work station using MPR, MIP and VRT modes. A total of 80 cc of contrast was used. FINDINGS: Non-cardiac: See separate report from [facility]. No significant findings on limited lung and soft tissue windows. Calcium score: Significant 3 vessel coronary calcium noted Coronary Arteries: Right dominant with no anomalies LM: Normal LAD: 1-24% calcified plaque proximally 40-59% calcified plaque in mid vessel D1: Small vessel calcified plaque poorly visualized D2: 40-59% calcified plaque distal vessel small poorly visualized Circumflex: 40-59% ostial calcified plaque OM1: 60-79% calcified plaque RCA: 1-24% calcified plaque proximally 40-59% calcified plaque mid vessel 60-79% calcified plaque distally near crux. 40-59% calcified plaque PDA/PLB IMPRESSION: 1. Calcium score 790 which is 28 th percentile for age and sex and involves all 3 major epicardial vessels 2.  Normal aortic root 2.9 cm 3. CAD RADS 3 possible obstructive CAD tightest lesions appear to be in mid/distal RCA and OM1  Study sent for [facility] CT Jenkins Rouge Electronically Signed   By: Jenkins Rouge M.D.   On: 01/06/2020 11:01   Result Date: 01/06/2020 EXAM: OVER-READ INTERPRETATION  CT CHEST The following report is an over-read performed by radiologist Dr. Vinnie Langton of [facility], [place] on 01/06/2020. This over-read does not include interpretation of cardiac or coronary anatomy or pathology. The coronary calcium score/coronary CTA interpretation by the cardiologist is attached. COMPARISON:  None. FINDINGS: Aortic atherosclerosis. Within the visualized portions of the thorax there are no suspicious appearing pulmonary nodules or masses, there is no acute consolidative airspace disease, no pleural effusions, no pneumothorax and no lymphadenopathy. Visualized portions of the upper abdomen are unremarkable. There are no aggressive appearing lytic or blastic lesions noted in the visualized portions of the skeleton. IMPRESSION: 1.  Aortic Atherosclerosis (ICD10-I70.0). Electronically Signed: By: Vinnie Langton M.D. On: 01/06/2020 09:34   DG BONE DENSITY (DXA)  Result Date: 12/16/2019 EXAM: DUAL X-RAY ABSORPTIOMETRY (DXA) FOR BONE MINERAL DENSITY IMPRESSION: Referring Physician:  Rosita Kea Your patient completed a BMD test using Lunar IDXA DXA system ( analysis version: 16 ) manufactured by [employer]. Technologist: AW PATIENT: Name: Brandy Townsend, Brandy Townsend Patient ID: [id number] Birth Date: 16-Jun-1950 Height: 62.8 in. Sex: Female Measured: 12/16/2019 Weight: 150.4 lbs. Indications: Estrogen Deficient, Hypothyroid, Levothyroxine, Postmenopausal, Rheumatoid Arthritis (714.0) Fractures: None Treatments: Vitamin D (E933.5) ASSESSMENT: The BMD measured at Femur Neck Right is 0.663 g/cm2 with a T-score of -2.7. This patient is considered osteoporotic according to [facility]) criteria. The scan quality is good. Lumbar spine was not utilized due to advanced degenerative changes. Site Region Measured Date  Measured Age YA BMD Significant CHANGE T-score DualFemur Neck Right 12/16/2019 67.4 -2.9 0.663 g/cm2 DualFemur Total Mean 12/16/2019 67.4 -1.8 0.778 g/cm2 Left Forearm Radius 33% 12/16/2019 67.4 -2.6 0.661 g/cm2 World Health Organization [facility]) criteria for post-menopausal, Caucasian Women: Normal       T-score at or above -1 SD Osteopenia   T-score between -1 and -2.5 SD Osteoporosis T-score at or below -2.5 SD RECOMMENDATION: 1. All patients should optimize calcium and vitamin D intake. 2. Consider FDA approved medical therapies in postmenopausal women and men aged 41 years and older, based on the following: a. A hip or vertebral (clinical or morphometric) fracture b. T- score < or = -2.5 at the femoral neck or spine after appropriate evaluation to exclude secondary causes c. Low bone mass (T-score between -1.0 and -2.5 at the femoral neck or spine) and a  10 year probability of a hip fracture > or = 3% or a 10 year probability of a major osteoporosis-related fracture > or = 20% based on the [place]-adapted WHO algorithm d. Clinician judgment and/or patient preferences may indicate treatment for people with 10-year fracture probabilities above or below these levels FOLLOW-UP: Patients with diagnosis of osteoporosis or at high risk for fracture should have regular bone mineral density tests. For patients eligible for Medicare, routine testing is allowed once every 2 years. The testing frequency can be increased to one year for patients who have rapidly progressing disease, those who are receiving or discontinuing medical therapy to restore bone mass, or have additional risk factors. I have reviewed this report and agree with the above findings. [facility] Electronically Signed   By: Lowella Grip III M.D.   On: 12/16/2019 09:33   CT CORONARY FRACTIONAL FLOW RESERVE DATA PREP  Result Date: 01/08/2020 CLINICAL DATA:  CAD EXAM: FFR CT TECHNIQUE: The best systolic and diastolic phases of the patients gated  cardiac CTA sent to [facility] for hemodynamic analysis FINDINGS: FFR CT abnormal in the mid/distal RCA 0.80 and 0.79 in the first large diagonal FFR CT borderline in mid/distal LAD 0.87 IMPRESSION: Abnormal FFR CT suggesting hemodynamically significant stenosis in RCA and Diagonal Patient will be referred for heart catheterization Jenkins Rouge Electronically Signed   By: Jenkins Rouge M.D.   On: 01/08/2020 11:06   Disposition   Pt is being discharged home today in improved condition.  Follow-up Plans & Appointments     Follow-up Information    Burtis Junes, NP Follow up on 01/20/2020.   Specialties: Nurse Practitioner, Interventional Cardiology, Cardiology, Radiology Why: Keep 11:45 am appt. Contact information: [place]. [address] [place] [address] [phone]              Discharge Instructions    Call MD for:  redness, tenderness, or signs of infection (pain, swelling, redness, odor or green/yellow discharge around incision site)   Complete by: As directed    Diet - low sodium heart healthy   Complete by: As directed    Increase activity slowly   Complete by: As directed       Discharge Medications   Allergies as of 01/08/2020      Reactions   Macrobid [nitrofurantoin Monohyd Macro] Other (See Comments)   Lowered potassium, caused aches and pains      Medication List    STOP taking these medications   HYDROcodone-acetaminophen 5-325 MG tablet Commonly known as: NORCO/VICODIN   metoprolol tartrate 100 MG tablet Commonly known as: LOPRESSOR   verapamil 240 MG CR tablet Commonly known as: CALAN-SR     TAKE these medications   acetaminophen 500 MG tablet Commonly known as: TYLENOL Take 500 mg by mouth every 6 (six) hours as needed for moderate pain.   albuterol 108 (90 Base) MCG/ACT inhaler Commonly known as: VENTOLIN HFA Inhale 1-2 puffs into the lungs every 6 (six) hours as needed for wheezing or shortness of breath.   alendronate 70  MG tablet Commonly known as: FOSAMAX Take 70 mg by mouth once a week.   aspirin EC 81 MG tablet Take 1 tablet (81 mg total) by mouth daily. Swallow whole.   carvedilol 3.125 MG tablet Commonly known as: Coreg Take 1 tablet (3.125 mg total) by mouth 2 (two) times daily.   cetirizine 10 MG tablet Commonly known as: ZYRTEC Take 10 mg by mouth at bedtime as needed for allergies.  Dulera 200-5 MCG/ACT Aero Generic drug: mometasone-formoterol Inhale 2 puffs into the lungs in the morning and at bedtime.   folic acid 1 MG tablet Commonly known as: FOLVITE Take 3 mg by mouth daily.   ibuprofen 200 MG tablet Commonly known as: ADVIL Take 200 mg by mouth every 6 (six) hours as needed for mild pain (inflammation pain).   isosorbide mononitrate 30 MG 24 hr tablet Commonly known as: IMDUR Take 0.5 tablets (15 mg total) by mouth daily.   Klor-Con 10 10 MEQ tablet Generic drug: potassium chloride Take 10 mEq by mouth daily.   levothyroxine 50 MCG tablet Commonly known as: SYNTHROID Take 50 mcg by mouth daily.   methotrexate 2.5 MG tablet Commonly known as: RHEUMATREX Take 15 mg by mouth once a week. Monday evening   ondansetron 4 MG disintegrating tablet Commonly known as: Zofran ODT Take 1 tablet (4 mg total) by mouth every 8 (eight) hours as needed for nausea or vomiting.   pyridOXINE 100 MG tablet Commonly known as: VITAMIN B-6 Take 100 mg by mouth daily.   RENFLEXIS IV Inject 1 ampule into the vein once a week. Every two weeks, then once every six weeks   rosuvastatin 5 MG tablet Commonly known as: CRESTOR Take 5 mg by mouth daily.   tamsulosin 0.4 MG Caps capsule Commonly known as: Flomax Take 1 capsule (0.4 mg total) by mouth daily.   torsemide 20 MG tablet Commonly known as: DEMADEX Take 1 tablet (20 mg total) by mouth daily.   Vitamin D (Ergocalciferol) 1.25 MG (50000 UNIT) Caps capsule Commonly known as: DRISDOL Take 50,000 Units by mouth every 7 (seven)  days. Monday          Outstanding Labs/Studies   A1c, TSH, hepatic function panel, HIV test.   Duration of Discharge Encounter   Greater than 30 minutes including physician time.  Signed, Rosaria Ferries, PA-C 01/08/2020, 3:30 PM

## 2020-01-08 NOTE — Progress Notes (Deleted)
CARDIOLOGY OFFICE NOTE  Date:  01/08/2020    Brandy Townsend Date of Birth: 1950-04-23 Medical Record #[id number]  PCP:  Reynold Bowen, MD  Cardiologist:  Gillian Shields   No chief complaint on file.   History of Present Illness: Brandy Townsend is a 69 y.o. female who presents today for a work in visit. Seen for Dr. Johnsie Cancel.   She has a history of palpitations, atypical chest pain with prior normal Myoview, asthma, RA, former smoker, pulmonary infiltrates and prior thyroid ablation.   Last seen by Dr. Johnsie Cancel in February of 2019.  I saw her back in February - she had been to the ER with SOB/cough. She had had both COVID vaccines. She has had issues with asthma/bronchitis since December along with trouble getting BP down. She had noted that her HR and BP were elevated - also endorsed chest pain - she had increased her CCB which I continued - echo and stress testing was arranged and were reassuring. Lots of stress at home - caring for her husband who has esophageal cancer. She was considering seeing pulmonary again (Dr. Melvyn Novas).  On follow up visit by telehealth visit in March - she was doing well. BP was good. Cardiac status looked good.   She has since gotten back with pulmonary - had had abnormal CT scan. Had bronchoscopy which was ok. This scan also noted aortic atherosclerosis.   I last saw her a few weeks ago - she was coughing again - back on several therapies. Still with some chest pain and SOB - worrisome to her - especially with her FH. We got a coronary CT - see below.    Comes in today. Here with   Past Medical History:  Diagnosis Date  . Asthma   . Graves disease   . Hypertension   . Osteoporosis   . Rheumatoid arthritis ([place])   . Thyroid disease   . Vertigo     Past Surgical History:  Procedure Laterality Date  . BRONCHIAL WASHINGS  09/12/2019   Procedure: BRONCHIAL WASHINGS;  Surgeon: Brand Males, MD;  Location: [facility] ENDOSCOPY;  Service:  Endoscopy;;  . VIDEO BRONCHOSCOPY N/A 09/12/2019   Procedure: VIDEO BRONCHOSCOPY WITHOUT FLUORO;  Surgeon: Brand Males, MD;  Location: [facility] ENDOSCOPY;  Service: Endoscopy;  Laterality: N/A;     Medications: No outpatient medications have been marked as taking for the 01/08/20 encounter (Appointment) with Burtis Junes, NP.     Allergies: Allergies  Allergen Reactions  . Macrobid [employer] Macro] Other (See Comments)    Lowered potassium, caused aches and pains    Social History: The patient  reports that she quit smoking about 10 years ago. Her smoking use included cigarettes. She has a 20.00 pack-year smoking history. She has never used smokeless tobacco. She reports that she does not drink alcohol and does not use drugs.   Family History: The patient's ***family history is not on file.   Review of Systems: Please see the history of present illness.   All other systems are reviewed and negative.   Physical Exam: VS:  There were no vitals taken for this visit. [place] Kitchen  BMI There is no height or weight on file to calculate BMI.  Wt Readings from Last 3 Encounters:  12/17/19 148 lb (67.1 kg)  12/10/19 152 lb (68.9 kg)  10/17/19 151 lb 3.2 oz (68.6 kg)    General: Pleasant. Well developed, well nourished and in no acute distress.  HEENT: Normal.  Neck: Supple, no JVD, carotid bruits, or masses noted.  Cardiac: ***Regular rate and rhythm. No murmurs, rubs, or gallops. No edema.  Respiratory:  Lungs are clear to auscultation bilaterally with normal work of breathing.  GI: Soft and nontender.  MS: No deformity or atrophy. Gait and ROM intact.  Skin: Warm and dry. Color is normal.  Neuro:  Strength and sensation are intact and no gross focal deficits noted.  Psych: Alert, appropriate and with normal affect.   LABORATORY DATA:  EKG:  EKG {ACTION; IS/IS XTG:[id number]} ordered today.  Personally reviewed by me. This demonstrates ***.  Lab Results  Component  Value Date   WBC 5.6 01/01/2020   HGB 13.0 01/01/2020   HCT 38.9 01/01/2020   PLT 247 01/01/2020   GLUCOSE 102 (H) 01/01/2020   CHOL 180 05/10/2017   TRIG 60 05/10/2017   HDL 75 05/10/2017   LDLCALC 93 05/10/2017   ALT 24 07/27/2019   AST 18 07/27/2019   NA 141 01/01/2020   K 3.6 01/01/2020   CL 106 01/01/2020   CREATININE 0.70 01/01/2020   BUN 16 01/01/2020   CO2 25 01/01/2020   TSH 1.735 05/08/2019         BNP (last 3 results) No results for input(s): BNP in the last 8760 hours.  ProBNP (last 3 results) No results for input(s): PROBNP in the last 8760 hours.   Other Studies Reviewed Today:  CORONARY CT IMPRESSION 01/06/2020: 1. Calcium score 790 which is 33 th percentile for age and sex and involves all 3 major epicardial vessels  2.  Normal aortic root 2.9 cm  3. CAD RADS 3 possible obstructive CAD tightest lesions appear to be in mid/distal RCA and OM1 Study sent for FFR CT  FFR CT was positive in the mid/distal RCA 0.80 and diagonal 0.79 please have Cecille Rubin see in office and set up for cath add beta blockers and nitrates if not already on        [facility]2/2021   The left ventricular ejection fraction is hyperdynamic (>65%).  Nuclear stress EF: 77%.  Horizontal ST segment depression ST segment depression of 1 mm was noted during stress in the II, III and aVF leads, and returning to baseline after 5-9 minutes of recovery.  The study is normal.  This is a low risk study.  No change from prior study.    ECHOIMPRESSIONS2/2021  1. Left ventricular ejection fraction, by estimation, is 55 to 60%. The  left ventricle has normal function. The left ventricle has no regional  wall motion abnormalities. Left ventricular diastolic parameters were  normal.  2. Right ventricular systolic function is normal. The right ventricular  size is normal. There is normal pulmonary artery systolic pressure. The  estimated  right ventricular systolic pressure is 62.7 mmHg.  3. The mitral valve is grossly normal. No evidence of mitral valve  regurgitation.  4. The aortic valve is tricuspid. Aortic valve regurgitation is not  visualized. No aortic stenosis is present.  5. The inferior vena cava is normal in size with greater than 50%  respiratory variability, suggesting right atrial pressure of 3 mmHg.    ASSESSMENT &PLAN:    1.Chest pain/DOE - prior Myoview was low risk - she remains concerned - has multiple CV risk factors along with known aortic atherosclerosis - will arrange for coronary CT with FFR to further evaluate.   2. HTN - BP is up here today - she reports great  control at home - would favor having her continue to follow.   3. Recurrent cough - recent bout of antibiotics and steroid shot with improvement.   4. RA - on therapy. There is concern that her most recent flare from earlier this year was related to COVID vaccine.   5. HLD - on statin - will get labs from PCP.   6. Former smoker - has had some issues with prior asthma/bronchitis/sinusitis - recent round of therapy - now improved - cough is resolved. No wheezing on exam.   7. +FH for CAD - needs aggressive CV risk factor modification.   8. RA - plan per rheumatology  7. Pulmonary infiltrates - prior bronchoscopy noted. Followed by pulmonary.      Current medicines are reviewed with the patient today.  The patient does not have concerns regarding medicines other than what has been noted above.  The following changes have been made:  See above.  Labs/ tests ordered today include:   No orders of the defined types were placed in this encounter.    Disposition:   FU with *** in {gen number 5-75:[id number]} {Days to years:10300}.   Patient is agreeable to this plan and will call if any problems develop in the interim.   SignedTruitt Merle, NP  01/08/2020 8:23 AM  [facility] [address] [place], [place]  [address] Phone: [phone] Fax: [phone]

## 2020-01-08 NOTE — Consult Note (Addendum)
Cardiology Consult:   Brandy Townsend ID: Brandy Brandy Townsend; MRN: [id number]; DOB: 1951-01-08   Admission date: 01/08/2020  Primary Care Provider: Reynold Bowen, MD Primary Cardiologist: Jenkins Rouge, MD 05/10/2017 Tommas Olp, NP 12/17/2019 Primary Electrophysiologist: None    Chief Complaint:  Chest pain  Brandy Townsend Profile:   Brandy Brandy Townsend is a 69 y.o. female with a history of palpitations, atyp CP w/ nl MV , asthma, RA, hx thyroid ablation, remote tobacco use, +vaccines for Covid. She came to Brandy ER for CP, Cards asked to see by Dr Laverta [place].  History of Present Illness:   Brandy Brandy Townsend was seen in Brandy office 09/21 and was continuing to have CP and SOB w/ exertion. A cardiac CT was ordered.  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  Cardiac catheterization is indicated.  Today, her chest pain became worse and it was associated with shortness of breath, dizziness and nausea.  When her symptoms did not resolve, he took aspirin 324 mg.  That was no help.  EMS was called.  EMS gave her Zofran 4 mg.  Upon arrival to Brandy emergency room, she is having intermittent discomfort.  She has improved with Zofran.  She is struggling with several issues right now.  She has had recurrent kidney stones, and was in Brandy ER on 10/6 because of those.  Her RA has been worse than usual and she got a steroid shot yesterday for that.  Although she is not wheezing, her dyspnea on exertion has been worse than usual.  She is compliant with pulmonary medications.  She is under an incredible amount of stress right now because she is Brandy primary and almost only caregiver for her husband.  He had an esophagectomy to treat esophageal cancer.  His J-tube no longer works, so he is on TPN.  He is weak but is able to do some of his own ADLs.  She monitors him closely and manages Brandy TPN most of Brandy time, with [facility] RN assistance.  She has been doing this for several years and is feeling overwhelmed  and that she cannot continue to do this.    Past Medical History:  Diagnosis Date   Asthma    Graves disease    Hypertension    Osteoporosis    Rheumatoid arthritis ([place])    Thyroid disease    Vertigo     Past Surgical History:  Procedure Laterality Date   BRONCHIAL WASHINGS  09/12/2019   Procedure: BRONCHIAL WASHINGS;  Surgeon: Brand Males, MD;  Location: [facility];  Service: Endoscopy;;   VIDEO BRONCHOSCOPY N/A 09/12/2019   Procedure: VIDEO BRONCHOSCOPY WITHOUT FLUORO;  Surgeon: Brand Males, MD;  Location: [facility];  Service: Endoscopy;  Laterality: N/A;     Medications Prior to Admission: Prior to Admission medications   Medication Sig Start Date End Date Taking? Authorizing Provider  acetaminophen (TYLENOL) 500 MG tablet Take 500 mg by mouth every 6 (six) hours as needed for moderate pain.    [provider]  albuterol (VENTOLIN HFA) 108 (90 Base) MCG/ACT inhaler Inhale 1-2 puffs into Brandy lungs every 6 (six) hours as needed for wheezing or shortness of breath. 12/10/19   Parrett, Fonnie Mu, NP  aspirin EC 81 MG tablet Take 1 tablet (81 mg total) by mouth daily. Swallow whole. 12/17/19   Burtis Junes, NP  cetirizine (ZYRTEC) 10 MG tablet Take 10 mg by mouth at bedtime as needed for allergies.  [provider]  Cholecalciferol (DIALYVITE VITAMIN D 5000) 125 MCG (5000 UT) capsule Take 5,000 Units by mouth daily.    [provider]  HYDROcodone-acetaminophen (NORCO/VICODIN) 5-325 MG tablet Take 1 tablet by mouth every 6 (six) hours as needed. 01/01/20   Horton, Barbette Hair, MD  ibuprofen (ADVIL) 200 MG tablet Take 200 mg by mouth every 6 (six) hours as needed (inflammation pain).     [provider]  inFLIXimab-abda (RENFLEXIS IV) Inject 1 ampule into Brandy vein. Every two weeks, then once every six weeks    [provider]  KLOR-CON 10 10 MEQ tablet Take 10 mEq by mouth daily. 08/11/14   [provider]   levothyroxine (SYNTHROID) 50 MCG tablet Take 50 mcg by mouth daily.  08/16/18   [provider]  methotrexate (RHEUMATREX) 2.5 MG tablet Take 2.5 mg by mouth once a week. Caution:Chemotherapy. Protect from light. Brandy Townsend takes six pills on Friday    [provider]  metoprolol tartrate (LOPRESSOR) 100 MG tablet Take 1 tablet (100 mg total) by mouth as directed. Take 2 hours prior to CT scan 12/17/19 03/16/20  Burtis Junes, NP  mometasone-formoterol (DULERA) 200-5 MCG/ACT AERO Inhale 2 puffs into Brandy lungs in Brandy morning and at bedtime. 06/24/19   Martyn Ehrich, NP  ondansetron (ZOFRAN ODT) 4 MG disintegrating tablet Take 1 tablet (4 mg total) by mouth every 8 (eight) hours as needed for nausea or vomiting. 01/01/20   Horton, Barbette Hair, MD  rosuvastatin (CRESTOR) 5 MG tablet Take 5 mg by mouth daily.    [provider]  tamsulosin (FLOMAX) 0.4 MG CAPS capsule Take 1 capsule (0.4 mg total) by mouth daily. 01/01/20   Horton, Barbette Hair, MD  torsemide (DEMADEX) 20 MG tablet Take 1 tablet (20 mg total) by mouth daily. 05/15/19   Burtis Junes, NP  verapamil (CALAN-SR) 240 MG CR tablet Take 1 tablet (240 mg total) by mouth daily. 05/13/19   Burtis Junes, NP     Allergies:    Allergies  Allergen Reactions   Macrobid [Nitrofurantoin Monohyd Macro] Other (See Comments)    Lowered potassium, caused aches and pains    Social History:   Social History   Socioeconomic History   Marital status: Married    Spouse name: Not on file   Number of children: Not on file   Years of education: Not on file   Highest education level: Not on file  Occupational History   Not on file  Tobacco Use   Smoking status: Former Smoker    Packs/day: 1.00    Years: 20.00    Pack years: 20.00    Types: Cigarettes    Quit date: 03/28/2009    Years since quitting: 10.7   Smokeless tobacco: Never Used   Tobacco comment: social smoker  Vaping Use   Vaping Use: Never used   Substance and Sexual Activity   Alcohol use: No   Drug use: No   Sexual activity: Not on file  Other Topics Concern   Not on file  Social History Narrative   Not on file   Social Determinants of Health   Financial Resource Strain:    Difficulty of Paying Living Expenses: Not on file  Food Insecurity:    Worried About Charity fundraiser in [place] Last Year: Not on file   [employer] of Food in [place] Last Year: Not on file  Transportation Needs:    Lack of Transportation (Medical):  Not on file   Lack of Transportation (Non-Medical): Not on file  Physical Activity:    Days of Exercise per Week: Not on file   Minutes of Exercise per Session: Not on file  Stress:    Feeling of Stress : Not on file  Social Connections:    Frequency of Communication with Friends and Family: Not on file   Frequency of Social Gatherings with Friends and Family: Not on file   Attends Religious Services: Not on file   Active Member of Clubs or Organizations: Not on file   Attends Archivist Meetings: Not on file   Marital Status: Not on file  Intimate Partner Violence:    Fear of Current or Ex-Partner: Not on file   Emotionally Abused: Not on file   Physically Abused: Not on file   Sexually Abused: Not on file    Family History:  Brandy Brandy Townsend's family history is not on file.   Brandy Brandy Townsend She indicated that her mother is deceased. She indicated that her father is deceased. She indicated that her brother is alive.    ROS:  Please see Brandy history of present illness.  All other ROS reviewed and negative.     Physical Exam/Data:   Vitals:   01/08/20 1011  BP: 126/71  Pulse: 80  Resp: 17  Temp: 98.4 F (36.9 C)  TempSrc: Oral  SpO2: 97%  Weight: 66.7 kg  Height: 5\' 3"  (1.6 m)   No intake or output data in Brandy 24 hours ending 01/08/20 1153 Filed Weights   01/08/20 1011  Weight: 66.7 kg   Body mass index is 26.04 kg/m.  General:  Well nourished, well developed, female in mild  distress HEENT: normal Lymph: no adenopathy Neck:  JVD not elevated Endocrine:  No thryomegaly Vascular: No carotid bruits; 4/4 extremity pulses 2+ bilaterally  Cardiac:  normal S1, S2; RRR; no murmur, no rub or gallop  Lungs:  clear to auscultation bilaterally, no wheezing, rhonchi or rales  Abd: soft, nontender, no hepatomegaly  Ext: no edema Musculoskeletal:  No deformities, BUE and BLE strength normal and equal Skin: warm and dry  Neuro:  CNs 2-12 intact, no focal abnormalities noted Psych:  Normal affect    EKG:  Brandy ECG was personally reviewed: SR, HR 79, no acute ischemic changes Telemetry: SR  Relevant CV Studies:  Myoview Study Highlights 05/14/2019   Brandy left ventricular ejection fraction is hyperdynamic (>65%). Nuclear stress EF: 77%. Horizontal ST segment depression ST segment depression of 1 mm was noted during stress in Brandy II, III and aVF leads, and returning to baseline after 5-9 minutes of recovery. Brandy study is normal. This is a low risk study. No change from prior study.      ECHO IMPRESSIONS 05/14/2019   1. Left ventricular ejection fraction, by estimation, is 55 to 60%. Brandy  left ventricle has normal function. Brandy left ventricle has no regional  wall motion abnormalities. Left ventricular diastolic parameters were  normal.   2. Right ventricular systolic function is normal. Brandy right ventricular  size is normal. There is normal pulmonary artery systolic pressure. Brandy  estimated right ventricular systolic pressure is 23.5 mmHg.   3. Brandy mitral valve is grossly normal. No evidence of mitral valve  regurgitation.   4. Brandy aortic valve is tricuspid. Aortic valve regurgitation is not  visualized. No aortic stenosis is present.   5. Brandy inferior vena cava is normal in size with greater than 50%  respiratory  variability, suggesting right atrial pressure of 3 mmHg.    Laboratory Data:  Chemistry Recent Labs  Lab 01/08/20 1014  NA 141  K 3.4*  CL 102   CO2 25  GLUCOSE 113*  BUN 13  CREATININE 0.89  CALCIUM 9.7  GFRNONAA >60  ANIONGAP 14    No results for input(s): PROT, ALBUMIN, AST, ALT, ALKPHOS, BILITOT in Brandy last 168 hours. Hematology Recent Labs  Lab 01/08/20 1014  WBC 8.4  RBC 4.60  HGB 13.6  HCT 42.7  MCV 92.8  MCH 29.6  MCHC 31.9  RDW 13.8  PLT 276   Cardiac Enzymes  High Sensitivity Troponin:   Recent Labs  Lab 01/08/20 1014  TROPONINIHS 3     BNPNo results for input(s): BNP, PROBNP in Brandy last 168 hours.  DDimer No results for input(s): DDIMER in Brandy last 168 hours. Lipids:  Lab Results  Component Value Date   CHOL 180 05/10/2017   HDL 75 05/10/2017   LDLCALC 93 05/10/2017   TRIG 60 05/10/2017   CHOLHDL 2.4 05/10/2017   INR: No results found for: INR, PROTIME A1c: No results found for: HGBA1C Thyroid:  Lab Results  Component Value Date   TSH 1.735 05/08/2019    Radiology/Studies:  DG Chest 2 View  Result Date: 01/08/2020 CLINICAL DATA:  Chest pain today. EXAM: CHEST - 2 VIEW COMPARISON:  PA and lateral chest 12/10/2019.  CT chest 07/22/2019. FINDINGS: Lungs clear. Heart size normal. No pneumothorax or pleural fluid. No acute or focal bony abnormality. IMPRESSION: Negative chest. Electronically Signed   By: Inge Rise M.D.   On: 01/08/2020 11:01    Assessment and Plan:   1. Chest pain - initial trop negative, ECG not acute - cardiac CT showed probable significant dz in RCA, OM1 - cardiac cath is indicated - Cardiac catheterization was discussed with Brandy Brandy Townsend fully. Brandy Brandy Townsend understands that risks include but are not limited to stroke (1 in 1000), death (1 in 65), kidney failure [usually temporary] (1 in 500), bleeding (1 in 200), allergic reaction [possibly serious] (1 in 200).  Brandy Brandy Townsend understands and is willing to proceed.    Otherwise, continue home rx Palliative Care was contacted, they will see to let her know how they can help w/ her husband.   Active Problems:    Nonspecific chest pain   Anxiety     For questions or updates, please contact [facility] Please consult [email or web address] for contact info under Cardiology/STEMI.    Signed, Rosaria Ferries, PA-C  01/08/2020 11:53 AM   Brandy Townsend examined chart reviewed Discussed care with Brandy Townsend, son and PA. Exam with anxious teary eyed bronchitic female Mild exp wheezing Normal JVP no murmur soft abdomen no edema good right radial pulse. Pain and dyspnea are atypical and may be related to her RA ( recent steroid shot) COPD and she has also had two recent kidney stones. Her husband has esophageal cancer with J tube and PIC line and she is overwhelmed with his car as she does his TPN and feedings and he cannot drive. Spoke with son Aaron Edelman in [place] and brother is driving down from [place] . Given her cardiac CTA with FFR CT showing likely hemodynamically significant stenosis in mid/distal RCA and Diagonal will try to cath today. Risks including stroke, bleeding , MI, emergency surgery and renal failure discussed She agrees to proceed. She has had contrast with her cardiac CTA with no issues. Has a good right radial pulse for access  Jenkins Rouge MD [facility]

## 2020-01-08 NOTE — Progress Notes (Signed)
Ria Comment, [place] in to see client and okay to d/c home

## 2020-01-08 NOTE — ED Provider Notes (Signed)
[facility] EMERGENCY DEPARTMENT Provider Note   CSN: [id number] Arrival date & time: 01/08/20  1009     History Chief Complaint  Patient presents with  . Chest Pain    Brandy Townsend is a 69 y.o. female with past medical history of hypertension, asthma, RA, presents to the ED for chief complaint of chest pain.  Patient states that she has been having chest tightness for a few months.  She complains of shortness of breath and chest tightness with exertion and patient only relieved with rest.  Patient is not taking nitroglycerin.  Described pain as pressure, located at mid lower chest and epigastric region, does not radiate.  Patient was seen by cardiologist and had a CT coronary scan which showed significant stenosis in the RCA and diagonal artery.  They called her this morning to informed her the results and set up for a cardiac cath.  Patient states that after she heard the news she became anxious and developed chest pain at rest.  She checked her blood pressure which showed 160/110.  She was told to go to the ED by the provider.  Patient is alert and oriented during examination.  She states that she has constant mild chest pressure.  Patient walked to the bathroom without short of breath or chest pain.  HPI     Past Medical History:  Diagnosis Date  . Asthma   . Graves disease   . Hypertension   . Osteoporosis   . Rheumatoid arthritis ([place])   . Thyroid disease   . Vertigo     Patient Active Problem List   Diagnosis Date Noted  . Anxiety 01/08/2020  . Pulmonary infiltrate present on computed tomography   . Right kidney stone 07/18/2019  . Nephrolithiasis 07/18/2019  . Rheumatoid arthritis ([place]) 01/14/2017  . Pain in both hands 05/12/2016  . Chronic left shoulder pain 05/12/2016  . Chronic sinusitis of both maxillary sinuses 03/24/2016  . 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 Nonspecific chest pain 05/16/2015  . HTN  (hypertension) 05/16/2015  . Hyperthyroidism 05/16/2015  . Prerenal azotemia 05/16/2015    Past Surgical History:  Procedure Laterality Date  . BRONCHIAL WASHINGS  09/12/2019   Procedure: BRONCHIAL WASHINGS;  Surgeon: Brand Males, MD;  Location: [facility];  Service: Endoscopy;;  . VIDEO BRONCHOSCOPY N/A 09/12/2019   Procedure: VIDEO BRONCHOSCOPY WITHOUT FLUORO;  Surgeon: Brand Males, MD;  Location: [facility];  Service: Endoscopy;  Laterality: N/A;     OB History   No obstetric history on file.     No family history on file.  Social History   Tobacco Use  . Smoking status: Former Smoker    Packs/day: 1.00    Years: 20.00    Pack years: 20.00    Types: Cigarettes    Quit date: 03/28/2009    Years since quitting: 10.7  . Smokeless tobacco: Never Used  . Tobacco comment: social smoker  Vaping Use  . Vaping Use: Never used  Substance Use Topics  . Alcohol use: No  . Drug use: No    Home Medications Prior to Admission medications   Medication Sig Start Date End Date Taking? Authorizing Provider  acetaminophen (TYLENOL) 500 MG tablet Take 500 mg by mouth every 6 (six) hours as needed for moderate pain.    [provider]  albuterol (VENTOLIN HFA) 108 (90 Base) MCG/ACT inhaler Inhale 1-2 puffs into the lungs every 6 (six) hours as needed for  wheezing or shortness of breath. 12/10/19   Parrett, Fonnie Mu, NP  aspirin EC 81 MG tablet Take 1 tablet (81 mg total) by mouth daily. Swallow whole. 12/17/19   Burtis Junes, NP  cetirizine (ZYRTEC) 10 MG tablet Take 10 mg by mouth at bedtime as needed for allergies.     [provider]  Cholecalciferol (DIALYVITE VITAMIN D 5000) 125 MCG (5000 UT) capsule Take 5,000 Units by mouth daily.    [provider]  HYDROcodone-acetaminophen (NORCO/VICODIN) 5-325 MG tablet Take 1 tablet by mouth every 6 (six) hours as needed. 01/01/20   Horton, Barbette Hair, MD  ibuprofen (ADVIL) 200 MG tablet Take 200 mg by  mouth every 6 (six) hours as needed (inflammation pain).     [provider]  inFLIXimab-abda (RENFLEXIS IV) Inject 1 ampule into the vein. Every two weeks, then once every six weeks    [provider]  KLOR-CON 10 10 MEQ tablet Take 10 mEq by mouth daily. 08/11/14   [provider]  levothyroxine (SYNTHROID) 50 MCG tablet Take 50 mcg by mouth daily.  08/16/18   [provider]  methotrexate (RHEUMATREX) 2.5 MG tablet Take 2.5 mg by mouth once a week. Caution:Chemotherapy. Protect from light. Patient takes six pills on Friday    [provider]  metoprolol tartrate (LOPRESSOR) 100 MG tablet Take 1 tablet (100 mg total) by mouth as directed. Take 2 hours prior to CT scan 12/17/19 03/16/20  Burtis Junes, NP  mometasone-formoterol (DULERA) 200-5 MCG/ACT AERO Inhale 2 puffs into the lungs in the morning and at bedtime. 06/24/19   Martyn Ehrich, NP  ondansetron (ZOFRAN ODT) 4 MG disintegrating tablet Take 1 tablet (4 mg total) by mouth every 8 (eight) hours as needed for nausea or vomiting. 01/01/20   Horton, Barbette Hair, MD  rosuvastatin (CRESTOR) 5 MG tablet Take 5 mg by mouth daily.    [provider]  tamsulosin (FLOMAX) 0.4 MG CAPS capsule Take 1 capsule (0.4 mg total) by mouth daily. 01/01/20   Horton, Barbette Hair, MD  torsemide (DEMADEX) 20 MG tablet Take 1 tablet (20 mg total) by mouth daily. 05/15/19   Burtis Junes, NP  verapamil (CALAN-SR) 240 MG CR tablet Take 1 tablet (240 mg total) by mouth daily. 05/13/19   Burtis Junes, NP    Allergies    Macrobid [nitrofurantoin monohyd macro]  Review of Systems   Review of Systems  Constitutional: Positive for diaphoresis.  Respiratory: Positive for chest tightness and shortness of breath.   Cardiovascular: Positive for chest pain. Negative for palpitations and leg swelling.  Gastrointestinal: Negative for abdominal pain and diarrhea.  Neurological: Positive for headaches.    Physical  Exam Updated Vital Signs BP 126/71 (BP Location: Right Arm)   Pulse 80   Temp 98.4 F (36.9 C) (Oral)   Resp 17   Ht 5\' 3"  (1.6 m)   Wt 66.7 kg   SpO2 97%   BMI 26.04 kg/m   Physical Exam Constitutional:      General: She is not in acute distress. HENT:     Head: Normocephalic.  Eyes:     General:        Right eye: No discharge.        Left eye: No discharge.  Cardiovascular:     Rate and Rhythm: Normal rate and regular rhythm.     Heart sounds: Murmur heard.   Pulmonary:     Effort: No respiratory distress.  Breath sounds: Normal breath sounds. No wheezing.  Abdominal:     General: Bowel sounds are normal.     Palpations: Abdomen is soft.     Tenderness: There is no abdominal tenderness.  Musculoskeletal:     Cervical back: Normal range of motion.     Right lower leg: No edema.     Left lower leg: No edema.     Comments: Some tenderness to palpation at mid lower chest.  Skin:    General: Skin is warm.  Neurological:     Mental Status: She is alert.  Psychiatric:        Mood and Affect: Mood normal.     ED Results / Procedures / Treatments   Labs (all labs ordered are listed, but only abnormal results are displayed) Labs Reviewed  BASIC METABOLIC PANEL - Abnormal; Notable for the following components:      Result Value   Potassium 3.4 (*)    Glucose, Bld 113 (*)    All other components within normal limits  RESPIRATORY PANEL BY RT PCR (FLU A&B, COVID)  CBC  TROPONIN I (HIGH SENSITIVITY)  TROPONIN I (HIGH SENSITIVITY)    EKG None  Radiology DG Chest 2 View  Result Date: 01/08/2020 CLINICAL DATA:  Chest pain today. EXAM: CHEST - 2 VIEW COMPARISON:  PA and lateral chest 12/10/2019.  CT chest 07/22/2019. FINDINGS: Lungs clear. Heart size normal. No pneumothorax or pleural fluid. No acute or focal bony abnormality. IMPRESSION: Negative chest. Electronically Signed   By: Inge Rise M.D.   On: 01/08/2020 11:01    Procedures Procedures  (including critical care time)  Medications Ordered in ED Medications  aspirin chewable tablet 81 mg (81 mg Oral Not Given 01/08/20 1138)  0.9% sodium chloride infusion (has no administration in time range)    Followed by  0.9% sodium chloride infusion (has no administration in time range)  potassium chloride SA (KLOR-CON) CR tablet 40 mEq (has no administration in time range)    ED Course  I have reviewed the triage vital signs and the nursing notes.  Pertinent labs & imaging results that were available during my care of the patient were reviewed by me and considered in my medical decision making (see chart for details).    MDM Rules/Calculators/A&P                          Patient presents to the ED for chief complaint of chest pain.  Patient has been having typical chest pain for the past few months.  She was seen by cardiologist and had a CT coronary scan performed which show significant stenosis of the RCA and diagonal artery.  She was supposed to have a cardiac cath scheduled but had to come to the ED due to chest pain while receiving the results.  Patient appears hemodynamically stable on exam.  She can walk to the bathroom without feeling short of breath or chest pain.  EKG did not show any obvious ischemia evidence.  First troponin was 3.  CMP and CBC was unremarkable.  Cardiology was consulted and have seen the patient.  Hospitalist service was also consulted.  Patient will be admitted by either services.  Final Clinical Impression(s) / ED Diagnoses Final diagnoses:  Nonspecific chest pain  Anxiety    Rx / DC Orders ED Discharge Orders    None       Gaylan Gerold, DO 01/08/20 [place], Vonna Kotyk  G, MD 01/09/20 [phone]

## 2020-01-08 NOTE — Interval H&P Note (Signed)
History and Physical Interval Note:  01/08/2020 2:09 PM  Brandy Townsend  has presented today for surgery, with the diagnosis of chest pain.  The various methods of treatment have been discussed with the patient and family. After consideration of risks, benefits and other options for treatment, the patient has consented to  Procedure(s): LEFT HEART CATH AND CORONARY ANGIOGRAPHY (N/A) as a surgical intervention.  The patient's history has been reviewed, patient examined, no change in status, stable for surgery.  I have reviewed the patient's chart and labs.  Questions were answered to the patient's satisfaction.   Cath Lab Visit (complete for each Cath Lab visit)  Clinical Evaluation Leading to the Procedure:   ACS: Yes.    Non-ACS:    Anginal Classification: CCS III  Anti-ischemic medical therapy: Maximal Therapy (2 or more classes of medications)  Non-Invasive Test Results: Intermediate-risk stress test findings: cardiac mortality 1-3%/year  Prior CABG: No previous CABG        [facility] 01/08/2020 2:09 PM

## 2020-01-08 NOTE — Progress Notes (Signed)
Please call patient's son with updates this admission:   Brandy Townsend [phone] (cell)

## 2020-01-08 NOTE — Progress Notes (Signed)
Client c/o 7/10 chest discomfort, [place],PA notified and in to see client; no new orders noted

## 2020-01-08 NOTE — Progress Notes (Signed)
Client up to bathroom and states chest discomfort down to 6/10; [place], [place] notified

## 2020-01-09 ENCOUNTER — Telehealth: Payer: Self-pay | Admitting: Cardiovascular Disease

## 2020-01-09 ENCOUNTER — Encounter (HOSPITAL_COMMUNITY): Payer: Self-pay | Admitting: Cardiology

## 2020-01-09 LAB — POCT ACTIVATED CLOTTING TIME: Activated Clotting Time: 268 seconds

## 2020-01-09 NOTE — Telephone Encounter (Signed)
Pt c/o medication issue:  1. Name of Medication: isosorbide mononitrate (IMDUR) 30 MG 24 hr tablet  2. How are you currently taking this medication (dosage and times per day)? n/a  3. Are you having a reaction (difficulty breathing--STAT)? no  4. What is your medication issue? Patient had a heart cath done. On the discharge instructions she states it says for her to stop taking Metoprolol and start taking Isosorbide Mononitrate. She states she only took metoprolol prior to her CT on 10/11 and that the medication was not one she took on a regular basis. She wants to make sure that Isosorbide is not to replace a medication that the hospital "thought" she was taking regularly. Also, she states she is not taking hydrocodone, she only took one tablet last week for kidney stones.

## 2020-01-09 NOTE — Telephone Encounter (Signed)
Called an spoke with patient answering all her questions in detail.  Advised she is to start Isosorbide as therapy for non obstructive CAD and that it is not in place of Metoprolol.  Advised we do know she was not taking Metoprolol daily and that it had been given as a one time dose before her CT scan.  She was concerned because the CT scan gave her a "false positive" for having heart blockages.  Informed pt the CT scan is an excellent non-invasive tool that did demonstrate plaque in her coronary arteries but the best way to know the extent of the plaque is to do the heart cath which gives the most clear understanding of the severity.  Pt states understanding and was grateful for the call back and explanation.  She will c/b with any further questions or concerns.

## 2020-01-10 ENCOUNTER — Telehealth: Payer: Self-pay | Admitting: Nurse Practitioner

## 2020-01-10 NOTE — Telephone Encounter (Signed)
Pt called triage with c/o sob that has been going on all day, headache, and exhaustion. She started both Carvidelol and Isosorbide this morning. I have routed this call for advice and we will f/u with pt today.

## 2020-01-10 NOTE — Telephone Encounter (Signed)
Pt is returning call.  

## 2020-01-10 NOTE — Telephone Encounter (Signed)
Called patient again. She complaining of SOB with activity, headache, and exhaustion. Patient had a heart cath on 10/13, and at discharge patient was started on Coreg 3.125 mg BID, Imdur 15 mg, and Verapamil was discontinued. Today is the first day patient started new medications. Patient's BP this am was 107/61 and HR 90. Right now BP 110/63 and HR 92. Informed patient that starting Beta Blockers like Coreg can make you feel some SOB with activity when first starting, because it is suppose to keep HR low. Informed patient that headache is a common side effect of Imdur. Informed patient that her body might take a week to get use to the medications. Patient stated she was feeling okay right now while she is sitting, she stated she had no energy to do anything. Patient stated she was just concerned going into the weekend having no energy.  Consulted DOD, Dr. Rayann Heman, he recommend not making any changes and have patient follow up next week. Tried to call patient back, there was no answer left message to call back. Patient has appointment on 01/20/20 with APP.   Patient called back. Informed patient of recommendations. Patient will see how she feels over the weekend and will call back if she has any issues.

## 2020-01-10 NOTE — Telephone Encounter (Signed)
Pt c/o Shortness Of Breath: STAT if SOB developed within the last 24 hours or pt is noticeably SOB on the phone  1. Are you currently SOB (can you hear that pt is SOB on the phone)? Yes   2. How long have you been experiencing SOB? alday  3. Are you SOB when sitting or when up moving around? both  4. Are you currently experiencing any other symptoms? Headache patient is also on a new medication.

## 2020-01-10 NOTE — Telephone Encounter (Signed)
Left message for patient to call back  

## 2020-01-13 NOTE — Progress Notes (Addendum)
CARDIOLOGY OFFICE NOTE  Date:  01/20/2020    Brandy Townsend Date of Birth: Mar 27, 1951 Medical Record #[id number]  PCP:  Reynold Bowen, MD  Cardiologist:  Gillian Shields  Chief Complaint  Patient presents with  . Follow-up    Post hospital/cath visit - seen for Dr. Johnsie Cancel    History of Present Illness: Brandy Townsend is a 68 y.o. female who presents today for a post hospital visit. Seen for Dr. Johnsie Cancel.   She has a history of palpitations, atypical chest pain with prior normal Myoview, asthma, RA, hx thyroid ablation, remote tobacco use, and +vaccines for Covid.   I saw her back last month - she had had prior cardiac testing that was reassuring - she continued to have atypical chest pain - we got a coronary CT - there was concern for obstructive disease due to +FFR. After results were called and appointment made - she had onset of chest pain/SOB - and was referred on to the ER. Cardiac cath was recommended. Found to have mild to moderate disease - medical management recommended. Noted significant stress with caring for her husband who has esophageal cancer - on TPN, weak, etc.   Comes in today. Here alone. Doing ok. Was not able to tolerate Imdur - due to significant headache - has stopped this. Coreg was added to her regimen. Verapamil stopped. She notes higher BP at home. She remains short of breath.  Seems worse with the Coreg. She notes that she got very overwhelmed with the call regarding her results of the CT. She had had kidney stones and had been in the hospital - the next day her husband's nurse had her call EMS for abnormal labs - which turned out to be an error - then we called with her CT and need for cath - she says she did panic. She has no chest pain. She sees pulmonary later this week. She would like to post pone her jury duty - she is the caregiver for her husband and does not wish to be exposed to possible COVID - she had reaction after her 2nd vaccine - 3rd has been  placed on hold for her. They both basically quarantine at home due to his health issues. She is going to discuss this with PCP. She does wish to have a flu shot. Was given the ok by her rheumatologist for the flu vaccine.   She had labs by Dr. Forde Dandy - do not think her LDL is below 70.   Past Medical History:  Diagnosis Date  . Asthma   . CAD (coronary artery disease)   . Graves disease    s/p RAI  . Hypertension   . Osteoporosis   . Rheumatoid arthritis ([place])   . Thyroid disease   . Vertigo     Past Surgical History:  Procedure Laterality Date  . BRONCHIAL WASHINGS  09/12/2019   Procedure: BRONCHIAL WASHINGS;  Surgeon: Brand Males, MD;  Location: [facility];  Service: Endoscopy;;  . INTRAVASCULAR PRESSURE WIRE/FFR STUDY N/A 01/08/2020   Procedure: INTRAVASCULAR PRESSURE WIRE/FFR STUDY;  Surgeon: Martinique, Peter M, MD;  Location: [facility];  Service: Cardiovascular;  Laterality: N/A;  . LEFT HEART CATH AND CORONARY ANGIOGRAPHY N/A 01/08/2020   Procedure: LEFT HEART CATH AND CORONARY ANGIOGRAPHY;  Surgeon: Martinique, Peter M, MD;  Location: [facility];  Service: Cardiovascular;  Laterality: N/A;  . VIDEO BRONCHOSCOPY N/A 09/12/2019   Procedure: VIDEO BRONCHOSCOPY WITHOUT FLUORO;  Surgeon: Brand Males, MD;  Location: [facility];  Service: Endoscopy;  Laterality: N/A;     Medications: Current Meds  Medication Sig  . acetaminophen (TYLENOL) 500 MG tablet Take 500 mg by mouth every 6 (six) hours as needed for moderate pain.  Marland Kitchen albuterol (VENTOLIN HFA) 108 (90 Base) MCG/ACT inhaler Inhale 1-2 puffs into the lungs every 6 (six) hours as needed for wheezing or shortness of breath.  Marland Kitchen alendronate (FOSAMAX) 70 MG tablet Take 70 mg by mouth once a week.  Marland Kitchen aspirin EC 81 MG tablet Take 1 tablet (81 mg total) by mouth daily. Swallow whole.  . cetirizine (ZYRTEC) 10 MG tablet Take 10 mg by mouth at bedtime as needed for allergies.   . folic acid (FOLVITE) 1 MG tablet  Take 3 mg by mouth daily.  Marland Kitchen ibuprofen (ADVIL) 200 MG tablet Take 200 mg by mouth every 6 (six) hours as needed for mild pain (inflammation pain).   . inFLIXimab-abda (RENFLEXIS IV) Inject 1 ampule into the vein once a week. Every two weeks, then once every six weeks   . KLOR-CON 10 10 MEQ tablet Take 10 mEq by mouth daily.  Marland Kitchen levothyroxine (SYNTHROID) 50 MCG tablet Take 50 mcg by mouth daily.   . methotrexate (RHEUMATREX) 2.5 MG tablet Take 15 mg by mouth once a week. Monday evening  . mometasone-formoterol (DULERA) 200-5 MCG/ACT AERO Inhale 2 puffs into the lungs in the morning and at bedtime.  . ondansetron (ZOFRAN ODT) 4 MG disintegrating tablet Take 1 tablet (4 mg total) by mouth every 8 (eight) hours as needed for nausea or vomiting.  . pyridOXINE (VITAMIN B-6) 100 MG tablet Take 100 mg by mouth daily.  Marland Kitchen torsemide (DEMADEX) 20 MG tablet Take 1 tablet (20 mg total) by mouth daily.  . Vitamin D, Ergocalciferol, (DRISDOL) 1.25 MG (50000 UNIT) CAPS capsule Take 50,000 Units by mouth every 7 (seven) days. Monday  . [DISCONTINUED] carvedilol (COREG) 3.125 MG tablet Take 1 tablet (3.125 mg total) by mouth 2 (two) times daily.  . [DISCONTINUED] rosuvastatin (CRESTOR) 5 MG tablet Take 5 mg by mouth daily.  . [DISCONTINUED] tamsulosin (FLOMAX) 0.4 MG CAPS capsule Take 1 capsule (0.4 mg total) by mouth daily.     Allergies: Allergies  Allergen Reactions  . Macrobid [employer] Macro] Other (See Comments)    Lowered potassium, caused aches and pains    Social History: The patient  reports that she quit smoking about 10 years ago. Her smoking use included cigarettes. She has a 20.00 pack-year smoking history. She has never used smokeless tobacco. She reports that she does not drink alcohol and does not use drugs.   Family History: The patient's family history is not on file. FH + for CAD.   Review of Systems: Please see the history of present illness.   All other systems are  reviewed and negative.   Physical Exam: VS:  BP (!) 146/88 (BP Location: Left Arm, Patient Position: Sitting, Cuff Size: Normal)   Pulse 71   Ht 5\' 3"  (1.6 m)   Wt 147 lb 12.8 oz (67 kg)   SpO2 100% Comment: at rest  BMI 26.18 kg/m  .  BMI Body mass index is 26.18 kg/m.  Wt Readings from Last 3 Encounters:  01/20/20 147 lb 12.8 oz (67 kg)  01/08/20 147 lb (66.7 kg)  12/17/19 148 lb (67.1 kg)   BP is 140/90 by me.  General: Alert and in no acute distress.   Cardiac: Regular  rate and rhythm. No murmurs, rubs, or gallops. No edema.  Respiratory:  Lungs are clear to auscultation bilaterally with normal work of breathing.  GI: Soft and nontender.  MS: No deformity or atrophy. Gait and ROM intact.  Skin: Warm and dry. Color is normal.  Neuro:  Strength and sensation are intact and no gross focal deficits noted.  Psych: Alert, appropriate and with normal affect.   LABORATORY DATA:  EKG:  EKG is not ordered today.    Lab Results  Component Value Date   WBC 8.4 01/08/2020   HGB 13.6 01/08/2020   HCT 42.7 01/08/2020   PLT 276 01/08/2020   GLUCOSE 113 (H) 01/08/2020   CHOL 180 05/10/2017   TRIG 60 05/10/2017   HDL 75 05/10/2017   LDLCALC 93 05/10/2017   ALT 24 07/27/2019   AST 18 07/27/2019   NA 141 01/08/2020   K 3.4 (L) 01/08/2020   CL 102 01/08/2020   CREATININE 0.89 01/08/2020   BUN 13 01/08/2020   CO2 25 01/08/2020   TSH 1.735 05/08/2019       BNP (last 3 results) No results for input(s): BNP in the last 8760 hours.  ProBNP (last 3 results) No results for input(s): PROBNP in the last 8760 hours.   Other Studies Reviewed Today:  CARDIAC CATHETERIZATION  Result Date: 01/08/2020  Mid LAD lesion is 20% stenosed.  1st Diag lesion is 40% stenosed.  Ramus lesion is 70% stenosed.  1st Mrg lesion is 60% stenosed.  Prox RCA to Mid RCA lesion is 55% stenosed.  The left ventricular systolic function is normal.  LV end diastolic pressure is normal.  The left  ventricular ejection fraction is 55-65% by visual estimate.  1. Nonobstructive CAD. There is modest disease in the mid RCA with normal DFR. Modest disease in the first OM. The ramus branch is tiny. 2. Normal LV function 3. Normal LVEDP. Plan: recommend continued medical therapy. Patient is a candidate for DC today.   CORONARY CT IMPRESSION 12/2019: 1. Calcium score 790 which is 58 th percentile for age and sex and involves all 3 major epicardial vessels  2.  Normal aortic root 2.9 cm  3. CAD RADS 3 possible obstructive CAD tightest lesions appear to be in mid/distal RCA and OM1 Study sent for [facility] CT  [place]   Electronically Signed   By: Eligha Bridegroom  MyoviewStudy Highlights2/2021   The left ventricular ejection fraction is hyperdynamic (>65%).  Nuclear stress EF: 77%.  Horizontal ST segment depression ST segment depression of 1 mm was noted during stress in the II, III and aVF leads, and returning to baseline after 5-9 minutes of recovery.  The study is normal.  This is a low risk study.  No change from prior study.    ECHOIMPRESSIONS2/2021  1. Left ventricular ejection fraction, by estimation, is 55 to 60%. The  left ventricle has normal function. The left ventricle has no regional  wall motion abnormalities. Left ventricular diastolic parameters were  normal.  2. Right ventricular systolic function is normal. The right ventricular  size is normal. There is normal pulmonary artery systolic pressure. The  estimated right ventricular systolic pressure is 81.0 mmHg.  3. The mitral valve is grossly normal. No evidence of mitral valve  regurgitation.  4. The aortic valve is tricuspid. Aortic valve regurgitation is not  visualized. No aortic stenosis is present.  5. The inferior vena cava is normal in size with greater than 50%  respiratory variability,  suggesting right atrial pressure of 3 mmHg.    ASSESSMENT &PLAN:    1. CAD - recent  cath following abnormal CT and in the setting of having chest pain/SOB - to manage medically. She is on statin - we will try to push this dose. She did not tolerate Imdur - she feels more short of breath with Coreg - will change back over to Verapamil 240 mg a day. CV risk factor modification imperative for her.   2. HTN - goal needs to be 130/80 - see above - she will monitor for [place]. We may need to make additional changes.   3. HLD - on statin - trying to increase that to 10 mg a day - if able to tolerate - she will let us know and we will recheck in about 2 months.   4. RA - followed by Rheumatology  5. Former smoker - resolved.   6. Pulmonary infiltrates - prior bronchoscopy noted. Followed by pulmonary. Seeing them later this week.   7. Situational stress - she is going to talk with PCP - may need SSRI or counseling options. I think she got overwhelmed when everything "happened at once to her".   Current medicines are reviewed with the patient today.  The patient does not have concerns regarding medicines other than what has been noted above.  The following changes have been made:  See above.  Labs/ tests ordered today include:    Orders Placed This Encounter  Procedures  . Flu Vaccine QUAD High Dose(Fluad)     Disposition:   FU with [place] as planned. She will keep a check on her BP for [place]. Crestor is increased today as well. Flu shot was also given today.    Patient is agreeable to this plan and will call if any problems develop in the interim.   SignedTruitt Merle, NP  01/20/2020 12:21 PM  [facility] [address] [place], [place]  [address] Phone: [phone] Fax: [phone]       Addendum - message sent by MyChart: Brandy Townsend, Thanks for todays visit!! I did find my labs from Dr Forde Dandy from 9/21 cholesterol 182 ,triglycerides 97, HDL 89, LDL 74, ldl/hdl Radio 0.8, non -hdl 93.0 .  Hope this helps . Thanks, Brandy Townsend  Would  still favoring trying the 10 mg of Crestor.   Burtis Junes, RN, [facility] [address] [place], [place]  [address] [phone]

## 2020-01-16 NOTE — Telephone Encounter (Signed)
Called patient back. Patient stated that her headaches are intolerable and she has tried taking tylenol and nothing is helping her headaches with Imdur. Will forward to Dr. Johnsie Cancel for advisement.

## 2020-01-16 NOTE — Telephone Encounter (Signed)
Ok to stop imdur

## 2020-01-16 NOTE — Telephone Encounter (Signed)
Patient is returning call and wanted to speak with Pam. Please call back

## 2020-01-17 NOTE — Telephone Encounter (Signed)
Called patient back with Dr. Kyla Balzarine response. Patient stated that her headache went away around 6:00 pm, so she would like to try taking Imdur later in the day. Informed patient that she could try that and see if that helps, but just to stop it if the headaches were too much for her. Patient agreed to plan.

## 2020-01-20 ENCOUNTER — Encounter: Payer: Self-pay | Admitting: Nurse Practitioner

## 2020-01-20 ENCOUNTER — Ambulatory Visit (INDEPENDENT_AMBULATORY_CARE_PROVIDER_SITE_OTHER): Payer: Medicare Other | Admitting: Nurse Practitioner

## 2020-01-20 ENCOUNTER — Other Ambulatory Visit: Payer: Self-pay

## 2020-01-20 VITALS — BP 146/88 | HR 71 | Ht 63.0 in | Wt 147.8 lb

## 2020-01-20 DIAGNOSIS — Z9889 Other specified postprocedural states: Secondary | ICD-10-CM

## 2020-01-20 DIAGNOSIS — Z23 Encounter for immunization: Secondary | ICD-10-CM | POA: Diagnosis not present

## 2020-01-20 DIAGNOSIS — I259 Chronic ischemic heart disease, unspecified: Secondary | ICD-10-CM

## 2020-01-20 DIAGNOSIS — E782 Mixed hyperlipidemia: Secondary | ICD-10-CM | POA: Diagnosis not present

## 2020-01-20 DIAGNOSIS — I10 Essential (primary) hypertension: Secondary | ICD-10-CM

## 2020-01-20 DIAGNOSIS — R0600 Dyspnea, unspecified: Secondary | ICD-10-CM

## 2020-01-20 MED ORDER — VERAPAMIL HCL ER 240 MG PO TBCR: 240.0000 mg | EXTENDED_RELEASE_TABLET | Freq: Every day | ORAL | Status: DC

## 2020-01-20 MED ORDER — ROSUVASTATIN CALCIUM 10 MG PO TABS: 10.0000 mg | ORAL_TABLET | Freq: Every day | ORAL | 3 refills | Status: DC

## 2020-01-20 NOTE — Patient Instructions (Addendum)
After Visit Summary:  We will be checking the following labs today - NONE  Lipids and LFTs in about 2 months  Medication Instructions:    Continue with your current medicines. BUT  We will stay off the Imdur  We will change the Coreg back to Verapamil 240 mg a day - take today.   We are increasing the Crestor to 10 mg a day   If you need a refill on your cardiac medications before your next appointment, please call your pharmacy.     Testing/Procedures To Be Arranged:  N/A  Follow-Up:   See Dr. Johnsie Cancel as planned.     At [facility], you and your health needs are our priority.  As part of our continuing mission to provide you with exceptional heart care, we have created designated Provider Care Teams.  These Care Teams include your primary Cardiologist (physician) and Advanced Practice Providers (APPs -  Physician Assistants and Nurse Practitioners) who all work together to provide you with the care you need, when you need it.  Special Instructions:  . Stay safe, wash your hands for at least 20 seconds and wear a mask when needed.  . It was good to talk with you today.    Call the [place] office at [phone] if you have any questions, problems or concerns.

## 2020-01-23 ENCOUNTER — Encounter: Payer: Self-pay | Admitting: Internal Medicine

## 2020-01-23 ENCOUNTER — Other Ambulatory Visit: Payer: Self-pay

## 2020-01-23 ENCOUNTER — Telehealth: Payer: Self-pay | Admitting: Internal Medicine

## 2020-01-23 ENCOUNTER — Ambulatory Visit (INDEPENDENT_AMBULATORY_CARE_PROVIDER_SITE_OTHER): Payer: Medicare Other | Admitting: Internal Medicine

## 2020-01-23 VITALS — BP 118/66 | HR 66 | Temp 97.2°F | Ht 63.0 in | Wt 149.0 lb

## 2020-01-23 DIAGNOSIS — J4540 Moderate persistent asthma, uncomplicated: Secondary | ICD-10-CM | POA: Diagnosis not present

## 2020-01-23 DIAGNOSIS — Z79899 Other long term (current) drug therapy: Secondary | ICD-10-CM | POA: Diagnosis not present

## 2020-01-23 DIAGNOSIS — R059 Cough, unspecified: Secondary | ICD-10-CM

## 2020-01-23 DIAGNOSIS — D84821 Immunodeficiency due to drugs: Secondary | ICD-10-CM | POA: Diagnosis not present

## 2020-01-23 DIAGNOSIS — Z7185 Encounter for immunization safety counseling: Secondary | ICD-10-CM

## 2020-01-23 DIAGNOSIS — J449 Chronic obstructive pulmonary disease, unspecified: Secondary | ICD-10-CM

## 2020-01-23 LAB — PULMONARY FUNCTION TEST
DL/VA % pred: 108 %
DL/VA: 4.55 ml/min/mmHg/L
DLCO cor % pred: 96 %
DLCO cor: 18.38 ml/min/mmHg
DLCO unc % pred: 97 %
DLCO unc: 18.49 ml/min/mmHg
FEF 25-75 Post: 2.06 L/sec
FEF 25-75 Pre: 1.11 L/sec
FEF2575-%Change-Post: 85 %
FEF2575-%Pred-Post: 103 %
FEF2575-%Pred-Pre: 55 %
FEV1-%Change-Post: 19 %
FEV1-%Pred-Post: 79 %
FEV1-%Pred-Pre: 66 %
FEV1-Post: 1.81 L
FEV1-Pre: 1.51 L
FEV1FVC-%Change-Post: 3 %
FEV1FVC-%Pred-Pre: 94 %
FEV6-%Change-Post: 15 %
FEV6-%Pred-Post: 84 %
FEV6-%Pred-Pre: 73 %
FEV6-Post: 2.41 L
FEV6-Pre: 2.08 L
FEV6FVC-%Pred-Post: 104 %
FEV6FVC-%Pred-Pre: 104 %
FVC-%Change-Post: 15 %
FVC-%Pred-Post: 80 %
FVC-%Pred-Pre: 69 %
FVC-Post: 2.41 L
FVC-Pre: 2.08 L
Post FEV1/FVC ratio: 75 %
Post FEV6/FVC ratio: 100 %
Pre FEV1/FVC ratio: 72 %
Pre FEV6/FVC Ratio: 100 %
RV % pred: 142 %
RV: 2.97 L
TLC % pred: 109 %
TLC: 5.38 L

## 2020-01-23 LAB — SARS-COV-2 IGG: SARS-COV-2 IgG: 0.21

## 2020-01-23 LAB — NITRIC OXIDE: Nitric Oxide: 34

## 2020-01-23 MED ORDER — MOMETASONE FURO-FORMOTEROL FUM 200-5 MCG/ACT IN AERO: 2.0000 | INHALATION_SPRAY | Freq: Two times a day (BID) | RESPIRATORY_TRACT | 5 refills | Status: DC

## 2020-01-23 NOTE — Progress Notes (Signed)
Full PFT performed today. °

## 2020-01-23 NOTE — Addendum Note (Signed)
Addended by: Suzzanne Cloud E on: 01/23/2020 10:44 AM   Modules accepted: Orders

## 2020-01-23 NOTE — Telephone Encounter (Signed)
Her antibody response to the covid vaccine has turned negative - this does not mean the vaccine has lost protection. Is just that if she gets covid she will need some time to respond from the memory cells and that could make a differnce esp because she is immune suppressed  Plan  - recommend booster asap next few weeks to several weeks but atleast after being on dulera for > 1 week

## 2020-01-23 NOTE — Progress Notes (Signed)
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 Pulmonary Consult    Consult: Dr. Wilson Singer, intermittent chest tightness, no wheezing or cough   wheezng started roughly the same year she quit smoking assoc with wt gain x 35lb wt gain typically better p prednisone and better since spring 2017  On asthmanex 2 each am with rare saba needed but then Feb 10 2016 chest tightness and did not try saba > ER 02/21/16 with w/u neg w/u except for a/f levels both max sinuses> Rx Rosen= prednsione > back to baseline at time of ov with no cough or sob or chest tightness and  Not limited by breathing from desired activities including treadmill at 3.5 mph flat x 1.5 miles  Other than assoc of flares with cold/sinus infections >> No obvious patterns in day to day or daytime variability or assoc excess/ purulent sputum or mucus plugs or hemoptysis or cp or chest tightness, subjective wheeze or overt   hb symptoms. No unusual exp hx or h/o childhood pna/ asthma or knowledge of premature birth. 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 Follow-up    Pt states that her breathing has been doing well since last OV.  No new complaints. 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 Follow-up    Breathing is doing well. She has not had to use her albuterol inhaler at all and has only been using the symbicort 1-2 puffs every am.   symb 80 1-2 each am at most never using the 2 bid max  Not limited by breathing from desired activities   No cough   No obvious day to day or daytime  variability or assoc excess/ purulent sputum or mucus plugs or hemoptysis or cp or chest tightness, subjective wheeze or overt sinus or hb symptoms. No unusual exp hx or h/o childhood pna/ asthma or knowledge of premature birth.  Sleeping ok flat without nocturnal  or early am exacerbation  of respiratory  c/o's or need for noct saba. Also denies any obvious fluctuation of symptoms with weather or environmental changes or other aggravating or alleviating factors except as outlined above   Current Allergies, Complete Past Medical History, Past Surgical History, Family History, and Social History were reviewed in [employer] record.  ROS  The following are not active complaints unless bolded Hoarseness, sore throat, dysphagia, dental problems, itching, sneezing,  nasal congestion or discharge of excess mucus or purulent secretions,  ear ache,   fever, chills, sweats, unintended wt loss or wt gain, classically pleuritic or exertional cp,  orthopnea pnd or leg swelling, presyncope, palpitations, abdominal pain, anorexia, nausea, vomiting, diarrhea  or change in bowel habits or change in bladder habits, change in stools or change in urine, dysuria, hematuria,  rash, arthralgias, visual complaints, headache, numbness, weakness or ataxia or problems with walking or coordination,  change in mood/affect or memory.       OV 08/29/2019  Subjective:  Patient ID: Brandy Townsend, female , DOB: 14-Sep-1950 , age 61 y.o. , MRN: [id number] , ADDRESS: [place] [address]   08/29/2019 -   Chief Complaint  Patient presents with  . Consult    Abnormal CT, hx of asthma. hx of low oxygen level and cough.    Transfer of care from Dr. [name] to Dr. [name].  HPI Brandy Townsend 69 y.o. -referred for possible abnormalities in the CT scan of the chest in April 2021 in the setting of rheumatoid arthritis 2018 (RF + and CCP > 250 per [name] over phone) and being on [place] and longstanding history of allergic asthma on dulera. 20 pack former smoking   69 year old female.  Former Radio producer.  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  She reports a many year many decade history of allergic asthma which flares up in the fall.  Between episodes she is not on any maintenance treatment.  In the fall when it flares up her previous primary care physician Dr. [name] retired] would give her antibiotics and prednisone one course and this would help it.  However in 2020 when the flareup happened she could not get access to the prednisone and antibiotics because the primary care physician retired and in the setting of [place] it was televisit.  By the end of 2020 she ended up with 2 rounds of antibiotics and prednisone that helped clear things up.  In the interim in the last 4 years she has had a diagnosis of  rheumatoid arthritis.  She was on methotrexate but had raised liver function test.  This seemed to help.  She failed Plaquenil and then was started on leflunomide which she is on currently.   In 2018 she did have spirometry and DLCO on pulmonary function testing that shows hyperinflation and air trapping with reduced DLCO but also normal spirometry.  In the backdrop of this in January 2020 when she underwent her first Covid vaccine.  Then in early February 2020 when she had a second Covid vaccine.  24 hours later she got hypotensive tachycardic and also hypoxemic and ended up in the ER.  I reviewed the ER  notes.  She was observed in the ER for whole day and her symptoms and signs resolved and the hypoxemia also resolved.  She was discharged with a prednisone course.  She says subsequent to that she was short of breath and fatigue for 2 weeks and this also resolved.  She did see cardiology in February 2021.  She was reassured by a normal stress test.  After that she switched primary care physicians to Dr. Reynold Bowen.  She is now established with a new rheumatologist at [facility] and is seen [name] the 71 assistant for the first time on August 27, 2019 2 days ago.  She also had a thyroid biopsy which was nondiagnostic.  On the rheumatology visit 2 days ago it was felt her rheumatoid arthritis was active.  Blood has been drawn and work-up is in progress.  Through all this primary care physician did do CT chest without contrast.  I personally visualized this.  There is evidence of tree-in-bud that is mild and subtle.  Therefore she is referred here.  However at this point in time she is asymptomatic from a respiratory standpoint no wheezing no orthopnea no proximal nocturnal dyspnea no dyspnea on exertion no cough.  However in terms of rheumatoid arthritis there is plans to increase her immunomodulation. She has been started on low dose pred now by [name]. Currently labs  pending are CRP, Quant Gold and Hep Panel   D/w [name] later -> she is considering adding DMARD -discussed and she is supportive of getting bronch and rule out opportunistic infection  ROS - per HPI  IMPRESSION: CT chest visualized 1. Very mild, stable tree-in-bud appearing opacities along the anterolateral aspect of the bilateral upper lobes. Sequelae associated with atypical infection cannot be excluded. 2. Predominant stable thyroid goiter.  Aortic Atherosclerosis (ICD10-I70.0).   Electronically Signed   By: Virgina Norfolk M.D.   On: 07/22/2019 22:34    OV 10/17/2019  Subjective:  Patient ID: Brandy Townsend, female , DOB: [birth year] , age 89 y.o. , MRN: [id number] , ADDRESS: [place] [id number]   10/17/2019 -   Chief Complaint  Patient presents with  . Follow-up    Pt is here to discuss results of the bronch. Pt states she has been doing good since last visit and denies any complaints.     HPI DANA DEBO 69 y.o. -returns for follow-up.  At this point in time she is on prednisone 20 mg/day.  She has gained weight.  She is got some bruises.  She is not happy about her prednisone.  She denies any respiratory complaints.  Mid June 2020 when she underwent bronchoscopy with lavage.  I reviewed the results.  She is got some mild neutrophilia but organisms such as PCP and TB and fungus are negative.  She really wants to get started on a DMA RD so she can come down or get off prednisone.  I discussed with rheumatology PA [name] and she is of the understanding.  I stated there is no ILD.  They are going to focus on joint treatment.  Patient did indicate that in the fall she is at risk for respiratory exacerbations.  But she lives nearby and is agreeable to a 9-75-monthfollow-up.  She is supposed to get pulmonary function test today but that was not scheduled by our office.  Results for HLOLETTA, HARPER(MRN [id number] as of 10/17/2019 16:38  Ref.  Range 09/12/2019  08:59  Monocyte-Macrophage-Serous Fluid Latest Ref Range: 50 - 90 % 38 (L)  Other Cells, Fluid Latest Units: % CORRELATE WITH CYTOLOGY.  Fluid Type-FCT Unknown Bronch Lavag  Color, Fluid Latest Ref Range: YELLOW  COLORLESS (A)  Total Nucleated Cell Count, Fluid Latest Ref Range: 0 - 1,000 cu mm 15  Lymphs, Fluid Latest Units: % 4  Eos, Fluid Latest Units: % 2  Appearance, Fluid Latest Ref Range: CLEAR  CLOUDY (A)  Neutrophil Count, Fluid Latest Ref Range: 0 - 25 % 56 (H)   ROS - per HPI   Patient ID: Brandy Townsend, female    DOB: 10/23/1950, 69 y.o.   MRN: [id number]  Chief Complaint  Patient presents with  . Follow-up    Cough     Referring provider: Reynold Bowen, MD    12/10/2019 Acute OV : Cough  HPI: 69 year old female former smoker followed for abnormal CT chest with pulmonary infiltrates Medical history significant for rheumatoid arthritis    Patient presents for an acute office visit.  She complains of 1 week increased cough,congestion , and wheezing . Mucus is clear , hard to cough anything up.  She has underlying rheumatoid arthritis and is recently had her maintenance regimen changed to methotrexate in infliximab.    She had recent Covid test has been negative.  Her Covid vaccines are up-to-date.  She denies any fever or loss of taste or smell.   Patient is being followed for mild pulmonary infiltrates noted on CT chest.  She is negative for interstitial lung disease.  She underwent bronchoscopy in June 2021 that showed negative cultures and BAL.  Patient is followed by rheumatology and is on methotrexate and Renflexis     TEST/EVENTS :  In 2018 she did have spirometry and DLCO on pulmonary function testing that shows hyperinflation and air trapping with reduced DLCO but also normal spirometry.  CT chest July 22, 2019 showed very mild stable tree-in-bud appearing opacities along the bilateral upper lobes.  Bronchoscopy June 2021 showed cytology  positive for benign bronchial cells and pulmonary macrophages.,  AFB negative, BAL negative, M tubercular goes complex negative, fungal negative PCP negative   OV 01/23/2020  Subjective:  Patient ID: Brandy Townsend, female , DOB: September 09, 1950 , age 52 y.o. , MRN: [id number] , ADDRESS: [place] [id number] PCP Reynold Bowen, MD Patient Care Team: Reynold Bowen, MD as PCP - General (Endocrinology) Vania Rea, MD as PCP - OBGYN (Obstetrics and Gynecology) Josue Hector, MD as PCP - Cardiology (Cardiology)  This Provider for this visit: Treatment Team:  Attending Provider: Brand Males, MD    01/23/2020 -   Chief Complaint  Patient presents with  . Follow-up    Pt states she has been doing okay since last visit. States her breathing has been doing okay. Still becomes SOB but it happens out of nowhere and she isn't sure why or if it is coming due to her heart.   Rheumatoid arthritis with immunosuppression and mild pulmonary infiltrates.  BAL with neutrophilia blood culture negative-June 2021  Long history of allergic asthma previously on Dulera.  HPI Brandy Townsend 69 y.o. -returns for follow-up.  Since last seeing me she is seen nurse practitioner in September 2021.  At that time she was wheezing.  She was given Depo-Medrol and outpatient Omnicef.  She says within a few days she responded to this.  Since then she feels back to baseline but she says even at baseline she  is short of breath with exertion relieved by rest.  She says is not normal for her.  She recently had a cardiac cath and she reports this is normal.  I reviewed the chart and agree with that.  She is also dealing with her husband has esophageal cancer.  Recently was in the ER because of' false Hyperkalemia according to history.  She is currently on TNF alpha blockade and methotrexate for her rheumatoid arthritis.  She says the joint symptoms have not fully improved.  She is waiting for the third  dose of the TNF alpha blockade before she is expecting to show improvement in her symptoms.  Overall the last few months have been high level of social stress.  She thinks it is slowly settling down.  She feels like things are changed after getting her Covid vaccine.  Nevertheless she understands she is immunosuppressed and she is in need of a booster.  She has some trepidation about getting the booster.  She is willing to have a IgG antibody levels checked as a means of triage her understanding antibody response to previous Covid vaccine.  Today she pulmonary function test in my view shows obstruction with significant bronchodilator response consistent with an asthma pattern.  Documented below.  I reviewed the graph.  Nitric oxide test and asthma control test scores are listed below and shows disease activity   Asthma Control Test ACT Total Score  01/23/2020 18  08/29/2019 24     Lab Results  Component Value Date   NITRICOXIDE 34 01/23/2020         PFT Results Latest Ref Rng & Units 01/23/2020 01/13/2017  FVC-Pre L 2.08 2.43  FVC-Predicted Pre % 69 79  FVC-Post L 2.41 2.46  FVC-Predicted Post % 80 80  Pre FEV1/FVC % % 72 73  Post FEV1/FCV % % 75 76  FEV1-Pre L 1.51 1.77  FEV1-Predicted Pre % 66 75  FEV1-Post L 1.81 1.87  DLCO uncorrected ml/min/mmHg 18.49 16.68  DLCO UNC% % 97 72  DLCO corrected ml/min/mmHg 18.38 17.68  DLCO COR %Predicted % 96 77  DLVA Predicted % 108 83  TLC L 5.38 5.90  TLC % Predicted % 109 120  RV % Predicted % 142 162   Lab Results  Component Value Date   NITRICOXIDE 33 03/23/2016   Results for Mccrackin, MIKAYLAH LIBBEY (MRN [id number]) as of 01/23/2020 10:05  Ref. Range 02/01/2017 20:32 09/02/2018 15:40 05/08/2019 11:00 07/18/2019 10:06 07/19/2019 04:31 07/27/2019 11:05 01/01/2020 06:10 01/08/2020 10:14  Eosinophils Absolute Latest Ref Range: 0.0 - 0.5 K/uL  0.3 0.5    0.3     ROS - per HPI     has a past medical history of Asthma, CAD (coronary artery disease),  Graves disease, Hypertension, Osteoporosis, Rheumatoid arthritis ([place]), Thyroid disease, and Vertigo.   reports that she quit smoking about 10 years ago. Her smoking use included cigarettes. She has a 20.00 pack-year smoking history. She has never used smokeless tobacco.  Past Surgical History:  Procedure Laterality Date  . BRONCHIAL WASHINGS  09/12/2019   Procedure: BRONCHIAL WASHINGS;  Surgeon: Brand Males, MD;  Location: [facility];  Service: Endoscopy;;  . INTRAVASCULAR PRESSURE WIRE/FFR STUDY N/A 01/08/2020   Procedure: INTRAVASCULAR PRESSURE WIRE/FFR STUDY;  Surgeon: Martinique, Peter M, MD;  Location: [facility];  Service: Cardiovascular;  Laterality: N/A;  . LEFT HEART CATH AND CORONARY ANGIOGRAPHY N/A 01/08/2020   Procedure: LEFT HEART CATH AND CORONARY ANGIOGRAPHY;  Surgeon: Martinique, Peter M, MD;  Location: [facility];  Service: Cardiovascular;  Laterality: N/A;  . VIDEO BRONCHOSCOPY N/A 09/12/2019   Procedure: VIDEO BRONCHOSCOPY WITHOUT FLUORO;  Surgeon: Brand Males, MD;  Location: [facility];  Service: Endoscopy;  Laterality: N/A;    Allergies  Allergen Reactions  . Macrobid [employer] Macro] Other (See Comments)    Lowered potassium, caused aches and pains    Immunization History  Administered Date(s) Administered  . Fluad Quad(high Dose 65+) 01/20/2020  . Influenza Whole 01/04/2017  . Influenza, High Dose Seasonal PF 12/27/2018  . Influenza-Unspecified 12/26/2013, 12/27/2014, 12/27/2015  . PFIZER SARS-COV-2 Vaccination 04/17/2019, 05/06/2019  . Pneumococcal Conjugate-13 04/28/2014  . Zoster 04/01/2019    No family history on file.   Current Outpatient Medications:  .  acetaminophen (TYLENOL) 500 MG tablet, Take 500 mg by mouth every 6 (six) hours as needed for moderate pain., Disp: , Rfl:  .  albuterol (VENTOLIN HFA) 108 (90 Base) MCG/ACT inhaler, Inhale 1-2 puffs into the lungs every 6 (six) hours as needed for wheezing or  shortness of breath., Disp: 8 g, Rfl: 2 .  aspirin EC 81 MG tablet, Take 1 tablet (81 mg total) by mouth daily. Swallow whole., Disp: 90 tablet, Rfl: 3 .  cetirizine (ZYRTEC) 10 MG tablet, Take 10 mg by mouth at bedtime as needed for allergies. , Disp: , Rfl:  .  folic acid (FOLVITE) 1 MG tablet, Take 3 mg by mouth daily., Disp: , Rfl:  .  ibuprofen (ADVIL) 200 MG tablet, Take 200 mg by mouth every 6 (six) hours as needed for mild pain (inflammation pain). , Disp: , Rfl:  .  inFLIXimab-abda (RENFLEXIS IV), Inject 1 ampule into the vein once a week. Every two weeks, then once every six weeks , Disp: , Rfl:  .  KLOR-CON 10 10 MEQ tablet, Take 10 mEq by mouth daily., Disp: , Rfl: 0 .  levothyroxine (SYNTHROID) 50 MCG tablet, Take 50 mcg by mouth daily. , Disp: , Rfl:  .  methotrexate (RHEUMATREX) 2.5 MG tablet, Take 15 mg by mouth once a week. Monday evening, Disp: , Rfl:  .  ondansetron (ZOFRAN ODT) 4 MG disintegrating tablet, Take 1 tablet (4 mg total) by mouth every 8 (eight) hours as needed for nausea or vomiting., Disp: 20 tablet, Rfl: 0 .  pyridOXINE (VITAMIN B-6) 100 MG tablet, Take 100 mg by mouth daily., Disp: , Rfl:  .  rosuvastatin (CRESTOR) 10 MG tablet, Take 1 tablet (10 mg total) by mouth daily., Disp: 90 tablet, Rfl: 3 .  torsemide (DEMADEX) 20 MG tablet, Take 1 tablet (20 mg total) by mouth daily., Disp: 90 tablet, Rfl: 3 .  verapamil (CALAN-SR) 240 MG CR tablet, Take 1 tablet (240 mg total) by mouth at bedtime., Disp: , Rfl:  .  Vitamin D, Ergocalciferol, (DRISDOL) 1.25 MG (50000 UNIT) CAPS capsule, Take 50,000 Units by mouth every 7 (seven) days. Monday, Disp: , Rfl:  .  alendronate (FOSAMAX) 70 MG tablet, Take 70 mg by mouth once a week. (Patient not taking: Reported on 01/23/2020), Disp: , Rfl:  .  mometasone-formoterol (DULERA) 200-5 MCG/ACT AERO, Inhale 2 puffs into the lungs in the morning and at bedtime., Disp: 13 g, Rfl: 5      Objective:   Vitals:   01/23/20 1000  BP:  118/66  Pulse: 66  Temp: (!) 97.2 F (36.2 C)  TempSrc: Other (Comment)  SpO2: 98%  Weight: 149 lb (67.6 kg)  Height: _0  (1.6 m)  Estimated body mass index is 26.39 kg/m as calculated from the following:   Height as of this encounter: _0  (1.6 m).   Weight as of this encounter: 149 lb (67.6 kg).  _1 @  Filed Weights   01/23/20 1000  Weight: 149 lb (67.6 kg)     Physical Exam   General: No distress. Looks well Neuro: Alert and Oriented x 3. GCS 15. Speech normal Psych: Pleasant Resp:  Barrel Chest - no.  Wheeze - no, Crackles - no, No overt respiratory distress CVS: Normal heart sounds. Murmurs - no Ext: Stigmata of Connective Tissue Disease - no HEENT: Normal upper airway. PEERL +. No post nasal drip        Assessment:       ICD-10-CM   1. Moderate persistent asthma without complication  O35.00 Nitric oxide    SARS-COV-2 IgG  2. Vaccine counseling  Z71.85 SARS-COV-2 IgG  3. Immunosuppression due to drug therapy [facility])  X38.182    X93.716        Plan:     Patient Instructions  Moderate persistent asthma without complication  - I think you have asthma. Other possibility is BO due to RA but sounds like this is allergic asthma and your shortness of breath with exertion and recent flare up can be bcuase of this  Plan - do feno test 01/23/2020  - do ACT score 01/23/2020  - start dulera 200/4.5 , 2 puff twice daily  - learn technique, rinse mouth after use - albuterol is only as needed - depending on level of response, can consider blood IgE, rAST allergy panel and eoisnophil check at some point during followup  Vaccine counseling Immunosuppression due to drug therapy ([place])  - definitely recommend covid booster but understand concern over side efect  Plan  - check covid IgG blood level 01/23/2020 - if negative then there is more of an urgency to get the booster  Followup  -8 weeks for folllowup  - do ACT test, and feno at  followup   ( Level 05 visit: Estb 40-54 min in  visit type: on-site physical face to visit  in total care time and counseling or/and coordination of care by this undersigned MD - Dr Brand Males. This includes one or more of the following on this same day 01/23/2020: pre-charting, chart review, note writing, documentation discussion of test results, diagnostic or treatment recommendations, prognosis, risks and benefits of management options, instructions, education, compliance or risk-factor reduction. It excludes time spent by the [place] or office staff in the care of the patient. Actual time 45 min)   SIGNATURE    Dr. Brand Males, M.D., F.C.C.P,  Pulmonary and Critical Care Medicine Staff Physician, [place] Director - Interstitial Lung Disease  Program  Pulmonary [place] [facility] [place], [place], [address]  Pager: [phone], If no answer or between  15:00h - 7:00h: call [phone] Telephone: [phone]  10:33 AM 01/23/2020

## 2020-01-23 NOTE — Patient Instructions (Addendum)
Moderate persistent asthma without complication  - I think you have asthma. Other possibility is BO due to RA but sounds like this is allergic asthma and your shortness of breath with exertion and recent flare up can be bcuase of this  Plan - do feno test 01/23/2020  - do ACT score 01/23/2020  - start dulera 200/4.5 , 2 puff twice daily  - learn technique, rinse mouth after use - albuterol is only as needed - depending on level of response, can consider blood IgE, rAST allergy panel and eoisnophil check at some point during followup  Vaccine counseling Immunosuppression due to drug therapy ([place])  - definitely recommend covid booster but understand concern over side efect  Plan  - check covid IgG blood level 01/23/2020 - if negative then there is more of an urgency to get the booster  Followup  -8 weeks for folllowup  - do ACT test, and feno at followup

## 2020-01-23 NOTE — Telephone Encounter (Signed)
Called and spoke with pt letting her know the results of labwork and other info stated by MR. Pt verbalized understanding. Nothing further needed.

## 2020-01-27 DIAGNOSIS — M0579 Rheumatoid arthritis with rheumatoid factor of multiple sites without organ or systems involvement: Secondary | ICD-10-CM | POA: Diagnosis not present

## 2020-01-31 ENCOUNTER — Ambulatory Visit (INDEPENDENT_AMBULATORY_CARE_PROVIDER_SITE_OTHER): Payer: Medicare Other

## 2020-01-31 ENCOUNTER — Other Ambulatory Visit: Payer: Self-pay

## 2020-01-31 DIAGNOSIS — Z23 Encounter for immunization: Secondary | ICD-10-CM | POA: Diagnosis not present

## 2020-02-07 DIAGNOSIS — E878 Other disorders of electrolyte and fluid balance, not elsewhere classified: Secondary | ICD-10-CM | POA: Diagnosis not present

## 2020-02-27 DIAGNOSIS — E782 Mixed hyperlipidemia: Secondary | ICD-10-CM

## 2020-02-27 DIAGNOSIS — R0600 Dyspnea, unspecified: Secondary | ICD-10-CM

## 2020-02-27 DIAGNOSIS — R0609 Other forms of dyspnea: Secondary | ICD-10-CM

## 2020-03-09 ENCOUNTER — Other Ambulatory Visit: Payer: Medicare Other

## 2020-03-10 DIAGNOSIS — Z79899 Other long term (current) drug therapy: Secondary | ICD-10-CM | POA: Diagnosis not present

## 2020-03-10 DIAGNOSIS — R9389 Abnormal findings on diagnostic imaging of other specified body structures: Secondary | ICD-10-CM | POA: Diagnosis not present

## 2020-03-10 DIAGNOSIS — M810 Age-related osteoporosis without current pathological fracture: Secondary | ICD-10-CM | POA: Diagnosis not present

## 2020-03-10 DIAGNOSIS — M0579 Rheumatoid arthritis with rheumatoid factor of multiple sites without organ or systems involvement: Secondary | ICD-10-CM | POA: Diagnosis not present

## 2020-03-11 ENCOUNTER — Encounter: Payer: Self-pay | Admitting: Internal Medicine

## 2020-03-11 ENCOUNTER — Other Ambulatory Visit: Payer: Self-pay

## 2020-03-11 ENCOUNTER — Ambulatory Visit: Payer: Medicare Other | Admitting: Internal Medicine

## 2020-03-11 VITALS — BP 112/60 | HR 79 | Temp 97.4°F | Ht 63.0 in | Wt 153.2 lb

## 2020-03-11 DIAGNOSIS — Z79899 Other long term (current) drug therapy: Secondary | ICD-10-CM | POA: Diagnosis not present

## 2020-03-11 DIAGNOSIS — J4540 Moderate persistent asthma, uncomplicated: Secondary | ICD-10-CM

## 2020-03-11 DIAGNOSIS — D84821 Immunodeficiency due to drugs: Secondary | ICD-10-CM | POA: Diagnosis not present

## 2020-03-11 DIAGNOSIS — Z8739 Personal history of other diseases of the musculoskeletal system and connective tissue: Secondary | ICD-10-CM

## 2020-03-11 LAB — SARS-COV-2 IGG: SARS-COV-2 IgG: 10.69

## 2020-03-11 NOTE — Patient Instructions (Signed)
Moderate persistent asthma without complication  - well controlled clinically with dulera - no oral thrush  Plan - continue dulera 200/4.5 , 2 puff twice daily - albuterol is only as needed -due to good response hold off blood IgE, rAST allergy panel and eoisnophil check  At this moment  Vaccine counseling Immunosuppression due to drug therapy [facility])  - glad you had booster shot mid nov 2021  Plan  - check covid IgG blood level 03/11/2020= -  if negative can consider [place] MAB or enrollment in 2022 in [facility] MAB trial  Followup  -will call with results  - return in 6 months or sooner if needed

## 2020-03-11 NOTE — Progress Notes (Signed)
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 Pulmonary Consult    Consult: Dr. Wilson Singer, intermittent chest tightness, no wheezing or cough   wheezng started roughly the same year she quit smoking assoc with wt gain x 35lb wt gain typically better p prednisone and better since spring 2017  On asthmanex 2 each am with rare saba needed but then Feb 10 2016 chest tightness and did not try saba > ER 02/21/16 with w/u neg w/u except for a/f levels both max sinuses> Rx Rosen= prednsione > back to baseline at time of ov with no cough or sob or chest tightness and  Not limited by breathing from desired activities including treadmill at 3.5 mph flat x 1.5 miles  Other than assoc of flares with cold/sinus infections >> No obvious patterns in day to day or daytime variability or assoc excess/ purulent sputum or mucus plugs or hemoptysis or cp or chest tightness, subjective wheeze or overt   hb symptoms. No unusual exp hx or h/o childhood pna/ asthma or knowledge of premature birth. 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 Follow-up    Pt states that her breathing has been doing well since last OV.  No new complaints. 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 Follow-up    Breathing is doing well. She has not had to use her albuterol inhaler at all and has only been using the symbicort 1-2 puffs every am.   symb 80 1-2 each am at most never using the 2 bid max  Not limited by breathing from desired activities   No cough   No obvious day to day or daytime  variability or assoc excess/ purulent sputum or mucus plugs or hemoptysis or cp or chest tightness, subjective wheeze or overt sinus or hb symptoms. No unusual exp hx or h/o childhood pna/ asthma or knowledge of premature birth.  Sleeping ok flat without nocturnal  or early am exacerbation  of respiratory  c/o's or need for noct saba. Also denies any obvious fluctuation of symptoms with weather or environmental changes or other aggravating or alleviating factors except as outlined above   Current Allergies, Complete Past Medical History, Past Surgical History, Family History, and Social History were reviewed in [employer] record.  ROS  The following are not active complaints unless bolded Hoarseness, sore throat, dysphagia, dental problems, itching, sneezing,  nasal congestion or discharge of excess mucus or purulent secretions,  ear ache,   fever, chills, sweats, unintended wt loss or wt gain, classically pleuritic or exertional cp,  orthopnea pnd or leg swelling, presyncope, palpitations, abdominal pain, anorexia, nausea, vomiting, diarrhea  or change in bowel habits or change in bladder habits, change in stools or change in urine, dysuria, hematuria,  rash, arthralgias, visual complaints, headache, numbness, weakness or ataxia or problems with walking or coordination,  change in mood/affect or memory.       OV 08/29/2019  Subjective:  Patient ID: Brandy Townsend, female , DOB: Jun 21, 1950 , age 69 y.o. , MRN: [id number] , ADDRESS: [place] [address]   08/29/2019 -   Chief Complaint  Patient presents with  . Consult    Abnormal CT, hx of asthma. hx of low oxygen level and cough.    Transfer of care from Dr. [name] to Dr. [name].  HPI Brandy Townsend 69 y.o. -referred for possible abnormalities in the CT scan of the chest in April 2021 in the setting of rheumatoid arthritis 2018 (RF + and CCP > 250 per [name] over phone) and being on [place] and longstanding history of allergic asthma on dulera. 20 pack former smoking   69 year old female.  Former Radio producer.  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  She reports a many year many decade history of allergic asthma which flares up in the fall.  Between episodes she is not on any maintenance treatment.  In the fall when it flares up her previous primary care physician Dr. [name] retired] would give her antibiotics and prednisone one course and this would help it.  However in 2020 when the flareup happened she could not get access to the prednisone and antibiotics because the primary care physician retired and in the setting of [place] it was televisit.  By the end of 2020 she ended up with 2 rounds of antibiotics and prednisone that helped clear things up.  In the interim in the last 4 years she has had a diagnosis of  rheumatoid arthritis.  She was on methotrexate but had raised liver function test.  This seemed to help.  She failed Plaquenil and then was started on leflunomide which she is on currently.   In 2018 she did have spirometry and DLCO on pulmonary function testing that shows hyperinflation and air trapping with reduced DLCO but also normal spirometry.  In the backdrop of this in January 2020 when she underwent her first Covid vaccine.  Then in early February 2020 when she had a second Covid vaccine.  24 hours later she got hypotensive tachycardic and also hypoxemic and ended up in the ER.  I reviewed the ER  notes.  She was observed in the ER for whole day and her symptoms and signs resolved and the hypoxemia also resolved.  She was discharged with a prednisone course.  She says subsequent to that she was short of breath and fatigue for 2 weeks and this also resolved.  She did see cardiology in February 2021.  She was reassured by a normal stress test.  After that she switched primary care physicians to Dr. Reynold Bowen.  She is now established with a new rheumatologist at [facility] and is seen [name] the 71 assistant for the first time on August 27, 2019 2 days ago.  She also had a thyroid biopsy which was nondiagnostic.  On the rheumatology visit 2 days ago it was felt her rheumatoid arthritis was active.  Blood has been drawn and work-up is in progress.  Through all this primary care physician did do CT chest without contrast.  I personally visualized this.  There is evidence of tree-in-bud that is mild and subtle.  Therefore she is referred here.  However at this point in time she is asymptomatic from a respiratory standpoint no wheezing no orthopnea no proximal nocturnal dyspnea no dyspnea on exertion no cough.  However in terms of rheumatoid arthritis there is plans to increase her immunomodulation. She has been started on low dose pred now by [name]. Currently labs  pending are CRP, Quant Gold and Hep Panel   D/w [name] later -> she is considering adding DMARD -discussed and she is supportive of getting bronch and rule out opportunistic infection  ROS - per HPI  IMPRESSION: CT chest visualized 1. Very mild, stable tree-in-bud appearing opacities along the anterolateral aspect of the bilateral upper lobes. Sequelae associated with atypical infection cannot be excluded. 2. Predominant stable thyroid goiter.  Aortic Atherosclerosis (ICD10-I70.0).   Electronically Signed   By: Virgina Norfolk M.D.   On: 07/22/2019 22:34    OV 10/17/2019  Subjective:  Patient ID: Brandy Townsend, female , DOB: [birth year] , age 89 y.o. , MRN: [id number] , ADDRESS: [place] [id number]   10/17/2019 -   Chief Complaint  Patient presents with  . Follow-up    Pt is here to discuss results of the bronch. Pt states she has been doing good since last visit and denies any complaints.     HPI Brandy Townsend 69 y.o. -returns for follow-up.  At this point in time she is on prednisone 20 mg/day.  She has gained weight.  She is got some bruises.  She is not happy about her prednisone.  She denies any respiratory complaints.  Mid June 2020 when she underwent bronchoscopy with lavage.  I reviewed the results.  She is got some mild neutrophilia but organisms such as PCP and TB and fungus are negative.  She really wants to get started on a DMA RD so she can come down or get off prednisone.  I discussed with rheumatology PA [name] and she is of the understanding.  I stated there is no ILD.  They are going to focus on joint treatment.  Patient did indicate that in the fall she is at risk for respiratory exacerbations.  But she lives nearby and is agreeable to a 9-75-monthfollow-up.  She is supposed to get pulmonary function test today but that was not scheduled by our office.  Results for Brandy Townsend, Brandy Townsend(MRN [id number] as of 10/17/2019 16:38  Ref.  Range 09/12/2019  08:59  Monocyte-Macrophage-Serous Fluid Latest Ref Range: 50 - 90 % 38 (L)  Other Cells, Fluid Latest Units: % CORRELATE WITH CYTOLOGY.  Fluid Type-FCT Unknown Bronch Lavag  Color, Fluid Latest Ref Range: YELLOW  COLORLESS (A)  Total Nucleated Cell Count, Fluid Latest Ref Range: 0 - 1,000 cu mm 15  Lymphs, Fluid Latest Units: % 4  Eos, Fluid Latest Units: % 2  Appearance, Fluid Latest Ref Range: CLEAR  CLOUDY (A)  Neutrophil Count, Fluid Latest Ref Range: 0 - 25 % 56 (H)   ROS - per HPI   Patient ID: Brandy Townsend, female    DOB: [birth year], 69 y.o.   MRN: [id number]  Chief Complaint  Patient presents with  . Follow-up    Cough     Referring provider: Reynold Bowen, MD    12/10/2019 Acute OV : Cough  HPI: 70 year old female former smoker followed for abnormal CT chest with pulmonary infiltrates Medical history significant for rheumatoid arthritis    Patient presents for an acute office visit.  She complains of 1 week increased cough,congestion , and wheezing . Mucus is clear , hard to cough anything up.  She has underlying rheumatoid arthritis and is recently had her maintenance regimen changed to methotrexate in infliximab.    She had recent Covid test has been negative.  Her Covid vaccines are up-to-date.  She denies any fever or loss of taste or smell.   Patient is being followed for mild pulmonary infiltrates noted on CT chest.  She is negative for interstitial lung disease.  She underwent bronchoscopy in June 2021 that showed negative cultures and BAL.  Patient is followed by rheumatology and is on methotrexate and Renflexis     TEST/EVENTS :  In 2018 she did have spirometry and DLCO on pulmonary function testing that shows hyperinflation and air trapping with reduced DLCO but also normal spirometry.  CT chest July 22, 2019 showed very mild stable tree-in-bud appearing opacities along the bilateral upper lobes.  Bronchoscopy June 2021 showed cytology  positive for benign bronchial cells and pulmonary macrophages.,  AFB negative, BAL negative, M tubercular goes complex negative, fungal negative PCP negative   OV 01/23/2020  Subjective:  Patient ID: Brandy Townsend, female , DOB: 04-03-1950 , age 30 y.o. , MRN: [id number] , ADDRESS: [place] [id number] PCP Reynold Bowen, MD Patient Care Team: Reynold Bowen, MD as PCP - General (Endocrinology) Vania Rea, MD as PCP - OBGYN (Obstetrics and Gynecology) Josue Hector, MD as PCP - Cardiology (Cardiology)  This Provider for this visit: Treatment Team:  Attending Provider: Brand Males, MD    01/23/2020 -   Chief Complaint  Patient presents with  . Follow-up    Pt states she has been doing okay since last visit. States her breathing has been doing okay. Still becomes SOB but it happens out of nowhere and she isn't sure why or if it is coming due to her heart.   Rheumatoid arthritis with immunosuppression and mild pulmonary infiltrates.  BAL with neutrophilia blood culture negative-June 2021  Long history of allergic asthma previously on Dulera.  HPI Brandy Townsend 69 y.o. -returns for follow-up.  Since last seeing me she is seen nurse practitioner in September 2021.  At that time she was wheezing.  She was given Depo-Medrol and outpatient Omnicef.  She says within a few days she responded to this.  Since then she feels back to baseline but she says even at baseline she  is short of breath with exertion relieved by rest.  She says is not normal for her.  She recently had a cardiac cath and she reports this is normal.  I reviewed the chart and agree with that.  She is also dealing with her husband has esophageal cancer.  Recently was in the ER because of' false Hyperkalemia according to history.  She is currently on TNF alpha blockade and methotrexate for her rheumatoid arthritis.  She says the joint symptoms have not fully improved.  She is waiting for the third  dose of the TNF alpha blockade before she is expecting to show improvement in her symptoms.  Overall the last few months have been high level of social stress.  She thinks it is slowly settling down.  She feels like things are changed after getting her Covid vaccine.  Nevertheless she understands she is immunosuppressed and she is in need of a booster.  She has some trepidation about getting the booster.  She is willing to have a IgG antibody levels checked as a means of triage her understanding antibody response to previous Covid vaccine.  Today she pulmonary function test in my view shows obstruction with significant bronchodilator response consistent with an asthma pattern.  Documented below.  I reviewed the graph.  Nitric oxide test and asthma control test scores are listed below and shows disease activity   Asthma Control Test ACT Total Score  01/23/2020 18  08/29/2019 24     Lab Results  Component Value Date   NITRICOXIDE 34 01/23/2020         PFT Results Latest Ref Rng & Units 01/23/2020 01/13/2017  FVC-Pre L 2.08 2.43  FVC-Predicted Pre % 69 79  FVC-Post L 2.41 2.46  FVC-Predicted Post % 80 80  Pre FEV1/FVC % % 72 73  Post FEV1/FCV % % 75 76  FEV1-Pre L 1.51 1.77  FEV1-Predicted Pre % 66 75  FEV1-Post L 1.81 1.87  DLCO uncorrected ml/min/mmHg 18.49 16.68  DLCO UNC% % 97 72  DLCO corrected ml/min/mmHg 18.38 17.68  DLCO COR %Predicted % 96 77  DLVA Predicted % 108 83  TLC L 5.38 5.90  TLC % Predicted % 109 120  RV % Predicted % 142 162   Lab Results  Component Value Date   NITRICOXIDE 33 03/23/2016   Results for Brandy Townsend, Brandy Townsend (MRN [id number]) as of 01/23/2020 10:05  Ref. Range 02/01/2017 20:32 09/02/2018 15:40 05/08/2019 11:00 07/18/2019 10:06 07/19/2019 04:31 07/27/2019 11:05 01/01/2020 06:10 01/08/2020 10:14  Eosinophils Absolute Latest Ref Range: 0.0 - 0.5 K/uL  0.3 0.5    0.3     OV 03/11/2020  Subjective:  Patient ID: Brandy Townsend, female , DOB: October 22, 1950 , age 69  y.o. , MRN: [id number] , ADDRESS: [place] [id number] PCP Reynold Bowen, MD Patient Care Team: Reynold Bowen, MD as PCP - General (Endocrinology) Vania Rea, MD as PCP - OBGYN (Obstetrics and Gynecology) Josue Hector, MD as PCP - Cardiology (Cardiology)  This Provider for this visit: Treatment Team:  Attending Provider: Brand Males, MD    03/11/2020 -   Chief Complaint  Patient presents with  . Follow-up    Patient wants to get her covid antibodies checked today since she got your booster. Feels good overall    Rheumatoid arthritis with immunosuppression and mild pulmonary infiltrates.  BAL with neutrophilia blood culture negative-June 2021  Long history of allergic asthma previously on Dulera.  And recommenced [facility] in October 2021  HPI Brandy Townsend 69 y.o. -returns for follow-up after starting [facility] for asthma.  She says the Ruthe Mannan has worked well.  When she takes a deep breath she does not feel dizzy.  Her ACT control score has improved to 24.  Her main issue is rheumatoid arthritis.  She is now on it TNF alpha blockade.  This is controlling the pain but she feels the dose was suboptimal and ago and increase the dose.  Additionally she has had a Covid booster mRNA vaccine in mid November 2021.  Previously her IgG antibody after the second dose was negative.  She now wants to check IgG level after the third dose.  We did talk about new literature showing the FDA has approved under emergency use authorization AstraZeneca monoclonal antibody with a long half-life of 6 months as preexposure prophylaxis.  There are also emerging clinical trials for patients of had a vaccine who are IgG negative in response.  She is interested in both options  Asthma Control Test ACT Total Score  03/11/2020 24  01/23/2020 18  08/29/2019 24      Lab Results  Component Value Date   NITRICOXIDE 34 01/23/2020   PFT Results Latest Ref Rng & Units 01/23/2020  01/13/2017  FVC-Pre L 2.08 2.43  FVC-Predicted Pre % 69 79  FVC-Post L 2.41 2.46  FVC-Predicted Post % 80 80  Pre FEV1/FVC % % 72 73  Post FEV1/FCV % % 75 76  FEV1-Pre L 1.51 1.77  FEV1-Predicted Pre % 66 75  FEV1-Post L 1.81 1.87  DLCO uncorrected ml/min/mmHg 18.49 16.68  DLCO UNC% % 97 72  DLCO corrected ml/min/mmHg 18.38 17.68  DLCO COR %Predicted % 96 77  DLVA Predicted % 108 83  TLC L 5.38 5.90  TLC % Predicted % 109 120  RV % Predicted % 142 162      ROS - per HPI     has a past medical history of Asthma, CAD (coronary artery disease), Graves disease, Hypertension, Osteoporosis, Rheumatoid arthritis ([place]), Thyroid disease, and Vertigo.   reports that she quit smoking about 10 years ago. Her smoking use included cigarettes. She has a 20.00 pack-year smoking history. She has never used smokeless tobacco.  Past Surgical History:  Procedure Laterality Date  . BRONCHIAL WASHINGS  09/12/2019   Procedure: BRONCHIAL WASHINGS;  Surgeon: Brand Males, MD;  Location: [facility];  Service: Endoscopy;;  . INTRAVASCULAR PRESSURE WIRE/FFR STUDY N/A 01/08/2020   Procedure: INTRAVASCULAR PRESSURE WIRE/FFR STUDY;  Surgeon: Martinique, Peter M, MD;  Location: [facility];  Service: Cardiovascular;  Laterality: N/A;  . LEFT HEART CATH AND CORONARY ANGIOGRAPHY N/A 01/08/2020   Procedure: LEFT HEART CATH AND CORONARY ANGIOGRAPHY;  Surgeon: Martinique, Peter M, MD;  Location: [facility];  Service: Cardiovascular;  Laterality: N/A;  . VIDEO BRONCHOSCOPY N/A 09/12/2019   Procedure: VIDEO BRONCHOSCOPY WITHOUT FLUORO;  Surgeon: Brand Males, MD;  Location: [facility];  Service: Endoscopy;  Laterality: N/A;    Allergies  Allergen Reactions  . Macrobid [employer] Macro] Other (See Comments)    Lowered potassium, caused aches and pains    Immunization History  Administered Date(s) Administered  . Fluad Quad(high Dose 65+) 01/20/2020  . Influenza Whole  01/04/2017  . Influenza, High Dose Seasonal PF 01/03/2017, 12/27/2018  . Influenza, Quadrivalent, Recombinant, Inj, Pf 01/09/2018, 01/11/2019  . Influenza,inj,quad, With Preservative 12/26/2016  . Influenza-Unspecified 12/26/2013, 12/27/2014, 12/27/2015  . PFIZER SARS-COV-2 Vaccination 04/17/2019, 05/06/2019, 01/31/2020  . Pneumococcal Conjugate-13 04/28/2014, 05/26/2014  .  Zoster 05/22/2013, 04/01/2019  . Zoster Recombinat (Shingrix) 01/14/2019, 04/02/2019    History reviewed. No pertinent family history.   Current Outpatient Medications:  .  acetaminophen (TYLENOL) 500 MG tablet, Take 500 mg by mouth every 6 (six) hours as needed for moderate pain., Disp: , Rfl:  .  albuterol (VENTOLIN HFA) 108 (90 Base) MCG/ACT inhaler, Inhale 1-2 puffs into the lungs every 6 (six) hours as needed for wheezing or shortness of breath., Disp: 8 g, Rfl: 2 .  alendronate (FOSAMAX) 70 MG tablet, Take 70 mg by mouth once a week., Disp: , Rfl:  .  aspirin EC 81 MG tablet, Take 1 tablet (81 mg total) by mouth daily. Swallow whole., Disp: 90 tablet, Rfl: 3 .  cetirizine (ZYRTEC) 10 MG tablet, Take 10 mg by mouth at bedtime as needed for allergies. , Disp: , Rfl:  .  folic acid (FOLVITE) 1 MG tablet, Take 3 mg by mouth daily., Disp: , Rfl:  .  ibuprofen (ADVIL) 200 MG tablet, Take 200 mg by mouth every 6 (six) hours as needed for mild pain (inflammation pain). , Disp: , Rfl:  .  inFLIXimab-abda (RENFLEXIS IV), Inject 1 ampule into the vein once a week. Every two weeks, then once every six weeks, Disp: , Rfl:  .  KLOR-CON 10 10 MEQ tablet, Take 10 mEq by mouth daily., Disp: , Rfl: 0 .  levothyroxine (SYNTHROID) 50 MCG tablet, Take 50 mcg by mouth daily. , Disp: , Rfl:  .  methotrexate (RHEUMATREX) 2.5 MG tablet, Take 15 mg by mouth once a week. Monday evening, Disp: , Rfl:  .  mometasone-formoterol (DULERA) 200-5 MCG/ACT AERO, Inhale 2 puffs into the lungs in the morning and at bedtime., Disp: 13 g, Rfl: 5 .   ondansetron (ZOFRAN ODT) 4 MG disintegrating tablet, Take 1 tablet (4 mg total) by mouth every 8 (eight) hours as needed for nausea or vomiting., Disp: 20 tablet, Rfl: 0 .  pyridOXINE (VITAMIN B-6) 100 MG tablet, Take 100 mg by mouth daily., Disp: , Rfl:  .  rosuvastatin (CRESTOR) 10 MG tablet, Take 1 tablet (10 mg total) by mouth daily., Disp: 90 tablet, Rfl: 3 .  torsemide (DEMADEX) 20 MG tablet, Take 1 tablet (20 mg total) by mouth daily., Disp: 90 tablet, Rfl: 3 .  verapamil (CALAN-SR) 240 MG CR tablet, Take 1 tablet (240 mg total) by mouth at bedtime., Disp: , Rfl:  .  Vitamin D, Ergocalciferol, (DRISDOL) 1.25 MG (50000 UNIT) CAPS capsule, Take 50,000 Units by mouth every 7 (seven) days. Monday, Disp: , Rfl:       Objective:   Vitals:   03/11/20 0859  BP: 112/60  Pulse: 79  Temp: (!) 97.4 F (36.3 C)  TempSrc: Temporal  SpO2: 97%  Weight: 153 lb 3.2 oz (69.5 kg)  Height: _0  (1.6 m)    Estimated body mass index is 27.14 kg/m as calculated from the following:   Height as of this encounter: _1  (1.6 m).   Weight as of this encounter: 153 lb 3.2 oz (69.5 kg).  _2 @  Filed Weights   03/11/20 0859  Weight: 153 lb 3.2 oz (69.5 kg)     Physical Exam General: No distress. Looks well Neuro: Alert and Oriented x 3. GCS 15. Speech normal. No oral thrish Psych: Pleasant Resp:  Barrel Chest - no.  Wheeze - no, Crackles - no, No overt respiratory distress CVS: Normal heart sounds. Murmurs - no Ext: Stigmata of Connective Tissue Disease -  no HEENT: Normal upper airway. PEERL +. No post nasal drip        Assessment:       ICD-10-CM   1. Moderate persistent asthma without complication  B56.70 SARS-COV-2 IgG  2. History of rheumatoid arthritis  Z87.39 SARS-COV-2 IgG  3. Immunosuppression due to drug therapy ([place])  D84.821 SARS-COV-2 IgG   Z79.899        Plan:     Patient Instructions  Moderate persistent asthma without complication  - well controlled  clinically with dulera - no oral thrush  Plan - continue dulera 200/4.5 , 2 puff twice daily - albuterol is only as needed -due to good response hold off blood IgE, rAST allergy panel and eoisnophil check  At this moment  Vaccine counseling Immunosuppression due to drug therapy [facility])  - glad you had booster shot mid nov 2021  Plan  - check covid IgG blood level 03/11/2020= -  if negative can consider Astra Zeneca MAB or enrollment in 2022 in [facility] MAB trial  Followup  -will call with results  - return in 6 months or sooner if needed     SIGNATURE    Dr. Brand Males, M.D., F.C.C.P,  Pulmonary and Critical Care Medicine Staff Physician, [place] Director - Interstitial Lung Disease  Program  Pulmonary [place] [facility] [place], [place], [address]  Pager: [phone], If no answer or between  15:00h - 7:00h: call [phone] Telephone: [phone]  9:22 AM 03/11/2020

## 2020-03-11 NOTE — Progress Notes (Signed)
Let Brandy Townsend know that she has antibody response to the covid booster. Level is 10.69  but I do nnot know how protective that is or what that means. Overall this is good news

## 2020-03-16 ENCOUNTER — Other Ambulatory Visit: Payer: Medicare Other | Admitting: *Deleted

## 2020-03-16 ENCOUNTER — Other Ambulatory Visit: Payer: Self-pay

## 2020-03-16 DIAGNOSIS — E782 Mixed hyperlipidemia: Secondary | ICD-10-CM | POA: Diagnosis not present

## 2020-03-16 LAB — LIPID PANEL
Chol/HDL Ratio: 1.7 ratio (ref 0.0–4.4)
Cholesterol, Total: 145 mg/dL (ref 100–199)
HDL: 84 mg/dL (ref >39)
LDL Chol Calc (NIH): 48 mg/dL (ref 0–99)
Triglycerides: 66 mg/dL (ref 0–149)
VLDL Cholesterol Cal: 13 mg/dL (ref 5–40)

## 2020-03-16 LAB — ALT: ALT: 22 IU/L (ref 0–32)

## 2020-03-26 NOTE — Telephone Encounter (Signed)
Right now patient's vital signs are O2 99%, HR 94, and  BP 137/61. Patient stated yesterday when her BP dropped that they had increased her infusion dose and that she was feeling tired. Patient stated she was given some chips and fluid and her BP came up. Since patient's BP is good right now, encouraged patient to check it daily and if it drops low, then have a small salty snack and drink some fluids to get it to come up. Patient feels like this was an isolated event. Encouraged patient to call our office if her BP starts running low again. Will send message to Norma Fredrickson NP for further advisement.

## 2020-04-06 DIAGNOSIS — E890 Postprocedural hypothyroidism: Secondary | ICD-10-CM | POA: Diagnosis not present

## 2020-04-06 DIAGNOSIS — E785 Hyperlipidemia, unspecified: Secondary | ICD-10-CM | POA: Diagnosis not present

## 2020-04-06 DIAGNOSIS — N200 Calculus of kidney: Secondary | ICD-10-CM | POA: Diagnosis not present

## 2020-04-06 DIAGNOSIS — M060A Rheumatoid arthritis without rheumatoid factor, other specified site: Secondary | ICD-10-CM | POA: Diagnosis not present

## 2020-04-06 DIAGNOSIS — I2510 Atherosclerotic heart disease of native coronary artery without angina pectoris: Secondary | ICD-10-CM | POA: Diagnosis not present

## 2020-04-06 DIAGNOSIS — I10 Essential (primary) hypertension: Secondary | ICD-10-CM | POA: Diagnosis not present

## 2020-04-06 DIAGNOSIS — M810 Age-related osteoporosis without current pathological fracture: Secondary | ICD-10-CM | POA: Diagnosis not present

## 2020-04-06 DIAGNOSIS — J4540 Moderate persistent asthma, uncomplicated: Secondary | ICD-10-CM | POA: Diagnosis not present

## 2020-04-06 DIAGNOSIS — E042 Nontoxic multinodular goiter: Secondary | ICD-10-CM | POA: Diagnosis not present

## 2020-04-06 DIAGNOSIS — K5730 Diverticulosis of large intestine without perforation or abscess without bleeding: Secondary | ICD-10-CM | POA: Diagnosis not present

## 2020-04-06 DIAGNOSIS — I700 Atherosclerosis of aorta: Secondary | ICD-10-CM | POA: Diagnosis not present

## 2020-04-08 DIAGNOSIS — H7291 Unspecified perforation of tympanic membrane, right ear: Secondary | ICD-10-CM | POA: Diagnosis not present

## 2020-05-11 ENCOUNTER — Other Ambulatory Visit: Payer: Self-pay | Admitting: Nurse Practitioner

## 2020-05-14 ENCOUNTER — Other Ambulatory Visit: Payer: Self-pay

## 2020-05-14 ENCOUNTER — Encounter (HOSPITAL_COMMUNITY): Payer: Self-pay

## 2020-05-14 ENCOUNTER — Emergency Department (HOSPITAL_COMMUNITY)
Admission: EM | Admit: 2020-05-14 | Discharge: 2020-05-14 | Disposition: A | Discharge status: Home / Self Care | Payer: Medicare Other | Attending: Emergency Medicine | Admitting: Emergency Medicine

## 2020-05-14 DIAGNOSIS — Z7951 Long term (current) use of inhaled steroids: Secondary | ICD-10-CM | POA: Insufficient documentation

## 2020-05-14 DIAGNOSIS — E876 Hypokalemia: Secondary | ICD-10-CM | POA: Diagnosis not present

## 2020-05-14 DIAGNOSIS — I2510 Atherosclerotic heart disease of native coronary artery without angina pectoris: Secondary | ICD-10-CM | POA: Diagnosis not present

## 2020-05-14 DIAGNOSIS — Z7982 Long term (current) use of aspirin: Secondary | ICD-10-CM | POA: Insufficient documentation

## 2020-05-14 DIAGNOSIS — I10 Essential (primary) hypertension: Secondary | ICD-10-CM | POA: Diagnosis not present

## 2020-05-14 DIAGNOSIS — Z87891 Personal history of nicotine dependence: Secondary | ICD-10-CM | POA: Insufficient documentation

## 2020-05-14 DIAGNOSIS — E860 Dehydration: Secondary | ICD-10-CM | POA: Insufficient documentation

## 2020-05-14 DIAGNOSIS — Z79899 Other long term (current) drug therapy: Secondary | ICD-10-CM | POA: Insufficient documentation

## 2020-05-14 DIAGNOSIS — J45909 Unspecified asthma, uncomplicated: Secondary | ICD-10-CM | POA: Diagnosis not present

## 2020-05-14 DIAGNOSIS — R42 Dizziness and giddiness: Secondary | ICD-10-CM

## 2020-05-14 DIAGNOSIS — R531 Weakness: Secondary | ICD-10-CM | POA: Diagnosis present

## 2020-05-14 LAB — URINALYSIS, ROUTINE W REFLEX MICROSCOPIC
Bilirubin Urine: NEGATIVE
Glucose, UA: NEGATIVE mg/dL
Hgb urine dipstick: NEGATIVE
Ketones, ur: NEGATIVE mg/dL
Nitrite: NEGATIVE
Protein, ur: NEGATIVE mg/dL
Specific Gravity, Urine: 1.013 (ref 1.005–1.030)
pH: 5 (ref 5.0–8.0)

## 2020-05-14 LAB — CBC WITH DIFFERENTIAL/PLATELET
Abs Immature Granulocytes: 0.03 10*3/uL (ref 0.00–0.07)
Basophils Absolute: 0 10*3/uL (ref 0.0–0.1)
Basophils Relative: 1 %
Eosinophils Absolute: 0.5 10*3/uL (ref 0.0–0.5)
Eosinophils Relative: 5 %
HCT: 42.9 % (ref 36.0–46.0)
Hemoglobin: 13.9 g/dL (ref 12.0–15.0)
Immature Granulocytes: 0 %
Lymphocytes Relative: 28 %
Lymphs Abs: 2.4 10*3/uL (ref 0.7–4.0)
MCH: 30.2 pg (ref 26.0–34.0)
MCHC: 32.4 g/dL (ref 30.0–36.0)
MCV: 93.3 fL (ref 80.0–100.0)
Monocytes Absolute: 1.2 10*3/uL — ABNORMAL HIGH (ref 0.1–1.0)
Monocytes Relative: 14 %
Neutro Abs: 4.5 10*3/uL (ref 1.7–7.7)
Neutrophils Relative %: 52 %
Platelets: 323 10*3/uL (ref 150–400)
RBC: 4.6 MIL/uL (ref 3.87–5.11)
RDW: 13.2 % (ref 11.5–15.5)
WBC: 8.5 10*3/uL (ref 4.0–10.5)
nRBC: 0 % (ref 0.0–0.2)

## 2020-05-14 LAB — COMPREHENSIVE METABOLIC PANEL
ALT: 25 U/L (ref 0–44)
AST: 18 U/L (ref 15–41)
Albumin: 4.4 g/dL (ref 3.5–5.0)
Alkaline Phosphatase: 81 U/L (ref 38–126)
Anion gap: 11 (ref 5–15)
BUN: 13 mg/dL (ref 8–23)
CO2: 31 mmol/L (ref 22–32)
Calcium: 9.4 mg/dL (ref 8.9–10.3)
Chloride: 97 mmol/L — ABNORMAL LOW (ref 98–111)
Creatinine, Ser: 0.85 mg/dL (ref 0.44–1.00)
GFR, Estimated: >60 mL/min (ref >60)
Glucose, Bld: 98 mg/dL (ref 70–99)
Potassium: 3.1 mmol/L — ABNORMAL LOW (ref 3.5–5.1)
Sodium: 139 mmol/L (ref 135–145)
Total Bilirubin: 0.6 mg/dL (ref 0.3–1.2)
Total Protein: 7.5 g/dL (ref 6.5–8.1)

## 2020-05-14 LAB — LIPASE, BLOOD: Lipase: 26 U/L (ref 11–51)

## 2020-05-14 MED ORDER — PROMETHAZINE HCL 25 MG PO TABS: 25.0000 mg | ORAL_TABLET | Freq: Four times a day (QID) | ORAL | 0 refills | Status: DC | PRN

## 2020-05-14 MED ORDER — MECLIZINE HCL 25 MG PO TABS: 25.0000 mg | ORAL_TABLET | Freq: Three times a day (TID) | ORAL | 0 refills | Status: DC | PRN

## 2020-05-14 MED ORDER — MECLIZINE HCL 25 MG PO TABS
25.0000 mg | ORAL_TABLET | Freq: Once | ORAL | Status: AC
  Administered 2020-05-14: 25 mg via ORAL
  Filled 2020-05-14: qty 1

## 2020-05-14 MED ORDER — SODIUM CHLORIDE 0.9 % IV BOLUS
1000.0000 mL | Freq: Once | INTRAVENOUS | Status: AC
  Administered 2020-05-14: 1000 mL via INTRAVENOUS

## 2020-05-14 MED ORDER — ONDANSETRON HCL 4 MG/2ML IJ SOLN
4.0000 mg | Freq: Once | INTRAMUSCULAR | Status: AC
  Administered 2020-05-14: 4 mg via INTRAVENOUS
  Filled 2020-05-14: qty 2

## 2020-05-14 MED ORDER — POTASSIUM CHLORIDE CRYS ER 20 MEQ PO TBCR
40.0000 meq | EXTENDED_RELEASE_TABLET | Freq: Once | ORAL | Status: AC
  Administered 2020-05-14: 40 meq via ORAL
  Filled 2020-05-14: qty 2

## 2020-05-14 NOTE — Discharge Instructions (Signed)
You have your wax in the right ear that is causing your dizziness.  Please call and see your ENT doctor tomorrow  Take meclizine as needed  Stay hydrated.  You can take Phenergan as needed for nausea  Follow-up with your primary care doctor  Return to ER if you have worse dizziness, abdominal pain, vomiting, fever, trouble urinating.

## 2020-05-14 NOTE — ED Triage Notes (Signed)
Pt presents with c/o weakness. Pt reports that she began to vomit on Sunday. Pt reports she feels very weak and her MD sent her here for possible IV fluids.

## 2020-05-14 NOTE — ED Notes (Signed)
Pt drinking gingerale, denies n/v.

## 2020-05-14 NOTE — ED Provider Notes (Signed)
[facility] DEPT Provider Note   CSN: [id number] Arrival date & time: 05/14/20  1316     History Chief Complaint  Patient presents with  . Weakness    Brandy Townsend is a 70 y.o. female hx of CAD, HTN, Rheumatoid arthritis, vertigo, here presenting with dehydration and vertigo.  Patient states that she ate some medium rare beef hamburgers on Saturday.  She states on Sunday she started vomiting.  She is vomited about 9 times.  Multiple family members ate the same thing and another family member was sick as well.  Patient denies any abdominal pain or diarrhea.  Patient was able to keep food down yesterday.  She was concerned that maybe it was from the hamburger.  Patient was sent here by her doctor for IV fluids.  Patient denies any Covid exposure and her husband tested negative for Covid last week.  The history is provided by the patient.       Past Medical History:  Diagnosis Date  . Asthma   . CAD (coronary artery disease)   . Graves disease    s/p RAI  . Hypertension   . Osteoporosis   . Rheumatoid arthritis ([place])   . Thyroid disease   . Vertigo     Patient Active Problem List   Diagnosis Date Noted  . Anxiety 01/08/2020  . Chest pain 01/08/2020  . Pulmonary infiltrate present on computed tomography   . Right kidney stone 07/18/2019  . Nephrolithiasis 07/18/2019  . Rheumatoid arthritis ([place]) 01/14/2017  . Pain in both hands 05/12/2016  . Chronic left shoulder pain 05/12/2016  . Chronic sinusitis of both maxillary sinuses 03/24/2016  . 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 Nonspecific chest pain 05/16/2015  . HTN (hypertension) 05/16/2015  . Hyperthyroidism 05/16/2015  . Prerenal azotemia 05/16/2015    Past Surgical History:  Procedure Laterality Date  . BRONCHIAL WASHINGS  09/12/2019   Procedure: BRONCHIAL WASHINGS;  Surgeon: Brand Males, MD;  Location: [facility];  Service: Endoscopy;;  .  INTRAVASCULAR PRESSURE WIRE/FFR STUDY N/A 01/08/2020   Procedure: INTRAVASCULAR PRESSURE WIRE/FFR STUDY;  Surgeon: Martinique, Peter M, MD;  Location: [facility];  Service: Cardiovascular;  Laterality: N/A;  . LEFT HEART CATH AND CORONARY ANGIOGRAPHY N/A 01/08/2020   Procedure: LEFT HEART CATH AND CORONARY ANGIOGRAPHY;  Surgeon: Martinique, Peter M, MD;  Location: [facility];  Service: Cardiovascular;  Laterality: N/A;  . VIDEO BRONCHOSCOPY N/A 09/12/2019   Procedure: VIDEO BRONCHOSCOPY WITHOUT FLUORO;  Surgeon: Brand Males, MD;  [facility]: [facility];  Service: Endoscopy;  Laterality: N/A;     OB History   No obstetric history on file.     History reviewed. No pertinent family history.  Social History   Tobacco Use  . Smoking status: Former Smoker    Packs/day: 1.00    Years: 20.00    Pack years: 20.00    Types: Cigarettes    Quit date: 03/28/2009    Years since quitting: 11.1  . Smokeless tobacco: Never Used  . Tobacco comment: social smoker  Vaping Use  . Vaping Use: Never used  Substance Use Topics  . Alcohol use: No  . Drug use: No    Home Medications Prior to Admission medications   Medication Sig Start Date End Date Taking? Authorizing Provider  acetaminophen (TYLENOL) 500 MG tablet Take 500 mg by mouth every 6 (six) hours as needed for moderate pain.    [provider]  albuterol (VENTOLIN HFA) 108 (90 Base) MCG/ACT inhaler Inhale 1-2 puffs into the lungs every 6 (six) hours as needed for wheezing or shortness of breath. 12/10/19   Parrett, Fonnie Mu, NP  alendronate (FOSAMAX) 70 MG tablet Take 70 mg by mouth once a week. 12/26/19   [provider]  aspirin EC 81 MG tablet Take 1 tablet (81 mg total) by mouth daily. Swallow whole. 12/17/19   Burtis Junes, NP  cetirizine (ZYRTEC) 10 MG tablet Take 10 mg by mouth at bedtime as needed for allergies.     [provider]  folic acid (FOLVITE) 1 MG tablet Take 3 mg by mouth daily.  11/13/19   [provider]  ibuprofen (ADVIL) 200 MG tablet Take 200 mg by mouth every 6 (six) hours as needed for mild pain (inflammation pain).     [provider]  inFLIXimab-abda (RENFLEXIS IV) Inject 1 ampule into the vein once a week. Every two weeks, then once every six weeks    [provider]  KLOR-CON 10 10 MEQ tablet Take 10 mEq by mouth daily. 08/11/14   [provider]  levothyroxine (SYNTHROID) 50 MCG tablet Take 50 mcg by mouth daily.  08/16/18   [provider]  methotrexate (RHEUMATREX) 2.5 MG tablet Take 15 mg by mouth once a week. Monday evening    [provider]  mometasone-formoterol (DULERA) 200-5 MCG/ACT AERO Inhale 2 puffs into the lungs in the morning and at bedtime. 01/23/20   Brand Males, MD  ondansetron (ZOFRAN ODT) 4 MG disintegrating tablet Take 1 tablet (4 mg total) by mouth every 8 (eight) hours as needed for nausea or vomiting. 01/01/20   Horton, Barbette Hair, MD  pyridOXINE (VITAMIN B-6) 100 MG tablet Take 100 mg by mouth daily.    [provider]  rosuvastatin (CRESTOR) 10 MG tablet Take 1 tablet (10 mg total) by mouth daily. 01/20/20 04/19/20  Burtis Junes, NP  torsemide (DEMADEX) 20 MG tablet Take 1 tablet (20 mg total) by mouth daily. 05/15/19   Burtis Junes, NP  verapamil (CALAN-SR) 240 MG CR tablet TAKE 1 TABLET BY MOUTH EVERY DAY 05/11/20   Burtis Junes, NP  Vitamin D, Ergocalciferol, (DRISDOL) 1.25 MG (50000 UNIT) CAPS capsule Take 50,000 Units by mouth every 7 (seven) days. Monday    [provider]    Allergies    Macrobid [nitrofurantoin monohyd macro]  Review of Systems   Review of Systems  Neurological: Positive for weakness.  All other systems reviewed and are negative.   Physical Exam Updated Vital Signs BP (!) 99/51 (BP Location: Left Arm)   Pulse 66   Temp 98.3 F (36.8 C) (Oral)   Resp 18   SpO2 100%   Physical Exam Vitals and nursing note reviewed.   Constitutional:      Appearance: Normal appearance.  HENT:     Head: Normocephalic.     Right Ear: Tympanic membrane normal.     Left Ear: Tympanic membrane normal.     Ears:     Comments: Cerumen in both ears, worse on right side     Nose: Nose normal.     Mouth/Throat:     Comments: MM slightly dry  Eyes:     Extraocular Movements: Extraocular movements intact.     Pupils: Pupils are equal, round, and reactive to light.  Cardiovascular:     Rate and Rhythm: Normal rate and regular rhythm.     Pulses: Normal pulses.  Heart sounds: Normal heart sounds.  Pulmonary:     Effort: Pulmonary effort is normal.     Breath sounds: Normal breath sounds.  Abdominal:     General: Abdomen is flat.     Palpations: Abdomen is soft.  Musculoskeletal:        General: Normal range of motion.     Cervical back: Normal range of motion and neck supple.  Skin:    General: Skin is warm.  Neurological:     General: No focal deficit present.     Mental Status: She is alert and oriented to person, place, and time.  Psychiatric:        Mood and Affect: Mood normal.        Behavior: Behavior normal.     ED Results / Procedures / Treatments   Labs (all labs ordered are listed, but only abnormal results are displayed) Labs Reviewed  CBC WITH DIFFERENTIAL/PLATELET - Abnormal; Notable for the following components:      Result Value   Monocytes Absolute 1.2 (*)    All other components within normal limits  COMPREHENSIVE METABOLIC PANEL - Abnormal; Notable for the following components:   Potassium 3.1 (*)    Chloride 97 (*)    All other components within normal limits  URINALYSIS, ROUTINE W REFLEX MICROSCOPIC - Abnormal; Notable for the following components:   APPearance HAZY (*)    Leukocytes,Ua LARGE (*)    Bacteria, UA RARE (*)    Non Squamous Epithelial 0-5 (*)    All other components within normal limits  URINE CULTURE  LIPASE, BLOOD    EKG EKG  Interpretation  Date/Time:  Thursday May 14 2020 13:47:48 EST Ventricular Rate:  71 PR Interval:    QRS Duration: 79 QT Interval:  379 QTC Calculation: 412 R Axis:   132 Text Interpretation: Right and left arm electrode reversal, interpretation assumes no reversal Sinus or ectopic atrial rhythm Probable lateral infarct, age indeterminate No significant change since last tracing Confirmed by Wandra Arthurs [phone]) on 05/14/2020 2:08:14 PM   Radiology No results found.  Procedures Procedures   Medications Ordered in ED Medications  sodium chloride 0.9 % bolus 1,000 mL (0 mLs Intravenous Stopped 05/14/20 1550)  ondansetron (ZOFRAN) injection 4 mg (4 mg Intravenous Given 05/14/20 1403)  meclizine (ANTIVERT) tablet 25 mg (25 mg Oral Given 05/14/20 1403)  potassium chloride SA (KLOR-CON) CR tablet 40 mEq (40 mEq Oral Given 05/14/20 1550)    ED Course  I have reviewed the triage vital signs and the nursing notes.  Pertinent labs & imaging results that were available during my care of the patient were reviewed by me and considered in my medical decision making (see chart for details).    MDM Rules/Calculators/A&P                         Brandy Townsend is a 70 y.o. female here with vomiting.  Patient likely has viral gastroenteritis.  It may be precipitated from eating medium rare hamburger.  She has no fever abdominal pain or diarrhea right now.  Patient appears mildly dehydrated.  Patient has no known Covid exposure.  Will check CBC and CMP and lipase and give Zofran and IV fluids and reassess  3:52 PM Labs showed K 3.1, supplemented.  UA showed some bacteria and leuks.  However patient does not have any urinary symptoms so likely contamination from gastroenteritis.  Patient tolerated p.o. in the ED.  Patient also felt better after meclizine.  Will discharge patient with Phenergan and meclizine.  Patient has ENT follow-up tomorrow for the earwax on the right ear.  Stable for  discharge  Final Clinical Impression(s) / ED Diagnoses Final diagnoses:  None    Rx / DC Orders ED Discharge Orders    None       Drenda Freeze, MD 05/14/20 1555

## 2020-05-15 DIAGNOSIS — H7291 Unspecified perforation of tympanic membrane, right ear: Secondary | ICD-10-CM | POA: Diagnosis not present

## 2020-05-16 LAB — URINE CULTURE

## 2020-05-18 NOTE — Telephone Encounter (Signed)
MR- please advise on pt email and if okay with Breo need to know which strength, thanks!  Good Morning! I have checked and the only inhaler is the Adair Patter is what my insurance covers. My insurance agent said if Dr. [name] wants me on the [facility], He can  call Medicare and ask for a formalary exception for it . I can not ask , it has to come from my doctor.  If he wants me to try Memory Dance that is fine, please call it into [facility] [address].   Thank you so much! Marcie Bal

## 2020-05-20 ENCOUNTER — Telehealth: Payer: Self-pay | Admitting: Internal Medicine

## 2020-05-20 MED ORDER — BREO ELLIPTA 100-25 MCG/INH IN AEPB: 1.0000 | INHALATION_SPRAY | Freq: Every day | RESPIRATORY_TRACT | 0 refills | Status: DC

## 2020-05-20 NOTE — Progress Notes (Signed)
CARDIOLOGY OFFICE NOTE  Date:  05/27/2020    Brandy Townsend Date of Birth: 20-Dec-1950 Medical Record #[id number]  PCP:  Reynold Bowen, MD  Cardiologist: Johnsie Cancel    History of Present Illness: 70 y.o.  history of palpitations, atypical chest pain with prior normal Myoview, asthma, RA, hx thyroid ablation, remote tobacco use, and +vaccines for Covid.   Recurrent chest pain  Cardiac CT 01/06/20 calcium score 790 97 th percentile CAD RADS 3 possibly obstructive RCA/OM1/D1 FFR CT positive RCA/D1 Cath 01/08/20 Martin[place] Ramus 70% OM` 60% RCA 55% Medical Rx Did not tolerate imdur with headache    Quit smoking 2012 gained a lot of weight Sees [name] for asthma On Dulera and albuterol as needed   Seen in ED 05/14/20 with weakness and vertigo and vomiting after eating some rare hamburger Hydrated and d/c with meclizine and Phenergan   Her husband is recovering from esophageal cancer and still with feeding tube Has two grand children in [place] that see her over summer and two here   BP drops with RA infusion  Discussed cutting verapamil to 120 mg daily    Past Medical History:  Diagnosis Date  . Asthma   . CAD (coronary artery disease)   . Graves disease    s/p RAI  . Hypertension   . Osteoporosis   . Rheumatoid arthritis ([place])   . Thyroid disease   . Vertigo     Past Surgical History:  Procedure Laterality Date  . BRONCHIAL WASHINGS  09/12/2019   Procedure: BRONCHIAL WASHINGS;  Surgeon: Brand Males, MD;  [facility];  Service: Endoscopy;;  . INTRAVASCULAR PRESSURE WIRE/FFR STUDY N/A 01/08/2020   Procedure: INTRAVASCULAR PRESSURE WIRE/FFR STUDY;  Surgeon: Martinique, Kc Sedlak M, MD;  Location: [facility];  Service: Cardiovascular;  Laterality: N/A;  . LEFT HEART CATH AND CORONARY ANGIOGRAPHY N/A 01/08/2020   Procedure: LEFT HEART CATH AND CORONARY ANGIOGRAPHY;  Surgeon: Martinique, Lochlann Mastrangelo M, MD;  Location: [facility];  Service: Cardiovascular;   Laterality: N/A;  . VIDEO BRONCHOSCOPY N/A 09/12/2019   Procedure: VIDEO BRONCHOSCOPY WITHOUT FLUORO;  Surgeon: Brand Males, MD;  [facility];  Service: Endoscopy;  Laterality: N/A;     Medications: Current Meds  Medication Sig  . acetaminophen (TYLENOL) 500 MG tablet Take 500 mg by mouth every 6 (six) hours as needed for moderate pain.  Marland Kitchen albuterol (VENTOLIN HFA) 108 (90 Base) MCG/ACT inhaler Inhale 1-2 puffs into the lungs every 6 (six) hours as needed for wheezing or shortness of breath.  Marland Kitchen alendronate (FOSAMAX) 70 MG tablet Take 70 mg by mouth once a week.  Marland Kitchen aspirin EC 81 MG tablet Take 1 tablet (81 mg total) by mouth daily. Swallow whole.  . cetirizine (ZYRTEC) 10 MG tablet Take 10 mg by mouth at bedtime as needed for allergies.   . fluticasone furoate-vilanterol (BREO ELLIPTA) 100-25 MCG/INH AEPB Inhale 1 puff into the lungs daily.  . folic acid (FOLVITE) 1 MG tablet Take 3 mg by mouth daily.  Marland Kitchen ibuprofen (ADVIL) 200 MG tablet Take 200 mg by mouth every 6 (six) hours as needed for mild pain (inflammation pain).   . inFLIXimab-abda (RENFLEXIS IV) Inject 1 ampule into the vein once a week. Every two weeks, then once every six weeks  . KLOR-CON 10 10 MEQ tablet Take 10 mEq by mouth daily.  Marland Kitchen levothyroxine (SYNTHROID) 50 MCG tablet Take 50 mcg by mouth daily.   . meclizine (ANTIVERT) 25 MG tablet Take  1 tablet (25 mg total) by mouth 3 (three) times daily as needed for dizziness.  . methotrexate (RHEUMATREX) 2.5 MG tablet Take 15 mg by mouth once a week. Monday evening  . mometasone-formoterol (DULERA) 200-5 MCG/ACT AERO Inhale 2 puffs into the lungs in the morning and at bedtime.  . ondansetron (ZOFRAN ODT) 4 MG disintegrating tablet Take 1 tablet (4 mg total) by mouth every 8 (eight) hours as needed for nausea or vomiting.  . promethazine (PHENERGAN) 25 MG tablet Take 1 tablet (25 mg total) by mouth every 6 (six) hours as needed for nausea or vomiting.  . pyridOXINE  (VITAMIN B-6) 100 MG tablet Take 100 mg by mouth daily.  Marland Kitchen torsemide (DEMADEX) 20 MG tablet Take 1 tablet (20 mg total) by mouth daily.  . verapamil (CALAN-SR) 240 MG CR tablet TAKE 1 TABLET BY MOUTH EVERY DAY  . Vitamin D, Ergocalciferol, (DRISDOL) 1.25 MG (50000 UNIT) CAPS capsule Take 50,000 Units by mouth every 7 (seven) days. Monday     Allergies: Allergies  Allergen Reactions  . Macrobid [employer] Macro] Other (See Comments)    Lowered potassium, caused aches and pains    Social History: The patient  reports that she quit smoking about 11 years ago. Her smoking use included cigarettes. She has a 20.00 pack-year smoking history. She has never used smokeless tobacco. She reports that she does not drink alcohol and does not use drugs.   Family History: The patient's family history is not on file. FH + for CAD.   Review of Systems: Please see the history of present illness.   All other systems are reviewed and negative.   Physical Exam: VS:  BP 108/60   Pulse 67   Ht 5\' 3"  (1.6 m)   Wt 68.9 kg   SpO2 99%   BMI 26.93 kg/m  .  BMI Body mass index is 26.93 kg/m.  Wt Readings from Last 3 Encounters:  05/27/20 68.9 kg  03/11/20 69.5 kg  01/23/20 67.6 kg   Affect appropriate Healthy:  appears stated age 58: normal Neck supple with no adenopathy JVP normal no bruits no thyromegaly Lungs clear with no wheezing and good diaphragmatic motion Heart:  S1/S2 no murmur, no rub, gallop or click PMI normal Abdomen: benighn, BS positve, no tenderness, no AAA no bruit.  No HSM or HJR Distal pulses intact with no bruits No edema Neuro non-focal Skin warm and dry No muscular weakness .   LABORATORY DATA:  EKG:   2/17 SR ? Limb lead reversal normal ST segments   Lab Results  Component Value Date   WBC 8.5 05/14/2020   HGB 13.9 05/14/2020   HCT 42.9 05/14/2020   PLT 323 05/14/2020   GLUCOSE 98 05/14/2020   CHOL 145 03/16/2020   TRIG 66 03/16/2020    HDL 84 03/16/2020   LDLCALC 48 03/16/2020   ALT 25 05/14/2020   AST 18 05/14/2020   NA 139 05/14/2020   K 3.1 (L) 05/14/2020   CL 97 (L) 05/14/2020   CREATININE 0.85 05/14/2020   BUN 13 05/14/2020   CO2 31 05/14/2020   TSH 1.735 05/08/2019     BNP (last 3 results) No results for input(s): BNP in the last 8760 hours.  ProBNP (last 3 results) No results for input(s): PROBNP in the last 8760 hours.   Other Studies Reviewed Today:  CARDIAC CATHETERIZATION  Result Date: 01/08/2020  Mid LAD lesion is 20% stenosed.  1st Diag lesion is 40% stenosed.  Ramus lesion is 70% stenosed.  1st Mrg lesion is 60% stenosed.  Prox RCA to Mid RCA lesion is 55% stenosed.  The left ventricular systolic function is normal.  LV end diastolic pressure is normal.  The left ventricular ejection fraction is 55-65% by visual estimate.  1. Nonobstructive CAD. There is modest disease in the mid RCA with normal DFR. Modest disease in the first OM. The ramus branch is tiny. 2. Normal LV function 3. Normal LVEDP. Plan: recommend continued medical therapy. Patient is a candidate for DC today.   CORONARY CT IMPRESSION 12/2019: 1. Calcium score 790 which is 54 th percentile for age and sex and involves all 3 major epicardial vessels  2.  Normal aortic root 2.9 cm  3. CAD RADS 3 possible obstructive CAD tightest lesions appear to be in mid/distal RCA and OM1 Study sent for [facility] CT  Jenkins Rouge   Electronically Signed   By: Eligha Bridegroom  MyoviewStudy Highlights2/2021   The left ventricular ejection fraction is hyperdynamic (>65%).  Nuclear stress EF: 77%.  Horizontal ST segment depression ST segment depression of 1 mm was noted during stress in the II, III and aVF leads, and returning to baseline after 5-9 minutes of recovery.  The study is normal.  This is a low risk study.  No change from prior study.    ECHOIMPRESSIONS2/2021  1. Left ventricular ejection fraction, by  estimation, is 55 to 60%. The  left ventricle has normal function. The left ventricle has no regional  wall motion abnormalities. Left ventricular diastolic parameters were  normal.  2. Right ventricular systolic function is normal. The right ventricular  size is normal. There is normal pulmonary artery systolic pressure. The  estimated right ventricular systolic pressure is 87.5 mmHg.  3. The mitral valve is grossly normal. No evidence of mitral valve  regurgitation.  4. The aortic valve is tricuspid. Aortic valve regurgitation is not  visualized. No aortic stenosis is present.  5. The inferior vena cava is normal in size with greater than 50%  respiratory variability, suggesting right atrial pressure of 3 mmHg.    ASSESSMENT &PLAN:    1. CAD - Cath 01/08/20  following abnormal CT and in the setting of having chest pain/SOB - to manage medically. She is on statin Did not tolerate nitrates  Fatigue and significant asthma not on beta blocker continue ASA and verapamil   2. HTN - seems to be running lower decrease verapamil to 120 mg daily .   3. HLD - on statin tolerating 10 mg Crestor LDL 48   4. RA - followed by Rheumatology on methotrexate   5. Pulmonary : f/u [name] asthma, COPD Gold 2    6. Situational stress - not on Rx f/u primary  7. Thyroid:  On synthroid TSH normal      Signed: Jenkins Rouge, MD  05/27/2020 8:08 AM  [facility] [address] [place], [place]  [address] Phone: [phone] Fax: [phone]

## 2020-05-20 NOTE — Telephone Encounter (Signed)
Call made to patient, confirmed DOB. Made aware of MR recommendations. Voiced understanding. Samples of Breo given. Patient to call in 2 weeks to give [place] update.   Nothing further needed at this time.

## 2020-05-20 NOTE — Telephone Encounter (Signed)
Let us do  Breo lower dose - 1 puff daily and get her feedback in few weeks befor doing prior auth.      Current Outpatient Medications:  .  acetaminophen (TYLENOL) 500 MG tablet, Take 500 mg by mouth every 6 (six) hours as needed for moderate pain., Disp: , Rfl:  .  albuterol (VENTOLIN HFA) 108 (90 Base) MCG/ACT inhaler, Inhale 1-2 puffs into the lungs every 6 (six) hours as needed for wheezing or shortness of breath., Disp: 8 g, Rfl: 2 .  alendronate (FOSAMAX) 70 MG tablet, Take 70 mg by mouth once a week., Disp: , Rfl:  .  aspirin EC 81 MG tablet, Take 1 tablet (81 mg total) by mouth daily. Swallow whole., Disp: 90 tablet, Rfl: 3 .  cetirizine (ZYRTEC) 10 MG tablet, Take 10 mg by mouth at bedtime as needed for allergies. , Disp: , Rfl:  .  folic acid (FOLVITE) 1 MG tablet, Take 3 mg by mouth daily., Disp: , Rfl:  .  ibuprofen (ADVIL) 200 MG tablet, Take 200 mg by mouth every 6 (six) hours as needed for mild pain (inflammation pain). , Disp: , Rfl:  .  inFLIXimab-abda (RENFLEXIS IV), Inject 1 ampule into the vein once a week. Every two weeks, then once every six weeks, Disp: , Rfl:  .  KLOR-CON 10 10 MEQ tablet, Take 10 mEq by mouth daily., Disp: , Rfl: 0 .  levothyroxine (SYNTHROID) 50 MCG tablet, Take 50 mcg by mouth daily. , Disp: , Rfl:  .  meclizine (ANTIVERT) 25 MG tablet, Take 1 tablet (25 mg total) by mouth 3 (three) times daily as needed for dizziness., Disp: 10 tablet, Rfl: 0 .  methotrexate (RHEUMATREX) 2.5 MG tablet, Take 15 mg by mouth once a week. Monday evening, Disp: , Rfl:  .  mometasone-formoterol (DULERA) 200-5 MCG/ACT AERO, Inhale 2 puffs into the lungs in the morning and at bedtime., Disp: 13 g, Rfl: 5 .  ondansetron (ZOFRAN ODT) 4 MG disintegrating tablet, Take 1 tablet (4 mg total) by mouth every 8 (eight) hours as needed for nausea or vomiting., Disp: 20 tablet, Rfl: 0 .  promethazine (PHENERGAN) 25 MG tablet, Take 1 tablet (25 mg total) by mouth every 6 (six) hours as  needed for nausea or vomiting., Disp: 10 tablet, Rfl: 0 .  pyridOXINE (VITAMIN B-6) 100 MG tablet, Take 100 mg by mouth daily., Disp: , Rfl:  .  rosuvastatin (CRESTOR) 10 MG tablet, Take 1 tablet (10 mg total) by mouth daily., Disp: 90 tablet, Rfl: 3 .  torsemide (DEMADEX) 20 MG tablet, Take 1 tablet (20 mg total) by mouth daily., Disp: 90 tablet, Rfl: 3 .  verapamil (CALAN-SR) 240 MG CR tablet, TAKE 1 TABLET BY MOUTH EVERY DAY, Disp: 90 tablet, Rfl: 3 .  Vitamin D, Ergocalciferol, (DRISDOL) 1.25 MG (50000 UNIT) CAPS capsule, Take 50,000 Units by mouth every 7 (seven) days. Monday, Disp: , Rfl:

## 2020-05-27 ENCOUNTER — Other Ambulatory Visit: Payer: Self-pay

## 2020-05-27 ENCOUNTER — Ambulatory Visit (INDEPENDENT_AMBULATORY_CARE_PROVIDER_SITE_OTHER): Payer: Medicare Other | Admitting: Cardiovascular Disease

## 2020-05-27 ENCOUNTER — Encounter: Payer: Self-pay | Admitting: Cardiovascular Disease

## 2020-05-27 VITALS — BP 108/60 | HR 67 | Ht 63.0 in | Wt 152.0 lb

## 2020-05-27 DIAGNOSIS — I2510 Atherosclerotic heart disease of native coronary artery without angina pectoris: Secondary | ICD-10-CM

## 2020-05-27 MED ORDER — NITROGLYCERIN 0.4 MG SL SUBL: 0.4000 mg | SUBLINGUAL_TABLET | SUBLINGUAL | 3 refills | Status: DC | PRN

## 2020-05-27 NOTE — Patient Instructions (Addendum)
Medication Instructions:  Your physician has recommended you make the following change in your medication:   Take 1 Nitroglycerin (NTG), under your tongue, while sitting. If no relief of pain may repeat NTG, one tab every 5 minutes up to 3 tablets total over 15 minutes. If no relief CALL 911. If you have dizziness/lightheadness while taking NTG, stop taking and call 911.  *If you need a refill on your cardiac medications before your next appointment, please call your pharmacy*  Lab Work: If you have labs (blood work) drawn today and your tests are completely normal, you will receive your results only by: Marland Kitchen MyChart Message (if you have MyChart) OR . A paper copy in the mail If you have any lab test that is abnormal or we need to change your treatment, we will call you to review the results.  Testing/Procedures: None ordered today.  Follow-Up: At [facility], you and your health needs are our priority.  As part of our continuing mission to provide you with exceptional heart care, we have created designated Provider Care Teams.  These Care Teams include your primary Cardiologist (physician) and Advanced Practice Providers (APPs -  Physician Assistants and Nurse Practitioners) who all work together to provide you with the care you need, when you need it.  We recommend signing up for the patient portal called "MyChart".  Sign up information is provided on this After Visit Summary.  MyChart is used to connect with patients for Virtual Visits (Telemedicine).  Patients are able to view lab/test results, encounter notes, upcoming appointments, etc.  Non-urgent messages can be sent to your provider as well.   To learn more about what you can do with MyChart, go to [email or web address].    Your next appointment:   6 month(s)  The format for your next appointment:   In Person  Provider:   You may see Jenkins Rouge, MD or one of the following Advanced Practice Providers on your designated  Care Team:    Kathyrn Drown, NP

## 2020-06-02 DIAGNOSIS — M0579 Rheumatoid arthritis with rheumatoid factor of multiple sites without organ or systems involvement: Secondary | ICD-10-CM | POA: Diagnosis not present

## 2020-06-05 ENCOUNTER — Other Ambulatory Visit: Payer: Self-pay | Admitting: Adult Health

## 2020-06-05 ENCOUNTER — Ambulatory Visit
Admission: RE | Admit: 2020-06-05 | Discharge: 2020-06-05 | Disposition: A | Discharge status: Home / Self Care | Payer: Medicare Other | Source: Ambulatory Visit | Attending: Adult Health | Admitting: Adult Health

## 2020-06-05 ENCOUNTER — Other Ambulatory Visit: Payer: Self-pay

## 2020-06-05 DIAGNOSIS — K5790 Diverticulosis of intestine, part unspecified, without perforation or abscess without bleeding: Secondary | ICD-10-CM

## 2020-06-05 DIAGNOSIS — I700 Atherosclerosis of aorta: Secondary | ICD-10-CM | POA: Diagnosis not present

## 2020-06-05 DIAGNOSIS — R1032 Left lower quadrant pain: Secondary | ICD-10-CM

## 2020-06-05 DIAGNOSIS — I868 Varicose veins of other specified sites: Secondary | ICD-10-CM | POA: Diagnosis not present

## 2020-06-05 DIAGNOSIS — Z87442 Personal history of urinary calculi: Secondary | ICD-10-CM | POA: Diagnosis not present

## 2020-06-05 DIAGNOSIS — R300 Dysuria: Secondary | ICD-10-CM | POA: Diagnosis not present

## 2020-06-05 DIAGNOSIS — K5730 Diverticulosis of large intestine without perforation or abscess without bleeding: Secondary | ICD-10-CM | POA: Diagnosis not present

## 2020-06-05 DIAGNOSIS — E785 Hyperlipidemia, unspecified: Secondary | ICD-10-CM | POA: Diagnosis not present

## 2020-06-05 MED ORDER — IOPAMIDOL (ISOVUE-300) INJECTION 61%
100.0000 mL | Freq: Once | INTRAVENOUS | Status: AC | PRN
  Administered 2020-06-05: 100 mL via INTRAVENOUS

## 2020-06-08 DIAGNOSIS — R1032 Left lower quadrant pain: Secondary | ICD-10-CM | POA: Diagnosis not present

## 2020-06-09 ENCOUNTER — Other Ambulatory Visit (HOSPITAL_COMMUNITY): Payer: Self-pay | Admitting: Obstetrics & Gynecology

## 2020-06-09 ENCOUNTER — Other Ambulatory Visit: Payer: Self-pay | Admitting: Obstetrics & Gynecology

## 2020-06-09 DIAGNOSIS — R1032 Left lower quadrant pain: Secondary | ICD-10-CM

## 2020-06-09 DIAGNOSIS — R1084 Generalized abdominal pain: Secondary | ICD-10-CM | POA: Diagnosis not present

## 2020-06-10 ENCOUNTER — Telehealth: Payer: Self-pay | Admitting: Internal Medicine

## 2020-06-10 NOTE — Telephone Encounter (Signed)
Called and spoke with pt letting her know that we would attempt a PA and she verbalized understanding.  PA attempted for tier exception as the [employer]an is a tier 3:  PA Key: [id number]  Per CMM: contact plan in 5 days for follow up. I have also printed PA info from [facility]. Routing to myself to follow up on.

## 2020-06-10 NOTE — Telephone Encounter (Signed)
Called and spoke with pt who states that the Memory Dance is not working as well as the [employer] did while she was on it. Pt said that while using the Breo she has noticed that she still has some problems with her asthma and has had to use her rescue inhaler at least twice overnight.  Pt wants to know if there is something else that could be recommended in place of the [facility].  Pt also said that she believes that Memory Dance was the only inhaler that was covered by insurance and I stated to her if we needed to, we could do a PA for a different inhaler to try to get it approved. Also stated to her if we have samples of the new inhaler that provider wants to provide, we should be able to give her some and she verbalized understanding.  Due to MR being out of the office, sending encounter to Dr. Shearon Stalls who is provider of the day.  Dr. Shearon Stalls, please advise on this for pt with an alternate inhaler other than Dulera 200 or Breo that we might be able to put pt on to help with her asthma.  Looking through recent inhalers pt has tried other than Breo 100 or Dulera, pt has tried Symbicort 80 and 160, Atrovent HFA 17mcg, Asmanex 265mcg.

## 2020-06-10 NOTE — Telephone Encounter (Signed)
Would initiate PA for dulera.

## 2020-06-11 MED ORDER — MOMETASONE FURO-FORMOTEROL FUM 200-5 MCG/ACT IN AERO: 2.0000 | INHALATION_SPRAY | Freq: Two times a day (BID) | RESPIRATORY_TRACT | 5 refills | Status: DC

## 2020-06-11 NOTE — Telephone Encounter (Signed)
Called and spoke with pt who stated that she received a phone call from insurance stating that the PA had been approved for her [facility]. Verified preferred pharmacy and sent Rx in for pt.nothing further needed.

## 2020-06-11 NOTE — Telephone Encounter (Signed)
Pt returning a phone call to state that she received a call that her pa was approved and that a prescription needed to be called in. Pt can be reached at [phone].

## 2020-06-12 ENCOUNTER — Ambulatory Visit (HOSPITAL_COMMUNITY)
Admission: RE | Admit: 2020-06-12 | Discharge: 2020-06-12 | Disposition: A | Discharge status: Home / Self Care | Payer: Medicare Other | Source: Ambulatory Visit | Attending: Obstetrics & Gynecology | Admitting: Obstetrics & Gynecology

## 2020-06-12 ENCOUNTER — Telehealth: Payer: Self-pay | Admitting: Internal Medicine

## 2020-06-12 ENCOUNTER — Other Ambulatory Visit: Payer: Self-pay

## 2020-06-12 DIAGNOSIS — R1032 Left lower quadrant pain: Secondary | ICD-10-CM | POA: Insufficient documentation

## 2020-06-12 DIAGNOSIS — R102 Pelvic and perineal pain: Secondary | ICD-10-CM | POA: Diagnosis not present

## 2020-06-12 NOTE — Telephone Encounter (Signed)
I have called and LM on VM for the pt letting her know that the dulera is covered by her insurance but it will still be $176 for her.

## 2020-06-12 NOTE — Telephone Encounter (Signed)
I called the pharmacy and they stated that they did speak with the pt and explained this to her.  The dulera is covered by her insurance but her deductible for this medication is $176.  Will call pt after 1 today to discuss.

## 2020-06-15 NOTE — Telephone Encounter (Signed)
Pt returning a phone call. Pt can be reached at [phone]

## 2020-06-15 NOTE — Telephone Encounter (Signed)
Left message for patient to call back  

## 2020-06-16 DIAGNOSIS — I10 Essential (primary) hypertension: Secondary | ICD-10-CM | POA: Diagnosis not present

## 2020-06-16 DIAGNOSIS — N390 Urinary tract infection, site not specified: Secondary | ICD-10-CM | POA: Diagnosis not present

## 2020-06-16 MED ORDER — MOMETASONE FURO-FORMOTEROL FUM 200-5 MCG/ACT IN AERO: 2.0000 | INHALATION_SPRAY | Freq: Two times a day (BID) | RESPIRATORY_TRACT | 11 refills | Status: DC

## 2020-06-16 NOTE — Telephone Encounter (Signed)
Called and spoke with patient advised that she has already attempted a PA and was told that there would not be a tier reduction and the price would be $176 for the [facility].  She needs to apply for financial assistance through the manufacture. Advised I will get the paperwork together and put it in an envelope at the front desk for her.   She will come get it tomorrow 06/18/20.  Advised she will need proof of income and what she has paid out of pocked for the past year at her pharmacy and the pharmacist must sign it.  She verbalized understanding.  Forms placed at front desk for patient to complete and then return.

## 2020-06-16 NOTE — Telephone Encounter (Signed)
Will forward to triage to call pt after 1pm today.

## 2020-06-18 ENCOUNTER — Other Ambulatory Visit: Payer: Medicare Other

## 2020-06-19 ENCOUNTER — Telehealth: Payer: Self-pay | Admitting: *Deleted

## 2020-06-19 ENCOUNTER — Encounter: Payer: Self-pay | Admitting: Gynecologic Oncology

## 2020-06-19 DIAGNOSIS — H7291 Unspecified perforation of tympanic membrane, right ear: Secondary | ICD-10-CM | POA: Diagnosis not present

## 2020-06-19 DIAGNOSIS — H9113 Presbycusis, bilateral: Secondary | ICD-10-CM | POA: Diagnosis not present

## 2020-06-19 DIAGNOSIS — H90A31 Mixed conductive and sensorineural hearing loss, unilateral, right ear with restricted hearing on the contralateral side: Secondary | ICD-10-CM | POA: Diagnosis not present

## 2020-06-19 DIAGNOSIS — J302 Other seasonal allergic rhinitis: Secondary | ICD-10-CM | POA: Diagnosis not present

## 2020-06-19 DIAGNOSIS — H90A22 Sensorineural hearing loss, unilateral, left ear, with restricted hearing on the contralateral side: Secondary | ICD-10-CM | POA: Diagnosis not present

## 2020-06-19 DIAGNOSIS — H9311 Tinnitus, right ear: Secondary | ICD-10-CM | POA: Insufficient documentation

## 2020-06-19 NOTE — Telephone Encounter (Signed)
Called the patient and scheduled a new patient appt for 3/28 at 2:45 pm with an arrival time of 2:15 pm.

## 2020-06-22 ENCOUNTER — Other Ambulatory Visit: Payer: Self-pay

## 2020-06-22 ENCOUNTER — Inpatient Hospital Stay: Payer: Medicare Other | Attending: Gynecologic Oncology | Admitting: Gynecologic Oncology

## 2020-06-22 ENCOUNTER — Encounter: Payer: Self-pay | Admitting: Gynecologic Oncology

## 2020-06-22 VITALS — HR 66 | Temp 98.4°F | Resp 20 | Ht 63.0 in | Wt 149.6 lb

## 2020-06-22 DIAGNOSIS — I10 Essential (primary) hypertension: Secondary | ICD-10-CM | POA: Insufficient documentation

## 2020-06-22 DIAGNOSIS — R102 Pelvic and perineal pain: Secondary | ICD-10-CM

## 2020-06-22 DIAGNOSIS — Z79899 Other long term (current) drug therapy: Secondary | ICD-10-CM | POA: Diagnosis not present

## 2020-06-22 DIAGNOSIS — I2510 Atherosclerotic heart disease of native coronary artery without angina pectoris: Secondary | ICD-10-CM | POA: Insufficient documentation

## 2020-06-22 DIAGNOSIS — M069 Rheumatoid arthritis, unspecified: Secondary | ICD-10-CM | POA: Diagnosis not present

## 2020-06-22 DIAGNOSIS — E7800 Pure hypercholesterolemia, unspecified: Secondary | ICD-10-CM | POA: Diagnosis not present

## 2020-06-22 DIAGNOSIS — N9489 Other specified conditions associated with female genital organs and menstrual cycle: Secondary | ICD-10-CM

## 2020-06-22 DIAGNOSIS — Z780 Asymptomatic menopausal state: Secondary | ICD-10-CM | POA: Diagnosis not present

## 2020-06-22 DIAGNOSIS — R1032 Left lower quadrant pain: Secondary | ICD-10-CM | POA: Insufficient documentation

## 2020-06-22 NOTE — Progress Notes (Signed)
GYNECOLOGIC ONCOLOGY NEW PATIENT CONSULTATION   Patient Name: [place]  Patient Age: 70 y.o. Date of Service: 06/22/20 Referring Provider: Dr. Stann Mainland  Primary Care Provider: Reynold Bowen, MD Consulting Provider: Jeral Pinch, MD   Assessment/Plan:  Postmenopausal patient with recent new pelvic pain.  Marcie Bal and I reviewed in depth the history over the last month and a half in terms of her symptoms as well as her recent CT and ultrasound.  She is findings on both imaging studies of dilated pelvic veins, raising the concern for diagnosis of pelvic congestion syndrome, while this is more common in premenopausal patients, there are case reports of this occurring in menopausal women.  Treatment can be effective for pelvic congestion syndrome includes trial of progesterone (thought to help because it causes contraction of the veins) and embolization.  Embolization can be quite effective and has been described in postmenopausal women.  While pelvic congestion syndrome is thought to often happen related to changes in hormones, the etiology in menopause is not well understood.  I am not completely convinced that her symptoms are related to imaging findings.  She had a number of GI symptoms going on in February and stool changes that seem to have only resolved within the last day or 2.  I am somewhat suspicious that her pain may be related to GI causes.  Whether she has had improvement of her symptoms because of the progesterone or whether this was unrelated but occurred at the same time that the progesterone was started, I think it is reasonable for her to continue on the progesterone for the next week.  If she continues to have improvement or resolution of her symptoms, then my recommendation was to have her stop the progesterone.  If her symptoms recur, then I think this points more towards a diagnosis of pelvic congestion syndrome.  If her symptoms do not recur off of the progesterone, then I think  it is more likely related to a GI process.  With regard to management.  If she continues to have symptoms, she could either continue on the progesterone, or consideration for embolization would be very reasonable.  Given her comorbidities, I would not undertake offended if surgery (hysterectomy) as the neck step in management.  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   I have asked the patient to keep me posted on her symptoms over the next couple of weeks.  A copy of this note was sent to the patient's referring provider.   65 minutes of total time was spent for this patient encounter, including preparation, face-to-face counseling with the patient and coordination of care, and documentation of the encounter.   Jeral Pinch, MD  Division of Gynecologic Oncology  Department of Obstetrics and Gynecology  [facility]  ___________________________________________  Chief Complaint: No chief complaint on file.   History of Present Illness:  [place] is a 70 y.o. y.o. female who is seen in consultation at the request of Dr. Stann Mainland for an evaluation of pelvic pain in a postmenopausal woman.  Patient reports developing food poisoning in mid February.  After being sick for a number of days, her primary care provider sent her to the hospital for IV fluids.  She also received potassium for hypokalemia.  The next week, when still not feeling better, she called her primary care provider again.  She was treated for urinary tract infection with 5 days of Cipro and felt better.  She denied any urinary  symptoms at the time including dysuria, hematuria, or frequency.  She then went for her regularly scheduled rheumatoid arthritis infusion in the next day developed left-sided pelvic pain that woke her up.  She described this pain as intermittent in nature, sometimes sharp and sometimes a dull ache.  She rated it at an 8 out of 10 at its worst and described as almost as  severe as when she had kidney stones.  She denied any radiation of the pain and did not have any associated symptoms.  Given the severity of her pain, CT scan was obtained to rule out kidney stone or diverticulitis.  CT scan did not reveal any acute abdominal or pelvic process but noted prominent pelvic veins especially on the left.  Given these findings the patient was referred to her gynecologist, Dr. Stann Mainland.  She had another urinalysis to rule out a urinary tract infection and had a pelvic ultrasound, which again confirmed dilated pelvic veins as well as a small, sub-1 cm fibroid.  She was started on medroxyprogesterone, 30 mg at night.  Over the last week or so, she has had some improvement of her pain which she now rates at a 2 of 10.  Yesterday she describes the pain more as discomfort.  During this time, she had a change in the caliber of her stool which she describes as being pencil thin.  Today is the first normal bowel movement in terms of size and caliber that she has had.  In general, she notes her appetite had improved.  She lost about 6-1/2 pounds during the weeks when she was having significant pain but she has gained most of this back.  She denies any nausea or emesis other than nausea related to pain medications.  She is still having the lasting effects of some constipation after taking Dilaudid for several days.  She denies any urinary symptoms.  Patient lives in [place] with her husband.  He just recently finished treatment for esophageal cancer.  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  Her family history is significant only for a daughter with stage III breast cancer diagnosed 12 years ago who had noted negative genetic testing at the time.  PAST MEDICAL HISTORY:  Past Medical History:  Diagnosis Date  . Asthma   . CAD (coronary artery disease)   . Graves disease    s/p RAI  . High cholesterol   . Hypertension   .  Osteoporosis   . Rheumatoid arthritis ([place])   . Thyroid disease   . Vertigo      PAST SURGICAL HISTORY:  Past Surgical History:  Procedure Laterality Date  . BRONCHIAL WASHINGS  09/12/2019   Procedure: BRONCHIAL WASHINGS;  Surgeon: Brand Males, MD;  Location: [facility];  Service: Endoscopy;;  . INTRAVASCULAR PRESSURE WIRE/FFR STUDY N/A 01/08/2020   Procedure: INTRAVASCULAR PRESSURE WIRE/FFR STUDY;  Surgeon: Martinique, Peter M, MD;  Location: [facility];  Service: Cardiovascular;  Laterality: N/A;  . LEFT HEART CATH AND CORONARY ANGIOGRAPHY N/A 01/08/2020   Procedure: LEFT HEART CATH AND CORONARY ANGIOGRAPHY;  Surgeon: Martinique, Peter M, MD;  Location: [facility];  Service: Cardiovascular;  Laterality: N/A;  . VIDEO BRONCHOSCOPY N/A 09/12/2019   Procedure: VIDEO BRONCHOSCOPY WITHOUT FLUORO;  Surgeon: Brand Males, MD;  Location: [facility];  Service: Endoscopy;  Laterality: N/A;    OB/GYN HISTORY:  OB History  Gravida Para Term Preterm AB Living  3 2          SAB IAB Ectopic Multiple Live Births               # Outcome Date GA Lbr Len/2nd Weight Sex Delivery Anes PTL Lv  3 Gravida           2 Para           1 Para             No LMP recorded. Patient is postmenopausal.  Age at menarche: 85 Age at menopause: 50 Hx of HRT: Denies Hx of STDs: Denies Last pap: 2021 History of abnormal pap smears: No  SCREENING STUDIES:  Last mammogram: 2021  Last colonoscopy: 2012  MEDICATIONS: Outpatient Encounter Medications as of 06/22/2020  Medication Sig  . acetaminophen (TYLENOL) 500 MG tablet Take 500 mg by mouth every 6 (six) hours as needed for moderate pain.  Marland Kitchen albuterol (VENTOLIN HFA) 108 (90 Base) MCG/ACT inhaler Inhale 1-2 puffs into the lungs every 6 (six) hours as needed for wheezing or shortness of breath.  Marland Kitchen aspirin EC 81 MG tablet Take 1 tablet (81 mg total) by mouth daily. Swallow whole.  . cetirizine (ZYRTEC) 10 MG tablet Take 10 mg by mouth at  bedtime as needed for allergies.   . folic acid (FOLVITE) 1 MG tablet Take 1 mg by mouth daily. Only takes on days she takes methotrexate  . ibuprofen (ADVIL) 200 MG tablet Take 200 mg by mouth every 6 (six) hours as needed for mild pain (inflammation pain).   . inFLIXimab-abda (RENFLEXIS IV) Inject 1 ampule into the vein once a week. Every two weeks, then once every six weeks  . KLOR-CON 10 10 MEQ tablet Take 10 mEq by mouth daily.  Marland Kitchen levothyroxine (SYNTHROID) 50 MCG tablet Take 50 mcg by mouth daily.   . meclizine (ANTIVERT) 25 MG tablet Take 1 tablet (25 mg total) by mouth 3 (three) times daily as needed for dizziness.  . methotrexate (RHEUMATREX) 2.5 MG tablet Take 15 mg by mouth once a week. Monday evening  . mometasone-formoterol (DULERA) 200-5 MCG/ACT AERO Inhale 2 puffs into the lungs in the morning and at bedtime.  . mometasone-formoterol (DULERA) 200-5 MCG/ACT AERO Inhale 2 puffs into the lungs in the morning and at bedtime.  . ondansetron (ZOFRAN ODT) 4 MG disintegrating tablet Take 1 tablet (4 mg total) by mouth every 8 (eight) hours as needed for nausea or vomiting.  . promethazine (PHENERGAN) 25 MG tablet Take 1 tablet (25 mg total) by mouth every 6 (six) hours as needed for nausea or vomiting.  . pyridOXINE (VITAMIN B-6) 100 MG tablet Take 100 mg by mouth daily.  Marland Kitchen torsemide (DEMADEX) 20 MG tablet Take 1 tablet (20 mg total) by mouth daily.  . verapamil (CALAN-SR) 240 MG CR tablet TAKE 1 TABLET BY MOUTH EVERY DAY  . Vitamin D, Ergocalciferol, (DRISDOL) 1.25 MG (50000 UNIT) CAPS capsule Take 50,000 Units by mouth every 7 (seven) days. Monday  . medroxyPROGESTERone (PROVERA) 10 MG tablet Take 30 mg by mouth at bedtime.  . nitroGLYCERIN (NITROSTAT) 0.4 MG SL tablet Place 1 tablet (0.4 mg total) under the tongue every 5 (five) minutes as needed for chest pain. (Patient not taking: Reported on 06/19/2020)  . rosuvastatin (CRESTOR) 10 MG tablet Take 1 tablet (10 mg total) by mouth daily.   . [DISCONTINUED] alendronate (FOSAMAX) 70 MG tablet Take 70 mg by mouth once a week.   No facility-administered encounter medications on  file as of 06/22/2020.    ALLERGIES:  Allergies  Allergen Reactions  . Cat Hair Extract     Other reaction(s): congestion  . Dust Mite Extract     Other reaction(s): Unknown  . Macrobid [employer] Macro] Other (See Comments)    Lowered potassium, caused aches and pains     FAMILY HISTORY:  Family History  Problem Relation Age of Onset  . Heart disease Father   . Cancer Daughter 83       stage 3 breast      SOCIAL HISTORY:  Social Connections: Not on file    REVIEW OF SYSTEMS:  Denies appetite changes, fevers, chills, fatigue, unexplained weight changes. Denies hearing loss, neck lumps or masses, mouth sores, ringing in ears or voice changes. Denies cough or wheezing.  Denies shortness of breath. Denies chest pain or palpitations. Denies leg swelling. Denies abdominal distention, pain, blood in stools, constipation, diarrhea, nausea, vomiting, or early satiety. Denies pain with intercourse, dysuria, frequency, hematuria or incontinence. Denies hot flashes, pelvic pain, vaginal bleeding or vaginal discharge.   Denies joint pain, back pain or muscle pain/cramps. Denies itching, rash, or wounds. Denies dizziness, headaches, numbness or seizures. Denies swollen lymph nodes or glands, denies easy bruising or bleeding. Denies anxiety, depression, confusion, or decreased concentration.  Physical Exam:  Vital Signs for this encounter:  Pulse 66, temperature 98.4 F (36.9 C), temperature source Tympanic, resp. rate 20, height 5\' 3"  (1.6 m), weight 149 lb 9.6 oz (67.9 kg), SpO2 100 %. Body mass index is 26.5 kg/m. General: Alert, oriented, no acute distress.  HEENT: Normocephalic, atraumatic. Sclera anicteric.  Chest: Unlabored breathing on room air.  LABORATORY AND RADIOLOGIC DATA:  Outside medical records were reviewed to  synthesize the above history, along with the history and physical obtained during the visit.   Lab Results  Component Value Date   WBC 8.5 05/14/2020   HGB 13.9 05/14/2020   HCT 42.9 05/14/2020   PLT 323 05/14/2020   GLUCOSE 98 05/14/2020   CHOL 145 03/16/2020   TRIG 66 03/16/2020   HDL 84 03/16/2020   LDLCALC 48 03/16/2020   ALT 25 05/14/2020   AST 18 05/14/2020   NA 139 05/14/2020   K 3.1 (L) 05/14/2020   CL 97 (L) 05/14/2020   CREATININE 0.85 05/14/2020   BUN 13 05/14/2020   CO2 31 05/14/2020   TSH 1.735 05/08/2019   Pelvic ultrasound 06/12/2020: IMPRESSION: 6 mm probable intramural fibroid in the left lower uterine segment.  Prominent parametrial and left gonadal vessels, as noted on prior CT. This is nonspecific but (on occasion) can be a cause of pelvic pain in a patient with pelvic congestion syndrome.   CT abdomen pelvis on 06/05/2020: 1. No acute abdominopelvic abnormality. 2. Rectosigmoid diverticulosis without acute inflammation. 3. Prominent pelvic veins, especially on the left. The left ovarian vein is dilated measuring up to approximately 8 mm. Findings are nonspecific but can be seen in patients with pelvic congestion syndrome.

## 2020-06-22 NOTE — Patient Instructions (Signed)
It was a pleasure meeting you!  Please keep me posted over the next several weeks in terms of how you are doing.  As long as your bowel function continues to be normal after today, my recommendation would be to continue the progesterone for no more than a week.  I would then recommend stopping it.  If you do not have pelvic pain or symptoms again, I think we can attribute your symptoms to GI issues.  If you were to have symptoms again, you could restart the progesterone but I would recommend consideration of embolization.  While it is uncommon, there are case reports of postmenopausal women developing pelvic congestion syndrome.  Again, given the timeframe with which your pain started as well as the other symptoms, I think this is unlikely and that we happened to find some dilated blood vessels simply because we were looking.

## 2020-06-23 DIAGNOSIS — F418 Other specified anxiety disorders: Secondary | ICD-10-CM | POA: Diagnosis not present

## 2020-06-23 DIAGNOSIS — N200 Calculus of kidney: Secondary | ICD-10-CM | POA: Diagnosis not present

## 2020-06-23 DIAGNOSIS — J302 Other seasonal allergic rhinitis: Secondary | ICD-10-CM | POA: Diagnosis not present

## 2020-06-23 DIAGNOSIS — M0579 Rheumatoid arthritis with rheumatoid factor of multiple sites without organ or systems involvement: Secondary | ICD-10-CM | POA: Diagnosis not present

## 2020-06-23 DIAGNOSIS — Z6825 Body mass index (BMI) 25.0-25.9, adult: Secondary | ICD-10-CM | POA: Diagnosis not present

## 2020-06-23 DIAGNOSIS — Z79899 Other long term (current) drug therapy: Secondary | ICD-10-CM | POA: Diagnosis not present

## 2020-06-23 DIAGNOSIS — R9389 Abnormal findings on diagnostic imaging of other specified body structures: Secondary | ICD-10-CM | POA: Diagnosis not present

## 2020-06-23 DIAGNOSIS — N9489 Other specified conditions associated with female genital organs and menstrual cycle: Secondary | ICD-10-CM | POA: Diagnosis not present

## 2020-06-23 DIAGNOSIS — M810 Age-related osteoporosis without current pathological fracture: Secondary | ICD-10-CM | POA: Diagnosis not present

## 2020-06-23 DIAGNOSIS — E663 Overweight: Secondary | ICD-10-CM | POA: Diagnosis not present

## 2020-06-24 ENCOUNTER — Telehealth: Payer: Self-pay | Admitting: Internal Medicine

## 2020-06-24 MED ORDER — BREO ELLIPTA 100-25 MCG/INH IN AEPB: 1.0000 | INHALATION_SPRAY | Freq: Every day | RESPIRATORY_TRACT | 0 refills | Status: DC

## 2020-06-24 NOTE — Telephone Encounter (Signed)
Okay to use Breo till she gets the [place].  Please also give her samples of Breo for at least 4 weeks.  She lives nearby and can pick it up

## 2020-06-24 NOTE — Telephone Encounter (Signed)
Error

## 2020-06-24 NOTE — Telephone Encounter (Signed)
Pt calling because Breo doesn't work as well as Dentist- pt still filling out pt assistance forms to get [facility]. Pt said that MR said he'd find something else in the meantime.Pt states if he wants to do more Breo-send to [facility] [address]. If something else, Please call pt. [phone]

## 2020-06-24 NOTE — Telephone Encounter (Signed)
Spoke with pt who feels like [facility]he Mannan is more effective then [facility] for her. Pt is curently filling out paperwork for [facility] pt assistance. Pt states she can not afford Dulera until she gets pt assistance . Pt is wanting to know if we could refill Breo to use until pt can get [facility]. Dr. [name] please advise.

## 2020-06-24 NOTE — Telephone Encounter (Signed)
ATC patient to let her know of Dr. Golden Pop recs with inhalers. Per DPR left detailed messages. Samples have been placed up front for patient. Nothing further needed at this time.

## 2020-06-26 ENCOUNTER — Telehealth: Payer: Self-pay | Admitting: Internal Medicine

## 2020-06-26 NOTE — Telephone Encounter (Signed)
Paperwork has been obtained and all prescriber spots have been signed by MR as well. Called and spoke with pt letting her know that we did receive her paperwork and would mail it off to [employer] for her. Pt verbalized understanding. Nothing further needed.

## 2020-07-09 DIAGNOSIS — Z1231 Encounter for screening mammogram for malignant neoplasm of breast: Secondary | ICD-10-CM | POA: Diagnosis not present

## 2020-07-09 DIAGNOSIS — Z6824 Body mass index (BMI) 24.0-24.9, adult: Secondary | ICD-10-CM | POA: Diagnosis not present

## 2020-07-09 DIAGNOSIS — Z01419 Encounter for gynecological examination (general) (routine) without abnormal findings: Secondary | ICD-10-CM | POA: Diagnosis not present

## 2020-07-10 ENCOUNTER — Emergency Department (HOSPITAL_BASED_OUTPATIENT_CLINIC_OR_DEPARTMENT_OTHER): Payer: Medicare Other

## 2020-07-10 ENCOUNTER — Encounter (HOSPITAL_BASED_OUTPATIENT_CLINIC_OR_DEPARTMENT_OTHER): Payer: Self-pay | Admitting: Emergency Medicine

## 2020-07-10 ENCOUNTER — Other Ambulatory Visit: Payer: Self-pay

## 2020-07-10 ENCOUNTER — Emergency Department (HOSPITAL_BASED_OUTPATIENT_CLINIC_OR_DEPARTMENT_OTHER)
Admission: EM | Admit: 2020-07-10 | Discharge: 2020-07-10 | Disposition: A | Discharge status: Home / Self Care | Payer: Medicare Other | Attending: Emergency Medicine | Admitting: Emergency Medicine

## 2020-07-10 DIAGNOSIS — R059 Cough, unspecified: Secondary | ICD-10-CM | POA: Diagnosis not present

## 2020-07-10 DIAGNOSIS — J4540 Moderate persistent asthma, uncomplicated: Secondary | ICD-10-CM | POA: Diagnosis not present

## 2020-07-10 DIAGNOSIS — Z79899 Other long term (current) drug therapy: Secondary | ICD-10-CM | POA: Diagnosis not present

## 2020-07-10 DIAGNOSIS — I10 Essential (primary) hypertension: Secondary | ICD-10-CM | POA: Insufficient documentation

## 2020-07-10 DIAGNOSIS — Z87891 Personal history of nicotine dependence: Secondary | ICD-10-CM | POA: Insufficient documentation

## 2020-07-10 DIAGNOSIS — U071 COVID-19: Secondary | ICD-10-CM | POA: Diagnosis not present

## 2020-07-10 DIAGNOSIS — Z7951 Long term (current) use of inhaled steroids: Secondary | ICD-10-CM | POA: Insufficient documentation

## 2020-07-10 DIAGNOSIS — I2510 Atherosclerotic heart disease of native coronary artery without angina pectoris: Secondary | ICD-10-CM | POA: Insufficient documentation

## 2020-07-10 DIAGNOSIS — R0602 Shortness of breath: Secondary | ICD-10-CM | POA: Diagnosis not present

## 2020-07-10 DIAGNOSIS — Z7982 Long term (current) use of aspirin: Secondary | ICD-10-CM | POA: Insufficient documentation

## 2020-07-10 LAB — RESP PANEL BY RT-PCR (FLU A&B, COVID) ARPGX2
Influenza A by PCR: NEGATIVE
Influenza B by PCR: NEGATIVE
SARS Coronavirus 2 by RT PCR: POSITIVE — AB

## 2020-07-10 MED ORDER — SODIUM CHLORIDE 0.9 % IV SOLN
100.0000 mg | INTRAVENOUS | Status: AC
  Administered 2020-07-10 (×2): 100 mg via INTRAVENOUS

## 2020-07-10 NOTE — ED Triage Notes (Signed)
Patient reports feeling short of breath this am. She states not feeling very well yesterday, chills. Lethargic. Pts husband tested positive for COVID yesterday. Pt has hx RA and is on immunotherapy.

## 2020-07-10 NOTE — ED Provider Notes (Signed)
[facility] EMERGENCY DEPT Provider Note   CSN: [id number] Arrival date & time: 07/10/20  0840     History Chief Complaint  Patient presents with  . Shortness of Breath    Brandy Townsend is a 70 y.o. female.  The history is provided by the patient and medical records. No language interpreter was used.  Shortness of Breath Severity:  Mild Onset quality:  Gradual Duration:  1 day Timing:  Constant Progression:  Unchanged Chronicity:  New Context: URI   Relieved by:  Nothing Worsened by:  Nothing Ineffective treatments:  None tried Associated symptoms: cough and wheezing   Associated symptoms: no abdominal pain, no chest pain, no diaphoresis, no fever, no headaches, no neck pain, no rash, no sputum production and no vomiting   Risk factors: no hx of PE/DVT        Past Medical History:  Diagnosis Date  . Asthma   . CAD (coronary artery disease)   . Graves disease    s/p RAI  . High cholesterol   . Hypertension   . Osteoporosis   . Rheumatoid arthritis ([place])   . Thyroid disease   . Vertigo     Patient Active Problem List   Diagnosis Date Noted  . Left lower quadrant abdominal pain 06/22/2020  . Anxiety 01/08/2020  . Chest pain 01/08/2020  . Pulmonary infiltrate present on computed tomography   . Right kidney stone 07/18/2019  . Nephrolithiasis 07/18/2019  . Rheumatoid arthritis ([place]) 01/14/2017  . Pain in both hands 05/12/2016  . Chronic left shoulder pain 05/12/2016  . Chronic sinusitis of both maxillary sinuses 03/24/2016  . 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 Nonspecific chest pain 05/16/2015  . HTN (hypertension) 05/16/2015  . Hyperthyroidism 05/16/2015  . Prerenal azotemia 05/16/2015    Past Surgical History:  Procedure Laterality Date  . BRONCHIAL WASHINGS  09/12/2019   Procedure: BRONCHIAL WASHINGS;  Surgeon: Brand Males, MD;  Location: [facility];  Service: Endoscopy;;  . INTRAVASCULAR PRESSURE  WIRE/FFR STUDY N/A 01/08/2020   Procedure: INTRAVASCULAR PRESSURE WIRE/FFR STUDY;  Surgeon: Martinique, Peter M, MD;  Location: [facility];  Service: Cardiovascular;  Laterality: N/A;  . LEFT HEART CATH AND CORONARY ANGIOGRAPHY N/A 01/08/2020   Procedure: LEFT HEART CATH AND CORONARY ANGIOGRAPHY;  Surgeon: Martinique, Peter M, MD;  Location: [facility];  Service: Cardiovascular;  Laterality: N/A;  . VIDEO BRONCHOSCOPY N/A 09/12/2019   Procedure: VIDEO BRONCHOSCOPY WITHOUT FLUORO;  Surgeon: Brand Males, MD;  Location: [facility];  Service: Endoscopy;  Laterality: N/A;     OB History    Gravida  3   Para  2   Term      Preterm      AB      Living        SAB      IAB      Ectopic      Multiple      Live Births              Family History  Problem Relation Age of Onset  . Heart disease Father   . Cancer Daughter 27       stage 3 breast     Social History   Tobacco Use  . Smoking status: Former Smoker    Packs/day: 1.00    Years: 20.00    Pack years: 20.00    Types: Cigarettes    Quit date: 08/26/2008  Years since quitting: 11.8  . Smokeless tobacco: Never Used  . Tobacco comment: social smoker  Vaping Use  . Vaping Use: Never used  Substance Use Topics  . Alcohol use: No  . Drug use: No    Home Medications Prior to Admission medications   Medication Sig Start Date End Date Taking? Authorizing Provider  acetaminophen (TYLENOL) 500 MG tablet Take 500 mg by mouth every 6 (six) hours as needed for moderate pain.    [provider]  albuterol (VENTOLIN HFA) 108 (90 Base) MCG/ACT inhaler Inhale 1-2 puffs into the lungs every 6 (six) hours as needed for wheezing or shortness of breath. 12/10/19   Parrett, Fonnie Mu, NP  aspirin EC 81 MG tablet Take 1 tablet (81 mg total) by mouth daily. Swallow whole. 12/17/19   Burtis Junes, NP  cetirizine (ZYRTEC) 10 MG tablet Take 10 mg by mouth at bedtime as needed for allergies.     [provider]  fluticasone furoate-vilanterol (BREO ELLIPTA) 100-25 MCG/INH AEPB Inhale 1 puff into the lungs daily. 06/24/20   Brand Males, MD  folic acid (FOLVITE) 1 MG tablet Take 1 mg by mouth daily. Only takes on days she takes methotrexate 11/13/19   [provider]  ibuprofen (ADVIL) 200 MG tablet Take 200 mg by mouth every 6 (six) hours as needed for mild pain (inflammation pain).     [provider]  inFLIXimab-abda (RENFLEXIS IV) Inject 1 ampule into the vein once a week. Every two weeks, then once every six weeks    [provider]  KLOR-CON 10 10 MEQ tablet Take 10 mEq by mouth daily. 08/11/14   [provider]  levothyroxine (SYNTHROID) 50 MCG tablet Take 50 mcg by mouth daily.  08/16/18   [provider]  meclizine (ANTIVERT) 25 MG tablet Take 1 tablet (25 mg total) by mouth 3 (three) times daily as needed for dizziness. 05/14/20   Drenda Freeze, MD  medroxyPROGESTERone (PROVERA) 10 MG tablet Take 30 mg by mouth at bedtime. 06/16/20   [provider]  methotrexate (RHEUMATREX) 2.5 MG tablet Take 15 mg by mouth once a week. Monday evening    [provider]  mometasone-formoterol (DULERA) 200-5 MCG/ACT AERO Inhale 2 puffs into the lungs in the morning and at bedtime. 06/11/20   Brand Males, MD  mometasone-formoterol (DULERA) 200-5 MCG/ACT AERO Inhale 2 puffs into the lungs in the morning and at bedtime. 06/16/20   Brand Males, MD  nitroGLYCERIN (NITROSTAT) 0.4 MG SL tablet Place 1 tablet (0.4 mg total) under the tongue every 5 (five) minutes as needed for chest pain. Patient not taking: Reported on 06/19/2020 05/27/20   Josue Hector, MD  ondansetron (ZOFRAN ODT) 4 MG disintegrating tablet Take 1 tablet (4 mg total) by mouth every 8 (eight) hours as needed for nausea or vomiting. 01/01/20   Horton, Barbette Hair, MD  promethazine (PHENERGAN) 25 MG tablet Take 1 tablet (25 mg total) by mouth every 6 (six) hours as  needed for nausea or vomiting. 05/14/20   Drenda Freeze, MD  pyridOXINE (VITAMIN B-6) 100 MG tablet Take 100 mg by mouth daily.    [provider]  rosuvastatin (CRESTOR) 10 MG tablet Take 1 tablet (10 mg total) by mouth daily. 01/20/20 04/19/20  Burtis Junes, NP  torsemide (DEMADEX) 20 MG tablet Take 1 tablet (20 mg total) by mouth daily. 05/15/19   Burtis Junes, NP  verapamil (CALAN-SR) 240 MG CR tablet TAKE  1 TABLET BY MOUTH EVERY DAY 05/11/20   Burtis Junes, NP  Vitamin D, Ergocalciferol, (DRISDOL) 1.25 MG (50000 UNIT) CAPS capsule Take 50,000 Units by mouth every 7 (seven) days. Monday    [provider]    Allergies    Cat hair extract, Dust mite extract, and Macrobid [nitrofurantoin monohyd macro]  Review of Systems   Review of Systems  Constitutional: Positive for chills and fatigue. Negative for diaphoresis and fever.  HENT: Positive for congestion.   Eyes: Negative for visual disturbance.  Respiratory: Positive for cough, shortness of breath and wheezing. Negative for sputum production and chest tightness.   Cardiovascular: Negative for chest pain, palpitations and leg swelling.  Gastrointestinal: Negative for abdominal pain, constipation, diarrhea, nausea and vomiting.  Genitourinary: Negative for dysuria and flank pain.  Musculoskeletal: Negative for back pain, neck pain and neck stiffness.  Skin: Negative for rash and wound.  Neurological: Negative for dizziness, light-headedness and headaches.  Psychiatric/Behavioral: Negative for agitation and confusion.  All other systems reviewed and are negative.   Physical Exam Updated Vital Signs There were no vitals taken for this visit.  Physical Exam Vitals and nursing note reviewed.  Constitutional:      General: She is not in acute distress.    Appearance: She is well-developed. She is not ill-appearing, toxic-appearing or diaphoretic.  HENT:     Head: Normocephalic and atraumatic.  Eyes:      Conjunctiva/sclera: Conjunctivae normal.     Pupils: Pupils are equal, round, and reactive to light.  Cardiovascular:     Rate and Rhythm: Normal rate and regular rhythm.     Heart sounds: No murmur heard.   Pulmonary:     Effort: Pulmonary effort is normal. No tachypnea or respiratory distress.     Breath sounds: No stridor. Wheezing and rhonchi present. No decreased breath sounds or rales.  Chest:     Chest wall: No tenderness.  Abdominal:     Palpations: Abdomen is soft.     Tenderness: There is no abdominal tenderness.  Musculoskeletal:     Cervical back: Neck supple.     Right lower leg: No tenderness. No edema.     Left lower leg: No tenderness. No edema.  Skin:    General: Skin is warm and dry.     Capillary Refill: Capillary refill takes less than 2 seconds.     Findings: No erythema.  Neurological:     Mental Status: She is alert.  Psychiatric:        Mood and Affect: Mood normal.     ED Results / Procedures / Treatments   Labs (all labs ordered are listed, but only abnormal results are displayed) Labs Reviewed  RESP PANEL BY RT-PCR (FLU A&B, COVID) [id number] - Abnormal; Notable for the following components:      Result Value   SARS Coronavirus 2 by RT PCR POSITIVE (*)    All other components within normal limits    EKG EKG Interpretation  Date/Time:  Friday July 10 2020 08:51:12 EDT Ventricular Rate:  70 PR Interval:  129 QRS Duration: 84 QT Interval:  377 QTC Calculation: 407 R Axis:   59 Text Interpretation: Sinus rhythm Anteroseptal infarct, age indeterminate when compared to prior, similar appearance. No STEMI Confirmed by Antony Blackbird [phone]) on 07/10/2020 9:01:06 AM   Radiology DG Chest Portable 1 View  Result Date: 07/10/2020 CLINICAL DATA:  70 year old female status post exposure to COVID-19. With cough and shortness of breath. History of  rheumatoid arthritis, on immunotherapy. EXAM: PORTABLE CHEST 1 VIEW COMPARISON:  Chest radiographs  01/08/2020 and earlier. FINDINGS: Portable AP upright view at 0923 hours. Chronically large lung volumes and increased pulmonary interstitial markings appear stable from last year. Normal cardiac size and mediastinal contours. Visualized tracheal air column is within normal limits. No pneumothorax, pulmonary edema, pleural effusion or acute pulmonary opacity. No acute osseous abnormality identified. Negative visible bowel gas pattern. IMPRESSION: No acute cardiopulmonary abnormality. Electronically Signed   By: Genevie Ann M.D.   On: 07/10/2020 09:49    Procedures Procedures   Medications Ordered in ED Medications  remdesivir 100 mg in sodium chloride 0.9 % 100 mL IVPB (100 mg Intravenous New Bag/Given 07/10/20 1337)    ED Course  I have reviewed the triage vital signs and the nursing notes.  Pertinent labs & imaging results that were available during my care of the patient were reviewed by me and considered in my medical decision making (see chart for details).    MDM Rules/Calculators/A&P                          TIKI TUCCIARONE is a 70 y.o. female with a past medical history significant for hypertension, hyperthyroidism, rheumatoid arthritis, osteoporosis, CAD, and prior vertigo who presents with chills, dry cough, and mild shortness of breath with COVID exposure.  Patient reports that yesterday, her husband was seen at this facility and was treated for multiple problems but was ultimately determined to have coronavirus.  Patient reports that starting yesterday, she was feeling some general malaise but was more focused on taking care of him.  She says that overnight and today, she has had some chills, dry cough, and felt more short of breath.  She reports that her asthma has overall been doing very well as has rheumatoid arthritis.  She reports she is having some general myalgias and malaise and thinks that her rheumatoid is starting to flareup.  She reports it is only mild at this time.  She denies  any chest pain or palpitations.  Denies nausea, vomiting, constipation, or diarrhea.  Denies any trauma.  Reports her shortness of breath is mild but she is concerned she may have COVID-19 and wanted to be evaluated.  She denies other complaints including no severe headache, neck pain, neck stiffness, urinary symptoms, or GI symptoms otherwise.  On exam, lungs have a very faint wheeze and some mild rhonchi.  Chest and back are nontender.  Abdomen is nontender.  Normal bowel sounds.  No focal neurologic deficits initially the patient moving all extremities.  Good pulses in extremities.  No lower extremity tenderness or edema seen.  She does not report DVT or PE history and otherwise has reassuring vital signs on arrival.  She is maintaining oxygen saturations of 100% on room air and is not febrile, tachycardic, tachypneic, or hypotensive.  EKG shows no STEMI and appears similar to prior.  Clinically I suspect patient does have COVID-19 given the exposure to her husband.  We we will test her for COVID to confirm as given her underlying past medical problems, she would likely qualify for outpatient antiviral therapy.  We will also get a chest x-ray given the rhonchi and her history of lung disease with the symptoms.  Given the lack of any chest pain or hypoxia or tachycardia, low suspicion for pulmonary embolism or other more nefarious cause of shortness of breath at this time.  We will hold on  more advanced imaging or labs.  Patient agrees at this time.  Anticipate reassessment and discussion with pharmacy about outpatient management when x-ray and COVID test is completed.  Patient was offered albuterol for the mild wheezing but she reports it is not severe and she would rather wait.  As anticipated, patient was found to have COVID-19.  X-ray shows no pneumonia.  I spoke with pharmacy who recommended remdesivir.  Plan of care is to discharge patient after MDI severe and give her amatory referral to the  outpatient [facility] for further remdesivir treatments.  Patient did not have hypoxia and agreed with plan of care.  Patient be discharged to follow-up with outpatient [facility] clinic.    Final Clinical Impression(s) / ED Diagnoses Final diagnoses:  [id number]    Rx / DC Orders ED Discharge Orders         Ordered    Ambulatory referral for Covid Treatment        07/10/20 1536          Clinical Impression: 1. COVID-19     Disposition: Discharge  Condition: Good  I have discussed the results, Dx and Tx plan with the pt(& family if present). He/she/they expressed understanding and agree(s) with the plan. Discharge instructions discussed at great length. Strict return precautions discussed and pt &/or family have verbalized understanding of the instructions. No further questions at time of discharge.    New Prescriptions   No medications on file    Follow Up: Reyn[facility] Bowen, [place] [id number] [phone]     the outpatient covid clinic     [facility] [place] [address] [phone]       Sekou Zuckerman, Gwenyth Allegra, MD 07/10/20 1537

## 2020-07-10 NOTE — ED Triage Notes (Signed)
Shortness of breath

## 2020-07-10 NOTE — Discharge Instructions (Signed)
Your work-up confirmed you do have COVID-19 as expected.  The x-ray did not show any pneumonia or any collapsed lung.  Based on your reassuring oxygen saturations, we feel you are safe for discharge home after you receive the remdesivir treatment.  With pharmacy recommendations, I wrote an ambulatory referral to the [facility] treatment clinic so they should call you to schedule follow-up treatments.  Please rest and stay hydrated.  If any symptoms change or worsen acutely, please return to the nearest emergency department.

## 2020-07-11 ENCOUNTER — Telehealth: Payer: Self-pay | Admitting: Unknown Physician Specialty

## 2020-07-11 NOTE — Telephone Encounter (Signed)
I connected by phone with [name] on 07/11/2020 at 12:57 PM to discuss the potential use of the antiviral REMDESIVIR for acute COVID-19 viral infection in non-hospitalized patients.   Pt had infusion x1 in the ER  This patient is a 70 y.o. female that meets the FDA criteria for Emergency Use Authorization of [place].  Has a (+) direct SARS-CoV-2 viral test result  Has mild or moderate symptoms related to COVID-19  Is within 7 days of symptom onset  Has at least one of the high risk factor(s) for progression to severe COVID-19 and/or hospitalization as defined in NIH Guidelines and EUA.   Specific high risk criteria : Immunosuppressive Disease or Treatment   I have spoken and communicated the following to the patient or parent/caregiver regarding COVID-19 IV Antiviral treatment:  1. FDA has authorized the emergency use for the treatment of mild to moderate COVID-19 in adults and pediatric patients with positive results of SARS-CoV-2 testing who are 5 years of age and older weighing at least 40 kg, and who are at high risk for progressing to severe COVID-19 and/or hospitalization.  2. The significant known and potential risks and benefits in receiving REMDESIVIR in accordance with current NIH treatment guidelines.   3. Information on available alternative treatments and the risks and benefits of those alternatives, including clinical trials that may be accessible to the patient.   4. Patients treated with antiviral therapy should continue to isolate and use infection control measures (e.g., wear mask, isolate, social distance, avoid sharing personal items, clean and disinfect "high touch" surfaces, and frequent handwashing) according to CDC guidelines.   5. The patient or parent/caregiver has the option to accept or refuse REMDESIVIR therapy and has had the opportunity to have all questions addressed prior to consenting for treatment.    After reviewing this information with the  patient, he/she has decided to proceed with the 3 day course of treatment.   Kathrine Haddock, NP 07/11/2020  12:57 PM

## 2020-07-11 NOTE — Telephone Encounter (Signed)
Called to discuss with patient about COVID-19 symptoms and the use of one of the available treatments for those with mild to moderate Covid symptoms and at a high risk of hospitalization.  Pt appears to qualify for outpatient treatment due to co-morbid conditions and/or a member of an at-risk group in accordance with the FDA Emergency Use Authorization.    Symptom onset: 4/14 Vaccinated: yes Booster? Yes x1 Immunocompromised? yes Qualifiers: Multiple  Unable to reach pt - LMOM and mychart  [employer]

## 2020-07-12 ENCOUNTER — Telehealth: Payer: Self-pay | Admitting: Unknown Physician Specialty

## 2020-07-12 MED ORDER — NIRMATRELVIR/RITONAVIR (PAXLOVID)TABLET
3.0000 | ORAL_TABLET | Freq: Two times a day (BID) | ORAL | 0 refills | Status: AC
Start: 2020-07-12 — End: 2020-07-17

## 2020-07-12 NOTE — Telephone Encounter (Signed)
Outpatient Oral COVID Treatment Note  I connected with Brandy Townsend on 07/12/2020/12:41 PM by telephone and verified that I am speaking with the correct person using two identifiers.  I discussed the limitations, risks, security, and privacy concerns of performing an evaluation and management service by telephone and the availability of in person appointments. I also discussed with the patient that there may be a patient responsible charge related to this service. The patient expressed understanding and agreed to proceed.  Patient location: home Provider location: home  Diagnosis: COVID-19 infection  Purpose of visit: Discussion of potential use of Molnupiravir or Paxlovid, a new treatment for mild to moderate COVID-19 viral infection in non-hospitalized patients.   Subjective: Patient is a 70 y.o. female who has been diagnosed with COVID 19 viral infection.  Their symptoms began on 4/13 with congestion.    Past Medical History:  Diagnosis Date  . Asthma   . CAD (coronary artery disease)   . Graves disease    s/p RAI  . High cholesterol   . Hypertension   . Osteoporosis   . Rheumatoid arthritis ([place])   . Thyroid disease   . Vertigo     Allergies  Allergen Reactions  . Cat Hair Extract     Other reaction(s): congestion  . Dust Mite Extract     Other reaction(s): Unknown  . Macrobid [employer] Macro] Other (See Comments)    Lowered potassium, caused aches and pains     Current Outpatient Medications:  .  acetaminophen (TYLENOL) 500 MG tablet, Take 500 mg by mouth every 6 (six) hours as needed for moderate pain., Disp: , Rfl:  .  albuterol (VENTOLIN HFA) 108 (90 Base) MCG/ACT inhaler, Inhale 1-2 puffs into the lungs every 6 (six) hours as needed for wheezing or shortness of breath., Disp: 8 g, Rfl: 2 .  aspirin EC 81 MG tablet, Take 1 tablet (81 mg total) by mouth daily. Swallow whole., Disp: 90 tablet, Rfl: 3 .  cetirizine (ZYRTEC) 10 MG tablet, Take 10 mg by  mouth at bedtime as needed for allergies. , Disp: , Rfl:  .  fluticasone furoate-vilanterol (BREO ELLIPTA) 100-25 MCG/INH AEPB, Inhale 1 puff into the lungs daily., Disp: 28 each, Rfl: 0 .  folic acid (FOLVITE) 1 MG tablet, Take 1 mg by mouth daily. Only takes on days she takes methotrexate, Disp: , Rfl:  .  ibuprofen (ADVIL) 200 MG tablet, Take 200 mg by mouth every 6 (six) hours as needed for mild pain (inflammation pain). , Disp: , Rfl:  .  inFLIXimab-abda (RENFLEXIS IV), Inject 1 ampule into the vein once a week. Every two weeks, then once every six weeks, Disp: , Rfl:  .  KLOR-CON 10 10 MEQ tablet, Take 10 mEq by mouth daily., Disp: , Rfl: 0 .  levothyroxine (SYNTHROID) 50 MCG tablet, Take 50 mcg by mouth daily. , Disp: , Rfl:  .  meclizine (ANTIVERT) 25 MG tablet, Take 1 tablet (25 mg total) by mouth 3 (three) times daily as needed for dizziness., Disp: 10 tablet, Rfl: 0 .  medroxyPROGESTERone (PROVERA) 10 MG tablet, Take 30 mg by mouth at bedtime., Disp: , Rfl:  .  methotrexate (RHEUMATREX) 2.5 MG tablet, Take 15 mg by mouth once a week. Monday evening, Disp: , Rfl:  .  mometasone-formoterol (DULERA) 200-5 MCG/ACT AERO, Inhale 2 puffs into the lungs in the morning and at bedtime., Disp: 13 g, Rfl: 5 .  mometasone-formoterol (DULERA) 200-5 MCG/ACT AERO, Inhale 2 puffs into  the lungs in the morning and at bedtime., Disp: 1 each, Rfl: 11 .  nitroGLYCERIN (NITROSTAT) 0.4 MG SL tablet, Place 1 tablet (0.4 mg total) under the tongue every 5 (five) minutes as needed for chest pain. (Patient not taking: Reported on 06/19/2020), Disp: 25 tablet, Rfl: 3 .  ondansetron (ZOFRAN ODT) 4 MG disintegrating tablet, Take 1 tablet (4 mg total) by mouth every 8 (eight) hours as needed for nausea or vomiting., Disp: 20 tablet, Rfl: 0 .  promethazine (PHENERGAN) 25 MG tablet, Take 1 tablet (25 mg total) by mouth every 6 (six) hours as needed for nausea or vomiting., Disp: 10 tablet, Rfl: 0 .  pyridOXINE (VITAMIN B-6)  100 MG tablet, Take 100 mg by mouth daily., Disp: , Rfl:  .  rosuvastatin (CRESTOR) 10 MG tablet, Take 1 tablet (10 mg total) by mouth daily., Disp: 90 tablet, Rfl: 3 .  torsemide (DEMADEX) 20 MG tablet, Take 1 tablet (20 mg total) by mouth daily., Disp: 90 tablet, Rfl: 3 .  verapamil (CALAN-SR) 240 MG CR tablet, TAKE 1 TABLET BY MOUTH EVERY DAY, Disp: 90 tablet, Rfl: 3 .  Vitamin D, Ergocalciferol, (DRISDOL) 1.25 MG (50000 UNIT) CAPS capsule, Take 50,000 Units by mouth every 7 (seven) days. Monday, Disp: , Rfl:   Objective: Patient appears/sounds congested with cough.  They are in no apparent distress.  Breathing is non labored.  Mood and behavior are normal.  Laboratory Data:  Recent Results (from the past 2160 hour(s))  CBC with Differential/Platelet     Status: Abnormal   Collection Time: 05/14/20  2:00 PM  Result Value Ref Range   WBC 8.5 4.0 - 10.5 K/uL   RBC 4.60 3.87 - 5.11 MIL/uL   Hemoglobin 13.9 12.0 - 15.0 g/dL   HCT 42.9 36.0 - 46.0 %   MCV 93.3 80.0 - 100.0 fL   MCH 30.2 26.0 - 34.0 pg   MCHC 32.4 30.0 - 36.0 g/dL   RDW 13.2 11.5 - 15.5 %   Platelets 323 150 - 400 K/uL   nRBC 0.0 0.0 - 0.2 %   Neutrophils Relative % 52 %   Neutro Abs 4.5 1.7 - 7.7 K/uL   Lymphocytes Relative 28 %   Lymphs Abs 2.4 0.7 - 4.0 K/uL   Monocytes Relative 14 %   Monocytes Absolute 1.2 (H) 0.1 - 1.0 K/uL   Eosinophils Relative 5 %   Eosinophils Absolute 0.5 0.0 - 0.5 K/uL   Basophils Relative 1 %   Basophils Absolute 0.0 0.0 - 0.1 K/uL   Immature Granulocytes 0 %   Abs Immature Granulocytes 0.03 0.00 - 0.07 K/uL    Comment: Performed at [facility], [place] [address], [place], [place] [address]  Comprehensive metabolic panel     Status: Abnormal   Collection Time: 05/14/20  2:00 PM  Result Value Ref Range   Sodium 139 135 - 145 mmol/L   Potassium 3.1 (L) 3.5 - 5.1 mmol/L   Chloride 97 (L) 98 - 111 mmol/L   CO2 31 22 - 32 mmol/L   Glucose, Bld 98 70 - 99 mg/dL     Comment: Glucose reference range applies only to samples taken after fasting for at least 8 hours.   BUN 13 8 - 23 mg/dL   Creatinine, Ser 0.85 0.44 - 1.00 mg/dL   Calcium 9.4 8.9 - 10.3 mg/dL   Total Protein 7.5 6.5 - 8.1 g/dL   Albumin 4.4 3.5 - 5.0 g/dL   AST 18  15 - 41 U/L   ALT 25 0 - 44 U/L   Alkaline Phosphatase 81 38 - 126 U/L   Total Bilirubin 0.6 0.3 - 1.2 mg/dL   GFR, Estimated >60 >60 mL/min    Comment: (NOTE) Calculated using the CKD-EPI Creatinine Equation (2021)    Anion gap 11 5 - 15    Comment: Performed at [facility], [place] [address] [place], [place] [address]  Lipase, blood     Status: None   Collection Time: 05/14/20  2:00 PM  Result Value Ref Range   Lipase 26 11 - 51 U/L    Comment: Performed at [facility], [place] [address], [place], [place] [address]  Urinalysis, Routine w reflex microscopic Urine, Clean Catch     Status: Abnormal   Collection Time: 05/14/20  2:00 PM  Result Value Ref Range   Color, Urine YELLOW YELLOW   APPearance HAZY (A) CLEAR   Specific Gravity, Urine 1.013 1.005 - 1.030   pH 5.0 5.0 - 8.0   Glucose, UA NEGATIVE NEGATIVE mg/dL   Hgb urine dipstick NEGATIVE NEGATIVE   Bilirubin Urine NEGATIVE NEGATIVE   Ketones, ur NEGATIVE NEGATIVE mg/dL   Protein, ur NEGATIVE NEGATIVE mg/dL   Nitrite NEGATIVE NEGATIVE   Leukocytes,Ua LARGE (A) NEGATIVE   RBC / HPF 11-20 0 - 5 RBC/hpf   WBC, UA 21-50 0 - 5 WBC/hpf   Bacteria, UA RARE (A) NONE SEEN   Squamous Epithelial / LPF 0-5 0 - 5   Mucus PRESENT    Hyaline Casts, UA PRESENT    Ca Oxalate Crys, UA PRESENT    Non Squamous Epithelial 0-5 (A) NONE SEEN    Comment: Performed at [facility], [place] [address], [place], [place] [address]  Urine Culture     Status: Abnormal   Collection Time: 05/14/20  2:00 PM   Specimen: Urine, Clean Catch  Result Value Ref Range   Specimen Description      URINE, CLEAN CATCH Performed at [facility], [place] [address], [place], [place] [address]    Special Requests      NONE Performed at [facility], [place] [address], [place], [place] [address]    Culture MULTIPLE SPECIES PRESENT, SUGGEST RECOLLECTION (A)    Report Status 05/16/2020 FINAL   Resp Panel by RT-PCR (Flu A&B, Covid) Nasopharyngeal Swab     Status: Abnormal   Collection Time: 07/10/20  9:02 AM   Specimen: Nasopharyngeal Swab; Nasopharyngeal(NP) swabs in vial transport medium  Result Value Ref Range   SARS Coronavirus 2 by RT PCR POSITIVE (A) NEGATIVE    Comment: RESULT CALLED TO, READ BACK BY AND VERIFIED WITH: REED,C,RN @ 1117 07/10/20 BY GWYN,P (NOTE) SARS-CoV-2 target nucleic acids are DETECTED.  The SARS-CoV-2 RNA is generally detectable in upper respiratory specimens during the acute phase of infection. Positive results are indicative of the presence of the identified virus, but do not rule out bacterial infection or co-infection with other pathogens not detected by the test. Clinical correlation with patient history and other diagnostic information is necessary to determine patient infection status. The expected result is Negative.  Fact Sheet for Patients: EntrepreneurPulse.com.au  Fact Sheet for Healthcare Providers: IncredibleEmployment.be  This test is not yet approved or cleared by the [place] FDA and  has been authorized for detection and/or diagnosis of SARS-CoV-2 by FDA under an Emergency Use Authorization (EUA).  This EUA will remain in effect (meaning this  test can b e used) for the duration of  the COVID-19 declaration under Section 564(b)(1) of the Act, 21 U.S.C. section 360bbb-3(b)(1), unless the authorization is terminated or revoked sooner.     Influenza A by PCR NEGATIVE NEGATIVE   Influenza B by PCR NEGATIVE NEGATIVE    Comment: (NOTE) The Xpert Xpress SARS-CoV-2/FLU/RSV plus assay is intended as an aid in the  diagnosis of influenza from Nasopharyngeal swab specimens and should not be used as a sole basis for treatment. Nasal washings and aspirates are unacceptable for Xpert Xpress SARS-CoV-2/FLU/RSV testing.  Fact Sheet for Patients: EntrepreneurPulse.com.au  Fact Sheet for Healthcare Providers: IncredibleEmployment.be  This test is not yet approved or cleared by the [place] FDA and has been authorized for detection and/or diagnosis of SARS-CoV-2 by FDA under an Emergency Use Authorization (EUA). This EUA will remain in effect (meaning this test can be used) for the duration of the COVID-19 declaration under Section 564(b)(1) of the Act, 21 U.S.C. section 360bbb-3(b)(1), unless the authorization is terminated or revoked.  Performed at [facility]      Assessment: 70 y.o. female with mild/moderate COVID 19 viral infection diagnosed on 4/13 at high risk for progression to severe COVID 19.  Plan:  This patient is a 70 y.o. female that meets the following criteria for Emergency Use Authorization of: Paxlovid 1. Age >12 yr AND > 40 kg 2. SARS-COV-2 positive test 3. Symptom onset < 5 days 4. Mild-to-moderate COVID disease with high risk for severe progression to hospitalization or death  I have spoken and communicated the following to the patient or parent/caregiver regarding: 1. Paxlovid is an unapproved drug that is authorized for use under an Emergency Use Authorization.  2. There are no adequate, approved, available products for the treatment of COVID-19 in adults who have mild-to-moderate COVID-19 and are at high risk for progressing to severe COVID-19, including hospitalization or death. 3. Other therapeutics are currently authorized. For additional information on all products authorized for treatment or prevention of COVID-19, please see  [employer].pl.  4. There are benefits and risks of taking this treatment as outlined in the "Fact Sheet for Patients and Caregivers."  5. "Fact Sheet for Patients and Caregivers" was reviewed with patient. A hard copy will be provided to patient from pharmacy prior to the patient receiving treatment. 6. Patients should continue to self-isolate and use infection control measures (e.g., wear mask, isolate, social distance, avoid sharing personal items, clean and disinfect "high touch" surfaces, and frequent handwashing) according to CDC guidelines.  7. The patient or parent/caregiver has the option to accept or refuse treatment. 8. Patient medication history was reviewed for potential drug interactions:Interaction with home meds: Breo and Rovuvostatin to hold 9. Patient's GFR was calculated to be  10. >60, and they were therefore prescribed Normal dose (GFR>60) - nirmatrelvir 150mg  tab (2 tablet) by mouth twice daily AND ritonavir 100mg  tab (1 tablet) by mouth twice daily   After reviewing above information with the patient, the patient agrees to receive Paxlovid.  Follow up instructions:    . Take prescription BID x 5 days as directed . Reach out to pharmacist for counseling on medication if desired . For concerns regarding further COVID symptoms please follow up with your PCP or urgent care . For urgent or life-threatening issues, seek care at your local emergency department  The patient was provided an opportunity to ask questions, and all were answered. The patient agreed with the plan and demonstrated an understanding  of the instructions.   Script sent to [facility] in [place] The patient was advised to call their PCP or seek an in-person evaluation if the symptoms worsen or if the condition fails to improve as anticipated.   I provided 20 minutes of non face-to-face telephone visit  time during this encounter, and > 50% was spent counseling as documented under my assessment & plan.  Kathrine Haddock, NP 07/12/2020 /12:41 PM

## 2020-07-14 ENCOUNTER — Other Ambulatory Visit: Payer: Self-pay

## 2020-07-14 ENCOUNTER — Telehealth: Payer: Self-pay | Admitting: Internal Medicine

## 2020-07-14 ENCOUNTER — Ambulatory Visit (INDEPENDENT_AMBULATORY_CARE_PROVIDER_SITE_OTHER): Payer: Medicare Other

## 2020-07-14 ENCOUNTER — Other Ambulatory Visit: Payer: Self-pay | Admitting: Physician Assistant

## 2020-07-14 DIAGNOSIS — I10 Essential (primary) hypertension: Secondary | ICD-10-CM

## 2020-07-14 DIAGNOSIS — M069 Rheumatoid arthritis, unspecified: Secondary | ICD-10-CM

## 2020-07-14 DIAGNOSIS — U071 COVID-19: Secondary | ICD-10-CM

## 2020-07-14 MED ORDER — ALBUTEROL SULFATE HFA 108 (90 BASE) MCG/ACT IN AERS
2.0000 | INHALATION_SPRAY | Freq: Once | RESPIRATORY_TRACT | Status: AC | PRN
Start: 2020-07-14 — End: 2020-07-14

## 2020-07-14 MED ORDER — BREO ELLIPTA 100-25 MCG/INH IN AEPB: 1.0000 | INHALATION_SPRAY | Freq: Every day | RESPIRATORY_TRACT | 5 refills | Status: DC

## 2020-07-14 MED ORDER — BEBTELOVIMAB 175 MG/2 ML IV (EUA)
175.0000 mg | Freq: Once | INTRAMUSCULAR | Status: AC
  Administered 2020-07-14: 175 mg via INTRAVENOUS

## 2020-07-14 MED ORDER — EPINEPHRINE 0.3 MG/0.3ML IJ SOAJ: 0.3000 mg | Freq: Once | INTRAMUSCULAR | Status: AC | PRN

## 2020-07-14 MED ORDER — METHYLPREDNISOLONE SODIUM SUCC 125 MG IJ SOLR: 125.0000 mg | Freq: Once | INTRAMUSCULAR | Status: AC | PRN

## 2020-07-14 MED ORDER — DIPHENHYDRAMINE HCL 50 MG/ML IJ SOLN: 50.0000 mg | Freq: Once | INTRAMUSCULAR | Status: AC | PRN

## 2020-07-14 MED ORDER — FAMOTIDINE IN NACL 20-0.9 MG/50ML-% IV SOLN: 20.0000 mg | Freq: Once | INTRAVENOUS | Status: AC | PRN

## 2020-07-14 MED ORDER — SODIUM CHLORIDE 0.9 % IV SOLN: INTRAVENOUS | Status: DC | PRN

## 2020-07-14 NOTE — Progress Notes (Signed)
I connected by phone with Brandy Townsend on 07/14/2020 at 2:50 PM to discuss the potential use of a new treatment for mild to moderate COVID-19 viral infection in non-hospitalized patients.  This patient is a 70 y.o. female that meets the FDA criteria for Emergency Use Authorization of COVID monoclonal antibody bebtelovimab.  Has a (+) direct SARS-CoV-2 viral test result  Has mild or moderate COVID-19   Is NOT hospitalized due to COVID-19  Is within 10 days of symptom onset  Has at least one of the high risk factor(s) for progression to severe COVID-19 and/or hospitalization as defined in EUA.  Specific high risk criteria : Older age (>/= 68 yo), BMI > 25 and Cardiovascular disease or hypertension, immunosuppressed on MTX   I have spoken and communicated the following to the patient or parent/caregiver regarding COVID monoclonal antibody treatment:  1. FDA has authorized the emergency use for the treatment of mild to moderate COVID-19 in adults and pediatric patients with positive results of direct SARS-CoV-2 viral testing who are 53 years of age and older weighing at least 40 kg, and who are at high risk for progressing to severe COVID-19 and/or hospitalization.  2. The significant known and potential risks and benefits of COVID monoclonal antibody, and the extent to which such potential risks and benefits are unknown.  3. Information on available alternative treatments and the risks and benefits of those alternatives, including clinical trials.  4. Patients treated with COVID monoclonal antibody should continue to self-isolate and use infection control measures (e.g., wear mask, isolate, social distance, avoid sharing personal items, clean and disinfect "high touch" surfaces, and frequent handwashing) according to CDC guidelines.   5. The patient or parent/caregiver has the option to accept or refuse COVID monoclonal antibody treatment.  6. Discussion about the monoclonal antibody  infusion does not ensure treatment. The patient will be placed on a list and scheduled according to risk, symptom onset and availability. A scheduler will reach to the patient to let them know if we can accommodate their infusion or not.  After reviewing this information with the patient, the patient has agreed to receive one of the available covid 19 monoclonal antibodies and will be provided an appropriate fact sheet prior to infusion. Angelena Form, PA-C 07/14/2020 2:50 PM

## 2020-07-14 NOTE — Telephone Encounter (Signed)
Spoke with pt, states that her application for [facility] patient assistance has been denied, and wishes to switch back to [place]. Pt would like a Breo rx to be sent to [address].  MR ok to switch back to [facility]?  Pt also mentioned that she was seen in ED on 4/15 and dx'ed with Covid, received 1 treatment of Remdesivir in the ED, and has been in contact with a NP via Mychart to get 2 more infusions. Pt was asking about when to get her 2nd infusion.  I advised pt to reach out to the NP who was previously discussing this with her for further guidance. Pt expressed understanding.

## 2020-07-14 NOTE — Telephone Encounter (Signed)
Okay to switch to [facility].  Please make sure the right dose is being called in.  Please double check on the right prior dose of Breo  Regarding COVID.  Sorry to hear this.  She should recover from this.  You are correct that patient should coordinate this with the "[place] [facility]

## 2020-07-14 NOTE — Patient Instructions (Signed)
10 Things You Can Do to Manage Your COVID-19 Symptoms at Home If you have possible or confirmed COVID-19: 1. Stay home except to get medical care. 2. Monitor your symptoms carefully. If your symptoms get worse, call your healthcare provider immediately. 3. Get rest and stay hydrated. 4. If you have a medical appointment, call the healthcare provider ahead of time and tell them that you have or may have COVID-19. 5. For medical emergencies, call 911 and notify the dispatch personnel that you have or may have COVID-19. 6. Cover your cough and sneezes with a tissue or use the inside of your elbow. 7. Wash your hands often with soap and water for at least 20 seconds or clean your hands with an alcohol-based hand sanitizer that contains at least 60% alcohol. 8. As much as possible, stay in a specific room and away from other people in your home. Also, you should use a separate bathroom, if available. If you need to be around other people in or outside of the home, wear a mask. 9. Avoid sharing personal items with other people in your household, like dishes, towels, and bedding. 10. Clean all surfaces that are touched often, like counters, tabletops, and doorknobs. Use household cleaning sprays or wipes according to the label instructions. cdc.gov/coronavirus 10/11/2019 This information is not intended to replace advice given to you by your health care provider. Make sure you discuss any questions you have with your health care provider. Document Revised: 01/27/2020 Document Reviewed: 01/27/2020 Elsevier Patient Education  2021 Elsevier Inc.  What types of side effects do monoclonal antibody drugs cause?  Common side effects  In general, the more common side effects caused by monoclonal antibody drugs include: . Allergic reactions, such as hives or itching . Flu-like signs and symptoms, including chills, fatigue, fever, and muscle aches and pains . Nausea, vomiting . Diarrhea . Skin  rashes . Low blood pressure   The CDC is recommending patients who receive monoclonal antibody treatments wait at least 90 days before being vaccinated.  Currently, there are no data on the safety and efficacy of mRNA COVID-19 vaccines in persons who received monoclonal antibodies or convalescent plasma as part of COVID-19 treatment. Based on the estimated half-life of such therapies as well as evidence suggesting that reinfection is uncommon in the 90 days after initial infection, vaccination should be deferred for at least 90 days, as a precautionary measure until additional information becomes available, to avoid interference of the antibody treatment with vaccine-induced immune responses.   If someone you know is interested in receiving treatment please have them contact their MD for a referral or visit [email or web address]    

## 2020-07-14 NOTE — Telephone Encounter (Signed)
Pt aware of recs. breo sent to preferred pharmacy. Med list updated to reflect change. Nothing further needed at this time- will close encounter.

## 2020-07-14 NOTE — Progress Notes (Signed)
Diagnosis: COVID  Provider:  Marshell Garfinkel, MD  Procedure: Infusion  IV Type: Peripheral, IV Location: R Antecubital  Bebtelovimab, Dose: 175mg    Infusion Start Time: 1507  Infusion Stop Time: 1607  Post Infusion IV Care: Observation period completed and Peripheral IV Discontinued  Discharge: Condition: Good, Destination: Home . AVS provided to patient.   Performed by:  Koren Shiver, RN

## 2020-07-30 DIAGNOSIS — M0579 Rheumatoid arthritis with rheumatoid factor of multiple sites without organ or systems involvement: Secondary | ICD-10-CM | POA: Diagnosis not present

## 2020-08-13 DIAGNOSIS — M0579 Rheumatoid arthritis with rheumatoid factor of multiple sites without organ or systems involvement: Secondary | ICD-10-CM | POA: Diagnosis not present

## 2020-09-01 ENCOUNTER — Emergency Department (HOSPITAL_COMMUNITY)
Admission: EM | Admit: 2020-09-01 | Discharge: 2020-09-01 | Disposition: A | Discharge status: Home / Self Care | Payer: Medicare Other | Attending: Emergency Medicine | Admitting: Emergency Medicine

## 2020-09-01 ENCOUNTER — Other Ambulatory Visit: Payer: Self-pay

## 2020-09-01 ENCOUNTER — Emergency Department (HOSPITAL_COMMUNITY): Payer: Medicare Other

## 2020-09-01 DIAGNOSIS — I10 Essential (primary) hypertension: Secondary | ICD-10-CM | POA: Diagnosis not present

## 2020-09-01 DIAGNOSIS — J45909 Unspecified asthma, uncomplicated: Secondary | ICD-10-CM | POA: Diagnosis not present

## 2020-09-01 DIAGNOSIS — R002 Palpitations: Secondary | ICD-10-CM | POA: Insufficient documentation

## 2020-09-01 DIAGNOSIS — R519 Headache, unspecified: Secondary | ICD-10-CM | POA: Insufficient documentation

## 2020-09-01 DIAGNOSIS — Z79899 Other long term (current) drug therapy: Secondary | ICD-10-CM | POA: Insufficient documentation

## 2020-09-01 DIAGNOSIS — R55 Syncope and collapse: Secondary | ICD-10-CM | POA: Diagnosis not present

## 2020-09-01 DIAGNOSIS — Z87891 Personal history of nicotine dependence: Secondary | ICD-10-CM | POA: Insufficient documentation

## 2020-09-01 DIAGNOSIS — E039 Hypothyroidism, unspecified: Secondary | ICD-10-CM | POA: Diagnosis not present

## 2020-09-01 DIAGNOSIS — R42 Dizziness and giddiness: Secondary | ICD-10-CM | POA: Diagnosis not present

## 2020-09-01 DIAGNOSIS — I959 Hypotension, unspecified: Secondary | ICD-10-CM | POA: Diagnosis not present

## 2020-09-01 DIAGNOSIS — Z9861 Coronary angioplasty status: Secondary | ICD-10-CM | POA: Insufficient documentation

## 2020-09-01 DIAGNOSIS — Z7982 Long term (current) use of aspirin: Secondary | ICD-10-CM | POA: Diagnosis not present

## 2020-09-01 DIAGNOSIS — I2510 Atherosclerotic heart disease of native coronary artery without angina pectoris: Secondary | ICD-10-CM | POA: Diagnosis not present

## 2020-09-01 DIAGNOSIS — G4489 Other headache syndrome: Secondary | ICD-10-CM | POA: Diagnosis not present

## 2020-09-01 LAB — TSH: TSH: 1.549 u[IU]/mL (ref 0.350–4.500)

## 2020-09-01 LAB — BASIC METABOLIC PANEL
Anion gap: 8 (ref 5–15)
BUN: 11 mg/dL (ref 8–23)
CO2: 26 mmol/L (ref 22–32)
Calcium: 9.1 mg/dL (ref 8.9–10.3)
Chloride: 105 mmol/L (ref 98–111)
Creatinine, Ser: 0.89 mg/dL (ref 0.44–1.00)
GFR, Estimated: >60 mL/min (ref >60)
Glucose, Bld: 94 mg/dL (ref 70–99)
Potassium: 4.2 mmol/L (ref 3.5–5.1)
Sodium: 139 mmol/L (ref 135–145)

## 2020-09-01 LAB — URINALYSIS, ROUTINE W REFLEX MICROSCOPIC
Bilirubin Urine: NEGATIVE
Glucose, UA: NEGATIVE mg/dL
Hgb urine dipstick: NEGATIVE
Ketones, ur: NEGATIVE mg/dL
Leukocytes,Ua: NEGATIVE
Nitrite: NEGATIVE
Protein, ur: NEGATIVE mg/dL
Specific Gravity, Urine: 1.004 — ABNORMAL LOW (ref 1.005–1.030)
pH: 8 (ref 5.0–8.0)

## 2020-09-01 LAB — CBC
HCT: 44.5 % (ref 36.0–46.0)
Hemoglobin: 14.8 g/dL (ref 12.0–15.0)
MCH: 30.7 pg (ref 26.0–34.0)
MCHC: 33.3 g/dL (ref 30.0–36.0)
MCV: 92.3 fL (ref 80.0–100.0)
Platelets: 331 10*3/uL (ref 150–400)
RBC: 4.82 MIL/uL (ref 3.87–5.11)
RDW: 14 % (ref 11.5–15.5)
WBC: 8 10*3/uL (ref 4.0–10.5)
nRBC: 0 % (ref 0.0–0.2)

## 2020-09-01 LAB — CBG MONITORING, ED: Glucose-Capillary: 82 mg/dL (ref 70–99)

## 2020-09-01 LAB — TROPONIN I (HIGH SENSITIVITY)
Troponin I (High Sensitivity): 3 ng/L (ref <18)
Troponin I (High Sensitivity): 6 ng/L (ref <18)

## 2020-09-01 MED ORDER — FENTANYL CITRATE (PF) 100 MCG/2ML IJ SOLN
50.0000 ug | Freq: Once | INTRAMUSCULAR | Status: AC
  Administered 2020-09-01: 50 ug via INTRAVENOUS
  Filled 2020-09-01: qty 2

## 2020-09-01 MED ORDER — METOCLOPRAMIDE HCL 5 MG/ML IJ SOLN
5.0000 mg | Freq: Once | INTRAMUSCULAR | Status: AC
  Administered 2020-09-01: 5 mg via INTRAVENOUS
  Filled 2020-09-01: qty 2

## 2020-09-01 MED ORDER — METOPROLOL TARTRATE 25 MG PO TABS: 12.5000 mg | ORAL_TABLET | ORAL | 0 refills | Status: DC | PRN

## 2020-09-01 MED ORDER — ACETAMINOPHEN 325 MG PO TABS
650.0000 mg | ORAL_TABLET | Freq: Once | ORAL | Status: AC
  Administered 2020-09-01: 650 mg via ORAL
  Filled 2020-09-01: qty 2

## 2020-09-01 NOTE — ED Notes (Signed)
Pt ambulated to the bathroom.  

## 2020-09-01 NOTE — ED Triage Notes (Signed)
BIB [facility] after pt called to report near syncopal episode, dizziness, and blurred vision and c/o of pounding HA.     HX stent placement, HTN, RA, hypothyroidism

## 2020-09-01 NOTE — ED Notes (Signed)
Patient transported to CT 

## 2020-09-01 NOTE — ED Notes (Signed)
Patient verbalizes understanding of discharge instructions. Opportunity for questioning and answers were provided. Armband removed by staff, pt discharged from ED and ambulated to lobby to return home.   

## 2020-09-01 NOTE — ED Provider Notes (Signed)
[facility] EMERGENCY DEPARTMENT Provider Note   CSN: [id number] Arrival date & time: 09/01/20  5003     History Chief Complaint  Patient presents with  . Near Syncope    Brandy Townsend is a 70 y.o. female.  70 year old female presents after developing cute onset of palpitations with associated dizziness prior to arrival.  Patient took her heart rate and was in the 200s.  Denies any prior history of SVT but does have a history of hypothyroidism.  States that symptoms lasted for approximately 30 minutes and resolved on their own.  Did not have any associated chest pain or chest pressure.  No recent history of illness.  Has a mild headache at this time without focal neurological features.  Transported via EMS        Past Medical History:  Diagnosis Date  . Asthma   . CAD (coronary artery disease)   . Graves disease    s/p RAI  . High cholesterol   . Hypertension   . Osteoporosis   . Rheumatoid arthritis ([place])   . Thyroid disease   . Vertigo     Patient Active Problem List   Diagnosis Date Noted  . Left lower quadrant abdominal pain 06/22/2020  . Anxiety 01/08/2020  . Chest pain 01/08/2020  . Pulmonary infiltrate present on computed tomography   . Right kidney stone 07/18/2019  . Nephrolithiasis 07/18/2019  . Rheumatoid arthritis ([place]) 01/14/2017  . Pain in both hands 05/12/2016  . Chronic left shoulder pain 05/12/2016  . Chronic sinusitis of both maxillary sinuses 03/24/2016  . 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 Nonspecific chest pain 05/16/2015  . HTN (hypertension) 05/16/2015  . Hyperthyroidism 05/16/2015  . Prerenal azotemia 05/16/2015    Past Surgical History:  Procedure Laterality Date  . BRONCHIAL WASHINGS  09/12/2019   Procedure: BRONCHIAL WASHINGS;  Surgeon: Brand Males, MD;  Location: [facility];  Service: Endoscopy;;  . INTRAVASCULAR PRESSURE WIRE/FFR STUDY N/A 01/08/2020   Procedure:  INTRAVASCULAR PRESSURE WIRE/FFR STUDY;  Surgeon: Martinique, Peter M, MD;  Location: [place] [facility];  Service: Cardiovascular;  Laterality: N/A;  . LEFT HEART CATH AND CORONARY ANGIOGRAPHY N/A 01/08/2020   Procedure: LEFT HEART CATH AND CORONARY ANGIOGRAPHY;  Surgeon: Martinique, Peter M, MD;  Location: [place] [facility];  Service: Cardiovascular;  Laterality: N/A;  . VIDEO BRONCHOSCOPY N/A 09/12/2019   Procedure: VIDEO BRONCHOSCOPY WITHOUT FLUORO;  Surgeon: Brand Males, MD;  Location: [facility];  Service: Endoscopy;  Laterality: N/A;     OB History    Gravida  3   Para  2   Term      Preterm      AB      Living        SAB      IAB      Ectopic      Multiple      Live Births              Family History  Problem Relation Age of Onset  . Heart disease Father   . Cancer Daughter 32       stage 3 breast     Social History   Tobacco Use  . Smoking status: Former Smoker    Packs/day: 1.00    Years: 20.00    Pack years: 20.00    Types: Cigarettes    Quit date: 08/26/2008    Years since quitting: 12.0  . Smokeless tobacco:  Never Used  . Tobacco comment: social smoker  Vaping Use  . Vaping Use: Never used  Substance Use Topics  . Alcohol use: No  . Drug use: No    Home Medications Prior to Admission medications   Medication Sig Start Date End Date Taking? Authorizing Provider  acetaminophen (TYLENOL) 500 MG tablet Take 500 mg by mouth every 6 (six) hours as needed for moderate pain.    [provider]  albuterol (VENTOLIN HFA) 108 (90 Base) MCG/ACT inhaler Inhale 1-2 puffs into the lungs every 6 (six) hours as needed for wheezing or shortness of breath. 12/10/19   Parrett, Fonnie Mu, NP  aspirin EC 81 MG tablet Take 1 tablet (81 mg total) by mouth daily. Swallow whole. 12/17/19   Burtis Junes, NP  cetirizine (ZYRTEC) 10 MG tablet Take 10 mg by mouth at bedtime as needed for allergies.     [provider]  fluticasone furoate-vilanterol  (BREO ELLIPTA) 100-25 MCG/INH AEPB Inhale 1 puff into the lungs daily. 07/14/20   Brand Males, MD  folic acid (FOLVITE) 1 MG tablet Take 1 mg by mouth daily. Only takes on days she takes methotrexate 11/13/19   [provider]  ibuprofen (ADVIL) 200 MG tablet Take 200 mg by mouth every 6 (six) hours as needed for mild pain (inflammation pain).     [provider]  inFLIXimab-abda (RENFLEXIS IV) Inject 1 ampule into the vein once a week. Every two weeks, then once every six weeks    [provider]  KLOR-CON 10 10 MEQ tablet Take 10 mEq by mouth daily. 08/11/14   [provider]  levothyroxine (SYNTHROID) 50 MCG tablet Take 50 mcg by mouth daily.  08/16/18   [provider]  meclizine (ANTIVERT) 25 MG tablet Take 1 tablet (25 mg total) by mouth 3 (three) times daily as needed for dizziness. 05/14/20   Drenda Freeze, MD  medroxyPROGESTERone (PROVERA) 10 MG tablet Take 30 mg by mouth at bedtime. 06/16/20   [provider]  methotrexate (RHEUMATREX) 2.5 MG tablet Take 15 mg by mouth once a week. Monday evening    [provider]  nitroGLYCERIN (NITROSTAT) 0.4 MG SL tablet Place 1 tablet (0.4 mg total) under the tongue every 5 (five) minutes as needed for chest pain. Patient not taking: Reported on 06/19/2020 05/27/20   Josue Hector, MD  ondansetron (ZOFRAN ODT) 4 MG disintegrating tablet Take 1 tablet (4 mg total) by mouth every 8 (eight) hours as needed for nausea or vomiting. 01/01/20   Horton, Barbette Hair, MD  promethazine (PHENERGAN) 25 MG tablet Take 1 tablet (25 mg total) by mouth every 6 (six) hours as needed for nausea or vomiting. 05/14/20   Drenda Freeze, MD  pyridOXINE (VITAMIN B-6) 100 MG tablet Take 100 mg by mouth daily.    [provider]  rosuvastatin (CRESTOR) 10 MG tablet Take 1 tablet (10 mg total) by mouth daily. 01/20/20 04/19/20  Burtis Junes, NP  torsemide (DEMADEX) 20 MG tablet Take 1 tablet (20 mg  total) by mouth daily. 05/15/19   Burtis Junes, NP  verapamil (CALAN-SR) 240 MG CR tablet TAKE 1 TABLET BY MOUTH EVERY DAY 05/11/20   Burtis Junes, NP  Vitamin D, Ergocalciferol, (DRISDOL) 1.25 MG (50000 UNIT) CAPS capsule Take 50,000 Units by mouth every 7 (seven) days. Monday    [provider]    Allergies    Cat hair extract, Dust mite extract, and Macrobid [nitrofurantoin  monohyd macro]  Review of Systems   Review of Systems  All other systems reviewed and are negative.   Physical Exam Updated Vital Signs BP 105/69 (BP Location: Right Arm)   Pulse 84   Temp 98.9 F (37.2 C) (Oral)   Resp 19   SpO2 100%   Physical Exam Vitals and nursing note reviewed.  Constitutional:      General: She is not in acute distress.    Appearance: Normal appearance. She is well-developed. She is not toxic-appearing.  HENT:     Head: Normocephalic and atraumatic.  Eyes:     General: Lids are normal.     Conjunctiva/sclera: Conjunctivae normal.     Pupils: Pupils are equal, round, and reactive to light.  Neck:     Thyroid: No thyroid mass.     Trachea: No tracheal deviation.  Cardiovascular:     Rate and Rhythm: Normal rate and regular rhythm.     Heart sounds: Normal heart sounds. No murmur heard. No gallop.   Pulmonary:     Effort: Pulmonary effort is normal. No respiratory distress.     Breath sounds: Normal breath sounds. No stridor. No decreased breath sounds, wheezing, rhonchi or rales.  Abdominal:     General: Bowel sounds are normal. There is no distension.     Palpations: Abdomen is soft.     Tenderness: There is no abdominal tenderness. There is no rebound.  Musculoskeletal:        General: No tenderness. Normal range of motion.     Cervical back: Normal range of motion and neck supple.  Skin:    General: Skin is warm and dry.     Findings: No abrasion or rash.  Neurological:     Mental Status: She is alert and oriented to person, place, and time.     GCS:  GCS eye subscore is 4. GCS verbal subscore is 5. GCS motor subscore is 6.     Cranial Nerves: No cranial nerve deficit.     Sensory: No sensory deficit.  Psychiatric:        Speech: Speech normal.        Behavior: Behavior normal.     ED Results / Procedures / Treatments   Labs (all labs ordered are listed, but only abnormal results are displayed) Labs Reviewed  BASIC METABOLIC PANEL  CBC  URINALYSIS, ROUTINE W REFLEX MICROSCOPIC  CBG MONITORING, ED    EKG None ED ECG REPORT   Date: 09/01/2020  Rate: 78  Rhythm: normal sinus rhythm  QRS Axis: normal  Intervals: normal  ST/T Wave abnormalities: normal  Conduction Disutrbances:none  Narrative Interpretation:   Old EKG Reviewed: none available  I have personally reviewed the EKG tracing and agree with the computerized printout as noted.  Radiology No results found.  Procedures Procedures   Medications Ordered in ED Medications - No data to display  ED Course  I have reviewed the triage vital signs and the nursing notes.  Pertinent labs & imaging results that were available during my care of the patient were reviewed by me and considered in my medical decision making (see chart for details).    MDM Rules/Calculators/A&P                          Enzymes negative here x2.  Head CT negative after patient may complain of headache.  Feels better at this time.  No arrhythmias noted here.  Will prescribe as  needed Lopressor if she should have symptoms.  She will follow-up with her doctor Final Clinical Impression(s) / ED Diagnoses Final diagnoses:  None    Rx / DC Orders ED Discharge Orders    None       Lacretia Leigh, MD 09/01/20 1427

## 2020-09-14 DIAGNOSIS — M0579 Rheumatoid arthritis with rheumatoid factor of multiple sites without organ or systems involvement: Secondary | ICD-10-CM | POA: Diagnosis not present

## 2020-09-15 ENCOUNTER — Other Ambulatory Visit: Payer: Self-pay

## 2020-09-15 ENCOUNTER — Ambulatory Visit (INDEPENDENT_AMBULATORY_CARE_PROVIDER_SITE_OTHER): Payer: Medicare Other | Admitting: Internal Medicine

## 2020-09-15 ENCOUNTER — Encounter: Payer: Self-pay | Admitting: Internal Medicine

## 2020-09-15 VITALS — BP 104/60 | HR 64 | Ht 63.0 in | Wt 147.8 lb

## 2020-09-15 DIAGNOSIS — J4540 Moderate persistent asthma, uncomplicated: Secondary | ICD-10-CM

## 2020-09-15 DIAGNOSIS — I2510 Atherosclerotic heart disease of native coronary artery without angina pectoris: Secondary | ICD-10-CM | POA: Diagnosis not present

## 2020-09-15 DIAGNOSIS — Z79899 Other long term (current) drug therapy: Secondary | ICD-10-CM

## 2020-09-15 DIAGNOSIS — I471 Supraventricular tachycardia: Secondary | ICD-10-CM | POA: Diagnosis not present

## 2020-09-15 DIAGNOSIS — Z8616 Personal history of COVID-19: Secondary | ICD-10-CM | POA: Diagnosis not present

## 2020-09-15 DIAGNOSIS — D84821 Immunodeficiency due to drugs: Secondary | ICD-10-CM

## 2020-09-15 NOTE — Addendum Note (Signed)
Addended by: Lorretta Harp on: 09/15/2020 11:40 AM   Modules accepted: Orders

## 2020-09-15 NOTE — Addendum Note (Signed)
Addended by: Lorretta Harp on: 09/15/2020 11:44 AM   Modules accepted: Orders

## 2020-09-15 NOTE — Patient Instructions (Addendum)
ICD-10-CM   1. Moderate persistent asthma without complication  C16.38     2. 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 History of 2019 novel coronavirus disease (COVID-19)  Z86.16     4. SVT (supraventricular tachycardia) ([facility])  I47.1       Noted asthma is under good control  Noted that you are now on Orencia which has a history of respiratory exacerbations and COPD patients but is currently tolerating it well  Noted that in April 2022 you had COVID-19 treated as outpatient  Noted that in June 2022 your supraventricular tachycardia  -current literature does not support Breo as the cause for this  Plan  - Continue Breo scheduled which is less expensive than Dulera - Use albuterol as needed - Check COVID IgG antibody -Continue Orencia and will monitor for respiratory exacerbations -Continue to monitor your heart issue with Dr. Johnsie Cancel  Follow-up - Return in 6 months or sooner if needed -check ACT score and also FeNO at follow-up

## 2020-09-15 NOTE — Progress Notes (Addendum)
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 Wilson Singer, intermittent chest tightness, no wheezing or cough   wheezng started roughly the same year she quit smoking assoc with wt gain x 35lb wt gain typically better p prednisone and better since spring 2017  On asthmanex 2 each am with rare saba needed but then Feb 10 2016 chest tightness and did not try saba > ER 02/21/16 with w/u neg w/u except for a/f levels both max sinuses> Rx Rosen= prednsione > back to baseline at time of ov with no cough or sob or chest tightness and  Not limited by breathing from desired activities including treadmill at 3.5 mph flat x 1.5 miles  Other than assoc of flares with cold/sinus infections >> No obvious patterns in day to day or daytime variability or assoc excess/ purulent sputum or mucus plugs or hemoptysis or cp or chest tightness, subjective wheeze or overt   hb symptoms. No unusual exp hx or h/o childhood pna/ asthma or knowledge of premature birth. 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  No new complaints. 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 She has not had to use her albuterol inhaler at all and has only been using the symbicort 1-2 puffs every am.   symb 80 1-2 each am at most never using the 2 bid max  Not limited by breathing from desired activities   No cough   No obvious day to day or  daytime variability or assoc excess/ purulent sputum or mucus plugs or hemoptysis or cp or chest tightness, subjective wheeze or overt sinus or hb symptoms. No unusual exp hx or h/o childhood pna/ asthma or knowledge of premature birth.  Sleeping ok flat without nocturnal  or early am exacerbation  of respiratory  c/o's or need for noct saba. Also denies any obvious fluctuation of symptoms with weather or environmental changes or other aggravating or alleviating factors except as outlined above   Current Allergies, Complete Past Medical History, Past Surgical History, Family History, and Social History were reviewed in [employer] record.  ROS  The following are not active complaints unless bolded Hoarseness, sore throat, dysphagia, dental problems, itching, sneezing,  nasal congestion or discharge of excess mucus or purulent secretions,  ear ache,   fever, chills, sweats, unintended wt loss or wt gain, classically pleuritic or exertional cp,  orthopnea pnd or leg swelling, presyncope, palpitations, abdominal pain, anorexia, nausea, vomiting, diarrhea  or change in bowel habits or change in bladder habits, change in stools or change in urine, dysuria, hematuria,  rash, arthralgias, visual complaints, headache, numbness, weakness or ataxia or problems with walking or coordination,  change in mood/affect or memory.       OV 08/29/2019  Subjective:  Patient ID: [name], female , DOB: 10-24-1950 , age 70 y.o. , MRN: [id number] , ADDRESS: [place] [address]   08/29/2019 -   Chief Complaint  Patient presents with   Consult    Abnormal CT, hx of asthma. hx of low oxygen level and cough.    Transfer of care from Dr. [name] to Dr. [name].  HPI DEVANEE POMPLUN 70 y.o. -referred for possible abnormalities in the CT scan of the chest in April 2021 in the setting of rheumatoid arthritis 2018 (RF + and CCP > 250 per [name] over phone) and being on [place] and longstanding history of allergic asthma on dulera. 20 pack former smoking   70 year old female.  Former Radio producer.  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  She reports a many year many decade history of allergic asthma which flares up in the fall.  Between episodes she is not on any maintenance treatment.  In the fall when it flares up her previous primary care physician Dr. [name] retired] would give her antibiotics and prednisone one course and this would help it.  However in 2020 when the flareup happened she could not get access to the prednisone and antibiotics because the primary care physician retired and in the setting of [place] it was televisit.  By the end of 2020 she ended up with 2 rounds of antibiotics and prednisone that helped clear things up.  In the interim in the last 4 years she has had a diagnosis of  rheumatoid arthritis.  She was on methotrexate but had raised liver function test.  This seemed to help.  She failed Plaquenil and then was started on leflunomide which she is on currently.   In 2018 she did have spirometry and DLCO on pulmonary function testing that shows hyperinflation and air trapping with reduced DLCO but also normal spirometry.  In the backdrop of this in January 2020 when she underwent her first Covid vaccine.  Then in early February 2020 when she had a second Covid vaccine.  24 hours later she got hypotensive tachycardic and also hypoxemic and ended up in the ER.  I reviewed the  ER notes.  She was observed in the ER for whole day and her symptoms and signs resolved and the hypoxemia also resolved.  She was discharged with a prednisone course.  She says subsequent to that she was short of breath and fatigue for 2 weeks and this also resolved.  She did see cardiology in February 2021.  She was reassured by a normal stress test.  After that she switched primary care physicians to Dr. Reynold Bowen.  She is now established with a new rheumatologist at [place] and is seen [name] the 41 assistant for the first time on August 27, 2019 2 days ago.  She also had a thyroid biopsy which was nondiagnostic.  On the rheumatology visit 2 days ago it was felt her rheumatoid arthritis was active.  Blood has been drawn and work-up is in progress.  Through all this primary care physician did do CT chest without contrast.  I personally visualized this.  There is evidence of tree-in-bud that is mild and subtle.  Therefore she is referred here.  However at this point in time she is asymptomatic from a respiratory standpoint no wheezing no orthopnea no proximal nocturnal dyspnea no dyspnea on exertion no cough.  However in terms of rheumatoid arthritis there is plans to increase her immunomodulation. She has been started on low dose pred now by [name]. Currently labs  pending are CRP, Quant Gold and Hep Panel   D/w [name] later -> she is considering adding DMARD -discussed and she is supportive of getting bronch and rule out opportunistic infection  ROS - per HPI  IMPRESSION: CT chest visualized 1. Very mild, stable tree-in-bud appearing opacities along the anterolateral aspect of the bilateral upper lobes. Sequelae associated with atypical infection cannot be excluded. 2. Predominant stable thyroid goiter.   Aortic Atherosclerosis (ICD10-I70.0).     Electronically Signed   By: Virgina Norfolk M.D.   On: 07/22/2019 22:34    OV 10/17/2019  Subjective:  Patient ID: [name], female , DOB: 1950/06/04 , age 38 y.o. , MRN: [id number] , ADDRESS: [place] [id number]   10/17/2019 -   Chief Complaint  Patient presents with   Follow-up    Pt is here to discuss results of the bronch. Pt states she has been doing good since last visit and denies any complaints.     HPI REINA WILTON 70 y.o. -returns for follow-up.  At this point in time she is on prednisone 20 mg/day.  She has gained weight.  She is got some bruises.  She is not happy about her prednisone.  She denies any respiratory complaints.  Mid June 2020 when she underwent bronchoscopy with lavage.  I reviewed the results.  She is got some mild neutrophilia but organisms such as PCP and TB and fungus are negative.  She really wants to get started on a DMA RD so she can come down or get off prednisone.  I discussed with rheumatology PA [name] and she is of the understanding.  I stated there is no ILD.  They are going to focus on joint treatment.  Patient did indicate that in the fall she is at risk for respiratory exacerbations.  But she lives nearby and is agreeable to a 9-29-monthfollow-up.  She is supposed to get pulmonary function test today but that was not scheduled by our office.  Results for HSHAREENA, NUSZ(MRN [id number] as of 10/17/2019 16:38  Ref.  Range 09/12/2019 08:59  Monocyte-Macrophage-Serous Fluid Latest Ref Range: 50 - 90 % 38 (L)  Other Cells, Fluid Latest Units: % CORRELATE WITH CYTOLOGY.  Fluid Type-FCT Unknown Bronch Lavag  Color, Fluid Latest Ref Range: YELLOW  COLORLESS (A)  Total Nucleated Cell Count, Fluid Latest Ref Range: 0 - 1,000 cu mm 15  Lymphs, Fluid Latest Units: % 4  Eos, Fluid Latest Units: % 2  Appearance, Fluid Latest Ref Range: CLEAR  CLOUDY (A)  Neutrophil Count, Fluid Latest Ref Range: 0 - 25 % 56 (H)   ROS - per HPI   Patient ID: [name], female    DOB: 01-Mar-1951, 70 y.o.   MRN: [id number]  Chief Complaint  Patient presents with   Follow-up    Cough     Referring provider: Reynold Bowen, MD    12/10/2019 Acute OV : Cough  HPI: 70 year old female former smoker followed for abnormal CT chest with pulmonary infiltrates Medical history significant for rheumatoid arthritis    Patient presents for an acute office visit.  She complains of 1 week increased cough,congestion , and wheezing . Mucus is clear , hard to cough anything up.  She has underlying rheumatoid arthritis and is recently had her maintenance regimen changed to methotrexate in infliximab.    She had recent Covid test has been negative.  Her Covid vaccines are up-to-date.  She denies any fever or loss of taste or smell.   Patient is being followed for mild pulmonary infiltrates noted on CT chest.  She is negative for interstitial lung disease.  She underwent bronchoscopy in June 2021 that showed negative cultures and BAL.  Patient is followed by rheumatology and is on methotrexate and Renflexis     TEST/EVENTS :  In 2018 she did have spirometry and DLCO on pulmonary function testing that shows hyperinflation and air trapping with reduced DLCO but also normal spirometry.  CT chest July 22, 2019 showed very mild stable tree-in-bud appearing opacities along the bilateral upper lobes.  Bronchoscopy June 2021 showed cytology  positive for benign bronchial cells and pulmonary macrophages.,  AFB negative, BAL negative, M tubercular goes complex negative, fungal negative PCP negative   OV 01/23/2020  Subjective:  Patient ID: [name], female , DOB: 21-Jun-1950 , age 44 y.o. , MRN: [id number] , ADDRESS: [place] [phone] PCP Reynold Bowen, MD Patient Care Team: Reynold Bowen, MD as PCP - General (Endocrinology) Vania Rea, MD as PCP - OBGYN (Obstetrics and Gynecology) Josue Hector, MD as PCP - Cardiology (Cardiology)  This Provider for this visit: Treatment Team:  Attending Provider: Brand Males, MD    01/23/2020 -   Chief Complaint  Patient presents with   Follow-up    Pt states she has been doing okay since last visit. States her breathing has been doing okay. Still becomes SOB but it happens out of nowhere and she isn't sure why or if it is coming due to her heart.   Rheumatoid arthritis with immunosuppression and mild pulmonary infiltrates.  BAL with neutrophilia blood culture negative-June 2021  Long history of allergic asthma previously on Dulera.  HPI TATELYN VANHECKE 70 y.o. -returns for follow-up.  Since last seeing me she is seen nurse practitioner in September 2021.  At that time she was wheezing.  She was given Depo-Medrol and outpatient Omnicef.  She says within a few days she responded to this.  Since then she feels back to baseline but she says  even at baseline she is short of breath with exertion relieved by rest.  She says is not normal for her.  She recently had a cardiac cath and she reports this is normal.  I reviewed the chart and agree with that.  She is also dealing with her husband has esophageal cancer.  Recently was in the ER because of' false Hyperkalemia according to history.  She is currently on TNF alpha blockade and methotrexate for her rheumatoid arthritis.  She says the joint symptoms have not fully improved.  She is waiting for the third  dose of the TNF alpha blockade before she is expecting to show improvement in her symptoms.  Overall the last few months have been high level of social stress.  She thinks it is slowly settling down.  She feels like things are changed after getting her Covid vaccine.  Nevertheless she understands she is immunosuppressed and she is in need of a booster.  She has some trepidation about getting the booster.  She is willing to have a IgG antibody levels checked as a means of triage her understanding antibody response to previous Covid vaccine.  Today she pulmonary function test in my view shows obstruction with significant bronchodilator response consistent with an asthma pattern.  Documented below.  I reviewed the graph.  Nitric oxide test and asthma control test scores are listed below and shows disease activity   Asthma Control Test ACT Total Score  01/23/2020 18  08/29/2019 24     Lab Results  Component Value Date   NITRICOXIDE 34 01/23/2020         PFT Results Latest Ref Rng & Units 01/23/2020 01/13/2017  FVC-Pre L 2.08 2.43  FVC-Predicted Pre % 69 79  FVC-Post L 2.41 2.46  FVC-Predicted Post % 80 80  Pre FEV1/FVC % % 72 73  Post FEV1/FCV % % 75 76  FEV1-Pre L 1.51 1.77  FEV1-Predicted Pre % 66 75  FEV1-Post L 1.81 1.87  DLCO uncorrected ml/min/mmHg 18.49 16.68  DLCO UNC% % 97 72  DLCO corrected ml/min/mmHg 18.38 17.68  DLCO COR %Predicted % 96 77  DLVA Predicted % 108 83  TLC L 5.38 5.90  TLC % Predicted % 109 120  RV % Predicted % 142 162   Lab Results  Component Value Date   NITRICOXIDE 33 03/23/2016   Results for Dishner, HARGUN SPURLING (MRN [id number]) as of 01/23/2020 10:05  Ref. Range 02/01/2017 20:32 09/02/2018 15:40 05/08/2019 11:00 07/18/2019 10:06 07/19/2019 04:31 07/27/2019 11:05 01/01/2020 06:10 01/08/2020 10:14  Eosinophils Absolute Latest Ref Range: 0.0 - 0.5 K/uL  0.3 0.5    0.3     OV 03/11/2020  Subjective:  Patient ID: [name], female , DOB: 1950-12-05 , age 80  y.o. , MRN: [id number] , ADDRESS: [place] [phone] PCP Reynold Bowen, MD Patient Care Team: Reynold Bowen, MD as PCP - General (Endocrinology) Vania Rea, MD as PCP - OBGYN (Obstetrics and Gynecology) Josue Hector, MD as PCP - Cardiology (Cardiology)  This Provider for this visit: Treatment Team:  Attending Provider: Brand Males, MD    03/11/2020 -   Chief Complaint  Patient presents with   Follow-up    Patient wants to get her covid antibodies checked today since she got your booster. Feels good overall    Rheumatoid arthritis with immunosuppression and mild pulmonary infiltrates.  BAL with neutrophilia blood culture negative-June 2021  Long history of allergic asthma previously on Dulera.  And recommenced  Dulera in October 2021   HPI GERA INBODEN 70 y.o. -returns for follow-up after starting [facility] for asthma.  She says the Ruthe Mannan has worked well.  When she takes a deep breath she does not feel dizzy.  Her ACT control score has improved to 24.  Her main issue is rheumatoid arthritis.  She is now on it TNF alpha blockade.  This is controlling the pain but she feels the dose was suboptimal and ago and increase the dose.  Additionally she has had a Covid booster mRNA vaccine in mid November 2021.  Previously her IgG antibody after the second dose was negative.  She now wants to check IgG level after the third dose.  We did talk about new literature showing the FDA has approved under emergency use authorization AstraZeneca monoclonal antibody with a long half-life of 6 months as preexposure prophylaxis.  There are also emerging clinical trials for patients of had a vaccine who are IgG negative in response.  She is interested in both options  ROS - per HPI  OV 09/15/2020  Subjective:  Patient ID: [name], female , DOB: 12-Jun-1950 , age 65 y.o. , MRN: [id number] , ADDRESS: [place] [phone] PCP Reynold Bowen,  MD Patient Care Team: Reynold Bowen, MD as PCP - General (Endocrinology) Vania Rea, MD as PCP - OBGYN (Obstetrics and Gynecology) Josue Hector, MD as PCP - Cardiology (Cardiology)  This Provider for this visit: Treatment Team:  Attending Provider: Brand Males, MD    09/15/2020 -   Chief Complaint  Patient presents with   Follow-up    Pt states she has been doing okay since last visit. States she had covid July 11, 2020 and did have the infusion.    Rheumatoid arthritis with immunosuppression and mild pulmonary infiltrates.  BAL with neutrophilia blood culture negative-June 2021 Long history of allergic asthma previously on Dulera.  And recommenced Dulera in October 2021. Changed to breo 2022 due to cost  HPI SHANIA BJELLAND 70 y.o. -returns for 24-monthfollow-up.  Previously on DEldred  Now on Breo because of expense reasons.  Dulera was $150 more expensive.  In April 2022 she had COVID-19 and treated as outpatient and successfully recovered.  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  Asthma itself she feels is under control.  No nocturnal awakenings no albuterol rescue use that is significant.  Not much symptoms.  On 09/01/2020 she had a significant episode of tachycardia.  Admitted to the ER.  According to her she had SVT.  Some of the ER records reviewed her labs reviewed.  Cardiologist aware.  No Zio patch monitoring.  Currently feeling well.  She wants a COVID IgG antibody checked    Asthma Control Test ACT Total Score  09/15/2020 18  03/11/2020 24  01/23/2020 18      Lab Results  Component Value Date   NITRICOXIDE 34 01/23/2020        PFT  PFT Results Latest Ref Rng & Units 01/23/2020 01/13/2017  FVC-Pre L 2.08 2.43  FVC-Predicted Pre % 69 79  FVC-Post L 2.41 2.46  FVC-Predicted Post % 80 80  Pre FEV1/FVC % % 72 73  Post FEV1/FCV % % 75 76  FEV1-Pre L 1.51 1.77  FEV1-Predicted  Pre % 66 75  FEV1-Post L 1.81 1.87  DLCO uncorrected ml/min/mmHg 18.49 16.68  DLCO UNC% % 97 72  DLCO corrected ml/min/mmHg 18.38 17.68  DLCO COR %Predicted % 96 77  DLVA Predicted % 108 83  TLC L 5.38 5.90  TLC % Predicted % 109 120  RV % Predicted % 142 162       has a past medical history of Asthma, CAD (coronary artery disease), Graves disease, High cholesterol, Hypertension, Osteoporosis, Rheumatoid arthritis ([place]), Thyroid disease, and Vertigo.   reports that she quit smoking about 12 years ago. Her smoking use included cigarettes. She has a 20.00 pack-year smoking history. She has never used smokeless tobacco.  Past Surgical History:  Procedure Laterality Date   BRONCHIAL WASHINGS  09/12/2019   Procedure: BRONCHIAL WASHINGS;  Surgeon: Brand Males, MD;  Location: [facility];  Service: Endoscopy;;   INTRAVASCULAR PRESSURE WIRE/FFR STUDY N/A 01/08/2020   Procedure: INTRAVASCULAR PRESSURE WIRE/FFR STUDY;  Surgeon: Martinique, Peter M, MD;  Location: [place] [facility];  Service: Cardiovascular;  Laterality: N/A;   LEFT HEART CATH AND CORONARY ANGIOGRAPHY N/A 01/08/2020   Procedure: LEFT HEART CATH AND CORONARY ANGIOGRAPHY;  Surgeon: Martinique, Peter M, MD;  Location: [place] [facility];  Service: Cardiovascular;  Laterality: N/A;   VIDEO BRONCHOSCOPY N/A 09/12/2019   Procedure: VIDEO BRONCHOSCOPY WITHOUT FLUORO;  Surgeon: Brand Males, MD;  Location: [facility];  Service: Endoscopy;  Laterality: N/A;    Allergies  Allergen Reactions   Cat Hair Extract     Other reaction(s): congestion   Dust Mite Extract     Other reaction(s): Unknown   Macrobid [Nitrofurantoin Monohyd Macro] Other (See Comments)    Lowered potassium, caused aches and pains    Immunization History  Administered Date(s) Administered   Fluad Quad(high Dose 65+) 01/20/2020   Influenza Whole 01/04/2017   Influenza, High Dose Seasonal PF 01/03/2017, 12/27/2018   Influenza, Quadrivalent, Recombinant,  Inj, Pf 01/09/2018, 01/11/2019   Influenza,inj,quad, With Preservative 12/26/2016   Influenza-Unspecified 12/26/2013, 12/27/2014, 12/27/2015   PFIZER(Purple Top)SARS-COV-2 Vaccination 04/17/2019, 05/06/2019, 01/31/2020   Pneumococcal Conjugate-13 04/28/2014, 05/26/2014   Zoster Recombinat (Shingrix) 01/14/2019, 04/02/2019   Zoster, Live 05/22/2013, 04/01/2019    Family History  Problem Relation Age of Onset   Heart disease Father    Cancer Daughter 6       stage 3 breast      Current Outpatient Medications:    albuterol (VENTOLIN HFA) 108 (90 Base) MCG/ACT inhaler, Inhale 1-2 puffs into the lungs every 6 (six) hours as needed for wheezing or shortness of breath., Disp: 8 g, Rfl: 2   aspirin EC 81 MG tablet, Take 1 tablet (81 mg total) by mouth daily. Swallow whole., Disp: 90 tablet, Rfl: 3   cetirizine (ZYRTEC) 10 MG tablet, Take 10 mg by mouth at bedtime as needed for allergies. , Disp: , Rfl:    fluticasone furoate-vilanterol (BREO ELLIPTA) 100-25 MCG/INH AEPB, Inhale 1 puff into the lungs daily., Disp: 60 each, Rfl: 5   folic acid (FOLVITE) 1 MG tablet, Take 1 mg by mouth daily. Only takes on days she takes methotrexate, Disp: , Rfl:    KLOR-CON 10 10 MEQ tablet, Take 10 mEq by mouth daily., Disp: , Rfl: 0   levothyroxine (SYNTHROID) 50 MCG tablet, Take 50 mcg by mouth daily. , Disp: , Rfl:    medroxyPROGESTERone (PROVERA) 10 MG tablet, Take 30 mg by mouth at bedtime., Disp: , Rfl:    methotrexate (RHEUMATREX) 2.5 MG tablet, Take 15 mg by mouth once a week. Monday evening, Disp: , Rfl:    pyridOXINE (VITAMIN B-6) 100 MG tablet,  Take 100 mg by mouth daily., Disp: , Rfl:    rosuvastatin (CRESTOR) 10 MG tablet, rosuvastatin 10 mg tablet  TAKE 1 TABLET BY MOUTH EVERY DAY, Disp: , Rfl:    torsemide (DEMADEX) 20 MG tablet, Take 1 tablet (20 mg total) by mouth daily., Disp: 90 tablet, Rfl: 3   verapamil (CALAN-SR) 240 MG CR tablet, TAKE 1 TABLET BY MOUTH EVERY DAY, Disp: 90 tablet, Rfl:  3   Vitamin D, Ergocalciferol, (DRISDOL) 1.25 MG (50000 UNIT) CAPS capsule, Take 50,000 Units by mouth every 7 (seven) days. Monday, Disp: , Rfl:    metoprolol tartrate (LOPRESSOR) 25 MG tablet, Take 0.5 tablets (12.5 mg total) by mouth every 2 (two) hours as needed for up to 1 dose (Take 1 as needed for palpitations, repeat in 2 hours if not better). (Patient not taking: Reported on 09/15/2020), Disp: 10 tablet, Rfl: 0   nitroGLYCERIN (NITROSTAT) 0.4 MG SL tablet, Place 1 tablet (0.4 mg total) under the tongue every 5 (five) minutes as needed for chest pain. (Patient not taking: No sig reported), Disp: 25 tablet, Rfl: 3   rosuvastatin (CRESTOR) 10 MG tablet, Take 1 tablet (10 mg total) by mouth daily., Disp: 90 tablet, Rfl: 3  Current Facility-Administered Medications:    0.9 %  sodium chloride infusion, , Intravenous, PRN, Eileen Stanford, PA-C      Objective:   Vitals:   09/15/20 1047  BP: 104/60  Pulse: 64  SpO2: 99%  Weight: 147 lb 12.8 oz (67 kg)  Height: _0  (1.6 m)    Estimated body mass index is 26.18 kg/m as calculated from the following:   Height as of this encounter: _1  (1.6 m).   Weight as of this encounter: 147 lb 12.8 oz (67 kg).  _2 @  Filed Weights   09/15/20 1047  Weight: 147 lb 12.8 oz (67 kg)     Physical Exam General: No distress. Looks wl Neuro: Alert and Oriented x 3. GCS 15. Speech normal Psych: Pleasant Resp:  Barrel Chest - no.  Wheeze - no, Crackles - no, No overt respiratory distress CVS: Normal heart sounds. Murmurs - no Ext: Stigmata of Connective Tissue Disease - no HEENT: Normal upper airway. PEERL +. No post nasal drip        Assessment:       ICD-10-CM   1. Moderate persistent asthma without complication  X54.00     2. 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 History of 2019 novel coronavirus disease (COVID-19)  Z86.16     4. SVT (supraventricular tachycardia) (HCC)  I47.1           Plan:     Patient Instructions     ICD-10-CM   1. Moderate persistent asthma without complication  Q67.61     2. Immunosuppression due to drug therapy ([place])  D84.821    Z79.899     3. History of 2019 novel coronavirus disease (COVID-19)  Z86.16     4. SVT (supraventricular tachycardia) (HCC)  I47.1       Noted asthma is under good control  Noted that you are now on Orencia which has a history of respiratory exacerbations and COPD patients but is currently tolerating it well  Noted that in April 2022 you had COVID-19 treated as outpatient  Noted that in June 2022 your supraventricular tachycardia  -current literature does not support Breo as the cause for this  Plan  -  Continue Breo scheduled which is less expensive than Dulera - Use albuterol as needed - Check COVID IgG antibody -Continue Orencia and will monitor for respiratory exacerbations -Continue to monitor your heart issue with Dr. Johnsie Cancel  Follow-up - Return in 6 months or sooner if needed -check ACT score and also FeNO at follow-up    SIGNATURE    Dr. Brand Males, M.D., F.C.C.P,  Pulmonary and Critical Care Medicine Staff Physician, [place] Director - Interstitial Lung Disease  Program  [facility] [place], [place], [address]  Pager: [phone], If no answer or between  15:00h - 7:00h: call [phone] Telephone: [phone]  11:37 AM 09/15/2020

## 2020-09-16 LAB — SARS-COV-2 ANTIBODY(IGG)SPIKE,SEMI-QUANTITATIVE: SARS COV1 AB(IGG)SPIKE,SEMI QN: >150 index — ABNORMAL HIGH (ref <1.00)

## 2020-09-18 DIAGNOSIS — M810 Age-related osteoporosis without current pathological fracture: Secondary | ICD-10-CM | POA: Diagnosis not present

## 2020-09-18 DIAGNOSIS — E663 Overweight: Secondary | ICD-10-CM | POA: Diagnosis not present

## 2020-09-18 DIAGNOSIS — R000 Tachycardia, unspecified: Secondary | ICD-10-CM | POA: Diagnosis not present

## 2020-09-18 DIAGNOSIS — N9489 Other specified conditions associated with female genital organs and menstrual cycle: Secondary | ICD-10-CM | POA: Diagnosis not present

## 2020-09-18 DIAGNOSIS — N200 Calculus of kidney: Secondary | ICD-10-CM | POA: Diagnosis not present

## 2020-09-18 DIAGNOSIS — F418 Other specified anxiety disorders: Secondary | ICD-10-CM | POA: Diagnosis not present

## 2020-09-18 DIAGNOSIS — Z6825 Body mass index (BMI) 25.0-25.9, adult: Secondary | ICD-10-CM | POA: Diagnosis not present

## 2020-09-18 DIAGNOSIS — M0579 Rheumatoid arthritis with rheumatoid factor of multiple sites without organ or systems involvement: Secondary | ICD-10-CM | POA: Diagnosis not present

## 2020-09-18 DIAGNOSIS — Z79899 Other long term (current) drug therapy: Secondary | ICD-10-CM | POA: Diagnosis not present

## 2020-09-18 DIAGNOSIS — J302 Other seasonal allergic rhinitis: Secondary | ICD-10-CM | POA: Diagnosis not present

## 2020-09-18 DIAGNOSIS — R9389 Abnormal findings on diagnostic imaging of other specified body structures: Secondary | ICD-10-CM | POA: Diagnosis not present

## 2020-09-20 ENCOUNTER — Telehealth: Payer: Self-pay | Admitting: Internal Medicine

## 2020-09-20 NOTE — Telephone Encounter (Signed)
[name]  Pls let Brandy Townsend know that covid IgG is very high > 150. She can rest 3 and 6 months later to see if levels wane before deciding on booster  Thanks  MR

## 2020-09-22 NOTE — Telephone Encounter (Signed)
Called and spoke with pt letting her know the results of labwork and she verbalized understanding. Nothing further needed. 

## 2020-10-05 DIAGNOSIS — M810 Age-related osteoporosis without current pathological fracture: Secondary | ICD-10-CM | POA: Diagnosis not present

## 2020-10-05 DIAGNOSIS — I10 Essential (primary) hypertension: Secondary | ICD-10-CM | POA: Diagnosis not present

## 2020-10-05 DIAGNOSIS — I700 Atherosclerosis of aorta: Secondary | ICD-10-CM | POA: Diagnosis not present

## 2020-10-05 DIAGNOSIS — Z87442 Personal history of urinary calculi: Secondary | ICD-10-CM | POA: Diagnosis not present

## 2020-10-05 DIAGNOSIS — I2510 Atherosclerotic heart disease of native coronary artery without angina pectoris: Secondary | ICD-10-CM | POA: Diagnosis not present

## 2020-10-05 DIAGNOSIS — J4540 Moderate persistent asthma, uncomplicated: Secondary | ICD-10-CM | POA: Diagnosis not present

## 2020-10-05 DIAGNOSIS — E785 Hyperlipidemia, unspecified: Secondary | ICD-10-CM | POA: Diagnosis not present

## 2020-10-05 DIAGNOSIS — E042 Nontoxic multinodular goiter: Secondary | ICD-10-CM | POA: Diagnosis not present

## 2020-10-05 DIAGNOSIS — E890 Postprocedural hypothyroidism: Secondary | ICD-10-CM | POA: Diagnosis not present

## 2020-10-05 DIAGNOSIS — M060A Rheumatoid arthritis without rheumatoid factor, other specified site: Secondary | ICD-10-CM | POA: Diagnosis not present

## 2020-10-13 DIAGNOSIS — M0579 Rheumatoid arthritis with rheumatoid factor of multiple sites without organ or systems involvement: Secondary | ICD-10-CM | POA: Diagnosis not present

## 2020-11-06 ENCOUNTER — Other Ambulatory Visit: Payer: Self-pay | Admitting: *Deleted

## 2020-11-06 MED ORDER — ROSUVASTATIN CALCIUM 10 MG PO TABS: ORAL_TABLET | ORAL | 0 refills | Status: DC

## 2020-11-12 DIAGNOSIS — M0579 Rheumatoid arthritis with rheumatoid factor of multiple sites without organ or systems involvement: Secondary | ICD-10-CM | POA: Diagnosis not present

## 2020-11-12 DIAGNOSIS — R5383 Other fatigue: Secondary | ICD-10-CM | POA: Diagnosis not present

## 2020-11-12 DIAGNOSIS — Z79899 Other long term (current) drug therapy: Secondary | ICD-10-CM | POA: Diagnosis not present

## 2020-11-12 DIAGNOSIS — Z111 Encounter for screening for respiratory tuberculosis: Secondary | ICD-10-CM | POA: Diagnosis not present

## 2020-11-16 NOTE — Progress Notes (Signed)
CARDIOLOGY OFFICE NOTE  Date:  11/19/2020    Brandy Townsend Date of Birth: 21-Jan-1951 Medical Record [id number]  PCP:  Reynold Bowen, MD  Cardiologist: Johnsie Cancel    History of Present Illness: 70 y.o.  history of palpitations, atypical chest pain with prior normal Myoview, asthma, RA, hx thyroid ablation, remote tobacco use, and +vaccines for Covid.   Recurrent chest pain  Cardiac CT 01/06/20 calcium score 790 97 th percentile CAD RADS 3 possibly obstructive RCA/OM1/D1 FFR CT positive RCA/D1 Cath 01/08/20 [place] Ramus 70% OM` 60% RCA 55% Medical Rx Did not tolerate imdur with headache    Quit smoking 2012 gained a lot of weight Sees [name] for asthma On Dulera and albuterol as needed   Seen in ED 05/14/20 with weakness and vertigo and vomiting after eating some rare hamburger Hydrated and d/c with meclizine and Phenergan   Her husband is recovering from esophageal cancer and still with feeding tube He had a huge setback recently when tube put in backwards and is now on TPN Has two grand children in [place]  8/15 that see her over summer and two here   BP drops with RA infusion  Verapamil cut back to 120 mg daily 05/11/20   COVID 07/14/20 CXR NAD had 3 vaccines and goo antibody titers in June Suspect they were checked due to immunosuppression with Rheumatrex   No angina despite trials and tribulations with husband   Past Medical History:  Diagnosis Date   Asthma    CAD (coronary artery disease)    Graves disease    s/p RAI   High cholesterol    Hypertension    Osteoporosis    Rheumatoid arthritis ([place])    Thyroid disease    Vertigo     Past Surgical History:  Procedure Laterality Date   BRONCHIAL WASHINGS  09/12/2019   Procedure: BRONCHIAL WASHINGS;  Surgeon: Brand Males, MD;  Location: [facility];  Service: Endoscopy;;   INTRAVASCULAR PRESSURE WIRE/FFR STUDY N/A 01/08/2020   Procedure: INTRAVASCULAR PRESSURE WIRE/FFR STUDY;  Surgeon: Martinique, Nayef College  M, MD;  Location: [facility];  Service: Cardiovascular;  Laterality: N/A;   LEFT HEART CATH AND CORONARY ANGIOGRAPHY N/A 01/08/2020   Procedure: LEFT HEART CATH AND CORONARY ANGIOGRAPHY;  Surgeon: Martinique, Danaja Lasota M, MD;  Location: [facility];  Service: Cardiovascular;  Laterality: N/A;   VIDEO BRONCHOSCOPY N/A 09/12/2019   Procedure: VIDEO BRONCHOSCOPY WITHOUT FLUORO;  Surgeon: Brand Males, MD;  Location: [facility];  Service: Endoscopy;  Laterality: N/A;     Medications: Current Meds  Medication Sig   abatacept (ORENCIA) 250 MG injection See admin instructions.   albuterol (VENTOLIN HFA) 108 (90 Base) MCG/ACT inhaler Inhale 1-2 puffs into the lungs every 6 (six) hours as needed for wheezing or shortness of breath.   aspirin EC 81 MG tablet Take 1 tablet (81 mg total) by mouth daily. Swallow whole.   cetirizine (ZYRTEC) 10 MG tablet Take 10 mg by mouth at bedtime as needed for allergies.    fluticasone furoate-vilanterol (BREO ELLIPTA) 100-25 MCG/INH AEPB Inhale 1 puff into the lungs daily.   folic acid (FOLVITE) 1 MG tablet Take 3 mg by mouth daily. Only takes on days she takes methotrexate   KLOR-CON 10 10 MEQ tablet Take 10 mEq by mouth daily.   levothyroxine (SYNTHROID) 50 MCG tablet Take 50 mcg by mouth daily.    medroxyPROGESTERone (PROVERA) 10 MG tablet Take 30 mg by mouth at bedtime.  metoprolol tartrate (LOPRESSOR) 25 MG tablet Take 0.5 tablets (12.5 mg total) by mouth every 2 (two) hours as needed for up to 1 dose (Take 1 as needed for palpitations, repeat in 2 hours if not better).   montelukast (SINGULAIR) 10 MG tablet Take 10 mg by mouth daily.   nitroGLYCERIN (NITROSTAT) 0.4 MG SL tablet Place 1 tablet (0.4 mg total) under the tongue every 5 (five) minutes as needed for chest pain.   predniSONE (DELTASONE) 20 MG tablet Take 20 mg by mouth 2 (two) times daily.   pyridOXINE (VITAMIN B-6) 100 MG tablet Take 100 mg by mouth daily.   torsemide (DEMADEX) 20 MG  tablet Take 1 tablet (20 mg total) by mouth daily.   verapamil (CALAN-SR) 240 MG CR tablet TAKE 1 TABLET BY MOUTH EVERY DAY   Vitamin D, Ergocalciferol, (DRISDOL) 1.25 MG (50000 UNIT) CAPS capsule Take 50,000 Units by mouth every 7 (seven) days. Monday   [DISCONTINUED] rosuvastatin (CRESTOR) 10 MG tablet rosuvastatin 10 mg tablet  TAKE 1 TABLET BY MOUTH EVERY DAY   Current Facility-Administered Medications for the 11/19/20 encounter (Office Visit) with Josue Hector, MD  Medication   0.9 %  sodium chloride infusion     Allergies: Allergies  Allergen Reactions   Cat Hair Extract     Other reaction(s): congestion   Dust Mite Extract     Other reaction(s): Unknown   Macrobid [Nitrofurantoin Monohyd Macro] Other (See Comments)    Lowered potassium, caused aches and pains    Social History: The patient  reports that she quit smoking about 12 years ago. Her smoking use included cigarettes. She has a 20.00 pack-year smoking history. She has never used smokeless tobacco. She reports that she does not drink alcohol and does not use drugs.   Family History: The patient's family history includes Cancer (age of onset: 39) in her daughter; Heart disease in her father. FH + for CAD.   Review of Systems: Please see the history of present illness.   All other systems are reviewed and negative.   Physical Exam: VS:  BP 110/70   Pulse 78   Ht '5\' 3"'$  (1.6 m)   Wt 68 kg   SpO2 98%   BMI 26.57 kg/m  .  BMI Body mass index is 26.57 kg/m.  Wt Readings from Last 3 Encounters:  11/19/20 68 kg  09/15/20 67 kg  07/10/20 65.8 kg   Affect appropriate Healthy:  appears stated age HEENT: normal Neck supple with no adenopathy JVP normal no bruits no thyromegaly Lungs clear with no wheezing and good diaphragmatic motion Heart:  S1/S2 no murmur, no rub, gallop or click PMI normal Abdomen: benighn, BS positve, no tenderness, no AAA no bruit.  No HSM or HJR Distal pulses intact with no  bruits No edema Neuro non-focal Skin warm and dry No muscular weakness .   LABORATORY DATA:  EKG:   2/17 SR ? Limb lead reversal normal ST segments   Lab Results  Component Value Date   WBC 8.0 09/01/2020   HGB 14.8 09/01/2020   HCT 44.5 09/01/2020   PLT 331 09/01/2020   GLUCOSE 94 09/01/2020   CHOL 145 03/16/2020   TRIG 66 03/16/2020   HDL 84 03/16/2020   LDLCALC 48 03/16/2020   ALT 25 05/14/2020   AST 18 05/14/2020   NA 139 09/01/2020   K 4.2 09/01/2020   CL 105 09/01/2020   CREATININE 0.89 09/01/2020   BUN 11 09/01/2020   CO2  26 09/01/2020   TSH 1.549 09/01/2020     BNP (last 3 results) No results for input(s): BNP in the last 8760 hours.  ProBNP (last 3 results) No results for input(s): PROBNP in the last 8760 hours.   Other Studies Reviewed Today:  CARDIAC CATHETERIZATION   Result Date: 01/08/2020  Mid LAD lesion is 20% stenosed.  1st Diag lesion is 40% stenosed.  Ramus lesion is 70% stenosed.  1st Mrg lesion is 60% stenosed.  Prox RCA to Mid RCA lesion is 55% stenosed.  The left ventricular systolic function is normal.  LV end diastolic pressure is normal.  The left ventricular ejection fraction is 55-65% by visual estimate.  1. Nonobstructive CAD. There is modest disease in the mid RCA with normal DFR. Modest disease in the first OM. The ramus branch is tiny. 2. Normal LV function 3. Normal LVEDP. Plan: recommend continued medical therapy. Patient is a candidate for DC today.   CORONARY CT IMPRESSION 12/2019: 1. Calcium score 790 which is 60 th percentile for age and sex and involves all 3 major epicardial vessels   2.  Normal aortic root 2.9 cm   3. CAD RADS 3 possible obstructive CAD tightest lesions appear to be in mid/distal RCA and OM1 Study sent for [facility] CT   Jenkins Rouge     Electronically Signed   By: Eligha Bridegroom  Myoview Study Highlights 04/2019   The left ventricular ejection fraction is hyperdynamic (>65%). Nuclear  stress EF: 77%. Horizontal ST segment depression ST segment depression of 1 mm was noted during stress in the II, III and aVF leads, and returning to baseline after 5-9 minutes of recovery. The study is normal. This is a low risk study. No change from prior study.      ECHO IMPRESSIONS 04/2019   1. Left ventricular ejection fraction, by estimation, is 55 to 60%. The  left ventricle has normal function. The left ventricle has no regional  wall motion abnormalities. Left ventricular diastolic parameters were  normal.   2. Right ventricular systolic function is normal. The right ventricular  size is normal. There is normal pulmonary artery systolic pressure. The  estimated right ventricular systolic pressure is [id number] mmHg.   3. The mitral valve is grossly normal. No evidence of mitral valve  regurgitation.   4. The aortic valve is tricuspid. Aortic valve regurgitation is not  visualized. No aortic stenosis is present.   5. The inferior vena cava is normal in size with greater than 50%  respiratory variability, suggesting right atrial pressure of 3 mmHg.      ASSESSMENT & PLAN:     1. CAD - Cath 01/08/20  following abnormal CT and in the setting of having chest pain/SOB - to manage medically. She is on statin Did not tolerate nitrates  Fatigue and significant asthma not on beta blocker continue ASA and verapamil   2. HTN - seems to be running lower decrease verapamil to 120 mg daily .   3. HLD - on statin tolerating 10 mg Crestor LDL 48   4. RA - followed by Rheumatology on orencia now Folic acid added to help with hair thinning   5. Pulmonary : f/u [name] asthma, COPD Gold 2    6. Situational stress - not on Rx f/u primary   7. Thyroid:  On synthroid TSH normal   8. COVID:  07/10/20 has had 3 vaccines good antibody titers 09/15/20 improved CXR with NAD     Signed:  Jenkins Rouge, MD  11/19/2020 9:22 AM  [facility] [address] [place], [place]  [address] Phone: [phone] Fax: [phone]

## 2020-11-17 DIAGNOSIS — H7291 Unspecified perforation of tympanic membrane, right ear: Secondary | ICD-10-CM | POA: Diagnosis not present

## 2020-11-17 DIAGNOSIS — J302 Other seasonal allergic rhinitis: Secondary | ICD-10-CM | POA: Diagnosis not present

## 2020-11-19 ENCOUNTER — Encounter: Payer: Self-pay | Admitting: Cardiovascular Disease

## 2020-11-19 ENCOUNTER — Ambulatory Visit (INDEPENDENT_AMBULATORY_CARE_PROVIDER_SITE_OTHER): Payer: Medicare Other | Admitting: Cardiovascular Disease

## 2020-11-19 ENCOUNTER — Other Ambulatory Visit: Payer: Self-pay

## 2020-11-19 VITALS — BP 110/70 | HR 78 | Ht 63.0 in | Wt 150.0 lb

## 2020-11-19 DIAGNOSIS — M05742 Rheumatoid arthritis with rheumatoid factor of left hand without organ or systems involvement: Secondary | ICD-10-CM

## 2020-11-19 DIAGNOSIS — I2510 Atherosclerotic heart disease of native coronary artery without angina pectoris: Secondary | ICD-10-CM | POA: Diagnosis not present

## 2020-11-19 DIAGNOSIS — I10 Essential (primary) hypertension: Secondary | ICD-10-CM

## 2020-11-19 DIAGNOSIS — M05741 Rheumatoid arthritis with rheumatoid factor of right hand without organ or systems involvement: Secondary | ICD-10-CM | POA: Diagnosis not present

## 2020-11-19 DIAGNOSIS — E782 Mixed hyperlipidemia: Secondary | ICD-10-CM | POA: Diagnosis not present

## 2020-11-19 NOTE — Patient Instructions (Addendum)
Medication Instructions:  *If you need a refill on your cardiac medications before your next appointment, please call your pharmacy*  Lab Work: If you have labs (blood work) drawn today and your tests are completely normal, you will receive your results only by: MyChart Message (if you have MyChart) OR A paper copy in the mail If you have any lab test that is abnormal or we need to change your treatment, we will call you to review the results.  Testing/Procedures: None ordered today.  Follow-Up: At [facility], you and your health needs are our priority.  As part of our continuing mission to provide you with exceptional heart care, we have created designated Provider Care Teams.  These Care Teams include your primary Cardiologist (physician) and Advanced Practice Providers (APPs -  Physician Assistants and Nurse Practitioners) who all work together to provide you with the care you need, when you need it.  We recommend signing up for the patient portal called "MyChart".  Sign up information is provided on this After Visit Summary.  MyChart is used to connect with patients for Virtual Visits (Telemedicine).  Patients are able to view lab/test results, encounter notes, upcoming appointments, etc.  Non-urgent messages can be sent to your provider as well.   To learn more about what you can do with MyChart, go to [email or web address].    Your next appointment:   12 month(s)  The format for your next appointment:   In Person  Provider:   You may see Peter Nishan, MD or one of the following Advanced Practice Providers on your designated Care Team:   Laura Ingold, NP  

## 2020-12-07 ENCOUNTER — Ambulatory Visit: Payer: Medicare Other | Admitting: Cardiovascular Disease

## 2020-12-11 DIAGNOSIS — M0579 Rheumatoid arthritis with rheumatoid factor of multiple sites without organ or systems involvement: Secondary | ICD-10-CM | POA: Diagnosis not present

## 2020-12-31 ENCOUNTER — Other Ambulatory Visit: Payer: Self-pay

## 2020-12-31 DIAGNOSIS — Z6826 Body mass index (BMI) 26.0-26.9, adult: Secondary | ICD-10-CM | POA: Diagnosis not present

## 2020-12-31 DIAGNOSIS — R9389 Abnormal findings on diagnostic imaging of other specified body structures: Secondary | ICD-10-CM | POA: Diagnosis not present

## 2020-12-31 DIAGNOSIS — Z79899 Other long term (current) drug therapy: Secondary | ICD-10-CM | POA: Diagnosis not present

## 2020-12-31 DIAGNOSIS — M0579 Rheumatoid arthritis with rheumatoid factor of multiple sites without organ or systems involvement: Secondary | ICD-10-CM | POA: Diagnosis not present

## 2020-12-31 DIAGNOSIS — E663 Overweight: Secondary | ICD-10-CM | POA: Diagnosis not present

## 2020-12-31 DIAGNOSIS — M810 Age-related osteoporosis without current pathological fracture: Secondary | ICD-10-CM | POA: Diagnosis not present

## 2020-12-31 MED ORDER — ROSUVASTATIN CALCIUM 10 MG PO TABS: 10.0000 mg | ORAL_TABLET | Freq: Every day | ORAL | 3 refills | Status: DC

## 2021-01-04 ENCOUNTER — Other Ambulatory Visit: Payer: Self-pay | Admitting: Internal Medicine

## 2021-01-04 DIAGNOSIS — H43811 Vitreous degeneration, right eye: Secondary | ICD-10-CM | POA: Diagnosis not present

## 2021-01-08 DIAGNOSIS — Z23 Encounter for immunization: Secondary | ICD-10-CM | POA: Diagnosis not present

## 2021-01-11 DIAGNOSIS — M0579 Rheumatoid arthritis with rheumatoid factor of multiple sites without organ or systems involvement: Secondary | ICD-10-CM | POA: Diagnosis not present

## 2021-01-14 DIAGNOSIS — E039 Hypothyroidism, unspecified: Secondary | ICD-10-CM | POA: Diagnosis not present

## 2021-01-19 DIAGNOSIS — J329 Chronic sinusitis, unspecified: Secondary | ICD-10-CM | POA: Diagnosis not present

## 2021-01-19 DIAGNOSIS — H7291 Unspecified perforation of tympanic membrane, right ear: Secondary | ICD-10-CM | POA: Diagnosis not present

## 2021-01-19 DIAGNOSIS — J302 Other seasonal allergic rhinitis: Secondary | ICD-10-CM | POA: Diagnosis not present

## 2021-01-19 DIAGNOSIS — H9113 Presbycusis, bilateral: Secondary | ICD-10-CM | POA: Diagnosis not present

## 2021-01-19 DIAGNOSIS — J3489 Other specified disorders of nose and nasal sinuses: Secondary | ICD-10-CM | POA: Diagnosis not present

## 2021-01-19 DIAGNOSIS — J324 Chronic pansinusitis: Secondary | ICD-10-CM | POA: Diagnosis not present

## 2021-01-19 DIAGNOSIS — J342 Deviated nasal septum: Secondary | ICD-10-CM | POA: Diagnosis not present

## 2021-01-20 DIAGNOSIS — H43811 Vitreous degeneration, right eye: Secondary | ICD-10-CM | POA: Diagnosis not present

## 2021-01-22 ENCOUNTER — Ambulatory Visit (INDEPENDENT_AMBULATORY_CARE_PROVIDER_SITE_OTHER): Payer: Medicare Other

## 2021-01-22 ENCOUNTER — Other Ambulatory Visit: Payer: Self-pay

## 2021-01-22 DIAGNOSIS — Z23 Encounter for immunization: Secondary | ICD-10-CM | POA: Diagnosis not present

## 2021-01-27 DIAGNOSIS — M0579 Rheumatoid arthritis with rheumatoid factor of multiple sites without organ or systems involvement: Secondary | ICD-10-CM | POA: Diagnosis not present

## 2021-02-01 DIAGNOSIS — E663 Overweight: Secondary | ICD-10-CM | POA: Diagnosis not present

## 2021-02-01 DIAGNOSIS — M0579 Rheumatoid arthritis with rheumatoid factor of multiple sites without organ or systems involvement: Secondary | ICD-10-CM | POA: Diagnosis not present

## 2021-02-01 DIAGNOSIS — R9389 Abnormal findings on diagnostic imaging of other specified body structures: Secondary | ICD-10-CM | POA: Diagnosis not present

## 2021-02-01 DIAGNOSIS — M810 Age-related osteoporosis without current pathological fracture: Secondary | ICD-10-CM | POA: Diagnosis not present

## 2021-02-01 DIAGNOSIS — Z79899 Other long term (current) drug therapy: Secondary | ICD-10-CM | POA: Diagnosis not present

## 2021-02-01 DIAGNOSIS — Z6826 Body mass index (BMI) 26.0-26.9, adult: Secondary | ICD-10-CM | POA: Diagnosis not present

## 2021-02-08 DIAGNOSIS — M0579 Rheumatoid arthritis with rheumatoid factor of multiple sites without organ or systems involvement: Secondary | ICD-10-CM | POA: Diagnosis not present

## 2021-02-08 DIAGNOSIS — Z79899 Other long term (current) drug therapy: Secondary | ICD-10-CM | POA: Diagnosis not present

## 2021-03-03 DIAGNOSIS — J4540 Moderate persistent asthma, uncomplicated: Secondary | ICD-10-CM | POA: Diagnosis not present

## 2021-03-03 DIAGNOSIS — J301 Allergic rhinitis due to pollen: Secondary | ICD-10-CM | POA: Diagnosis not present

## 2021-03-03 DIAGNOSIS — J3081 Allergic rhinitis due to animal (cat) (dog) hair and dander: Secondary | ICD-10-CM | POA: Diagnosis not present

## 2021-03-03 DIAGNOSIS — J3089 Other allergic rhinitis: Secondary | ICD-10-CM | POA: Diagnosis not present

## 2021-03-04 ENCOUNTER — Ambulatory Visit (INDEPENDENT_AMBULATORY_CARE_PROVIDER_SITE_OTHER): Payer: Medicare Other | Admitting: Internal Medicine

## 2021-03-04 ENCOUNTER — Encounter: Payer: Self-pay | Admitting: Internal Medicine

## 2021-03-04 ENCOUNTER — Other Ambulatory Visit: Payer: Self-pay

## 2021-03-04 VITALS — BP 112/68 | HR 79 | Ht 63.0 in | Wt 153.0 lb

## 2021-03-04 DIAGNOSIS — I2510 Atherosclerotic heart disease of native coronary artery without angina pectoris: Secondary | ICD-10-CM | POA: Diagnosis not present

## 2021-03-04 DIAGNOSIS — J4540 Moderate persistent asthma, uncomplicated: Secondary | ICD-10-CM | POA: Diagnosis not present

## 2021-03-04 LAB — POCT EXHALED NITRIC OXIDE: FeNO level (ppb): 55

## 2021-03-04 MED ORDER — ALBUTEROL SULFATE HFA 108 (90 BASE) MCG/ACT IN AERS: 1.0000 | INHALATION_SPRAY | Freq: Four times a day (QID) | RESPIRATORY_TRACT | 2 refills | Status: DC | PRN

## 2021-03-04 NOTE — Progress Notes (Signed)
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 Wilson Townsend, intermittent chest tightness, no wheezing or cough   wheezng started roughly the same year she quit smoking assoc with wt gain x 35lb wt gain typically better p prednisone and better since spring 2017  On asthmanex 2 each am with rare saba needed but then Feb 10 2016 chest tightness and did not try saba > ER 02/21/16 with w/u neg w/u except for a/f levels both max sinuses> Rx Brandy Townsend= prednsione > back to baseline at time of ov with no cough or sob or chest tightness and  Not limited by breathing from desired activities including treadmill at 3.5 mph flat x 1.5 miles  Other than assoc of flares with cold/sinus infections >> No obvious patterns in day to day or daytime variability or assoc excess/ purulent sputum or mucus plugs or hemoptysis or cp or chest tightness, subjective wheeze or overt   hb symptoms. No unusual exp hx or h/o childhood pna/ asthma or knowledge of premature birth. 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  No new complaints. 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 She has not had to use her albuterol inhaler at all and has only been using the symbicort 1-2 puffs every am.   symb 80 1-2 each am at most never using the 2 bid max  Not limited by breathing from desired activities   No cough   No obvious day to day or  daytime variability or assoc excess/ purulent sputum or mucus plugs or hemoptysis or cp or chest tightness, subjective wheeze or overt sinus or hb symptoms. No unusual exp hx or h/o childhood pna/ asthma or knowledge of premature birth.  Sleeping ok flat without nocturnal  or early am exacerbation  of respiratory  c/o's or need for noct saba. Also denies any obvious fluctuation of symptoms with weather or environmental changes or other aggravating or alleviating factors except as outlined above   Current Allergies, Complete Past Medical History, Past Surgical History, Family History, and Social History were reviewed in [employer] record.  ROS  The following are not active complaints unless bolded Hoarseness, sore throat, dysphagia, dental problems, itching, sneezing,  nasal congestion or discharge of excess mucus or purulent secretions,  ear ache,   fever, chills, sweats, unintended wt loss or wt gain, classically pleuritic or exertional cp,  orthopnea pnd or leg swelling, presyncope, palpitations, abdominal pain, anorexia, nausea, vomiting, diarrhea  or change in bowel habits or change in bladder habits, change in stools or change in urine, dysuria, hematuria,  rash, arthralgias, visual complaints, headache, numbness, weakness or ataxia or problems with walking or coordination,  change in mood/affect or memory.       OV 08/29/2019  Subjective:  Patient ID: Brandy Townsend, female , DOB: 1950-06-02 , age 40 y.o. , MRN: [id number] , ADDRESS: [place] [address]   08/29/2019 -   Chief Complaint  Patient presents with   Consult    Abnormal CT, hx of asthma. hx of low oxygen level and cough.    Transfer of care from Dr. Christinia Townsend to Dr. Chase Townsend.  HPI Brandy Townsend 70 y.o. -referred for possible abnormalities in the CT scan of the chest in April 2021 in the setting of rheumatoid arthritis 2018 (RF + and CCP > 250 per Brandy Townsend over phone) and being on [place] and longstanding history of allergic asthma on dulera. 20 pack former smoking   70 year old female.  Former Radio producer.  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  She reports a many year many decade history of allergic asthma which flares up in the fall.  Between episodes she is not on any maintenance treatment.  In the fall when it flares up her previous primary care physician Dr. Wynetta Townsend retired] would give her antibiotics and prednisone one course and this would help it.  However in 2020 when the flareup happened she could not get access to the prednisone and antibiotics because the primary care physician retired and in the setting of [place] it was televisit.  By the end of 2020 she ended up with 2 rounds of antibiotics and prednisone that helped clear things up.  In the interim in the last 4 years she has had a diagnosis of  rheumatoid arthritis.  She was on methotrexate but had raised liver function test.  This seemed to help.  She failed Plaquenil and then was started on leflunomide which she is on currently.   In 2018 she did have spirometry and DLCO on pulmonary function testing that shows hyperinflation and air trapping with reduced DLCO but also normal spirometry.  In the backdrop of this in January 2020 when she underwent her first Covid vaccine.  Then in early February 2020 when she had a second Covid vaccine.  24 hours later she got hypotensive tachycardic and also hypoxemic and ended up in the ER.  I reviewed the  ER notes.  She was observed in the ER for whole day and her symptoms and signs resolved and the hypoxemia also resolved.  She was discharged with a prednisone course.  She says subsequent to that she was short of breath and fatigue for 2 weeks and this also resolved.  She did see cardiology in February 2021.  She was reassured by a normal stress test.  After that she switched primary care physicians to Dr. Reynold Bowen.  She is now established with a new rheumatologist at [facility] and is seen Brandy Townsend the 67 assistant for the first time on August 27, 2019 2 days ago.  She also had a thyroid biopsy which was nondiagnostic.  On the rheumatology visit 2 days ago it was felt her rheumatoid arthritis was active.  Blood has been drawn and work-up is in progress.  Through all this primary care physician did do CT chest without contrast.  I personally visualized this.  There is evidence of tree-in-bud that is mild and subtle.  Therefore she is referred here.  However at this point in time she is asymptomatic from a respiratory standpoint no wheezing no orthopnea no proximal nocturnal dyspnea no dyspnea on exertion no cough.  However in terms of rheumatoid arthritis there is plans to increase her immunomodulation. She has been started on low dose pred now by Brandy Townsend. Currently labs  pending are CRP, Quant Gold and Hep Panel   D/w Brandy Townsend later -> she is considering adding DMARD -discussed and she is supportive of getting bronch and rule out opportunistic infection  ROS - per HPI  IMPRESSION: CT chest visualized 1. Very mild, stable tree-in-bud appearing opacities along the anterolateral aspect of the bilateral upper lobes. Sequelae associated with atypical infection cannot be excluded. 2. Predominant stable thyroid goiter.   Aortic Atherosclerosis (ICD10-I70.0).     Electronically Signed   By: Virgina Norfolk M.D.   On: 07/22/2019 22:34    OV 10/17/2019  Subjective:  Patient ID: Brandy Townsend, female , DOB: September 19, 1950 , age 43 y.o. , MRN: [id number] , ADDRESS: [place] [id number]   10/17/2019 -   Chief Complaint  Patient presents with   Follow-up    Pt is here to discuss results of the bronch. Pt states she has been doing good since last visit and denies any complaints.     HPI Brandy Townsend 70 y.o. -returns for follow-up.  At this point in time she is on prednisone 20 mg/day.  She has gained weight.  She is got some bruises.  She is not happy about her prednisone.  She denies any respiratory complaints.  Mid June 2020 when she underwent bronchoscopy with lavage.  I reviewed the results.  She is got some mild neutrophilia but organisms such as PCP and TB and fungus are negative.  She really wants to get started on a DMA RD so she can come down or get off prednisone.  I discussed with rheumatology PA Brandy Townsend and she is of the understanding.  I stated there is no ILD.  They are going to focus on joint treatment.  Patient did indicate that in the fall she is at risk for respiratory exacerbations.  But she lives nearby and is agreeable to a 9-47-monthfollow-up.  She is supposed to get pulmonary function test today but that was not scheduled by our office.  Results for Brandy, Townsend(MRN [id number] as of 10/17/2019 16:38  Ref.  Range 09/12/2019 08:59  Monocyte-Macrophage-Serous Fluid Latest Ref Range: 50 - 90 % 38 (L)  Other Cells, Fluid Latest Units: % CORRELATE WITH CYTOLOGY.  Fluid Type-FCT Unknown Bronch Lavag  Color, Fluid Latest Ref Range: YELLOW  COLORLESS (A)  Total Nucleated Cell Count, Fluid Latest Ref Range: 0 - 1,000 cu mm 15  Lymphs, Fluid Latest Units: % 4  Eos, Fluid Latest Units: % 2  Appearance, Fluid Latest Ref Range: CLEAR  CLOUDY (A)  Neutrophil Count, Fluid Latest Ref Range: 0 - 25 % 56 (H)   ROS - per HPI   Patient ID: Brandy Townsend, female    DOB: Feb 28, 1951, 70 y.o.   MRN: [id number]  Chief Complaint  Patient presents with   Follow-up    Cough     Referring provider: Reynold Bowen, MD    12/10/2019 Acute OV : Cough  HPI: 70 year old female former smoker followed for abnormal CT chest with pulmonary infiltrates Medical history significant for rheumatoid arthritis    Patient presents for an acute office visit.  She complains of 1 week increased cough,congestion , and wheezing . Mucus is clear , hard to cough anything up.  She has underlying rheumatoid arthritis and is recently had her maintenance regimen changed to methotrexate in infliximab.    She had recent Covid test has been negative.  Her Covid vaccines are up-to-date.  She denies any fever or loss of taste or smell.   Patient is being followed for mild pulmonary infiltrates noted on CT chest.  She is negative for interstitial lung disease.  She underwent bronchoscopy in June 2021 that showed negative cultures and BAL.  Patient is followed by rheumatology and is on methotrexate and Renflexis     TEST/EVENTS :  In 2018 she did have spirometry and DLCO on pulmonary function testing that shows hyperinflation and air trapping with reduced DLCO but also normal spirometry.  CT chest July 22, 2019 showed very mild stable tree-in-bud appearing opacities along the bilateral upper lobes.  Bronchoscopy June 2021 showed cytology  positive for benign bronchial cells and pulmonary macrophages.,  AFB negative, BAL negative, M tubercular goes complex negative, fungal negative PCP negative   OV 01/23/2020  Subjective:  Patient ID: Brandy Townsend, female , DOB: Oct 12, 1950 , age 57 y.o. , MRN: [id number] , ADDRESS: [place] [id number] PCP Reynold Bowen, MD Patient Care Team: Reynold Bowen, MD as PCP - General (Endocrinology) Vania Rea, MD as PCP - OBGYN (Obstetrics and Gynecology) Josue Hector, MD as PCP - Cardiology (Cardiology)  This Provider for this visit: Treatment Team:  Attending Provider: Brand Males, MD    01/23/2020 -   Chief Complaint  Patient presents with   Follow-up    Pt states she has been doing okay since last visit. States her breathing has been doing okay. Still becomes SOB but it happens out of nowhere and she isn't sure why or if it is coming due to her heart.   Rheumatoid arthritis with immunosuppression and mild pulmonary infiltrates.  BAL with neutrophilia blood culture negative-June 2021  Long history of allergic asthma previously on Dulera.  HPI Brandy Townsend 70 y.o. -returns for follow-up.  Since last seeing me she is seen nurse practitioner in September 2021.  At that time she was wheezing.  She was given Depo-Medrol and outpatient Omnicef.  She says within a few days she responded to this.  Since then she feels back to baseline but she says  even at baseline she is short of breath with exertion relieved by rest.  She says is not normal for her.  She recently had a cardiac cath and she reports this is normal.  I reviewed the chart and agree with that.  She is also dealing with her husband has esophageal cancer.  Recently was in the ER because of' false Hyperkalemia according to history.  She is currently on TNF alpha blockade and methotrexate for her rheumatoid arthritis.  She says the joint symptoms have not fully improved.  She is waiting for the third  dose of the TNF alpha blockade before she is expecting to show improvement in her symptoms.  Overall the last few months have been high level of social stress.  She thinks it is slowly settling down.  She feels like things are changed after getting her Covid vaccine.  Nevertheless she understands she is immunosuppressed and she is in need of a booster.  She has some trepidation about getting the booster.  She is willing to have a IgG antibody levels checked as a means of triage her understanding antibody response to previous Covid vaccine.  Today she pulmonary function test in my view shows obstruction with significant bronchodilator response consistent with an asthma pattern.  Documented below.  I reviewed the graph.  Nitric oxide test and asthma control test scores are listed below and shows disease activity   Asthma Control Test ACT Total Score  01/23/2020 18  08/29/2019 24     Lab Results  Component Value Date   NITRICOXIDE 34 01/23/2020      OV 03/04/2021  Subjective:  Patient ID: Brandy Townsend, female , DOB: 09/06/1950 , age 70 y.o. , MRN: [id number] , ADDRESS: [place] [id number] PCP Reynold Bowen, MD Patient Care Team: Reynold Bowen, MD as PCP - General (Endocrinology) Vania Rea, MD as PCP - OBGYN (Obstetrics and Gynecology) Josue Hector, MD as PCP - Cardiology (Cardiology)  This Provider for this visit: Treatment Team:  Attending Provider: Brand Males, MD    03/04/2021 -   Chief Complaint  Patient presents with   Follow-up    No new concerns     HPI Brandy Townsend 70 y.o. -returns for follow-up.  She tells me overall her asthma is doing well.  In September/October 2022 Dr. Arville Care added Singulair.  She was referred to allergist.  Work-up there has been reassuring.  She would not plan to go back to allergy clinic again.  She saw Dr. Donneta Romberg.  She feels the [facility] and Singulair have helped her.  She wants a refill on her albuterol.   Her ACT control score shows improvement.  However her exhaled nitric oxide score is 55 and is borderline high.  However she is not feeling it.  Of note she had a fourth COVID mRNA booster vaccine against delta and oh micron.  However 2 days after that she got significant side effects of joint flare and like fatigue.  She does not want to do the mRNA vaccine again.  This is second time she has had significant side effects.  She needed prednisone this time.  After the second shot she ended up in the ER with tachycardia.  I have advised her that mRNA vaccine should be listed as allergy.  She is agreeable.  Asthma Control Test ACT Total Score  03/04/2021 24  09/15/2020 18  03/11/2020 24     Lab Results  Component Value Date  NITRICOXIDE 34 01/23/2020     CT Chest data  No results found.    PFT  PFT Results Latest Ref Rng & Units 01/23/2020 01/13/2017  FVC-Pre L 2.08 2.43  FVC-Predicted Pre % 69 79  FVC-Post L 2.41 2.46  FVC-Predicted Post % 80 80  Pre FEV1/FVC % % 72 73  Post FEV1/FCV % % 75 76  FEV1-Pre L 1.51 1.77  FEV1-Predicted Pre % 66 75  FEV1-Post L 1.81 1.87  DLCO uncorrected ml/min/mmHg 18.49 16.68  DLCO UNC% % 97 72  DLCO corrected ml/min/mmHg 18.38 17.68  DLCO COR %Predicted % 96 77  DLVA Predicted % 108 83  TLC L 5.38 5.90  TLC % Predicted % 109 120  RV % Predicted % 142 162       has a past medical history of Asthma, CAD (coronary artery disease), Graves disease, High cholesterol, Hypertension, Osteoporosis, Rheumatoid arthritis ([place]), Thyroid disease, and Vertigo.   reports that she quit smoking about 12 years ago. Her smoking use included cigarettes. She has a 20.00 pack-year smoking history. She has never used smokeless tobacco.  Past Surgical History:  Procedure Laterality Date   BRONCHIAL WASHINGS  09/12/2019   Procedure: BRONCHIAL WASHINGS;  Surgeon: Brand Males, MD;  Location: [facility];  Service: Endoscopy;;   INTRAVASCULAR PRESSURE  WIRE/FFR STUDY N/A 01/08/2020   Procedure: INTRAVASCULAR PRESSURE WIRE/FFR STUDY;  Surgeon: Martinique, Peter M, MD;  Location: [facility];  Service: Cardiovascular;  Laterality: N/A;   LEFT HEART CATH AND CORONARY ANGIOGRAPHY N/A 01/08/2020   Procedure: LEFT HEART CATH AND CORONARY ANGIOGRAPHY;  Surgeon: Martinique, Peter M, MD;  Location: [facility];  Service: Cardiovascular;  Laterality: N/A;   VIDEO BRONCHOSCOPY N/A 09/12/2019   Procedure: VIDEO BRONCHOSCOPY WITHOUT FLUORO;  Surgeon: Brand Males, MD;  Location: [facility];  Service: Endoscopy;  Laterality: N/A;    Allergies  Allergen Reactions   Cat Hair Extract     Other reaction(s): congestion   Dust Mite Extract     Other reaction(s): Unknown   Macrobid [Nitrofurantoin Monohyd Macro] Other (See Comments)    Lowered potassium, caused aches and pains    Immunization History  Administered Date(s) Administered   Fluad Quad(high Dose 65+) 01/20/2020   Influenza Whole 01/04/2017   Influenza, High Dose Seasonal PF 01/03/2017, 12/27/2018, 03/03/2021   Influenza, Quadrivalent, Recombinant, Inj, Pf 01/09/2018, 01/11/2019   Influenza,inj,quad, With Preservative 12/26/2016   Influenza-Unspecified 12/26/2013, 12/27/2014, 12/27/2015   PFIZER(Purple Top)SARS-COV-2 Vaccination 04/17/2019, 05/06/2019, 01/31/2020   Pfizer Covid-19 Vaccine Bivalent Booster 5yr & up 01/22/2021   Pneumococcal Conjugate-13 04/28/2014, 05/26/2014   Pneumococcal Polysaccharide-23 03/03/2021   Zoster Recombinat (Shingrix) 01/14/2019, 04/02/2019   Zoster, Live 05/22/2013, 04/01/2019    Family History  Problem Relation Age of Onset   Heart disease Father    Cancer Daughter 370      stage 3 breast      Current Outpatient Medications:    abatacept (ORENCIA) 250 MG injection, See admin instructions., Disp: , Rfl:    albuterol (VENTOLIN HFA) 108 (90 Base) MCG/ACT inhaler, Inhale 1-2 puffs into the lungs every 6 (six) hours as needed for wheezing  or shortness of breath., Disp: 8 g, Rfl: 2   aspirin EC 81 MG tablet, Take 1 tablet (81 mg total) by mouth daily. Swallow whole., Disp: 90 tablet, Rfl: 3   BREO ELLIPTA 100-25 MCG/INH AEPB, TAKE 1 PUFF BY MOUTH EVERY DAY, Disp: 60 each, Rfl: 5   cetirizine (ZYRTEC) 10 MG tablet,  Take 10 mg by mouth at bedtime as needed for allergies. , Disp: , Rfl:    folic acid (FOLVITE) 1 MG tablet, Take 3 mg by mouth daily. Only takes on days she takes methotrexate, Disp: , Rfl:    KLOR-CON 10 10 MEQ tablet, Take 10 mEq by mouth daily., Disp: , Rfl: 0   levothyroxine (SYNTHROID) 50 MCG tablet, Take 50 mcg by mouth daily. , Disp: , Rfl:    medroxyPROGESTERone (PROVERA) 10 MG tablet, Take 30 mg by mouth at bedtime., Disp: , Rfl:    montelukast (SINGULAIR) 10 MG tablet, Take 10 mg by mouth daily., Disp: , Rfl:    nitroGLYCERIN (NITROSTAT) 0.4 MG SL tablet, Place 1 tablet (0.4 mg total) under the tongue every 5 (five) minutes as needed for chest pain., Disp: 25 tablet, Rfl: 3   pyridOXINE (VITAMIN B-6) 100 MG tablet, Take 100 mg by mouth daily., Disp: , Rfl:    rosuvastatin (CRESTOR) 10 MG tablet, Take 1 tablet (10 mg total) by mouth daily., Disp: 90 tablet, Rfl: 3   torsemide (DEMADEX) 20 MG tablet, Take 1 tablet (20 mg total) by mouth daily., Disp: 90 tablet, Rfl: 3   verapamil (CALAN-SR) 240 MG CR tablet, TAKE 1 TABLET BY MOUTH EVERY DAY, Disp: 90 tablet, Rfl: 3   Vitamin D, Ergocalciferol, (DRISDOL) 1.25 MG (50000 UNIT) CAPS capsule, Take 50,000 Units by mouth every 7 (seven) days. Monday, Disp: , Rfl:    methotrexate (RHEUMATREX) 2.5 MG tablet, Take 15 mg by mouth once a week. Monday evening (Patient not taking: Reported on 03/04/2021), Disp: , Rfl:    metoprolol tartrate (LOPRESSOR) 25 MG tablet, Take 0.5 tablets (12.5 mg total) by mouth every 2 (two) hours as needed for up to 1 dose (Take 1 as needed for palpitations, repeat in 2 hours if not better). (Patient not taking: Reported on 03/04/2021), Disp: 10 tablet,  Rfl: 0   predniSONE (DELTASONE) 20 MG tablet, Take 20 mg by mouth 2 (two) times daily., Disp: , Rfl:   Current Facility-Administered Medications:    0.9 %  sodium chloride infusion, , Intravenous, PRN, Eileen Stanford, PA-C      Objective:   Vitals:   03/04/21 1329  BP: 112/68  Pulse: 79  SpO2: 97%  Weight: 153 lb (69.4 kg)  Height: _0  (1.6 m)    Estimated body mass index is 27.1 kg/m as calculated from the following:   Height as of this encounter: _1  (1.6 m).   Weight as of this encounter: 153 lb (69.4 kg).  _2 @  Filed Weights   03/04/21 1329  Weight: 153 lb (69.4 kg)     Physical Exam General: No distress. Looks well Neuro: Alert and Oriented x 3. GCS 15. Speech normal Psych: Pleasant Resp:  Barrel Chest - no.  Wheeze - no, Crackles - no, No overt respiratory distress CVS: Normal heart sounds. Murmurs - no Ext: Stigmata of Connective Tissue Disease - no HEENT: Normal upper airway. PEERL +. No post nasal drip        Assessment:       ICD-10-CM   1. Moderate persistent asthma without complication  Z61.09 POCT EXHALED NITRIC OXIDE         Plan:     Patient Instructions     ICD-10-CM   1. Moderate persistent asthma without complication  U04.54 POCT EXHALED NITRIC OXIDE       asthma is under good control though feno test is borderline normal-hgh Significant side  effects with 4th covid mRNA bivalent booster  Plan  - Continue Dulera and singulair scheduled  - Use albuterol as needed  - cma to do drefill - List covid mRNA vaccine as allergy 0 if astham flares up call us   Follow-up - Return in 6 months or sooner if needed  -check ACT score and also FeNO at follow-up    SIGNATURE    Dr. Brand Males, M.D., F.C.C.P,  Pulmonary and Critical Care Medicine Staff Physician, [place] Director - Interstitial Lung Disease  Program  Pulmonary [place] at [place], [place], [address]  Pager: [phone], If no answer or between  15:00h - 7:00h: call [phone] Telephone: [phone]  1:55 PM 03/04/2021

## 2021-03-04 NOTE — Patient Instructions (Addendum)
ICD-10-CM   1. Moderate persistent asthma without complication  J33.54 POCT EXHALED NITRIC OXIDE       asthma is under good control though feno test is borderline normal-hgh Significant side effects with 4th covid mRNA bivalent booster  Plan  - Continue Dulera and singulair scheduled  - Use albuterol as needed  - cma to do drefill - List covid mRNA vaccine as allergy 0 if astham flares up call us   Follow-up - Return in 6 months or sooner if needed  -check ACT score and also FeNO at follow-up

## 2021-03-09 DIAGNOSIS — M0579 Rheumatoid arthritis with rheumatoid factor of multiple sites without organ or systems involvement: Secondary | ICD-10-CM | POA: Diagnosis not present

## 2021-03-31 ENCOUNTER — Encounter (HOSPITAL_BASED_OUTPATIENT_CLINIC_OR_DEPARTMENT_OTHER): Payer: Self-pay

## 2021-03-31 ENCOUNTER — Emergency Department (HOSPITAL_BASED_OUTPATIENT_CLINIC_OR_DEPARTMENT_OTHER): Payer: Medicare Other

## 2021-03-31 ENCOUNTER — Emergency Department (HOSPITAL_BASED_OUTPATIENT_CLINIC_OR_DEPARTMENT_OTHER)
Admission: EM | Admit: 2021-03-31 | Discharge: 2021-03-31 | Disposition: A | Discharge status: Home / Self Care | Payer: Medicare Other | Attending: Emergency Medicine | Admitting: Emergency Medicine

## 2021-03-31 ENCOUNTER — Other Ambulatory Visit: Payer: Self-pay

## 2021-03-31 DIAGNOSIS — I10 Essential (primary) hypertension: Secondary | ICD-10-CM | POA: Diagnosis not present

## 2021-03-31 DIAGNOSIS — Z20822 Contact with and (suspected) exposure to covid-19: Secondary | ICD-10-CM | POA: Insufficient documentation

## 2021-03-31 DIAGNOSIS — R1011 Right upper quadrant pain: Secondary | ICD-10-CM | POA: Insufficient documentation

## 2021-03-31 DIAGNOSIS — R112 Nausea with vomiting, unspecified: Secondary | ICD-10-CM | POA: Diagnosis not present

## 2021-03-31 DIAGNOSIS — I700 Atherosclerosis of aorta: Secondary | ICD-10-CM | POA: Diagnosis not present

## 2021-03-31 DIAGNOSIS — Z7982 Long term (current) use of aspirin: Secondary | ICD-10-CM | POA: Diagnosis not present

## 2021-03-31 DIAGNOSIS — Z79899 Other long term (current) drug therapy: Secondary | ICD-10-CM | POA: Insufficient documentation

## 2021-03-31 DIAGNOSIS — R109 Unspecified abdominal pain: Secondary | ICD-10-CM | POA: Diagnosis not present

## 2021-03-31 DIAGNOSIS — K5730 Diverticulosis of large intestine without perforation or abscess without bleeding: Secondary | ICD-10-CM | POA: Diagnosis not present

## 2021-03-31 LAB — URINALYSIS, ROUTINE W REFLEX MICROSCOPIC
Bilirubin Urine: NEGATIVE
Glucose, UA: NEGATIVE mg/dL
Hgb urine dipstick: NEGATIVE
Ketones, ur: NEGATIVE mg/dL
Leukocytes,Ua: NEGATIVE
Nitrite: NEGATIVE
Protein, ur: NEGATIVE mg/dL
Specific Gravity, Urine: 1.013 (ref 1.005–1.030)
pH: 7 (ref 5.0–8.0)

## 2021-03-31 LAB — CBC WITH DIFFERENTIAL/PLATELET
Abs Immature Granulocytes: 0.02 10*3/uL (ref 0.00–0.07)
Basophils Absolute: 0.1 10*3/uL (ref 0.0–0.1)
Basophils Relative: 1 %
Eosinophils Absolute: 0.3 10*3/uL (ref 0.0–0.5)
Eosinophils Relative: 4 %
HCT: 43.6 % (ref 36.0–46.0)
Hemoglobin: 13.9 g/dL (ref 12.0–15.0)
Immature Granulocytes: 0 %
Lymphocytes Relative: 27 %
Lymphs Abs: 2.2 10*3/uL (ref 0.7–4.0)
MCH: 28.8 pg (ref 26.0–34.0)
MCHC: 31.9 g/dL (ref 30.0–36.0)
MCV: 90.5 fL (ref 80.0–100.0)
Monocytes Absolute: 0.6 10*3/uL (ref 0.1–1.0)
Monocytes Relative: 7 %
Neutro Abs: 5 10*3/uL (ref 1.7–7.7)
Neutrophils Relative %: 61 %
Platelets: 287 10*3/uL (ref 150–400)
RBC: 4.82 MIL/uL (ref 3.87–5.11)
RDW: 13.9 % (ref 11.5–15.5)
WBC: 8.1 10*3/uL (ref 4.0–10.5)
nRBC: 0 % (ref 0.0–0.2)

## 2021-03-31 LAB — COMPREHENSIVE METABOLIC PANEL
ALT: 17 U/L (ref 0–44)
AST: 16 U/L (ref 15–41)
Albumin: 4.2 g/dL (ref 3.5–5.0)
Alkaline Phosphatase: 94 U/L (ref 38–126)
Anion gap: 8 (ref 5–15)
BUN: 14 mg/dL (ref 8–23)
CO2: 29 mmol/L (ref 22–32)
Calcium: 9.4 mg/dL (ref 8.9–10.3)
Chloride: 103 mmol/L (ref 98–111)
Creatinine, Ser: 0.83 mg/dL (ref 0.44–1.00)
GFR, Estimated: >60 mL/min (ref >60)
Glucose, Bld: 89 mg/dL (ref 70–99)
Potassium: 3.8 mmol/L (ref 3.5–5.1)
Sodium: 140 mmol/L (ref 135–145)
Total Bilirubin: 0.4 mg/dL (ref 0.3–1.2)
Total Protein: 7.1 g/dL (ref 6.5–8.1)

## 2021-03-31 LAB — RESP PANEL BY RT-PCR (FLU A&B, COVID) ARPGX2
Influenza A by PCR: NEGATIVE
Influenza B by PCR: NEGATIVE
SARS Coronavirus 2 by RT PCR: NEGATIVE

## 2021-03-31 LAB — LIPASE, BLOOD: Lipase: 11 U/L (ref 11–51)

## 2021-03-31 MED ORDER — MORPHINE SULFATE (PF) 2 MG/ML IV SOLN
2.0000 mg | Freq: Once | INTRAVENOUS | Status: AC
  Administered 2021-03-31: 2 mg via INTRAVENOUS
  Filled 2021-03-31: qty 1

## 2021-03-31 MED ORDER — ONDANSETRON HCL 4 MG/2ML IJ SOLN
4.0000 mg | Freq: Once | INTRAMUSCULAR | Status: AC
  Administered 2021-03-31: 4 mg via INTRAVENOUS
  Filled 2021-03-31: qty 2

## 2021-03-31 MED ORDER — METHOCARBAMOL 500 MG PO TABS
500.0000 mg | ORAL_TABLET | Freq: Two times a day (BID) | ORAL | 0 refills | Status: DC
Start: 2021-03-31 — End: 2021-06-01

## 2021-03-31 MED ORDER — SODIUM CHLORIDE 0.9 % IV BOLUS
1000.0000 mL | Freq: Once | INTRAVENOUS | Status: AC
  Administered 2021-03-31: 1000 mL via INTRAVENOUS

## 2021-03-31 NOTE — Discharge Instructions (Signed)
Your work-up today was reassuring.  We saw no evidence of kidney stone, gallbladder or liver pathology.  You did not have any abnormalities of your lab work, you do not have a fever.  I do think that you may have a reaction to morphine, I would recommend that you try to avoid it in the past to prevent yourself from having nausea.  Given your pain and symptoms it is unclear what is going on, I do have some suspicion that it could be either muscle strain with or without some aspect of a viral GI bug in addition.  I recommend plenty of fluids, rest and I will prescribe you a muscle relaxant to help with any possible muscle or skeletal pain on the right side.  Please check back in with your primary care doctor for reevaluation if you have any questions or concerns.  Please return to the emergency department if your pain worsens or fails to improve despite treatment.

## 2021-03-31 NOTE — ED Provider Notes (Signed)
[facility] EMERGENCY DEPT Provider Note   CSN: [id number] Arrival date & time: 03/31/21  1140     History  Chief Complaint  Patient presents with   Flank Pain    Brandy Townsend is a 71 y.o. female with a past medical history significant for hypertension, autism, rheumatoid arthritis, and previous history of nephrolithiasis presents with 2 days of right flank pain is gradually worsening.  Patient reports that she has had pain on and off for a few days, but it was intermittent, sporadic, but today it became sharp in nature, constant, is associated some with nausea without vomiting.  Patient reports she has not taken anything for pain at this time.  Patient reports that it does feel like her previous kidney stones.  Patient denies any dysuria, hematuria.  Patient denies any diarrhea, constipation, hematochezia.  Patient denies any chest pain, shortness of breath.   Flank Pain      Home Medications Prior to Admission medications   Medication Sig Start Date End Date Taking? Authorizing Provider  methocarbamol (ROBAXIN) 500 MG tablet Take 1 tablet (500 mg total) by mouth 2 (two) times daily. 03/31/21  Yes Zayd Bonet H, PA-C  abatacept (ORENCIA) 250 MG injection See admin instructions.    [provider]  albuterol (VENTOLIN HFA) 108 (90 Base) MCG/ACT inhaler Inhale 1-2 puffs into the lungs every 6 (six) hours as needed for wheezing or shortness of breath. 03/04/21   Brand Males, MD  aspirin EC 81 MG tablet Take 1 tablet (81 mg total) by mouth daily. Swallow whole. 12/17/19   Burtis Junes, NP  BREO ELLIPTA 100-25 MCG/INH AEPB TAKE 1 PUFF BY MOUTH EVERY DAY 01/04/21   Brand Males, MD  cetirizine (ZYRTEC) 10 MG tablet Take 10 mg by mouth at bedtime as needed for allergies.     [provider]  folic acid (FOLVITE) 1 MG tablet Take 3 mg by mouth daily. Only takes on days she takes methotrexate 11/13/19   [provider]  KLOR-CON 10  10 MEQ tablet Take 10 mEq by mouth daily. 08/11/14   [provider]  levothyroxine (SYNTHROID) 50 MCG tablet Take 50 mcg by mouth daily.  08/16/18   [provider]  medroxyPROGESTERone (PROVERA) 10 MG tablet Take 30 mg by mouth at bedtime. 06/16/20   [provider]  methotrexate (RHEUMATREX) 2.5 MG tablet Take 15 mg by mouth once a week. Monday evening Patient not taking: Reported on 03/04/2021    [provider]  metoprolol tartrate (LOPRESSOR) 25 MG tablet Take 0.5 tablets (12.5 mg total) by mouth every 2 (two) hours as needed for up to 1 dose (Take 1 as needed for palpitations, repeat in 2 hours if not better). Patient not taking: Reported on 03/04/2021 09/01/20   Lacretia Leigh, MD  montelukast (SINGULAIR) 10 MG tablet Take 10 mg by mouth daily. 11/17/20   [provider]  nitroGLYCERIN (NITROSTAT) 0.4 MG SL tablet Place 1 tablet (0.4 mg total) under the tongue every 5 (five) minutes as needed for chest pain. 05/27/20   Josue Hector, MD  predniSONE (DELTASONE) 20 MG tablet Take 20 mg by mouth 2 (two) times daily. 11/17/20   [provider]  pyridOXINE (VITAMIN B-6) 100 MG tablet Take 100 mg by mouth daily.    [provider]  rosuvastatin (CRESTOR) 10 MG tablet Take 1 tablet (10 mg total) by mouth daily. 12/31/20   Josue Hector, MD  torsemide (DEMADEX) 20 MG tablet Take  1 tablet (20 mg total) by mouth daily. 05/15/19   Burtis Junes, NP  verapamil (CALAN-SR) 240 MG CR tablet TAKE 1 TABLET BY MOUTH EVERY DAY 05/11/20   Burtis Junes, NP  Vitamin D, Ergocalciferol, (DRISDOL) 1.25 MG (50000 UNIT) CAPS capsule Take 50,000 Units by mouth every 7 (seven) days. Monday    [provider]      Allergies    Cat hair extract, Dust mite extract, and Macrobid [nitrofurantoin monohyd macro]    Review of Systems   Review of Systems  Genitourinary:  Positive for flank pain.  All other systems reviewed and are negative.  Physical  Exam Updated Vital Signs BP 120/65 (BP Location: Right Arm)    Pulse 74    Temp 98 F (36.7 C) (Oral)    Resp 18    Ht 5\' 3"  (1.6 m)    Wt 68 kg    SpO2 98%    BMI 26.57 kg/m  Physical Exam Vitals and nursing note reviewed.  Constitutional:      General: She is not in acute distress.    Appearance: Normal appearance.  HENT:     Head: Normocephalic and atraumatic.  Eyes:     General:        Right eye: No discharge.        Left eye: No discharge.  Cardiovascular:     Rate and Rhythm: Normal rate and regular rhythm.     Heart sounds: No murmur heard.   No friction rub. No gallop.  Pulmonary:     Effort: Pulmonary effort is normal.     Breath sounds: Normal breath sounds.  Abdominal:     General: Bowel sounds are normal.     Palpations: Abdomen is soft.     Comments: Patient has tenderness to palpation in the right upper quadrant versus right flank, with positive right CVA tenderness.  She has no tenderness to palpation throughout the rest of the abdomen.  She has no rebound, rigidity, guarding.  Her abdomen is not distended, there is no evidence of ecchymosis or other disturbance to the skin surface.  Skin:    General: Skin is warm and dry.     Capillary Refill: Capillary refill takes less than 2 seconds.  Neurological:     Mental Status: She is alert and oriented to person, place, and time.  Psychiatric:        Mood and Affect: Mood normal.        Behavior: Behavior normal.    ED Results / Procedures / Treatments   Labs (all labs ordered are listed, but only abnormal results are displayed) Labs Reviewed  RESP PANEL BY RT-PCR (FLU A&B, COVID) ARPGX2  URINALYSIS, ROUTINE W REFLEX MICROSCOPIC  CBC WITH DIFFERENTIAL/PLATELET  COMPREHENSIVE METABOLIC PANEL  LIPASE, BLOOD    EKG None  Radiology CT Renal Stone Study  Result Date: 03/31/2021 CLINICAL DATA:  right flank pain for 2 days.  History kidney stones. EXAM: CT ABDOMEN AND PELVIS WITHOUT CONTRAST TECHNIQUE:  Multidetector CT imaging of the abdomen and pelvis was performed following the standard protocol without IV contrast. COMPARISON:  06/05/2020 FINDINGS: Lower chest: Bibasilar scarring. Normal heart size without pericardial or pleural effusion. Right coronary artery calcification. Hepatobiliary: Normal liver. Normal gallbladder, without biliary ductal dilatation. Pancreas: Normal, without mass or ductal dilatation. Spleen: Normal in size, without focal abnormality. Adrenals/Urinary Tract: Normal adrenal glands. No renal calculi or hydronephrosis. No hydroureter or ureteric calculi. No bladder calculi. Stomach/Bowel: Normal stomach, without  wall thickening. Extensive colonic diverticulosis. Large amount of stool within the cecum. Normal terminal ileum and appendix. Normal small bowel. Vascular/Lymphatic: Aortic atherosclerosis. No abdominopelvic adenopathy. Reproductive: Normal uterus and adnexa. Other: No significant free fluid.  No free intraperitoneal air. Musculoskeletal: Lumbosacral spondylosis. S shaped lumbar spine curvature. IMPRESSION: 1. No urinary tract calculi or hydronephrosis. 2. Possible constipation. 3. Coronary artery atherosclerosis. 4. Aortic Atherosclerosis (ICD10-I70.0). Electronically Signed   By: Abigail Miyamoto M.D.   On: 03/31/2021 17:36   US Abdomen Limited RUQ (LIVER/GB)  Result Date: 03/31/2021 CLINICAL DATA:  Right upper quadrant pain. EXAM: ULTRASOUND ABDOMEN LIMITED RIGHT UPPER QUADRANT COMPARISON:  Abdominopelvic CT earlier today. FINDINGS: Gallbladder: Physiologically distended. No gallstones or wall thickening visualized. No sonographic Murphy sign noted by sonographer. Common bile duct: Diameter: 4 mm, normal. Liver: No focal lesion identified. Within normal limits in parenchymal echogenicity. Portal vein is patent on color Doppler imaging with normal direction of blood flow towards the liver. Other: No right upper quadrant ascites. IMPRESSION: Unremarkable right upper quadrant  ultrasound. No gallstones or biliary dilatation. Electronically Signed   By: Keith Rake M.D.   On: 03/31/2021 19:03    Procedures Procedures    Medications Ordered in ED Medications  ondansetron (ZOFRAN) injection 4 mg (4 mg Intravenous Given 03/31/21 1612)  morphine 2 MG/ML injection 2 mg (2 mg Intravenous Given 03/31/21 1613)  morphine 2 MG/ML injection 2 mg (2 mg Intravenous Given 03/31/21 1731)  ondansetron (ZOFRAN) injection 4 mg (4 mg Intravenous Given 03/31/21 1910)  sodium chloride 0.9 % bolus 1,000 mL (0 mLs Intravenous Stopped 03/31/21 2119)    ED Course/ Medical Decision Making/ A&P                           Medical Decision Making  This patient who presents with sharp right upper quadrant versus right flank pain.  This is a serious problem that has potential life-threatening implications and deserves a work-up in the emergency department.  I differential diagnosis includes nephrolithiasis, cholecystitis, symptomatic cholelithiasis, choledocholithiasis, pancreatitis, acid reflux, muscle strain of the obliques, or abdominal muscles, lower lobar pneumonia.  I have ordered and independently reviewed lab results including CBC, CMP, urinalysis, lipase all of which have unremarkable results.  In this context I think it is less likely that patient has a problem with her gallbladder, liver, pancreas, has an acute infection or cholangitis.  Due to history of kidney stones, even with a urinalysis without hematuria we will perform a CT stone study to evaluate for kidney stone.  CT stone study does not reveal any kidney stone.  I do agree with the radiologist interpretation.  CT stone study does report that I got a look at the biliary ducts and did not see any dilation.  Patient does continue have significant right upper quadrant pain so we will obtain an ultrasound of the right upper quadrant to evaluate for further gallbladder, liver pathology.  Patient understands agrees to plan.  I have  ordered medications including morphine for pain, Zofran for nausea.  Patient symptoms improved with these medications.  Ultrasound of the right upper quadrant shows no acute pathology.  We ordered a respiratory virus panel which shows no evidence of COVID, or flu.  On further exam and history with the patient she believes that the morphine is contributing to some of her nausea.  In this context we discussed the differential for her pain which includes musculoskeletal pain related to possibly a pulled  muscle, or bruised rib of unknown cause plus or minus a viral gastroenteritis contributing to some of her nausea and diarrhea.  Prior to discharge patient is feeling slightly better, is able to pass a p.o. challenge.  She is discharged in stable condition with strict return precautions. Final Clinical Impression(s) / ED Diagnoses Final diagnoses:  RUQ abdominal pain  Nausea and vomiting, unspecified vomiting type    Rx / DC Orders ED Discharge Orders          Ordered    methocarbamol (ROBAXIN) 500 MG tablet  2 times daily        03/31/21 2129              Dorien Chihuahua 03/31/21 2141    Truddie Hidden, MD 03/31/21 2300

## 2021-03-31 NOTE — ED Triage Notes (Signed)
Pt c/o R flank pain x 2 days with gradual worsening. Pt states at times the pain feels like previous kidney stones.

## 2021-04-02 ENCOUNTER — Telehealth: Payer: Self-pay | Admitting: Internal Medicine

## 2021-04-02 NOTE — Telephone Encounter (Signed)
Brandy Townsend 2 -is my office patient.  She was here with her husband as a companion to the husband.  The husband was in an office visit with me.  During this time she expressed to me that she was in the ER 2 days ago with right-sided abdominal pain.  She asked me to review the results which I did.  She did give a history that prior to that she was lifting something heavy although did not feel anything immediately the following day she has had this right flank pain.  Her abdomen was very tender to touch even with a finger point touch.  Urinary stone was removed.  Labs looked unremarkable.  I did express to her that I agreed with the EDP that this sounds like a muscle pull but did also express to her that because on pulmonary that she had follow the advice of the EDP and follow-up with primary care physician

## 2021-04-05 DIAGNOSIS — K5730 Diverticulosis of large intestine without perforation or abscess without bleeding: Secondary | ICD-10-CM | POA: Diagnosis not present

## 2021-04-05 DIAGNOSIS — I10 Essential (primary) hypertension: Secondary | ICD-10-CM | POA: Diagnosis not present

## 2021-04-05 DIAGNOSIS — K5900 Constipation, unspecified: Secondary | ICD-10-CM | POA: Diagnosis not present

## 2021-04-05 DIAGNOSIS — R109 Unspecified abdominal pain: Secondary | ICD-10-CM | POA: Diagnosis not present

## 2021-04-05 DIAGNOSIS — Z1211 Encounter for screening for malignant neoplasm of colon: Secondary | ICD-10-CM | POA: Diagnosis not present

## 2021-04-06 ENCOUNTER — Encounter: Payer: Self-pay | Admitting: *Deleted

## 2021-04-09 ENCOUNTER — Encounter: Payer: Self-pay | Admitting: *Deleted

## 2021-04-09 ENCOUNTER — Ambulatory Visit (INDEPENDENT_AMBULATORY_CARE_PROVIDER_SITE_OTHER): Payer: Medicare Other | Admitting: Gastroenterology

## 2021-04-09 VITALS — BP 142/70 | HR 67 | Ht 63.0 in | Wt 151.0 lb

## 2021-04-09 DIAGNOSIS — Z1211 Encounter for screening for malignant neoplasm of colon: Secondary | ICD-10-CM

## 2021-04-09 MED ORDER — PLENVU 140 G PO SOLR: 1.0000 | ORAL | 0 refills | Status: DC

## 2021-04-09 NOTE — Patient Instructions (Addendum)
It was a pleasure to meet you today.  I have recommended a colonoscopy.   Tips for colonoscopy:  - Stay well hydrated for 3-4 days prior to the exam. This reduces nausea and dehydration.  - To prevent skin/hemorrhoid irritation - prior to wiping, put A&Dointment or vaseline on the toilet paper. - Keep a towel or pad on the bed.  - Drink  64oz of clear liquids in the morning of prep day (prior to starting the prep) to be sure that there is enough fluid to flush the colon and stay hydrated!!!! This is in addition to the fluids required for preparation. - Use of a flavored hard candy, such as grape Anise Salvo, can counteract some of the flavor of the prep and may prevent some nausea.    You have been scheduled for a colonoscopy. Please follow written instructions given to you at your visit today.  Please pick up your prep supplies at the pharmacy within the next 1-3 days. If you use inhalers (even only as needed), please bring them with you on the day of your procedure.  We will request records from Dr Kalman Shan.  If you are age 2 or older, your body mass index should be between 23-30. Your Body mass index is 26.75 kg/m. If this is out of the aforementioned range listed, please consider follow up with your Primary Care Provider.  If you are age 46 or younger, your body mass index should be between 19-25. Your Body mass index is 26.75 kg/m. If this is out of the aformentioned range listed, please consider follow up with your Primary Care Provider.   ________________________________________________________  The [facility] providers would like to encourage you to use [facility] to communicate with providers for non-urgent requests or questions.  Due to long hold times on the telephone, sending your provider a message by [facility] may be a faster and more efficient way to get a response.  Please allow 48 business hours for a response.  Please remember that this is for non-urgent requests.   _______________________________________________________  Due to recent changes in healthcare laws, you may see the results of your imaging and laboratory studies on MyChart before your provider has had a chance to review them.  We understand that in some cases there may be results that are confusing or concerning to you. Not all laboratory results come back in the same time frame and the provider may be waiting for multiple results in order to interpret others.  Please give [place] 48 hours in order for your provider to thoroughly review all the results before contacting the office for clarification of your results.

## 2021-04-09 NOTE — Progress Notes (Addendum)
Referring Provider: Reynold Bowen, MD Primary Care Physician:  Reynold Bowen, MD  Reason for Consultation:  Abdominal pain and constipation   IMPRESSION:  Recent abdominal pain located to the RUQ with chronic constipation Abnormal CT scan of the colon: large amount of stool in the cecum Prior colonoscopy with Dr. Cristina Gong 2011  Is pain and constipation related to cecal abnormality? Redundant colon? Chilaiditi syndrome?  PLAN: Colonoscopy with a 2 day bowel prep Obtain records from Dr. Cristina Gong re: prior colonoscopy   HPI: Brandy Townsend is a 71 y.o. female referred by Dr. Forde Dandy for constipation. The history is obtained through the patient and review of her electronic health record. She has RA, asthma, hypercholesterolemia, hypertension, and a history of kidney stones.   She reports RUQ pain related to feeling stool in the RUQ. Pain was so severe that she went to the ED for evaluation. CT renal protocol was negative for kidney stones. RUQ ultrasound showed no abnormalities. Labs were grossly normal.  Used a Miralax purge earlier this week for symptom control. Has been having successful bowel movements since that time.   Baseline bowel habits are one formed BM today. Sense of complete evacuation. No blood or mucous in the stool.   She wondered if she tore a muscle that caused a problem. She is concerned that something is wrong in her colon or if her colon is not working in a certain pain.   There has been no recent abdominal imaging. Renal CT 04/01/19 showed a large amount of stool in the cecum.  Last colonoscopy 2011 with Dr. Cristina Gong. The prep was difficult to tolerate. She is overdue but motivated to proceed with colonoscopy now.  There is no known family history of colon cancer or polyps. No family history of stomach cancer or other GI malignancy. No family history of inflammatory bowel disease or celiac.   Her husband has esophageal cancer followed by Dr. Benay Spice. 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  Her daughter has breast cancer.    Past Medical History:  Diagnosis Date   Asthma    CAD (coronary artery disease)    Diverticulosis    Graves disease    s/p RAI   High cholesterol    Hypertension    Hypothyroidism    Nephrolithiasis    Osteoporosis    Rheumatoid arthritis ([place])    Vertigo     Past Surgical History:  Procedure Laterality Date   BRONCHIAL WASHINGS  09/12/2019   Procedure: BRONCHIAL WASHINGS;  Surgeon: Brand Males, MD;  Location: [facility];  Service: Endoscopy;;   INTRAVASCULAR PRESSURE WIRE/FFR STUDY N/A 01/08/2020   Procedure: INTRAVASCULAR PRESSURE WIRE/FFR STUDY;  Surgeon: Martinique, Peter M, MD;  Location: [facility];  Service: Cardiovascular;  Laterality: N/A;   LEFT HEART CATH AND CORONARY ANGIOGRAPHY N/A 01/08/2020   Procedure: LEFT HEART CATH AND CORONARY ANGIOGRAPHY;  Surgeon: Martinique, Peter M, MD;  Location: [facility];  Service: Cardiovascular;  Laterality: N/A;   VIDEO BRONCHOSCOPY N/A 09/12/2019   Procedure: VIDEO BRONCHOSCOPY WITHOUT FLUORO;  Surgeon: Brand Males, MD;  Location: [facility];  Service: Endoscopy;  Laterality: N/A;     Current Outpatient Medications  Medication Sig Dispense Refill   abatacept (ORENCIA) 250 MG injection See admin instructions.     albuterol (VENTOLIN HFA) 108 (90 Base) MCG/ACT inhaler Inhale 1-2 puffs into the lungs every 6 (six) hours as needed for wheezing or  shortness of breath. 8 g 2   aspirin EC 81 MG tablet Take 1 tablet (81 mg total) by mouth daily. Swallow whole. 90 tablet 3   BREO ELLIPTA 100-25 MCG/INH AEPB TAKE 1 PUFF BY MOUTH EVERY DAY 60 each 5   cetirizine (ZYRTEC) 10 MG tablet Take 10 mg by mouth at bedtime as needed for allergies.      folic acid (FOLVITE) 1 MG tablet Take 3 mg by mouth daily. Only takes on days she takes methotrexate     KLOR-CON 10 10 MEQ tablet Take 10 mEq by  mouth daily.  0   levothyroxine (SYNTHROID) 50 MCG tablet Take 50 mcg by mouth daily.      methocarbamol (ROBAXIN) 500 MG tablet Take 1 tablet (500 mg total) by mouth 2 (two) times daily. 20 tablet 0   montelukast (SINGULAIR) 10 MG tablet Take 10 mg by mouth daily.     nitroGLYCERIN (NITROSTAT) 0.4 MG SL tablet Place 1 tablet (0.4 mg total) under the tongue every 5 (five) minutes as needed for chest pain. 25 tablet 3   PEG-KCl-NaCl-NaSulf-Na Asc-C (PLENVU) 140 g SOLR Take 1 kit by mouth as directed. Use coupon: BIN: [id number] PNC: CNRX Group: [id number] ID: [id number] 1 each 0   pyridOXINE (VITAMIN B-6) 100 MG tablet Take 100 mg by mouth daily.     rosuvastatin (CRESTOR) 10 MG tablet Take 1 tablet (10 mg total) by mouth daily. 90 tablet 3   torsemide (DEMADEX) 20 MG tablet Take 1 tablet (20 mg total) by mouth daily. 90 tablet 3   verapamil (CALAN-SR) 240 MG CR tablet TAKE 1 TABLET BY MOUTH EVERY DAY 90 tablet 3   Vitamin D, Ergocalciferol, (DRISDOL) 1.25 MG (50000 UNIT) CAPS capsule Take 50,000 Units by mouth every 7 (seven) days. Monday     Current Facility-Administered Medications  Medication Dose Route Frequency Provider Last Rate Last Admin   0.9 %  sodium chloride infusion   Intravenous PRN Angelena Form R, PA-C        Allergies as of 04/09/2021 - Review Complete 04/09/2021  Allergen Reaction Noted   Cat hair extract  06/05/2020   Dust mite extract  06/05/2020   Morphine and related Nausea And Vomiting 04/09/2021   Macrobid [nitrofurantoin monohyd macro] Other (See Comments) 05/16/2015    Family History  Problem Relation Age of Onset   Breast cancer Mother    Heart disease Father    Cancer Daughter 68       stage 3 breast     Social History   Socioeconomic History   Marital status: Married    Spouse name: Shanon Brow   Number of children: 2   Years of education: Not on file   Highest education level: Not on file  Occupational History   Occupation: retired    Comment:  Statistician  Tobacco Use   Smoking status: Former    Packs/day: 1.00    Years: 20.00    Pack years: 20.00    Types: Cigarettes    Quit date: 08/26/2008    Years since quitting: 12.6   Smokeless tobacco: Never   Tobacco comments:    social smoker  Vaping Use   Vaping Use: Never used  Substance and Sexual Activity   Alcohol use: No   Drug use: No   Sexual activity: Not Currently  Other Topics Concern   Not on file  Social History Narrative   Not on file   Social Determinants of Health   Financial  Resource Strain: Not on file  Food Insecurity: Not on file  Transportation Needs: Not on file  Physical Activity: Not on file  Stress: Not on file  Social Connections: Not on file  Intimate Partner Violence: Not on file    Review of Systems: 12 system ROS is negative except as noted above with the addition of arthritis and haring problems.   Physical Exam: General:   Alert,  well-nourished, pleasant and cooperative in NAD Head:  Normocephalic and atraumatic. Eyes:  Sclera clear, no icterus.   Conjunctiva pink. Ears:  Normal auditory acuity. Nose:  No deformity, discharge,  or lesions. Mouth:  No deformity or lesions.   Neck:  Supple; no masses or thyromegaly. Lungs:  Clear throughout to auscultation.   No wheezes. Heart:  Regular rate and rhythm; no murmurs. Abdomen:  Soft, nontender, nondistended, normal bowel sounds, no rebound or guarding. No hepatosplenomegaly.   Rectal:  Deferred  Msk:  Symmetrical. No boney deformities LAD: No inguinal or umbilical LAD Extremities:  No clubbing or edema. Neurologic:  Alert and  oriented x4;  grossly nonfocal Skin:  Intact without significant lesions or rashes. Psych:  Alert and cooperative. Normal mood and affect.   Raimi Guillermo L. Tarri Glenn, MD, MPH 04/18/2021, 7:38 PM

## 2021-04-15 DIAGNOSIS — M0579 Rheumatoid arthritis with rheumatoid factor of multiple sites without organ or systems involvement: Secondary | ICD-10-CM | POA: Diagnosis not present

## 2021-04-15 DIAGNOSIS — Z79899 Other long term (current) drug therapy: Secondary | ICD-10-CM | POA: Diagnosis not present

## 2021-04-18 ENCOUNTER — Encounter: Payer: Self-pay | Admitting: Gastroenterology

## 2021-04-23 ENCOUNTER — Other Ambulatory Visit: Payer: Self-pay | Admitting: *Deleted

## 2021-04-23 MED ORDER — VERAPAMIL HCL ER 240 MG PO TBCR: 240.0000 mg | EXTENDED_RELEASE_TABLET | Freq: Every day | ORAL | 3 refills | Status: DC

## 2021-04-26 ENCOUNTER — Other Ambulatory Visit: Payer: Self-pay

## 2021-04-26 ENCOUNTER — Ambulatory Visit (AMBULATORY_SURGERY_CENTER): Payer: Medicare Other | Admitting: Gastroenterology

## 2021-04-26 ENCOUNTER — Encounter: Payer: Self-pay | Admitting: Gastroenterology

## 2021-04-26 VITALS — BP 116/67 | HR 71 | Temp 97.7°F | Resp 13 | Ht 63.0 in | Wt 151.0 lb

## 2021-04-26 DIAGNOSIS — R1084 Generalized abdominal pain: Secondary | ICD-10-CM

## 2021-04-26 DIAGNOSIS — R933 Abnormal findings on diagnostic imaging of other parts of digestive tract: Secondary | ICD-10-CM

## 2021-04-26 DIAGNOSIS — K5730 Diverticulosis of large intestine without perforation or abscess without bleeding: Secondary | ICD-10-CM

## 2021-04-26 DIAGNOSIS — Z1211 Encounter for screening for malignant neoplasm of colon: Secondary | ICD-10-CM | POA: Diagnosis not present

## 2021-04-26 DIAGNOSIS — D123 Benign neoplasm of transverse colon: Secondary | ICD-10-CM | POA: Diagnosis not present

## 2021-04-26 DIAGNOSIS — K5909 Other constipation: Secondary | ICD-10-CM

## 2021-04-26 MED ORDER — SODIUM CHLORIDE 0.9 % IV SOLN: 500.0000 mL | Freq: Once | INTRAVENOUS | Status: DC

## 2021-04-26 MED ORDER — SODIUM CHLORIDE 0.9 % IV SOLN
4.0000 mg | Freq: Once | INTRAVENOUS | Status: AC
  Administered 2021-04-26: 4 mg via INTRAVENOUS

## 2021-04-26 MED ORDER — POLYETHYLENE GLYCOL 3350 17 GM/SCOOP PO POWD: ORAL | 3 refills | Status: DC

## 2021-04-26 NOTE — Op Note (Signed)
[facility] Patient Name: Brandy Townsend Procedure Date: 04/26/2021 7:21 AM MRN: [id number] Endoscopist: Thornton Park MD, MD Age: 71 Referring MD:  Date of Birth: 10-Oct-1950 Gender: Female Account #: [id number] Procedure:                Colonoscopy Indications:              Generalized abdominal pain, Abnormal CT of the GI                            tract (large amount of stool in the cecum)                           Prior colonoscopy in 2011 with Dr. Harlene Ramus Medicines:                Monitored Anesthesia Care Procedure:                Pre-Anesthesia Assessment:                           - Prior to the procedure, a History and Physical                            was performed, and patient medications and                            allergies were reviewed. The patient's tolerance of                            previous anesthesia was also reviewed. The risks                            and benefits of the procedure and the sedation                            options and risks were discussed with the patient.                            All questions were answered, and informed consent                            was obtained. Prior Anticoagulants: The patient has                            taken no previous anticoagulant or antiplatelet                            agents. ASA Grade Assessment: II - A patient with                            mild systemic disease. After reviewing the risks                            and benefits, the patient was deemed in  satisfactory condition to undergo the procedure.                           After obtaining informed consent, the colonoscope                            was passed under direct vision. Throughout the                            procedure, the patient's blood pressure, pulse, and                            oxygen saturations were monitored continuously. The                            PCF-HQ190L Colonoscope  was introduced through the                            anus and advanced to the 3 cm into the ileum. A                            second forward view of the right colon was                            performed. The colonoscopy was performed with                            moderate difficulty due to a redundant colon. The                            patient tolerated the procedure well. The quality                            of the bowel preparation was good. The terminal                            ileum, ileocecal valve, appendiceal orifice, and                            rectum were photographed. Scope In: 7:41:30 AM Scope Out: 7:58:14 AM Scope Withdrawal Time: 0 hours 13 minutes 1 second  Total Procedure Duration: 0 hours 16 minutes 44 seconds  Findings:                 The perianal and digital rectal examinations were                            normal.                           A 2 mm polyp was found in the transverse colon. The                            polyp was sessile. The polyp was removed with a  cold snare. Resection and retrieval were complete.                            Estimated blood loss was minimal.                           A 3 mm polyp was found in the splenic flexure. The                            polyp was sessile. The polyp was removed with a                            cold snare. Resection and retrieval were complete.                           Multiple small and large-mouthed diverticula were                            found in the sigmoid colon.                           The colon is redundant. The exam was otherwise                            without abnormality on direct and retroflexion                            views. Complications:            No immediate complications. Estimated Blood Loss:     Estimated blood loss: none. Impression:               - One 2 mm polyp in the transverse colon, removed                            with a  cold snare. Resected and retrieved.                           - One 3 mm polyp at the splenic flexure, removed                            with a cold snare. Resected and retrieved.                           - Diverticulosis in the sigmoid colon.                           - Redundant colon.                           - The examination was otherwise normal on direct                            and retroflexion views. Recommendation:           - Patient has  a contact number available for                            emergencies. The signs and symptoms of potential                            delayed complications were discussed with the                            patient. Return to normal activities tomorrow.                            Written discharge instructions were provided to the                            patient.                           - Continue present medications.                           - Consider daily use of Miralax 17g to prevent                            recurrent symptoms                           - Await pathology results.                           - Repeat colonoscopy date to be determined after                            pending pathology results are reviewed for                            surveillance.                           - Follow a high fiber diet. Drink at least 64                            ounces of water daily. Add a daily stool bulking                            agent such as psyllium (an exampled would be                            Metamucil).                           - Emerging evidence supports eating a diet of                            fruits, vegetables, grains, calcium, and yogurt  while reducing red meat and alcohol may reduce the                            risk of colon cancer.                           - Thank you for allowing me to be involved in your                            colon cancer prevention. Thornton Park MD, MD 04/26/2021 8:07:41 AM This report has been signed electronically.

## 2021-04-26 NOTE — Progress Notes (Signed)
Indication for procedure: abdominal pain and abnormal CT scan Please see 04/09/21 office visit for complete details. There has been no change in history or PE. She remains an appropriate candidate for monitored anesthesia care in the [facility].

## 2021-04-26 NOTE — Progress Notes (Signed)
Pt's states no medical or surgical changes since previsit or office visit.  VS CW  

## 2021-04-26 NOTE — Progress Notes (Signed)
A and O x3. Report to RN. Tolerated MAC anesthesia well. 

## 2021-04-26 NOTE — Patient Instructions (Signed)
Resume previous diet and medications. Consider using Miralax 17 G to prevent recurrent symptoms. Awaiting pathology results. Repeat Colonoscopy date to be determined based on pathology. Follow a high fiber diet.  YOU HAD AN ENDOSCOPIC PROCEDURE TODAY AT [facility]:   Refer to the procedure report that was given to you for any specific questions about what was found during the examination.  If the procedure report does not answer your questions, please call your gastroenterologist to clarify.  If you requested that your care partner not be given the details of your procedure findings, then the procedure report has been included in a sealed envelope for you to review at your convenience later.  YOU SHOULD EXPECT: Some feelings of bloating in the abdomen. Passage of more gas than usual.  Walking can help get rid of the air that was put into your GI tract during the procedure and reduce the bloating. If you had a lower endoscopy (such as a colonoscopy or flexible sigmoidoscopy) you may notice spotting of blood in your stool or on the toilet paper. If you underwent a bowel prep for your procedure, you may not have a normal bowel movement for a few days.  Please Note:  You might notice some irritation and congestion in your nose or some drainage.  This is from the oxygen used during your procedure.  There is no need for concern and it should clear up in a day or so.  SYMPTOMS TO REPORT IMMEDIATELY:  Following lower endoscopy (colonoscopy or flexible sigmoidoscopy):  Excessive amounts of blood in the stool  Significant tenderness or worsening of abdominal pains  Swelling of the abdomen that is new, acute  Fever of 100F or higher  For urgent or emergent issues, a gastroenterologist can be reached at any hour by calling [phone]. Do not use MyChart messaging for urgent concerns.    DIET:  We do recommend a small meal at first, but then you may proceed to your regular diet.   Drink plenty of fluids but you should avoid alcoholic beverages for 24 hours.  ACTIVITY:  You should plan to take it easy for the rest of today and you should NOT DRIVE or use heavy machinery until tomorrow (because of the sedation medicines used during the test).    FOLLOW UP: Our staff will call the number listed on your records 48-72 hours following your procedure to check on you and address any questions or concerns that you may have regarding the information given to you following your procedure. If we do not reach you, we will leave a message.  We will attempt to reach you two times.  During this call, we will ask if you have developed any symptoms of COVID 19. If you develop any symptoms (ie: fever, flu-like symptoms, shortness of breath, cough etc.) before then, please call [phone].  If you test positive for Covid 19 in the 2 weeks post procedure, please call and report this information to [place].    If any biopsies were taken you will be contacted by phone or by letter within the next 1-3 weeks.  Please call us at [phone] if you have not heard about the biopsies in 3 weeks.    SIGNATURES/CONFIDENTIALITY: You and/or your care partner have signed paperwork which will be entered into your electronic medical record.  These signatures attest to the fact that that the information above on your After Visit Summary has been reviewed and is understood.  Full responsibility of  the confidentiality of this discharge information lies with you and/or your care-partner.

## 2021-04-26 NOTE — Progress Notes (Signed)
Pt c/o of nausea during prep in AM, MD order for zofran given, see MAR, given at 722 AM

## 2021-04-26 NOTE — Progress Notes (Signed)
Called to room to assist during endoscopic procedure.  Patient ID and intended procedure confirmed with present staff. Received instructions for my participation in the procedure from the performing physician.  

## 2021-04-28 ENCOUNTER — Other Ambulatory Visit: Payer: Self-pay | Admitting: Gastroenterology

## 2021-04-28 ENCOUNTER — Telehealth: Payer: Self-pay

## 2021-04-28 ENCOUNTER — Telehealth: Payer: Self-pay | Admitting: Gastroenterology

## 2021-04-28 NOTE — Telephone Encounter (Signed)
°  Follow up Call-  Call back number 04/26/2021  Post procedure Call Back phone  # [phone]  Permission to leave phone message Yes  Some recent data might be hidden     Patient questions:  Do you have a fever, pain , or abdominal swelling? No. Pain Score  0 *  Have you tolerated food without any problems? Yes.    Have you been able to return to your normal activities? No.  Do you have any questions about your discharge instructions: Diet   no Medications  No. Follow up visit  No.  Do you have questions or concerns about your Care? Yes.  Called office bc had cramping, dr called In medication.  Actions: * If pain score is 4 or above: No action needed, pain <4.

## 2021-04-28 NOTE — Telephone Encounter (Signed)
Colonoscopy by [facility] on Monday with 2 small polyps removed by cold snare. Patient felt well without abdominal complaints until around midnight, 7 hours ago, when she developed mild to moderate generalized abdominal pain, bloating, "gas pain". She has not be able to belch or pass flatus. She tried warm liquids and walking without help. She has not had a BM since the bowel prep.  Advised warm liquids only until symptoms improve. Ducolax supp PR now and repeat in 4 hours if no results. Miralax now. Gas-X qid prn gas. Dicyclomine 10 mg po tid prn abdominal pain/bloating, #30. Call office if symptoms are not improving by noon today.

## 2021-04-29 ENCOUNTER — Encounter: Payer: Self-pay | Admitting: Gastroenterology

## 2021-04-29 DIAGNOSIS — M060A Rheumatoid arthritis without rheumatoid factor, other specified site: Secondary | ICD-10-CM | POA: Diagnosis not present

## 2021-04-29 DIAGNOSIS — E042 Nontoxic multinodular goiter: Secondary | ICD-10-CM | POA: Diagnosis not present

## 2021-04-29 DIAGNOSIS — K5730 Diverticulosis of large intestine without perforation or abscess without bleeding: Secondary | ICD-10-CM | POA: Diagnosis not present

## 2021-04-29 DIAGNOSIS — E785 Hyperlipidemia, unspecified: Secondary | ICD-10-CM | POA: Diagnosis not present

## 2021-04-29 DIAGNOSIS — M810 Age-related osteoporosis without current pathological fracture: Secondary | ICD-10-CM | POA: Diagnosis not present

## 2021-04-29 DIAGNOSIS — N200 Calculus of kidney: Secondary | ICD-10-CM | POA: Diagnosis not present

## 2021-04-29 DIAGNOSIS — I10 Essential (primary) hypertension: Secondary | ICD-10-CM | POA: Diagnosis not present

## 2021-04-29 DIAGNOSIS — E890 Postprocedural hypothyroidism: Secondary | ICD-10-CM | POA: Diagnosis not present

## 2021-04-29 DIAGNOSIS — J4540 Moderate persistent asthma, uncomplicated: Secondary | ICD-10-CM | POA: Diagnosis not present

## 2021-04-29 DIAGNOSIS — I2510 Atherosclerotic heart disease of native coronary artery without angina pectoris: Secondary | ICD-10-CM | POA: Diagnosis not present

## 2021-04-29 DIAGNOSIS — D126 Benign neoplasm of colon, unspecified: Secondary | ICD-10-CM | POA: Diagnosis not present

## 2021-04-29 DIAGNOSIS — I700 Atherosclerosis of aorta: Secondary | ICD-10-CM | POA: Diagnosis not present

## 2021-04-29 NOTE — Telephone Encounter (Signed)
Called pt to f/u on her symptoms. States her bloating and gas pain has improved although still feels like she needs to strain to defecate. States she is able to tolerate both solids and fluids. Noticed improvements in gas pain with use of Gas-X prn. Noticed improvements of abd discomfort with use of Dicyclomine prn and use of a heating pad. Confirms she is drinking at least 8-10 ounces of fluids every hour and is able to pass stool with use of Miralax daily, although does need to strain to fully remove stool. Provided the following recommendations:  Drink at least 64 ounces or more of fluids daily May increase Miralax to BID with goal of having 1 large soft stool daily WITHOUT the need to strain Add 1 dose of stool softener daily Continue Dicyclomine prn for abd discomfort Continue Gas-X prn for gas pain Continue heating pad prn for abd discomfort Will plan to follow up with pt tomorrow to evaluate effects of above mentioned recommendations  Verbalized acceptance and understanding.

## 2021-04-30 NOTE — Telephone Encounter (Signed)
Called pt and informed of Dr. Dayle Points response below. Verbalized acceptance and understanding.

## 2021-04-30 NOTE — Telephone Encounter (Signed)
Called pt to f/u. States she has followed the recommendations as detailed below. States she is still having abd pain and bloating. Denies nausea or vomiting. Went to the [place] this morning and did defecate although states "it was only the size of my pinky finger". Further added that she did need to strain to fully defecate. Asked about her dietary intake. States she ate 1 slice of whole wheat toast with peanut butter, ate an apple and drank some coffee yesterday. Further added, for lunch yesterday she ate half of her chinese dinner that contained rice with vegetables, then finished it for dinner. This morning she has only had her coffee with 1 dose of Miralax dissolved in her coffee. Routing this message to DOD, Dr. Candis Schatz to determine if he would like to order a KUB. Will await his response.

## 2021-05-13 DIAGNOSIS — M0579 Rheumatoid arthritis with rheumatoid factor of multiple sites without organ or systems involvement: Secondary | ICD-10-CM | POA: Diagnosis not present

## 2021-06-01 ENCOUNTER — Encounter: Payer: Self-pay | Admitting: Internal Medicine

## 2021-06-01 ENCOUNTER — Other Ambulatory Visit: Payer: Self-pay

## 2021-06-01 ENCOUNTER — Telehealth: Payer: Self-pay | Admitting: Internal Medicine

## 2021-06-01 ENCOUNTER — Ambulatory Visit (INDEPENDENT_AMBULATORY_CARE_PROVIDER_SITE_OTHER): Payer: Medicare Other | Admitting: Internal Medicine

## 2021-06-01 VITALS — BP 124/68 | HR 69 | Temp 98.3°F | Ht 63.0 in | Wt 151.8 lb

## 2021-06-01 DIAGNOSIS — R0789 Other chest pain: Secondary | ICD-10-CM | POA: Diagnosis not present

## 2021-06-01 DIAGNOSIS — J209 Acute bronchitis, unspecified: Secondary | ICD-10-CM

## 2021-06-01 DIAGNOSIS — J4541 Moderate persistent asthma with (acute) exacerbation: Secondary | ICD-10-CM

## 2021-06-01 LAB — TROPONIN I (HIGH SENSITIVITY): High Sens Troponin I: 4 ng/L (ref 2–17)

## 2021-06-01 MED ORDER — DOXYCYCLINE HYCLATE 100 MG PO TABS: 100.0000 mg | ORAL_TABLET | Freq: Two times a day (BID) | ORAL | 0 refills | Status: DC

## 2021-06-01 MED ORDER — METHYLPREDNISOLONE ACETATE 80 MG/ML IJ SUSP
80.0000 mg | Freq: Once | INTRAMUSCULAR | Status: AC
  Administered 2021-06-01: 80 mg via INTRAMUSCULAR

## 2021-06-01 MED ORDER — ALBUTEROL SULFATE HFA 108 (90 BASE) MCG/ACT IN AERS: 1.0000 | INHALATION_SPRAY | Freq: Four times a day (QID) | RESPIRATORY_TRACT | 5 refills | Status: DC | PRN

## 2021-06-01 NOTE — Patient Instructions (Addendum)
ICD-10-CM   ?1. Moderate persistent asthma with acute exacerbation  J45.41 methylPREDNISolone acetate (DEPO-MEDROL) injection 80 mg  ?  ?2. Acute bronchitis, unspecified organism  J20.9   ?  ?3. Other chest pain  R07.89   ?  ? ?Acute bronchitis with asthma flare up - cause could be metapneumovirus ?Chest pain 0likely due to cough ? ?Plan ?- check troponin 06/01/2021 to be on safe side ? - Continue breo and singulair scheduled  ?- Use albuterol as needed ? - cma to do drefill ?- Take doxycycline '100mg'$  po twice daily x 5 days; take after meals and avoid sunlight ?- depot-medrol '80mg'$  IM x 1 (in lieu of prednisone taper based on shared decision making) ? ? ?Follow-up ?- Return in 6 months or sooner if needed  ?-check ACT score and also FeNO at follow-up ?

## 2021-06-01 NOTE — Telephone Encounter (Signed)
Spoke with the pt and scheduled acute visit with MR today- c/o cough with green sputum, chest tightness, no fever.  ?

## 2021-06-01 NOTE — Progress Notes (Signed)
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 Wilson Singer, intermittent chest tightness, no wheezing or cough   wheezng started roughly the same year she quit smoking assoc with wt gain x 35lb wt gain typically better p prednisone and better since spring 2017  On asthmanex 2 each am with rare saba needed but then Feb 10 2016 chest tightness and did not try saba > ER 02/21/16 with w/u neg w/u except for a/f levels both max sinuses> Rx Rosen= prednsione > back to baseline at time of ov with no cough or sob or chest tightness and  Not limited by breathing from desired activities including treadmill at 3.5 mph flat x 1.5 miles  Other than assoc of flares with cold/sinus infections >> No obvious patterns in day to day or daytime variability or assoc excess/ purulent sputum or mucus plugs or hemoptysis or cp or chest tightness, subjective wheeze or overt   hb symptoms. No unusual exp hx or h/o childhood pna/ asthma or knowledge of premature birth. 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  No new complaints. 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 She has not had to use her albuterol inhaler at all and has only been using the symbicort 1-2 puffs every am.   symb 80 1-2 each am at most never using the 2 bid max  Not limited by breathing from desired activities   No cough   No obvious day to day or  daytime variability or assoc excess/ purulent sputum or mucus plugs or hemoptysis or cp or chest tightness, subjective wheeze or overt sinus or hb symptoms. No unusual exp hx or h/o childhood pna/ asthma or knowledge of premature birth.  Sleeping ok flat without nocturnal  or early am exacerbation  of respiratory  c/o's or need for noct saba. Also denies any obvious fluctuation of symptoms with weather or environmental changes or other aggravating or alleviating factors except as outlined above   Current Allergies, Complete Past Medical History, Past Surgical History, Family History, and Social History were reviewed in [employer] record.  ROS  The following are not active complaints unless bolded Hoarseness, sore throat, dysphagia, dental problems, itching, sneezing,  nasal congestion or discharge of excess mucus or purulent secretions,  ear ache,   fever, chills, sweats, unintended wt loss or wt gain, classically pleuritic or exertional cp,  orthopnea pnd or leg swelling, presyncope, palpitations, abdominal pain, anorexia, nausea, vomiting, diarrhea  or change in bowel habits or change in bladder habits, change in stools or change in urine, dysuria, hematuria,  rash, arthralgias, visual complaints, headache, numbness, weakness or ataxia or problems with walking or coordination,  change in mood/affect or memory.       OV 08/29/2019  Subjective:  Patient ID: Brandy Townsend, female , DOB: 06/30/1950 , age 6 y.o. , MRN: [id number] , ADDRESS: [place] [address]   08/29/2019 -   Chief Complaint  Patient presents with   Consult    Abnormal CT, hx of asthma. hx of low oxygen level and cough.    Transfer of care from Dr. [name] to Dr. [name].  HPI Brandy Townsend 71 y.o. -referred for possible abnormalities in the CT scan of the chest in April 2021 in the setting of rheumatoid arthritis 2018 (RF + and CCP > 250 per [name] over phone) and being on [place] and longstanding history of allergic asthma on dulera. 20 pack former smoking   71 year old female.  Former Radio producer.  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  She reports a many year many decade history of allergic asthma which flares up in the fall.  Between episodes she is not on any maintenance treatment.  In the fall when it flares up her previous primary care physician Dr. [name] retired] would give her antibiotics and prednisone one course and this would help it.  However in 2020 when the flareup happened she could not get access to the prednisone and antibiotics because the primary care physician retired and in the setting of Lydia it was televisit.  By the end of 2020 she ended up with 2 rounds of antibiotics and prednisone that helped clear things up.  In the interim in the last 4 years she has had a diagnosis of  rheumatoid arthritis.  She was on methotrexate but had raised liver function test.  This seemed to help.  She failed Plaquenil and then was started on leflunomide which she is on currently.   In 2018 she did have spirometry and DLCO on pulmonary function testing that shows hyperinflation and air trapping with reduced DLCO but also normal spirometry.  In the backdrop of this in January 2020 when she underwent her first Covid vaccine.  Then in early February 2020 when she had a second Covid vaccine.  24 hours later she got hypotensive tachycardic and also hypoxemic and ended up in the ER.  I reviewed the  ER notes.  She was observed in the ER for whole day and her symptoms and signs resolved and the hypoxemia also resolved.  She was discharged with a prednisone course.  She says subsequent to that she was short of breath and fatigue for 2 weeks and this also resolved.  She did see cardiology in February 2021.  She was reassured by a normal stress test.  After that she switched primary care physicians to Dr. Reynold Bowen.  She is now established with a new rheumatologist at [place] and is seen [name] the 52 assistant for the first time on August 27, 2019 2 days ago.  She also had a thyroid biopsy which was nondiagnostic.  On the rheumatology visit 2 days ago it was felt her rheumatoid arthritis was active.  Blood has been drawn and work-up is in progress.  Through all this primary care physician did do CT chest without contrast.  I personally visualized this.  There is evidence of tree-in-bud that is mild and subtle.  Therefore she is referred here.  However at this point in time she is asymptomatic from a respiratory standpoint no wheezing no orthopnea no proximal nocturnal dyspnea no dyspnea on exertion no cough.  However in terms of rheumatoid arthritis there is plans to increase her immunomodulation. She has been started on low dose pred now by [name]. Currently labs  pending are CRP, Quant Gold and Hep Panel   D/w [name] later -> she is considering adding DMARD -discussed and she is supportive of getting bronch and rule out opportunistic infection  ROS - per HPI  IMPRESSION: CT chest visualized 1. Very mild, stable tree-in-bud appearing opacities along the anterolateral aspect of the bilateral upper lobes. Sequelae associated with atypical infection cannot be excluded. 2. Predominant stable thyroid goiter.   Aortic Atherosclerosis (ICD10-I70.0).     Electronically Signed   By: Virgina Norfolk M.D.   On: 07/22/2019 22:34    OV 10/17/2019  Subjective:  Patient ID: Brandy Townsend, female , DOB: May 21, 1950 , age 71 y.o. , MRN: [id number] , ADDRESS: [place] [id number]   10/17/2019 -   Chief Complaint  Patient presents with   Follow-up    Pt is here to discuss results of the bronch. Pt states she has been doing good since last visit and denies any complaints.     HPI NIANA MARTORANA 71 y.o. -returns for follow-up.  At this point in time she is on prednisone 20 mg/day.  She has gained weight.  She is got some bruises.  She is not happy about her prednisone.  She denies any respiratory complaints.  Mid June 2020 when she underwent bronchoscopy with lavage.  I reviewed the results.  She is got some mild neutrophilia but organisms such as PCP and TB and fungus are negative.  She really wants to get started on a DMA RD so she can come down or get off prednisone.  I discussed with rheumatology PA [name] and she is of the understanding.  I stated there is no ILD.  They are going to focus on joint treatment.  Patient did indicate that in the fall she is at risk for respiratory exacerbations.  But she lives nearby and is agreeable to a 9-53-monthfollow-up.  She is supposed to get pulmonary function test today but that was not scheduled by our office.  Results for HNASHYA, GARLINGTON(MRN [id number] as of 10/17/2019 16:38  Ref.  Range 09/12/2019 08:59  Monocyte-Macrophage-Serous Fluid Latest Ref Range: 50 - 90 % 38 (L)  Other Cells, Fluid Latest Units: % CORRELATE WITH CYTOLOGY.  Fluid Type-FCT Unknown Bronch Lavag  Color, Fluid Latest Ref Range: YELLOW  COLORLESS (A)  Total Nucleated Cell Count, Fluid Latest Ref Range: 0 - 1,000 cu mm 15  Lymphs, Fluid Latest Units: % 4  Eos, Fluid Latest Units: % 2  Appearance, Fluid Latest Ref Range: CLEAR  CLOUDY (A)  Neutrophil Count, Fluid Latest Ref Range: 0 - 25 % 56 (H)   ROS - per HPI   Patient ID: Brandy Townsend, female    DOB: 1950/04/21, 71 y.o.   MRN: [id number]  Chief Complaint  Patient presents with   Follow-up    Cough     Referring provider: Reynold Bowen, MD    12/10/2019 Acute OV : Cough  HPI: 71 year old female former smoker followed for abnormal CT chest with pulmonary infiltrates Medical history significant for rheumatoid arthritis    Patient presents for an acute office visit.  She complains of 1 week increased cough,congestion , and wheezing . Mucus is clear , hard to cough anything up.  She has underlying rheumatoid arthritis and is recently had her maintenance regimen changed to methotrexate in infliximab.    She had recent Covid test has been negative.  Her Covid vaccines are up-to-date.  She denies any fever or loss of taste or smell.   Patient is being followed for mild pulmonary infiltrates noted on CT chest.  She is negative for interstitial lung disease.  She underwent bronchoscopy in June 2021 that showed negative cultures and BAL.  Patient is followed by rheumatology and is on methotrexate and Renflexis     TEST/EVENTS :  In 2018 she did have spirometry and DLCO on pulmonary function testing that shows hyperinflation and air trapping with reduced DLCO but also normal spirometry.  CT chest July 22, 2019 showed very mild stable tree-in-bud appearing opacities along the bilateral upper lobes.  Bronchoscopy June 2021 showed cytology  positive for benign bronchial cells and pulmonary macrophages.,  AFB negative, BAL negative, M tubercular goes complex negative, fungal negative PCP negative   OV 01/23/2020  Subjective:  Patient ID: Brandy Townsend, female , DOB: 04/30/1950 , age 70 y.o. , MRN: [id number] , ADDRESS: [place] [phone] PCP Reynold Bowen, MD Patient Care Team: Reynold Bowen, MD as PCP - General (Endocrinology) Vania Rea, MD as PCP - OBGYN (Obstetrics and Gynecology) Josue Hector, MD as PCP - Cardiology (Cardiology)  This Provider for this visit: Treatment Team:  Attending Provider: Brand Males, MD    01/23/2020 -   Chief Complaint  Patient presents with   Follow-up    Pt states she has been doing okay since last visit. States her breathing has been doing okay. Still becomes SOB but it happens out of nowhere and she isn't sure why or if it is coming due to her heart.   Rheumatoid arthritis with immunosuppression and mild pulmonary infiltrates.  BAL with neutrophilia blood culture negative-June 2021  Long history of allergic asthma previously on Dulera.  HPI MAKARA LANZO 71 y.o. -returns for follow-up.  Since last seeing me she is seen nurse practitioner in September 2021.  At that time she was wheezing.  She was given Depo-Medrol and outpatient Omnicef.  She says within a few days she responded to this.  Since then she feels back to baseline but she says  even at baseline she is short of breath with exertion relieved by rest.  She says is not normal for her.  She recently had a cardiac cath and she reports this is normal.  I reviewed the chart and agree with that.  She is also dealing with her husband has esophageal cancer.  Recently was in the ER because of' false Hyperkalemia according to history.  She is currently on TNF alpha blockade and methotrexate for her rheumatoid arthritis.  She says the joint symptoms have not fully improved.  She is waiting for the third  dose of the TNF alpha blockade before she is expecting to show improvement in her symptoms.  Overall the last few months have been high level of social stress.  She thinks it is slowly settling down.  She feels like things are changed after getting her Covid vaccine.  Nevertheless she understands she is immunosuppressed and she is in need of a booster.  She has some trepidation about getting the booster.  She is willing to have a IgG antibody levels checked as a means of triage her understanding antibody response to previous Covid vaccine.  Today she pulmonary function test in my view shows obstruction with significant bronchodilator response consistent with an asthma pattern.  Documented below.  I reviewed the graph.  Nitric oxide test and asthma control test scores are listed below and shows disease activity   Asthma Control Test ACT Total Score  01/23/2020 18  08/29/2019 24     Lab Results  Component Value Date   NITRICOXIDE 34 01/23/2020      OV 03/04/2021  Subjective:  Patient ID: Brandy Townsend, female , DOB: October 07, 1950 , age 37 y.o. , MRN: [id number] , ADDRESS: [place] [id number] PCP Reynold Bowen, MD Patient Care Team: Reynold Bowen, MD as PCP - General (Endocrinology) Vania Rea, MD as PCP - OBGYN (Obstetrics and Gynecology) Josue Hector, MD as PCP - Cardiology (Cardiology)  This Provider for this visit: Treatment Team:  Attending Provider: Brand Males, MD    03/04/2021 -   Chief Complaint  Patient presents with   Follow-up    No new concerns     HPI ALYRICA THUROW 71 y.o. -returns for follow-up.  She tells me overall her asthma is doing well.  In September/October 2022 Dr. Arville Care added Singulair.  She was referred to allergist.  Work-up there has been reassuring.  She would not plan to go back to allergy clinic again.  She saw Dr. Donneta Romberg.  She feels the [facility] and Singulair have helped her.  She wants a refill on her albuterol.   Her ACT control score shows improvement.  However her exhaled nitric oxide score is 55 and is borderline high.  However she is not feeling it.  Of note she had a fourth COVID mRNA booster vaccine against delta and oh micron.  However 2 days after that she got significant side effects of joint flare and like fatigue.  She does not want to do the mRNA vaccine again.  This is second time she has had significant side effects.  She needed prednisone this time.  After the second shot she ended up in the ER with tachycardia.  I have advised her that mRNA vaccine should be listed as allergy.  She is agreeable.  Asthma Control Test ACT Total Score  03/04/2021 24  09/15/2020 18  03/11/2020 24     Lab Results  Component Value Date  NITRICOXIDE 34 01/23/2020   .      OV 06/01/2021  Subjective:  Patient ID: Brandy Townsend, female , DOB: February 10, 1951 , age 71 y.o. , MRN: [id number] , ADDRESS: [place] [id number] PCP Reynold Bowen, MD Patient Care Team: Reynold Bowen, MD as PCP - General (Endocrinology) Vania Rea, MD as PCP - OBGYN (Obstetrics and Gynecology) Josue Hector, MD as PCP - Cardiology (Cardiology)  This Provider for this visit: Treatment Team:  Attending Provider: Brand Males, MD  Rheumatoid arthritis with immunosuppression and mild pulmonary infiltrates.  BAL with neutrophilia blood culture negative-June 2021  Long history of allergic asthma previously on Dulera.  06/01/2021 -   Chief Complaint  Patient presents with   Acute Visit    Pt has had complaints of cough and chest tightness which began overnight 3/6.    Rheumatoid arthritis with immunosuppression and mild pulmonary infiltrates.  BAL with neutrophilia blood culture negative-June 2021  Long history of allergic asthma HPI AUDREY THULL 71 y.o. -this is an acute visit.  She tells me that a few weeks ago she had cough and it went away.  Then she went to [place].  She has returned and  has been doing well.  No sick contacts.  Then all of a sudden she woke up in the middle of the night with chest pain.  It was as though there was some sharp sensation in the sternal area.  She reports history of normal stress test within a year.  She did have normal troponins in 2022 and 2021.  Review of the records indicate low risk cardiac stress test February 2021.  Cardiac cath in October 2021 showed nonobstructive coronary artery disease with moderate disease in the first OM.  But in any event later she started having significant cough with green sputum.  She is also short of breath and fatigue.  The chest pain was associated with the cough.  Currently CBC shows greater than 10% isolates for human metapneumovirus is positive.  This is known to cause asthma exacerbation.  Her maintenance medication for asthma includes Breo and Singulair  She continues to deal with easy bruising on her forearms and arms that are pressure related.  She says it is idiopathic.  She believes her platelets are normal.   Asthma Control Test ACT Total Score  03/04/2021 24  09/15/2020 18  03/11/2020 24     Lab Results  Component Value Date   NITRICOXIDE 34 01/23/2020       PFT  PFT Results Latest Ref Rng & Units 01/23/2020 01/13/2017  FVC-Pre L 2.08 2.43  FVC-Predicted Pre % 69 79  FVC-Post L 2.41 2.46  FVC-Predicted Post % 80 80  Pre FEV1/FVC % % 72 73  Post FEV1/FCV % % 75 76  FEV1-Pre L 1.51 1.77  FEV1-Predicted Pre % 66 75  FEV1-Post L 1.81 1.87  DLCO uncorrected ml/min/mmHg 18.49 16.68  DLCO UNC% % 97 72  DLCO corrected ml/min/mmHg 18.38 17.68  DLCO COR %Predicted % 96 77  DLVA Predicted % 108 83  TLC L 5.38 5.90  TLC % Predicted % 109 120  RV % Predicted % 142 162       has a past medical history of Asthma, CAD (coronary artery disease), Diverticulosis, Graves disease, High cholesterol, Hypertension, Hypothyroidism, Nephrolithiasis, Osteoporosis, Rheumatoid arthritis ([place]), and Vertigo.    reports that she quit smoking about 12 years ago. Her smoking use included cigarettes. She has a 20.00 pack-year  smoking history. She has never used smokeless tobacco.  Past Surgical History:  Procedure Laterality Date   BRONCHIAL WASHINGS  09/12/2019   Procedure: BRONCHIAL WASHINGS;  Surgeon: Brand Males, MD;  Location: [facility];  Service: Endoscopy;;   INTRAVASCULAR PRESSURE WIRE/FFR STUDY N/A 01/08/2020   Procedure: INTRAVASCULAR PRESSURE WIRE/FFR STUDY;  Surgeon: Martinique, Peter M, MD;  Location: [facility];  Service: Cardiovascular;  Laterality: N/A;   LEFT HEART CATH AND CORONARY ANGIOGRAPHY N/A 01/08/2020   Procedure: LEFT HEART CATH AND CORONARY ANGIOGRAPHY;  Surgeon: Martinique, Peter M, MD;  Location: [facility];  Service: Cardiovascular;  Laterality: N/A;   VIDEO BRONCHOSCOPY N/A 09/12/2019   Procedure: VIDEO BRONCHOSCOPY WITHOUT FLUORO;  Surgeon: Brand Males, MD;  Location: [facility];  Service: Endoscopy;  Laterality: N/A;    Allergies  Allergen Reactions   Cat Hair Extract     Other reaction(s): congestion   Dust Mite Extract     Other reaction(s): Unknown   Morphine And Related Nausea And Vomiting   Macrobid [Nitrofurantoin Monohyd Macro] Other (See Comments)    Lowered potassium, caused aches and pains    Immunization History  Administered Date(s) Administered   Fluad Quad(high Dose 65+) 01/20/2020   Influenza Whole 01/04/2017   Influenza, High Dose Seasonal PF 01/03/2017, 12/27/2018, 03/03/2021   Influenza, Quadrivalent, Recombinant, Inj, Pf 01/09/2018, 01/11/2019   Influenza,inj,quad, With Preservative 12/26/2016   Influenza-Unspecified 12/26/2013, 12/27/2014, 12/27/2015   PFIZER(Purple Top)SARS-COV-2 Vaccination 04/17/2019, 05/06/2019, 01/31/2020   Pfizer Covid-19 Vaccine Bivalent Booster 48yr & up 01/22/2021   Pneumococcal Conjugate-13 04/28/2014, 05/26/2014   Pneumococcal Polysaccharide-23 03/03/2021   Zoster Recombinat (Shingrix)  01/14/2019, 04/02/2019   Zoster, Live 05/22/2013, 04/01/2019    Family History  Problem Relation Age of Onset   Breast cancer Mother    Heart disease Father    Cancer Daughter 325      stage 3 breast      Current Outpatient Medications:    abatacept (ORENCIA) 250 MG injection, See admin instructions., Disp: , Rfl:    aspirin EC 81 MG tablet, Take 1 tablet (81 mg total) by mouth daily. Swallow whole., Disp: 90 tablet, Rfl: 3   BREO ELLIPTA 100-25 MCG/INH AEPB, TAKE 1 PUFF BY MOUTH EVERY DAY, Disp: 60 each, Rfl: 5   cetirizine (ZYRTEC) 10 MG tablet, Take 10 mg by mouth at bedtime as needed for allergies. , Disp: , Rfl:    doxycycline (VIBRA-TABS) 100 MG tablet, Take 1 tablet (100 mg total) by mouth 2 (two) times daily., Disp: 10 tablet, Rfl: 0   KLOR-CON 10 10 MEQ tablet, Take 10 mEq by mouth daily., Disp: , Rfl: 0   levothyroxine (SYNTHROID) 50 MCG tablet, Take 50 mcg by mouth daily. , Disp: , Rfl:    montelukast (SINGULAIR) 10 MG tablet, Take 10 mg by mouth daily., Disp: , Rfl:    nitroGLYCERIN (NITROSTAT) 0.4 MG SL tablet, Place 1 tablet (0.4 mg total) under the tongue every 5 (five) minutes as needed for chest pain., Disp: 25 tablet, Rfl: 3   Probiotic Product (PROBIOTIC GUMMIES PO), Take by mouth., Disp: , Rfl:    pyridOXINE (VITAMIN B-6) 100 MG tablet, Take 100 mg by mouth daily., Disp: , Rfl:    rosuvastatin (CRESTOR) 10 MG tablet, Take 1 tablet (10 mg total) by mouth daily., Disp: 90 tablet, Rfl: 3   torsemide (DEMADEX) 20 MG tablet, Take 1 tablet (20 mg total) by mouth daily., Disp: 90 tablet, Rfl: 3   verapamil (CALAN-SR)  240 MG CR tablet, Take 1 tablet (240 mg total) by mouth daily., Disp: 90 tablet, Rfl: 3   Vitamin D, Ergocalciferol, (DRISDOL) 1.25 MG (50000 UNIT) CAPS capsule, Take 50,000 Units by mouth every 7 (seven) days. Monday, Disp: , Rfl:    albuterol (VENTOLIN HFA) 108 (90 Base) MCG/ACT inhaler, Inhale 1-2 puffs into the lungs every 6 (six) hours as needed for  wheezing or shortness of breath., Disp: 8 g, Rfl: 5   folic acid (FOLVITE) 1 MG tablet, Take 3 mg by mouth daily. Only takes on days she takes methotrexate (Patient not taking: Reported on 06/01/2021), Disp: , Rfl:   Current Facility-Administered Medications:    methylPREDNISolone acetate (DEPO-MEDROL) injection 80 mg, 80 mg, Intramuscular, Once, Brand Males, MD      Objective:   Vitals:   06/01/21 1410  BP: 124/68  Pulse: 69  Temp: 98.3 F (36.8 C)  TempSrc: Oral  SpO2: 97%  Weight: 151 lb 13.6 oz (68.9 kg)  Height: _0  (1.6 m)    Estimated body mass index is 26.9 kg/m as calculated from the following:   Height as of this encounter: _1  (1.6 m).   Weight as of this encounter: 151 lb 13.6 oz (68.9 kg).  _2 @  Filed Weights   06/01/21 1410  Weight: 151 lb 13.6 oz (68.9 kg)     Physical Exam General: No distress. Looks well Neuro: Alert and Oriented x 3. GCS 15. Speech normal Psych: Pleasant Resp:  Barrel Chest - no.  Wheeze - no, Crackles - no, No overt respiratory distress.  Coarse breath sounds CVS: Normal heart sounds. Murmurs - no Ext: Stigmata of Connective Tissue Disease - no HEENT: Normal upper airway. PEERL +. No post nasal drip        Assessment:       ICD-10-CM   1. Moderate persistent asthma with acute exacerbation  J45.41 methylPREDNISolone acetate (DEPO-MEDROL) injection 80 mg    2. Acute bronchitis, unspecified organism  J20.9     3. Other chest pain  R07.89 Troponin I (High Sensitivity)     She does not want to do prednisone    Plan:     Patient Instructions     ICD-10-CM   1. Moderate persistent asthma with acute exacerbation  J45.41 methylPREDNISolone acetate (DEPO-MEDROL) injection 80 mg    2. Acute bronchitis, unspecified organism  J20.9     3. Other chest pain  R07.89      Acute bronchitis with asthma flare up - cause could be metapneumovirus Chest pain 0likely due to cough  Plan - check troponin 06/01/2021  to be on safe side  - Continue breo and singulair scheduled  - Use albuterol as needed  - cma to do drefill - Take doxycycline 159m po twice daily x 5 days; take after meals and avoid sunlight - depot-medrol 874mIM x 1 (in lieu of prednisone taper based on shared decision making)   Follow-up - Return in 6 months or sooner if needed  -check ACT score and also FeNO at follow-up    SIGNATURE    Dr. MuBrand MalesM.D., F.C.C.P,  Pulmonary and Critical Care Medicine Staff Physician, [place] - Interstitial Lung Disease  Program  Pulmonary FiBarryt [id number]: [phone] no answer or between  15:00h - 7:00h: call [phone] Telephone: [phone]  2:37 PM 06/01/2021

## 2021-06-14 DIAGNOSIS — M0579 Rheumatoid arthritis with rheumatoid factor of multiple sites without organ or systems involvement: Secondary | ICD-10-CM | POA: Diagnosis not present

## 2021-06-28 DIAGNOSIS — R300 Dysuria: Secondary | ICD-10-CM | POA: Diagnosis not present

## 2021-07-15 DIAGNOSIS — M0579 Rheumatoid arthritis with rheumatoid factor of multiple sites without organ or systems involvement: Secondary | ICD-10-CM | POA: Diagnosis not present

## 2021-07-15 DIAGNOSIS — Z79899 Other long term (current) drug therapy: Secondary | ICD-10-CM | POA: Diagnosis not present

## 2021-07-19 DIAGNOSIS — Z79899 Other long term (current) drug therapy: Secondary | ICD-10-CM | POA: Diagnosis not present

## 2021-07-19 DIAGNOSIS — E663 Overweight: Secondary | ICD-10-CM | POA: Diagnosis not present

## 2021-07-19 DIAGNOSIS — R9389 Abnormal findings on diagnostic imaging of other specified body structures: Secondary | ICD-10-CM | POA: Diagnosis not present

## 2021-07-19 DIAGNOSIS — M0579 Rheumatoid arthritis with rheumatoid factor of multiple sites without organ or systems involvement: Secondary | ICD-10-CM | POA: Diagnosis not present

## 2021-07-19 DIAGNOSIS — M810 Age-related osteoporosis without current pathological fracture: Secondary | ICD-10-CM | POA: Diagnosis not present

## 2021-07-19 DIAGNOSIS — Z6826 Body mass index (BMI) 26.0-26.9, adult: Secondary | ICD-10-CM | POA: Diagnosis not present

## 2021-07-22 DIAGNOSIS — Z1231 Encounter for screening mammogram for malignant neoplasm of breast: Secondary | ICD-10-CM | POA: Diagnosis not present

## 2021-07-24 ENCOUNTER — Emergency Department (HOSPITAL_COMMUNITY): Payer: Medicare Other

## 2021-07-24 ENCOUNTER — Emergency Department (HOSPITAL_COMMUNITY)
Admission: EM | Admit: 2021-07-24 | Discharge: 2021-07-24 | Disposition: A | Discharge status: Home / Self Care | Payer: Medicare Other | Attending: Emergency Medicine | Admitting: Emergency Medicine

## 2021-07-24 ENCOUNTER — Other Ambulatory Visit: Payer: Self-pay

## 2021-07-24 ENCOUNTER — Encounter (HOSPITAL_COMMUNITY): Payer: Self-pay | Admitting: Emergency Medicine

## 2021-07-24 DIAGNOSIS — Z79899 Other long term (current) drug therapy: Secondary | ICD-10-CM | POA: Insufficient documentation

## 2021-07-24 DIAGNOSIS — Z7982 Long term (current) use of aspirin: Secondary | ICD-10-CM | POA: Insufficient documentation

## 2021-07-24 DIAGNOSIS — I700 Atherosclerosis of aorta: Secondary | ICD-10-CM | POA: Diagnosis not present

## 2021-07-24 DIAGNOSIS — R110 Nausea: Secondary | ICD-10-CM | POA: Insufficient documentation

## 2021-07-24 DIAGNOSIS — R0789 Other chest pain: Secondary | ICD-10-CM | POA: Diagnosis not present

## 2021-07-24 DIAGNOSIS — K5730 Diverticulosis of large intestine without perforation or abscess without bleeding: Secondary | ICD-10-CM | POA: Diagnosis not present

## 2021-07-24 DIAGNOSIS — R0602 Shortness of breath: Secondary | ICD-10-CM | POA: Diagnosis not present

## 2021-07-24 DIAGNOSIS — R1010 Upper abdominal pain, unspecified: Secondary | ICD-10-CM | POA: Diagnosis not present

## 2021-07-24 DIAGNOSIS — G4489 Other headache syndrome: Secondary | ICD-10-CM | POA: Diagnosis not present

## 2021-07-24 DIAGNOSIS — R079 Chest pain, unspecified: Secondary | ICD-10-CM | POA: Diagnosis not present

## 2021-07-24 DIAGNOSIS — R109 Unspecified abdominal pain: Secondary | ICD-10-CM | POA: Insufficient documentation

## 2021-07-24 DIAGNOSIS — R42 Dizziness and giddiness: Secondary | ICD-10-CM | POA: Diagnosis not present

## 2021-07-24 LAB — CBC
HCT: 44.2 % (ref 36.0–46.0)
Hemoglobin: 14.7 g/dL (ref 12.0–15.0)
MCH: 30.2 pg (ref 26.0–34.0)
MCHC: 33.3 g/dL (ref 30.0–36.0)
MCV: 90.8 fL (ref 80.0–100.0)
Platelets: 304 10*3/uL (ref 150–400)
RBC: 4.87 MIL/uL (ref 3.87–5.11)
RDW: 13.2 % (ref 11.5–15.5)
WBC: 7 10*3/uL (ref 4.0–10.5)
nRBC: 0 % (ref 0.0–0.2)

## 2021-07-24 LAB — TROPONIN I (HIGH SENSITIVITY)
Troponin I (High Sensitivity): 3 ng/L (ref <18)
Troponin I (High Sensitivity): <2 ng/L (ref <18)

## 2021-07-24 LAB — HEPATIC FUNCTION PANEL
ALT: 23 U/L (ref 0–44)
AST: 17 U/L (ref 15–41)
Albumin: 3.9 g/dL (ref 3.5–5.0)
Alkaline Phosphatase: 92 U/L (ref 38–126)
Bilirubin, Direct: 0.1 mg/dL (ref 0.0–0.2)
Indirect Bilirubin: 0.6 mg/dL (ref 0.3–0.9)
Total Bilirubin: 0.7 mg/dL (ref 0.3–1.2)
Total Protein: 7 g/dL (ref 6.5–8.1)

## 2021-07-24 LAB — BASIC METABOLIC PANEL
Anion gap: 7 (ref 5–15)
BUN: 12 mg/dL (ref 8–23)
CO2: 27 mmol/L (ref 22–32)
Calcium: 9.4 mg/dL (ref 8.9–10.3)
Chloride: 107 mmol/L (ref 98–111)
Creatinine, Ser: 0.89 mg/dL (ref 0.44–1.00)
GFR, Estimated: >60 mL/min (ref >60)
Glucose, Bld: 110 mg/dL — ABNORMAL HIGH (ref 70–99)
Potassium: 3.5 mmol/L (ref 3.5–5.1)
Sodium: 141 mmol/L (ref 135–145)

## 2021-07-24 LAB — CBG MONITORING, ED: Glucose-Capillary: 89 mg/dL (ref 70–99)

## 2021-07-24 LAB — LIPASE, BLOOD: Lipase: 31 U/L (ref 11–51)

## 2021-07-24 MED ORDER — ONDANSETRON HCL 4 MG/2ML IJ SOLN
4.0000 mg | Freq: Once | INTRAMUSCULAR | Status: AC
  Administered 2021-07-24: 4 mg via INTRAVENOUS
  Filled 2021-07-24: qty 2

## 2021-07-24 MED ORDER — LACTATED RINGERS IV BOLUS
500.0000 mL | Freq: Once | INTRAVENOUS | Status: AC
  Administered 2021-07-24: 500 mL via INTRAVENOUS

## 2021-07-24 MED ORDER — FENTANYL CITRATE PF 50 MCG/ML IJ SOSY
25.0000 ug | PREFILLED_SYRINGE | Freq: Once | INTRAMUSCULAR | Status: AC
  Administered 2021-07-24: 25 ug via INTRAVENOUS
  Filled 2021-07-24: qty 1

## 2021-07-24 MED ORDER — IOHEXOL 350 MG/ML SOLN
100.0000 mL | Freq: Once | INTRAVENOUS | Status: AC | PRN
  Administered 2021-07-24: 100 mL via INTRAVENOUS

## 2021-07-24 MED ORDER — HYDROCODONE-ACETAMINOPHEN 5-325 MG PO TABS
2.0000 | ORAL_TABLET | Freq: Once | ORAL | Status: AC
  Administered 2021-07-24: 2 via ORAL
  Filled 2021-07-24: qty 2

## 2021-07-24 MED ORDER — PANTOPRAZOLE SODIUM 20 MG PO TBEC: 20.0000 mg | DELAYED_RELEASE_TABLET | Freq: Every day | ORAL | 0 refills | Status: DC

## 2021-07-24 MED ORDER — LIDOCAINE VISCOUS HCL 2 % MT SOLN
15.0000 mL | Freq: Once | OROMUCOSAL | Status: AC
  Administered 2021-07-24: 15 mL via ORAL
  Filled 2021-07-24: qty 15

## 2021-07-24 MED ORDER — ALUM & MAG HYDROXIDE-SIMETH 200-200-20 MG/5ML PO SUSP
30.0000 mL | Freq: Once | ORAL | Status: AC
  Administered 2021-07-24: 30 mL via ORAL
  Filled 2021-07-24: qty 30

## 2021-07-24 NOTE — ED Provider Triage Note (Signed)
Emergency Medicine Provider Triage Evaluation Note ? ?Brandy Townsend , a 71 y.o. female  was evaluated in triage.  Pt complains of chest pain that started at 7 AM when she woke up described as a pressure that radiates into her left arm and left neck, does not radiate to the back.  She also reports feeling a bit dizzy and short of breath.  She reports she has been having a lot of abdominal bloating and had a lot of gas and some generalized abdominal discomfort as well. ? ?Review of Systems  ?Positive: Chest pain, arm pain, shortness of breath, abdominal pain, lightheadedness ?Negative: Fevers, vomiting, syncope, headache ? ?Physical Exam  ?BP 118/77   Pulse 81   Temp 98.3 ?F (36.8 ?C) (Oral)   Resp 18   SpO2 97%  ?Gen:   Awake, no distress   ?Resp:  Normal effort, CTA bilat, RRR ?MSK:   Moves extremities without difficulty  ?Other:  Mild generalized abdominal tenderness. ? ?Medical Decision Making  ?Medically screening exam initiated at 8:52 AM.  Appropriate orders placed.  Brandy Townsend was informed that the remainder of the evaluation will be completed by another provider, this initial triage assessment does not replace that evaluation, and the importance of remaining in the ED until their evaluation is complete. ? ?History of coronary artery disease with persistent left-sided chest pain, EKG without evidence of STEMI, but story is concerning, chest pain work up initialted ?  ?Jacqlyn Larsen, PA-C ?07/24/21 3149 ? ?

## 2021-07-24 NOTE — ED Provider Notes (Signed)
Transfer of Care Note ?I assumed care of Brandy Townsend on 07/24/2021 at '@NOWNR'$ @. ? ?Briefly, Brandy Townsend is a 71 y.o. female who: ?- Abdominal blotting for 2 weeks, now going up to the mid chest today since 0730, intermittent  ?- CT abd neg  ?- CT PE neg  ?- Has gotten GI cocktail and Zofran  ? ?Clinical Course as of 07/24/21 1456  ?Sat Jul 24, 2021  ?1407 Troponin I (High Sensitivity) [LS]  ?  ?Clinical Course User Index ?[LS] Fransico Meadow, PA-C  ? ?CT Angio Chest PE W and/or Wo Contrast  ?Final Result  ?  ?CT ABDOMEN PELVIS W CONTRAST  ?Final Result  ?  ?DG Chest 2 View  ?Final Result  ?  ? ?The plan includes: ?- f/u with Delta trop  ?- Needs PPI while D/C  ? ? ?Please refer to the original provider?s note for additional information regarding the care of Brandy Townsend. ? ?### ?Reassessment: ?I personally reassessed the patient: ?-On initial assessment, patient was on her phone appears to be in no distress.  She continues to state that she does not feel well and has a headache.  She requested to get IV hydration.  No acute abdomen on my exam.  Patient is alert, oriented with GCS of 15. ? ?Vitals:  ? 07/24/21 1630 07/24/21 1700  ?BP: 112/79 125/74  ?Pulse: (!) 55 61  ?Resp:  (!) 22  ?Temp:    ?SpO2: 98% 100%  ? ? ?Additional MDM: ?-I have given patient a bolus of LR 500 mL.  Her delta troponin is unremarkable.  I have discussed patient findings including her CT imaging.  I informed her to follow-up with PCP in 2 to 3 days. I will send her with PPI for GI PPX. I believe patient is stable for discharge.  Strict return precaution has been discussed. ? ?Dispo: D/C  ?  ?Donnamarie Poag, MD ?07/24/21 1758 ? ?  ?Noemi Chapel, MD ?07/25/21 2313 ? ?

## 2021-07-24 NOTE — ED Triage Notes (Signed)
Pt to triage via [facility] from home.  Woke up at 7am feeling fine.  Reports pressure to center of chest, L arm pain (tightness), diaphoresis and dizziness that started at 7:30am.  Reports HR was 120 prior to EMS arrival and that her sats were "low for me".  Took ASA '324mg'$ .  Has felt bloated for 3 days. ? ?Initial BP 140/90 ?NTG x 1  ?Repeat BP 121/70 ?HR 78 ?IV- 18g L AC ?Pain 8/10 and decreased to 6 after NTG. ? ?

## 2021-07-24 NOTE — ED Notes (Signed)
Patient transported to CT 

## 2021-07-24 NOTE — Discharge Instructions (Addendum)
You have been evaluated for chest pain and epigastric pain.  Your work-up is grossly unremarkable.  Continue to hydrate aggressively. ? ?Please follow-up with your primary care provider in 2 to 3 days. ?

## 2021-07-24 NOTE — ED Provider Notes (Signed)
?[facility] ?Provider Note ? ? ?CSN: [id number] ?Arrival date & time: 07/24/21  0844 ? ?  ? ?History ? ?Chief Complaint  ?Patient presents with  ? Chest Pain  ? ? ?Brandy Townsend is a 71 y.o. female. ? ?Pt reports she has had abdominal bloating for the past 3 days.  Pt noticed same 2 weeks ago.  Pt has had similar in the past.  Pt reports she had discomfort in her chest and her arm.  Pt reports her 02 sat was low at home and she felt short of breath.  Pt used her albuterol inhaler and had relief.  Pt reports EMS gave her nitro and chest pain resolved however she still feels bloated and swollen.  Pt thinks she would be short of breath if she tried to walk.   ? ?The history is provided by the patient. No language interpreter was used.  ?Chest Pain ?Pain location:  L chest ?Pain quality: aching   ?Pain radiates to:  L arm ?Duration:  1 day ?Timing:  Constant ?Progression:  Resolved ?Context: at rest   ?Relieved by:  Nothing ?Associated symptoms: abdominal pain and nausea   ? ?  ? ?Home Medications ?Prior to Admission medications   ?Medication Sig Start Date End Date Taking? Authorizing Provider  ?abatacept (ORENCIA) 250 MG injection Inject 250 mg into the vein See admin instructions. Inject 250 mg IV once every month.   Yes [provider]  ?albuterol (VENTOLIN HFA) 108 (90 Base) MCG/ACT inhaler Inhale 1-2 puffs into the lungs every 6 (six) hours as needed for wheezing or shortness of breath. 06/01/21  Yes Brand Males, MD  ?aspirin EC 81 MG tablet Take 1 tablet (81 mg total) by mouth daily. Swallow whole. 12/17/19  Yes Burtis Junes, NP  ?BREO ELLIPTA 100-25 MCG/INH AEPB TAKE 1 PUFF BY MOUTH EVERY DAY ?Patient taking differently: Inhale 1 puff into the lungs daily. 01/04/21  Yes Brand Males, MD  ?cetirizine (ZYRTEC) 10 MG tablet Take 10 mg by mouth at bedtime as needed for allergies.    Yes [provider]  ?dicyclomine (BENTYL) 10 MG capsule Take 10 mg  by mouth 3 (three) times daily as needed. 04/28/21  Yes [provider]  ?KLOR-CON 10 10 MEQ tablet Take 10 mEq by mouth daily. 08/11/14  Yes [provider]  ?levothyroxine (SYNTHROID) 50 MCG tablet Take 50 mcg by mouth daily.  08/16/18  Yes [provider]  ?metoprolol tartrate (LOPRESSOR) 25 MG tablet Take 12.5 mg by mouth as needed (palpitations).   Yes [provider]  ?montelukast (SINGULAIR) 10 MG tablet Take 10 mg by mouth daily. 11/17/20  Yes [provider]  ?nitroGLYCERIN (NITROSTAT) 0.4 MG SL tablet Place 1 tablet (0.4 mg total) under the tongue every 5 (five) minutes as needed for chest pain. 05/27/20  Yes Josue Hector, MD  ?predniSONE (DELTASONE) 10 MG tablet Take 10 mg by mouth as needed.   Yes [provider]  ?Probiotic Product (PROBIOTIC GUMMIES PO) Take by mouth.   Yes [provider]  ?pyridOXINE (VITAMIN B-6) 100 MG tablet Take 100 mg by mouth daily.   Yes [provider]  ?rosuvastatin (CRESTOR) 10 MG tablet Take 1 tablet (10 mg total) by mouth daily. 12/31/20  Yes Josue Hector, MD  ?torsemide (DEMADEX) 20 MG tablet Take 1 tablet (20 mg total) by mouth daily. 05/15/19  Yes Burtis Junes, NP  ?verapamil (CALAN-SR) 240 MG CR tablet Take  1 tablet (240 mg total) by mouth daily. 04/23/21  Yes Josue Hector, MD  ?Vitamin D, Ergocalciferol, (DRISDOL) 1.25 MG (50000 UNIT) CAPS capsule Take 50,000 Units by mouth every 7 (seven) days. Monday   Yes [provider]  ?doxycycline (VIBRA-TABS) 100 MG tablet Take 1 tablet (100 mg total) by mouth 2 (two) times daily. 06/01/21   Brand Males, MD  ?folic acid (FOLVITE) 1 MG tablet Take 3 mg by mouth daily. Only takes on days she takes methotrexate ?Patient not taking: Reported on 06/01/2021 11/13/19   [provider]  ?   ? ?Allergies    ?Cat hair extract, Dust mite extract, Morphine and related, and Macrobid [nitrofurantoin monohyd macro]   ? ?Review of Systems   ?Review  of Systems  ?Cardiovascular:  Positive for chest pain.  ?Gastrointestinal:  Positive for abdominal pain and nausea.  ?All other systems reviewed and are negative. ? ?Physical Exam ?Updated Vital Signs ?BP (!) 101/57   Pulse 62   Temp 98.3 ?F (36.8 ?C) (Oral)   Resp (!) 21   SpO2 96%  ?Physical Exam ?Vitals and nursing note reviewed.  ?Constitutional:   ?   Appearance: She is well-developed.  ?HENT:  ?   Head: Normocephalic.  ?Cardiovascular:  ?   Rate and Rhythm: Normal rate and regular rhythm.  ?   Heart sounds: Normal heart sounds.  ?Pulmonary:  ?   Effort: Pulmonary effort is normal.  ?   Breath sounds: Normal breath sounds.  ?Abdominal:  ?   General: Bowel sounds are normal. There is no distension.  ?   Palpations: Abdomen is soft.  ?Musculoskeletal:     ?   General: Normal range of motion.  ?   Cervical back: Normal range of motion.  ?Skin: ?   General: Skin is warm.  ?Neurological:  ?   General: No focal deficit present.  ?   Mental Status: She is alert and oriented to person, place, and time.  ? ? ?ED Results / Procedures / Treatments   ?Labs ?(all labs ordered are listed, but only abnormal results are displayed) ?Labs Reviewed  ?BASIC METABOLIC PANEL - Abnormal; Notable for the following components:  ?    Result Value  ? Glucose, Bld 110 (*)   ? All other components within normal limits  ?CBC  ?HEPATIC FUNCTION PANEL  ?LIPASE, BLOOD  ?TROPONIN I (HIGH SENSITIVITY)  ?TROPONIN I (HIGH SENSITIVITY)  ? ? ?EKG ?EKG Interpretation ? ?Date/Time:  Saturday July 24 2021 08:59:27 EDT ?Ventricular Rate:  73 ?PR Interval:  130 ?QRS Duration: 72 ?QT Interval:  380 ?QTC Calculation: 418 ?R Axis:   70 ?Text Interpretation: Normal sinus rhythm Normal ECG When compared with ECG of 01-Sep-2020 08:56, PREVIOUS ECG IS PRESENT No significant change since last tracing Confirmed by Isla Pence [id number]) on 07/24/2021 10:19:09 AM ? ?Radiology ?DG Chest 2 View ? ?Result Date: 07/24/2021 ?CLINICAL DATA:  Chest pain EXAM: CHEST -  2 VIEW COMPARISON:  07/10/2020 FINDINGS: The heart size and mediastinal contours are within normal limits. Aortic atherosclerosis. Mildly hyperinflated lungs with chronically coarsened interstitial markings bilaterally. No focal airspace consolidation, pleural effusion, or pneumothorax. The visualized skeletal structures are unremarkable. IMPRESSION: No active cardiopulmonary disease. Electronically Signed   By: Davina Poke D.O.   On: 07/24/2021 10:25  ? ?CT Angio Chest PE W and/or Wo Contrast ? ?Result Date: 07/24/2021 ?CLINICAL DATA:  71 year old female with chest and abdominal pain with shortness of breath. EXAM: CT ANGIOGRAPHY CHEST CT  ABDOMEN AND PELVIS WITH CONTRAST TECHNIQUE: Multidetector CT imaging of the chest was performed using the standard protocol during bolus administration of intravenous contrast. Multiplanar CT image reconstructions and MIPs were obtained to evaluate the vascular anatomy. Multidetector CT imaging of the abdomen and pelvis was performed using the standard protocol during bolus administration of intravenous contrast. RADIATION DOSE REDUCTION: This exam was performed according to the departmental dose-optimization program which includes automated exposure control, adjustment of the mA and/or kV according to patient size and/or use of iterative reconstruction technique. CONTRAST:  160m OMNIPAQUE IOHEXOL 350 MG/ML SOLN COMPARISON:  07/22/2019 chest CT, 03/31/2021 abdominal/pelvic CT and prior studies FINDINGS: CTA CHEST FINDINGS Cardiovascular: Satisfactory opacification of the pulmonary arteries to the segmental level. No evidence of pulmonary embolism. UPPER limits normal heart size noted. Coronary artery and mild aortic atherosclerotic calcifications are again identified. There is no evidence of thoracic aortic aneurysm or pericardial effusion. Mediastinum/Nodes: No enlarged mediastinal, hilar, or axillary lymph nodes. Visualized esophagus and thyroid are unremarkable. RIGHT  thyroid nodules are stable from 2011 and no further imaging follow-up recommended. Lungs/Pleura: No airspace disease, consolidation, mass, suspicious nodule, pleural effusion or pneumothorax identified. Min

## 2021-07-29 DIAGNOSIS — H35033 Hypertensive retinopathy, bilateral: Secondary | ICD-10-CM | POA: Diagnosis not present

## 2021-08-02 DIAGNOSIS — D126 Benign neoplasm of colon, unspecified: Secondary | ICD-10-CM | POA: Diagnosis not present

## 2021-08-02 DIAGNOSIS — J4540 Moderate persistent asthma, uncomplicated: Secondary | ICD-10-CM | POA: Diagnosis not present

## 2021-08-02 DIAGNOSIS — Z87442 Personal history of urinary calculi: Secondary | ICD-10-CM | POA: Diagnosis not present

## 2021-08-02 DIAGNOSIS — M810 Age-related osteoporosis without current pathological fracture: Secondary | ICD-10-CM | POA: Diagnosis not present

## 2021-08-02 DIAGNOSIS — M060A Rheumatoid arthritis without rheumatoid factor, other specified site: Secondary | ICD-10-CM | POA: Diagnosis not present

## 2021-08-02 DIAGNOSIS — E890 Postprocedural hypothyroidism: Secondary | ICD-10-CM | POA: Diagnosis not present

## 2021-08-02 DIAGNOSIS — I700 Atherosclerosis of aorta: Secondary | ICD-10-CM | POA: Diagnosis not present

## 2021-08-02 DIAGNOSIS — I2510 Atherosclerotic heart disease of native coronary artery without angina pectoris: Secondary | ICD-10-CM | POA: Diagnosis not present

## 2021-08-02 DIAGNOSIS — R002 Palpitations: Secondary | ICD-10-CM | POA: Diagnosis not present

## 2021-08-02 DIAGNOSIS — K5730 Diverticulosis of large intestine without perforation or abscess without bleeding: Secondary | ICD-10-CM | POA: Diagnosis not present

## 2021-08-02 DIAGNOSIS — I10 Essential (primary) hypertension: Secondary | ICD-10-CM | POA: Diagnosis not present

## 2021-08-02 DIAGNOSIS — E785 Hyperlipidemia, unspecified: Secondary | ICD-10-CM | POA: Diagnosis not present

## 2021-08-04 NOTE — Progress Notes (Signed)
? ?Cardiology Office Note:   ? ?Date:  08/05/2021  ? ?ID:  Brandy Townsend, DOB 11-21-1950, MRN [id number] ? ?PCP:  Reynold Bowen, MD ?  ?[facility] Providers ?Cardiologist:  Jenkins Rouge, MD    ? ?Referring MD: Reynold Bowen, MD  ? ?Post ER Follow up  ? ?History of Present Illness:   ? ?Brandy Townsend is a 71 y.o. female with a hx of palpitations, CAD with recurrent atypical chest pain, asthma, RA, hx thyroid ablation, and remote tobacco use who presents for post ER follow up.  ? ?Echo 05/14/19 showed EF 55-60% with no valvular abnormalities. Cardiac CT 01/06/20 calcium score 790 (97 th percentile); CAD RADS 3 possibly obstructive RCA/OM1/D1.  FFR CT positive RCA/D1. She underwent subsequent cath on 01/08/20 which showed nonobstructive CAD with modest disease in the mid RCA with normal DFR, modest disease in the first OM and 70% ramus branch which was tiny. Medical Rx was recommended. She did not tolerate imdur with headache. ? ?She was seen in the ER on 07/24/21 for abdominal pain/bloating, chest pain, palpitations and sob. ECG with no ischemia and normal rhythm and delta HS trop negative. CT angio with no PE. Labs were normal.  ? ?Today the patient presents to clinic for follow up. Recent thyroid panel with Dr. Forde Dandy was normal. Has palpitations, more commonly in the AM. HRs has been up to 210. Notes HR in the 110-120s while doing activities like brushing teeth or putting on mascara. No chest pain since the episode in the ER. Walks routinely 30 minutes a day and aims for 10K steps with no issues. Has been taking albuterol but usually takes this at night. Has not been taking prednisone. Occasional dizziness but no syncope.  Has a beach trip planned and doesn't want to wear monitor there.  ? ? ? ?Past Medical History:  ?Diagnosis Date  ? Asthma   ? CAD (coronary artery disease)   ? Diverticulosis   ? Graves disease   ? s/p RAI  ? High cholesterol   ? Hypertension   ? Hypothyroidism   ? Nephrolithiasis   ?  Osteoporosis   ? Rheumatoid arthritis ([place])   ? Vertigo   ? ? ?Past Surgical History:  ?Procedure Laterality Date  ? BRONCHIAL WASHINGS  09/12/2019  ? Procedure: BRONCHIAL WASHINGS;  Surgeon: Brand Males, MD;  Location: [facility];  Service: Endoscopy;;  ? INTRAVASCULAR PRESSURE WIRE/FFR STUDY N/A 01/08/2020  ? Procedure: INTRAVASCULAR PRESSURE WIRE/FFR STUDY;  Surgeon: Martinique, Peter M, MD;  Location: [facility];  Service: Cardiovascular;  Laterality: N/A;  ? LEFT HEART CATH AND CORONARY ANGIOGRAPHY N/A 01/08/2020  ? Procedure: LEFT HEART CATH AND CORONARY ANGIOGRAPHY;  Surgeon: Martinique, Peter M, MD;  Location: [facility];  Service: Cardiovascular;  Laterality: N/A;  ? VIDEO BRONCHOSCOPY N/A 09/12/2019  ? Procedure: VIDEO BRONCHOSCOPY WITHOUT FLUORO;  Surgeon: Brand Males, MD;  Location: [facility];  Service: Endoscopy;  Laterality: N/A;  ? ? ?Current Medications: ?Current Meds  ?Medication Sig  ? abatacept (ORENCIA) 250 MG injection Inject 250 mg into the vein See admin instructions. Inject 250 mg IV once every month.  ? albuterol (VENTOLIN HFA) 108 (90 Base) MCG/ACT inhaler Inhale 1-2 puffs into the lungs every 6 (six) hours as needed for wheezing or shortness of breath.  ? aspirin EC 81 MG tablet Take 1 tablet (81 mg total) by mouth daily. Swallow whole.  ? BREO ELLIPTA 100-25 MCG/INH AEPB TAKE 1 PUFF BY MOUTH EVERY  DAY  ? cetirizine (ZYRTEC) 10 MG tablet Take 10 mg by mouth at bedtime as needed for allergies.   ? dicyclomine (BENTYL) 10 MG capsule Take 10 mg by mouth 3 (three) times daily as needed.  ? KLOR-CON 10 10 MEQ tablet Take 10 mEq by mouth daily.  ? levothyroxine (SYNTHROID) 50 MCG tablet Take 50 mcg by mouth daily.   ? metoprolol succinate (TOPROL XL) 25 MG 24 hr tablet Take 1 tablet (25 mg total) by mouth daily.  ? metoprolol tartrate (LOPRESSOR) 25 MG tablet Take 12.5 mg by mouth as needed (palpitations).  ? montelukast (SINGULAIR) 10 MG tablet Take 10 mg by mouth daily.   ? nitroGLYCERIN (NITROSTAT) 0.4 MG SL tablet Place 1 tablet (0.4 mg total) under the tongue every 5 (five) minutes as needed for chest pain.  ? pantoprazole (PROTONIX) 20 MG tablet Take 1 tablet (20 mg total) by mouth daily.  ? predniSONE (DELTASONE) 10 MG tablet Take 10 mg by mouth as needed.  ? Probiotic Product (PROBIOTIC GUMMIES PO) Take by mouth.  ? pyridOXINE (VITAMIN B-6) 100 MG tablet Take 100 mg by mouth daily.  ? rosuvastatin (CRESTOR) 10 MG tablet Take 1 tablet (10 mg total) by mouth daily.  ? torsemide (DEMADEX) 20 MG tablet Take 1 tablet (20 mg total) by mouth daily.  ? verapamil (CALAN-SR) 240 MG CR tablet Take 1 tablet (240 mg total) by mouth daily.  ? Vitamin D, Ergocalciferol, (DRISDOL) 1.25 MG (50000 UNIT) CAPS capsule Take 50,000 Units by mouth every 7 (seven) days. Monday  ?  ? ?Allergies:   Cat hair extract, Dust mite extract, Morphine and related, and Macrobid [nitrofurantoin monohyd macro]  ? ?Social History  ? ?Socioeconomic History  ? Marital status: Married  ?  Spouse name: Shanon Brow  ? Number of children: 2  ? Years of education: Not on file  ? Highest education level: Not on file  ?Occupational History  ? Occupation: retired  ?  Comment: preschool director/teacher  ?Tobacco Use  ? Smoking status: Former  ?  Packs/day: 1.00  ?  Years: 20.00  ?  Pack years: 20.00  ?  Types: Cigarettes  ?  Quit date: 08/26/2008  ?  Years since quitting: 12.9  ? Smokeless tobacco: Never  ? Tobacco comments:  ?  social smoker  ?Vaping Use  ? Vaping Use: Never used  ?Substance and Sexual Activity  ? Alcohol use: No  ? Drug use: No  ? Sexual activity: Not Currently  ?Other Topics Concern  ? Not on file  ?Social History Narrative  ? Not on file  ? ?Social Determinants of Health  ? ?Financial Resource Strain: Not on file  ?Food Insecurity: Not on file  ?Transportation Needs: Not on file  ?Physical Activity: Not on file  ?Stress: Not on file  ?Social Connections: Not on file  ?  ? ?Family History: ?The patient's family  history includes Breast cancer in her mother; Cancer (age of onset: 67) in her daughter; Heart disease in her father. ? ?ROS:   ?Please see the history of present illness.    ? All other systems reviewed and are negative. ? ?EKGs/Labs/Other Studies Reviewed:   ? ?The following studies were reviewed today: ? ?______________________ ? ?CARDIAC CATHETERIZATION ?  ?Result Date: 01/08/2020 ?? Mid LAD lesion is 20% stenosed. ? 1st Diag lesion is 40% stenosed. ? Ramus lesion is 70% stenosed. ? 1st Mrg lesion is 60% stenosed. ? Prox RCA to Mid RCA lesion is 55% stenosed. ?  The left ventricular systolic function is normal. ? LV end diastolic pressure is normal. ? The left ventricular ejection fraction is 55-65% by visual estimate.  1. Nonobstructive CAD. There is modest disease in the mid RCA with normal DFR. Modest disease in the first OM. The ramus branch is tiny. 2. Normal LV function 3. Normal LVEDP. Plan: recommend continued medical therapy. Patient is a candidate for DC today. ?  ?  ?CORONARY CT IMPRESSION 12/2019: ?1. Calcium score 790 which is 52 th percentile for age and sex and ?involves all 3 major epicardial vessels ?  ?2.  Normal aortic root 2.9 cm ?  ?3. CAD RADS 3 possible obstructive CAD tightest lesions appear to be ?in mid/distal RCA and OM1 Study sent for FFR CT ? ?__________________________ ? ?Myoview Study Highlights 04/2019 ?  ?The left ventricular ejection fraction is hyperdynamic (>65%). ?Nuclear stress EF: 77%. ?Horizontal ST segment depression ST segment depression of 1 mm was noted during stress in the II, III and aVF leads, and returning to baseline after 5-9 minutes of recovery. ?The study is normal. ?This is a low risk study. ?No change from prior study. ?   ?  ?________________________ ? ?ECHO IMPRESSIONS 04/2019 ? 1. Left ventricular ejection fraction, by estimation, is 55 to 60%. The  ?left ventricle has normal function. The left ventricle has no regional  ?wall motion abnormalities. Left  ventricular diastolic parameters were  ?normal.  ? 2. Right ventricular systolic function is normal. The right ventricular  ?size is normal. There is normal pulmonary artery systolic pressure. The  ?estimated right

## 2021-08-05 ENCOUNTER — Encounter: Payer: Self-pay | Admitting: Physician Assistant

## 2021-08-05 ENCOUNTER — Ambulatory Visit (INDEPENDENT_AMBULATORY_CARE_PROVIDER_SITE_OTHER): Payer: Medicare Other

## 2021-08-05 ENCOUNTER — Ambulatory Visit (INDEPENDENT_AMBULATORY_CARE_PROVIDER_SITE_OTHER): Payer: Medicare Other | Admitting: Physician Assistant

## 2021-08-05 VITALS — BP 136/74 | HR 74 | Ht 63.6 in | Wt 151.8 lb

## 2021-08-05 DIAGNOSIS — I10 Essential (primary) hypertension: Secondary | ICD-10-CM | POA: Diagnosis not present

## 2021-08-05 DIAGNOSIS — Z889 Allergy status to unspecified drugs, medicaments and biological substances status: Secondary | ICD-10-CM | POA: Insufficient documentation

## 2021-08-05 DIAGNOSIS — R42 Dizziness and giddiness: Secondary | ICD-10-CM | POA: Insufficient documentation

## 2021-08-05 DIAGNOSIS — N951 Menopausal and female climacteric states: Secondary | ICD-10-CM | POA: Insufficient documentation

## 2021-08-05 DIAGNOSIS — M069 Rheumatoid arthritis, unspecified: Secondary | ICD-10-CM | POA: Diagnosis not present

## 2021-08-05 DIAGNOSIS — R002 Palpitations: Secondary | ICD-10-CM | POA: Diagnosis not present

## 2021-08-05 DIAGNOSIS — E785 Hyperlipidemia, unspecified: Secondary | ICD-10-CM | POA: Diagnosis not present

## 2021-08-05 DIAGNOSIS — R109 Unspecified abdominal pain: Secondary | ICD-10-CM | POA: Insufficient documentation

## 2021-08-05 DIAGNOSIS — I2510 Atherosclerotic heart disease of native coronary artery without angina pectoris: Secondary | ICD-10-CM

## 2021-08-05 DIAGNOSIS — R0602 Shortness of breath: Secondary | ICD-10-CM | POA: Diagnosis not present

## 2021-08-05 DIAGNOSIS — E876 Hypokalemia: Secondary | ICD-10-CM | POA: Insufficient documentation

## 2021-08-05 MED ORDER — METOPROLOL SUCCINATE ER 25 MG PO TB24: 25.0000 mg | ORAL_TABLET | Freq: Every day | ORAL | 3 refills | Status: DC

## 2021-08-05 NOTE — Patient Instructions (Addendum)
Medication Instructions:  ?Metoprolol Succinate (Toprol XL) 25 mg daily  ? ? ?*If you need a refill on your cardiac medications before your next appointment, please call your pharmacy* ? ? ?Lab Work: ?None ordered  ? ?If you have labs (blood work) drawn today and your tests are completely normal, you will receive your results only by: ?MyChart Message (if you have MyChart) OR ?A paper copy in the mail ?If you have any lab test that is abnormal or we need to change your treatment, we will call you to review the results. ? ? ?Testing/Procedures: ?A zio monitor was ordered today. It will remain on for 14 days. You will then return monitor and event diary in provided box. It takes 1-2 weeks for report to be downloaded and returned to [place]. We will call you with the results. If monitor falls off or has orange flashing light, please call Z[employer] for further instructions.  ? ?Your physician has requested that you have an echocardiogram. Echocardiography is a painless test that uses sound waves to create images of your heart. It provides your doctor with information about the size and shape of your heart and how well your heart?s chambers and valves are working. This procedure takes approximately one hour. There are no restrictions for this procedure. ? ? ?Follow-Up: ?Follow up as scheduled 1}  ? ? ?Other Instructions ?ZIO XT- Long Term Monitor Instructions ? ?Your physician has requested you wear a ZIO patch monitor for 14 days.  ?This is a single patch monitor. [employer] supplies one patch monitor per enrollment. Additional ?stickers are not available. Please do not apply patch if you will be having a Nuclear Stress Test,  ?Echocardiogram, Cardiac CT, MRI, or Chest Xray during the period you would be wearing the  ?monitor. The patch cannot be worn during these tests. You cannot remove and re-apply the  ?ZIO XT patch monitor.  ?Your ZIO patch monitor will be mailed 3 day USPS to your address on file. It may take 3-5 days  ?to  receive your monitor after you have been enrolled.  ?Once you have received your monitor, please review the enclosed instructions. Your monitor  ?has already been registered assigning a specific monitor serial # to you. ? ?Billing and Patient Assistance Program Information ? ?We have supplied [employer] with any of your insurance information on file for billing purposes. ?[employer] offers a sliding scale Patient Assistance Program for patients that do not have  ?insurance, or whose insurance does not completely cover the cost of the ZIO monitor.  ?You must apply for the Patient Assistance Program to qualify for this discounted rate.  ?To apply, please call [employer] at [phone], select option 4, select option 2, ask to apply for  ?Patient Assistance Program. Theodore Demark will ask your household income, and how many people  ?are in your household. They will quote your out-of-pocket cost based on that information.  ?[employer] will also be able to set up a 50-month interest-free payment plan if needed. ? ?Applying the monitor ?  ?Shave hair from upper left chest.  ?Hold abrader disc by orange tab. Rub abrader in 40 strokes over the upper left chest as  ?indicated in your monitor instructions.  ?Clean area with 4 enclosed alcohol pads. Let dry.  ?Apply patch as indicated in monitor instructions. Patch will be placed under collarbone on left  ?side of chest with arrow pointing upward.  ?Rub patch adhesive wings for 2 minutes. Remove white label marked "1". Remove the white  ?  label marked "2". Rub patch adhesive wings for 2 additional minutes.  ?While looking in a mirror, press and release button in center of patch. A small green light will  ?flash 3-4 times. This will be your only indicator that the monitor has been turned on.  ?Do not shower for the first 24 hours. You may shower after the first 24 hours.  ?Press the button if you feel a symptom. You will hear a small click. Record Date, Time and  ?Symptom in the Patient  Logbook.  ?When you are ready to remove the patch, follow instructions on the last 2 pages of Patient  ?Logbook. Stick patch monitor onto the last page of Patient Logbook.  ?Place Patient Logbook in the blue and white box. Use locking tab on box and tape box closed  ?securely. The blue and white box has prepaid postage on it. Please place it in the mailbox as  ?soon as possible. Your physician should have your test results approximately 7 days after the  ?monitor has been mailed back to [facility].  ?Call [facility] at [phone] if you have questions regarding  ?your ZIO XT patch monitor. Call them immediately if you see an orange light blinking on your  ?monitor.  ?If your monitor falls off in less than 4 days, contact our Monitor department at [phone].  ?If your monitor becomes loose or falls off after 4 days call [employer] at [phone] for  ?suggestions on securing your monitor ? ? ? ? ? ?  ?

## 2021-08-05 NOTE — Progress Notes (Unsigned)
[id number] from office inventory applied to patient. ?

## 2021-08-09 ENCOUNTER — Ambulatory Visit (HOSPITAL_COMMUNITY): Payer: Medicare Other | Attending: Cardiovascular Disease

## 2021-08-09 DIAGNOSIS — R0602 Shortness of breath: Secondary | ICD-10-CM

## 2021-08-09 LAB — ECHOCARDIOGRAM COMPLETE: Area-P 1/2: 3.72 cm2

## 2021-08-16 DIAGNOSIS — M0579 Rheumatoid arthritis with rheumatoid factor of multiple sites without organ or systems involvement: Secondary | ICD-10-CM | POA: Diagnosis not present

## 2021-08-26 DIAGNOSIS — R002 Palpitations: Secondary | ICD-10-CM | POA: Diagnosis not present

## 2021-08-27 ENCOUNTER — Telehealth: Payer: Self-pay

## 2021-08-27 DIAGNOSIS — I471 Supraventricular tachycardia: Secondary | ICD-10-CM

## 2021-08-27 DIAGNOSIS — J4540 Moderate persistent asthma, uncomplicated: Secondary | ICD-10-CM

## 2021-08-27 NOTE — Telephone Encounter (Signed)
-----   Message from Josue Hector, MD sent at 08/27/2021 10:58 AM EDT ----- She is having self limited runs of SVT Since asthma complicates Rx refer to [facility] to consider ablation  ----- Message ----- From: Michaelyn Barter, RN Sent: 08/27/2021   8:23 AM EDT To: Josue Hector, MD, Eileen Stanford, PA-C  Called patient with results. Patient is wanting to know why she is having all these episodes of a racing heart. She also wants to know if there is something else besides the metoprolol that she can take, because it is causing her Asthma to flare up. Patient stated she has to use her inhaler at night now. Will forward to Nell Range and Dr. Johnsie Cancel for advisement.

## 2021-08-27 NOTE — Telephone Encounter (Signed)
Referral has been placed and patient is aware.

## 2021-08-30 ENCOUNTER — Telehealth: Payer: Self-pay | Admitting: Internal Medicine

## 2021-08-30 ENCOUNTER — Encounter: Payer: Self-pay | Admitting: Cardiovascular Disease

## 2021-08-31 ENCOUNTER — Encounter (HOSPITAL_BASED_OUTPATIENT_CLINIC_OR_DEPARTMENT_OTHER): Payer: Self-pay

## 2021-08-31 ENCOUNTER — Ambulatory Visit: Payer: Medicare Other | Admitting: Nurse Practitioner

## 2021-08-31 ENCOUNTER — Other Ambulatory Visit: Payer: Self-pay

## 2021-08-31 ENCOUNTER — Emergency Department (HOSPITAL_BASED_OUTPATIENT_CLINIC_OR_DEPARTMENT_OTHER)
Admission: EM | Admit: 2021-08-31 | Discharge: 2021-08-31 | Disposition: A | Discharge status: Home / Self Care | Payer: Medicare Other | Attending: Emergency Medicine | Admitting: Emergency Medicine

## 2021-08-31 DIAGNOSIS — R110 Nausea: Secondary | ICD-10-CM | POA: Insufficient documentation

## 2021-08-31 DIAGNOSIS — Z79899 Other long term (current) drug therapy: Secondary | ICD-10-CM | POA: Insufficient documentation

## 2021-08-31 DIAGNOSIS — Z7982 Long term (current) use of aspirin: Secondary | ICD-10-CM | POA: Insufficient documentation

## 2021-08-31 DIAGNOSIS — R6889 Other general symptoms and signs: Secondary | ICD-10-CM | POA: Diagnosis not present

## 2021-08-31 DIAGNOSIS — M069 Rheumatoid arthritis, unspecified: Secondary | ICD-10-CM | POA: Diagnosis not present

## 2021-08-31 DIAGNOSIS — M79641 Pain in right hand: Secondary | ICD-10-CM | POA: Diagnosis not present

## 2021-08-31 MED ORDER — METHYLPREDNISOLONE ACETATE 80 MG/ML IJ SUSP
40.0000 mg | Freq: Once | INTRAMUSCULAR | Status: AC
  Administered 2021-08-31: 40 mg via INTRAMUSCULAR
  Filled 2021-08-31: qty 1

## 2021-08-31 MED ORDER — HYDROCODONE-ACETAMINOPHEN 5-325 MG PO TABS
1.0000 | ORAL_TABLET | Freq: Once | ORAL | Status: AC
  Administered 2021-08-31: 1 via ORAL
  Filled 2021-08-31: qty 1

## 2021-08-31 MED ORDER — HYDROCODONE-ACETAMINOPHEN 5-325 MG PO TABS: 1.0000 | ORAL_TABLET | Freq: Four times a day (QID) | ORAL | 0 refills | Status: DC | PRN

## 2021-08-31 MED ORDER — ONDANSETRON 4 MG PO TBDP
4.0000 mg | ORAL_TABLET | Freq: Once | ORAL | Status: AC
  Administered 2021-08-31: 4 mg via ORAL
  Filled 2021-08-31: qty 1

## 2021-08-31 MED ORDER — KETOROLAC TROMETHAMINE 30 MG/ML IJ SOLN
30.0000 mg | Freq: Once | INTRAMUSCULAR | Status: AC
  Administered 2021-08-31: 30 mg via INTRAMUSCULAR
  Filled 2021-08-31: qty 1

## 2021-08-31 NOTE — ED Triage Notes (Signed)
Right hand pain since 1900 last night becoming progressively worse through the night.

## 2021-08-31 NOTE — ED Notes (Addendum)
ED Provider at bedside to re-eval R hand/wrist and discuss pt concern of pain meds not effective

## 2021-08-31 NOTE — ED Provider Notes (Signed)
[facility]  Provider Note  CSN: [id number] Arrival date & time: 08/31/21 0308  History Chief Complaint  Patient presents with   Hand Pain    Brandy Townsend is a 71 y.o. female with history of RA affecting her hands reports several hours of increasing pain in R hand, worse with movement. She was cleaning some windows yesterday and may have cause a flare of her RA. She usually gets Orencia monthly, no longer on methotrexate or oral steroids. Last time she had a flare, she got depomedrol x 2 from her rheum doctors. She is having some nausea now, but no fever, vomiting or other acute complaints.    Home Medications Prior to Admission medications   Medication Sig Start Date End Date Taking? Authorizing Provider  HYDROcodone-acetaminophen (NORCO/VICODIN) 5-325 MG tablet Take 1 tablet by mouth every 6 (six) hours as needed for severe pain. 08/31/21  Yes Truddie Hidden, MD  abatacept (ORENCIA) 250 MG injection Inject 250 mg into the vein See admin instructions. Inject 250 mg IV once every month.    [provider]  albuterol (VENTOLIN HFA) 108 (90 Base) MCG/ACT inhaler Inhale 1-2 puffs into the lungs every 6 (six) hours as needed for wheezing or shortness of breath. 06/01/21   Brand Males, MD  aspirin EC 81 MG tablet Take 1 tablet (81 mg total) by mouth daily. Swallow whole. 12/17/19   Burtis Junes, NP  BREO ELLIPTA 100-25 MCG/INH AEPB TAKE 1 PUFF BY MOUTH EVERY DAY 01/04/21   Brand Males, MD  cetirizine (ZYRTEC) 10 MG tablet Take 10 mg by mouth at bedtime as needed for allergies.     [provider]  dicyclomine (BENTYL) 10 MG capsule Take 10 mg by mouth 3 (three) times daily as needed. 04/28/21   [provider]  KLOR-CON 10 10 MEQ tablet Take 10 mEq by mouth daily. 08/11/14   [provider]  levothyroxine (SYNTHROID) 50 MCG tablet Take 50 mcg by mouth daily.  08/16/18   [provider]  metoprolol succinate  (TOPROL XL) 25 MG 24 hr tablet Take 1 tablet (25 mg total) by mouth daily. 08/05/21   Eileen Stanford, PA-C  metoprolol tartrate (LOPRESSOR) 25 MG tablet Take 12.5 mg by mouth as needed (palpitations).    [provider]  montelukast (SINGULAIR) 10 MG tablet Take 10 mg by mouth daily. 11/17/20   [provider]  nitroGLYCERIN (NITROSTAT) 0.4 MG SL tablet Place 1 tablet (0.4 mg total) under the tongue every 5 (five) minutes as needed for chest pain. 05/27/20   Josue Hector, MD  pantoprazole (PROTONIX) 20 MG tablet Take 1 tablet (20 mg total) by mouth daily. 07/24/21   Donnamarie Poag, MD  predniSONE (DELTASONE) 10 MG tablet Take 10 mg by mouth as needed.    [provider]  Probiotic Product (PROBIOTIC GUMMIES PO) Take by mouth.    [provider]  pyridOXINE (VITAMIN B-6) 100 MG tablet Take 100 mg by mouth daily.    [provider]  rosuvastatin (CRESTOR) 10 MG tablet Take 1 tablet (10 mg total) by mouth daily. 12/31/20   Josue Hector, MD  torsemide (DEMADEX) 20 MG tablet Take 1 tablet (20 mg total) by mouth daily. 05/15/19   Burtis Junes, NP  verapamil (CALAN-SR) 240 MG CR tablet Take 1 tablet (240 mg total) by mouth daily. 04/23/21   Josue Hector, MD  Vitamin D, Ergocalciferol, (DRISDOL) 1.25 MG (50000 UNIT) CAPS  capsule Take 50,000 Units by mouth every 7 (seven) days. Monday    [provider]     Allergies    Cat hair extract, Dust mite extract, Morphine and related, and Macrobid [nitrofurantoin monohyd macro]   Review of Systems   Review of Systems Please see HPI for pertinent positives and negatives  Physical Exam BP 123/77   Pulse 76   Temp 98.3 F (36.8 C) (Oral)   Resp 16   Ht 5' 3.5" (1.613 m)   Wt 67.6 kg   SpO2 95%   BMI 25.98 kg/m   Physical Exam Vitals and nursing note reviewed.  Constitutional:      Appearance: Normal appearance.  HENT:     Head: Normocephalic and atraumatic.     Nose: Nose  normal.     Mouth/Throat:     Mouth: Mucous membranes are moist.  Eyes:     Extraocular Movements: Extraocular movements intact.     Conjunctiva/sclera: Conjunctivae normal.  Cardiovascular:     Rate and Rhythm: Normal rate.  Pulmonary:     Effort: Pulmonary effort is normal.     Breath sounds: Normal breath sounds.  Abdominal:     General: Abdomen is flat.     Palpations: Abdomen is soft.     Tenderness: There is no abdominal tenderness.  Musculoskeletal:        General: Tenderness (diffuse R hand) present. No swelling or deformity. Normal range of motion.     Cervical back: Neck supple.  Skin:    General: Skin is warm and dry.  Neurological:     General: No focal deficit present.     Mental Status: She is alert.  Psychiatric:        Mood and Affect: Mood normal.    ED Results / Procedures / Treatments   EKG None  Procedures Procedures  Medications Ordered in the ED Medications  ondansetron (ZOFRAN-ODT) disintegrating tablet 4 mg (4 mg Oral Given 08/31/21 0357)  methylPREDNISolone acetate (DEPO-MEDROL) injection 40 mg (40 mg Intramuscular Given 08/31/21 0444)  ketorolac (TORADOL) 30 MG/ML injection 30 mg (30 mg Intramuscular Given 08/31/21 0444)  HYDROcodone-acetaminophen (NORCO/VICODIN) 5-325 MG per tablet 1 tablet (1 tablet Oral Given 08/31/21 0546)    Initial Impression and Plan  Patient here with hand pain, likely RA flare. She 'does not do well' with oral steroids or narcotics. Will give depomedrol. She is also requesting Toradol as she has had good relief with that in the past.   ED Course   Clinical Course as of 08/31/21 0659  Tue Aug 31, 2021  0538 Patient reports some pain improvement, able to ROM better but still having severe pain. Willing to take norco now. Also will give her a wrist brace.  [CS]  [phone] Patient feeling better with wrist brace. Plan discharge with short course of Norco. Rheum follow up.  [CS]    Clinical Course User Index [CS] Truddie Hidden, MD     MDM Rules/Calculators/A&P Medical Decision Making Problems Addressed: Rheumatoid arthritis involving right hand, unspecified whether rheumatoid factor present [facility]): chronic illness or injury with exacerbation, progression, or side effects of treatment  Risk Prescription drug management.    Final Clinical Impression(s) / ED Diagnoses Final diagnoses:  Right hand pain  Rheumatoid arthritis involving right hand, unspecified whether rheumatoid factor present ([place])    Rx / DC Orders ED Discharge Orders          Ordered    HYDROcodone-acetaminophen (NORCO/VICODIN) 5-325 MG tablet  Every 6 hours PRN        08/31/21 0625             Truddie Hidden, MD 08/31/21 [phone]

## 2021-08-31 NOTE — ED Notes (Signed)
Pt requesting nurse to bedside -- Upon entering room pt reporting pain meds have not helped although it appears she is moving R hand better than upon arrival-- she also would you like to M.D. to re-eval wrist;  appears to be a nodule that wasn't noticeable before to medial side of R wrist and it is warm to touch along with palm area of hand distal to the thumb

## 2021-08-31 NOTE — ED Notes (Signed)
Pt agreeable with d/c plan as discussed by provider- this nurse has verbally reinforced d/c instructions and provided pt with written copy- pt acknowledges verbal understanding and denies any additional questions, concerns, needs- escorted to waiting room via w/c to await ride; ride enroute

## 2021-09-14 DIAGNOSIS — M0579 Rheumatoid arthritis with rheumatoid factor of multiple sites without organ or systems involvement: Secondary | ICD-10-CM | POA: Diagnosis not present

## 2021-09-15 ENCOUNTER — Ambulatory Visit (INDEPENDENT_AMBULATORY_CARE_PROVIDER_SITE_OTHER): Payer: Medicare Other | Admitting: Internal Medicine

## 2021-09-15 ENCOUNTER — Encounter: Payer: Self-pay | Admitting: Internal Medicine

## 2021-09-15 VITALS — BP 118/72 | HR 72 | Ht 63.5 in | Wt 151.8 lb

## 2021-09-15 DIAGNOSIS — I25119 Atherosclerotic heart disease of native coronary artery with unspecified angina pectoris: Secondary | ICD-10-CM

## 2021-09-15 DIAGNOSIS — I10 Essential (primary) hypertension: Secondary | ICD-10-CM | POA: Diagnosis not present

## 2021-09-15 DIAGNOSIS — R002 Palpitations: Secondary | ICD-10-CM | POA: Diagnosis not present

## 2021-09-15 NOTE — Patient Instructions (Addendum)
Medication Instructions:  Your physician recommends that you continue on your current medications as directed. Please refer to the Current Medication list given to you today.  Labwork: None ordered.  Testing/Procedures: None ordered.  Follow-Up: Your physician wants you to follow-up in: AS NEEDED with Cristopher Peru, MD    Any Other Special Instructions Will Be Listed Below (If Applicable).  If you need a refill on your cardiac medications before your next appointment, please call your pharmacy.   Important Information About Sugar

## 2021-09-15 NOTE — Progress Notes (Signed)
HPI Brandy Townsend is referred by Dr. Johnsie Cancel for evaluation of palpitations and NS SVT. She is a pleasant 71 yo woman with thyroid dysfunction and RA. She has noted palpitations over the last year. She has a smart watch or fitbit which rarely shows HR's of 170 but mostly, particularly in the morning of HR's in the 100-110 range. She wore a cardiac monitor demonstrating brief episodes of AT, lasting less than 10 seconds. No prolonged pauses and no atrial fib. She has non-obstructive CAD. She has normal LV function.  Allergies  Allergen Reactions   Cat Hair Extract     Other reaction(s): congestion   Dust Mite Extract     Other reaction(s): Unknown   Morphine And Related Nausea And Vomiting   Macrobid [Nitrofurantoin Monohyd Macro] Other (See Comments)    Lowered potassium, caused aches and pains     Current Outpatient Medications  Medication Sig Dispense Refill   abatacept (ORENCIA) 250 MG injection Inject 250 mg into the vein See admin instructions. Inject 250 mg IV once every month.     albuterol (VENTOLIN HFA) 108 (90 Base) MCG/ACT inhaler Inhale 1-2 puffs into the lungs every 6 (six) hours as needed for wheezing or shortness of breath. 8 g 5   aspirin EC 81 MG tablet Take 1 tablet (81 mg total) by mouth daily. Swallow whole. 90 tablet 3   BREO ELLIPTA 100-25 MCG/INH AEPB TAKE 1 PUFF BY MOUTH EVERY DAY 60 each 5   cetirizine (ZYRTEC) 10 MG tablet Take 10 mg by mouth at bedtime as needed for allergies.      dicyclomine (BENTYL) 10 MG capsule Take 10 mg by mouth 3 (three) times daily as needed.     HYDROcodone-acetaminophen (NORCO/VICODIN) 5-325 MG tablet Take 1 tablet by mouth every 6 (six) hours as needed for severe pain. 12 tablet 0   KLOR-CON 10 10 MEQ tablet Take 10 mEq by mouth daily.  0   levothyroxine (SYNTHROID) 50 MCG tablet Take 50 mcg by mouth daily.      montelukast (SINGULAIR) 10 MG tablet Take 10 mg by mouth daily.     nitroGLYCERIN (NITROSTAT) 0.4 MG SL tablet Place 1  tablet (0.4 mg total) under the tongue every 5 (five) minutes as needed for chest pain. 25 tablet 3   predniSONE (DELTASONE) 10 MG tablet Take 10 mg by mouth as needed.     Probiotic Product (PROBIOTIC GUMMIES PO) Take by mouth.     pyridOXINE (VITAMIN B-6) 100 MG tablet Take 100 mg by mouth daily.     rosuvastatin (CRESTOR) 10 MG tablet Take 1 tablet (10 mg total) by mouth daily. 90 tablet 3   torsemide (DEMADEX) 20 MG tablet Take 1 tablet (20 mg total) by mouth daily. 90 tablet 3   verapamil (CALAN-SR) 240 MG CR tablet Take 1 tablet (240 mg total) by mouth daily. 90 tablet 3   Vitamin D, Ergocalciferol, (DRISDOL) 1.25 MG (50000 UNIT) CAPS capsule Take 50,000 Units by mouth every 7 (seven) days. Monday     No current facility-administered medications for this visit.     Past Medical History:  Diagnosis Date   Asthma    CAD (coronary artery disease)    Diverticulosis    Graves disease    s/p RAI   High cholesterol    Hypertension    Hypothyroidism    Nephrolithiasis    Osteoporosis    Rheumatoid arthritis ([place])    Vertigo  ROS:   All systems reviewed and negative except as noted in the HPI.   Past Surgical History:  Procedure Laterality Date   BRONCHIAL WASHINGS  09/12/2019   Procedure: BRONCHIAL WASHINGS;  Surgeon: Brand Males, MD;  Location: [facility];  Service: Endoscopy;;   INTRAVASCULAR PRESSURE WIRE/FFR STUDY N/A 01/08/2020   Procedure: INTRAVASCULAR PRESSURE WIRE/FFR STUDY;  Surgeon: Martinique, Peter M, MD;  Location: [facility];  Service: Cardiovascular;  Laterality: N/A;   LEFT HEART CATH AND CORONARY ANGIOGRAPHY N/A 01/08/2020   Procedure: LEFT HEART CATH AND CORONARY ANGIOGRAPHY;  Surgeon: Martinique, Peter M, MD;  Location: [place] [facility];  Service: Cardiovascular;  Laterality: N/A;   VIDEO BRONCHOSCOPY N/A 09/12/2019   Procedure: VIDEO BRONCHOSCOPY WITHOUT FLUORO;  Surgeon: Brand Males, MD;  Location: [facility];  Service: Endoscopy;   Laterality: N/A;     Family History  Problem Relation Age of Onset   Breast cancer Mother    Heart disease Father    Cancer Daughter 10       stage 3 breast      Social History   Socioeconomic History   Marital status: Married    Spouse name: Shanon Brow   Number of children: 2   Years of education: Not on file   Highest education level: Not on file  Occupational History   Occupation: retired    Comment: Statistician  Tobacco Use   Smoking status: Former    Packs/day: 1.00    Years: 20.00    Total pack years: 20.00    Types: Cigarettes    Quit date: 08/26/2008    Years since quitting: 13.0   Smokeless tobacco: Never   Tobacco comments:    social smoker  Vaping Use   Vaping Use: Never used  Substance and Sexual Activity   Alcohol use: No   Drug use: No   Sexual activity: Not Currently  Other Topics Concern   Not on file  Social History Narrative   Not on file   Social Determinants of Health   Financial Resource Strain: Not on file  Food Insecurity: Not on file  Transportation Needs: Not on file  Physical Activity: Not on file  Stress: Not on file  Social Connections: Not on file  Intimate Partner Violence: Not on file     BP 118/72   Pulse 72   Ht 5' 3.5" (1.613 m)   Wt 151 lb 12.8 oz (68.9 kg)   SpO2 95%   BMI 26.47 kg/m   Physical Exam:  Well appearing NAD HEENT: Unremarkable Neck:  No JVD, no thyromegally Lymphatics:  No adenopathy Back:  No CVA tenderness Lungs:  Clear with no wheezes HEART:  Regular rate rhythm, no murmurs, no rubs, no clicks Abd:  soft, positive bowel sounds, no organomegally, no rebound, no guarding Ext:  2 plus pulses, no edema, no cyanosis, no clubbing Skin:  No rashes no nodules Neuro:  CN II through XII intact, motor grossly intact  EKG - reviewed. NSR   Assess/Plan:  Palpitations - I discussed the mechanism of her arrhythmias (focal firing) and the benign nature. We discussed the treatment options  from watchful waiting and reducing her caffeine intake to AA Drug therapy which I did not recommend to taking more beta blocker. She will undergo watchful waiting and notify us if her symptoms worsen. CAD - she has non-obstructive CAD. She is on crestor.  Brandy Townsend Brandy Dubreuil,MD

## 2021-09-22 NOTE — Telephone Encounter (Signed)
Noted     Closing encounter

## 2021-10-04 DIAGNOSIS — M0579 Rheumatoid arthritis with rheumatoid factor of multiple sites without organ or systems involvement: Secondary | ICD-10-CM | POA: Diagnosis not present

## 2021-10-07 ENCOUNTER — Institutional Professional Consult (permissible substitution): Payer: Medicare Other | Admitting: Internal Medicine

## 2021-10-14 DIAGNOSIS — Z79899 Other long term (current) drug therapy: Secondary | ICD-10-CM | POA: Diagnosis not present

## 2021-10-14 DIAGNOSIS — M0579 Rheumatoid arthritis with rheumatoid factor of multiple sites without organ or systems involvement: Secondary | ICD-10-CM | POA: Diagnosis not present

## 2021-10-18 DIAGNOSIS — R9389 Abnormal findings on diagnostic imaging of other specified body structures: Secondary | ICD-10-CM | POA: Diagnosis not present

## 2021-10-18 DIAGNOSIS — Z6825 Body mass index (BMI) 25.0-25.9, adult: Secondary | ICD-10-CM | POA: Diagnosis not present

## 2021-10-18 DIAGNOSIS — Z79899 Other long term (current) drug therapy: Secondary | ICD-10-CM | POA: Diagnosis not present

## 2021-10-18 DIAGNOSIS — E663 Overweight: Secondary | ICD-10-CM | POA: Diagnosis not present

## 2021-10-18 DIAGNOSIS — M810 Age-related osteoporosis without current pathological fracture: Secondary | ICD-10-CM | POA: Diagnosis not present

## 2021-10-18 DIAGNOSIS — M0579 Rheumatoid arthritis with rheumatoid factor of multiple sites without organ or systems involvement: Secondary | ICD-10-CM | POA: Diagnosis not present

## 2021-10-26 DIAGNOSIS — I2510 Atherosclerotic heart disease of native coronary artery without angina pectoris: Secondary | ICD-10-CM | POA: Diagnosis not present

## 2021-10-26 DIAGNOSIS — I10 Essential (primary) hypertension: Secondary | ICD-10-CM | POA: Diagnosis not present

## 2021-10-26 DIAGNOSIS — R002 Palpitations: Secondary | ICD-10-CM | POA: Diagnosis not present

## 2021-10-26 DIAGNOSIS — J4540 Moderate persistent asthma, uncomplicated: Secondary | ICD-10-CM | POA: Diagnosis not present

## 2021-10-26 DIAGNOSIS — M810 Age-related osteoporosis without current pathological fracture: Secondary | ICD-10-CM | POA: Diagnosis not present

## 2021-10-26 DIAGNOSIS — Z87442 Personal history of urinary calculi: Secondary | ICD-10-CM | POA: Diagnosis not present

## 2021-10-26 DIAGNOSIS — D126 Benign neoplasm of colon, unspecified: Secondary | ICD-10-CM | POA: Diagnosis not present

## 2021-10-26 DIAGNOSIS — M060A Rheumatoid arthritis without rheumatoid factor, other specified site: Secondary | ICD-10-CM | POA: Diagnosis not present

## 2021-10-26 DIAGNOSIS — E042 Nontoxic multinodular goiter: Secondary | ICD-10-CM | POA: Diagnosis not present

## 2021-10-26 DIAGNOSIS — I700 Atherosclerosis of aorta: Secondary | ICD-10-CM | POA: Diagnosis not present

## 2021-10-26 DIAGNOSIS — E785 Hyperlipidemia, unspecified: Secondary | ICD-10-CM | POA: Diagnosis not present

## 2021-10-26 DIAGNOSIS — E890 Postprocedural hypothyroidism: Secondary | ICD-10-CM | POA: Diagnosis not present

## 2021-11-10 NOTE — Progress Notes (Signed)
CARDIOLOGY OFFICE NOTE  Date:  11/15/2021    Brandy Townsend Date of Birth: 07-25-1950 Medical Record #[id number]  PCP:  Reynold Bowen, MD  Cardiologist: Johnsie Cancel    History of Present Illness: 71 y.o.  history of palpitations, atypical chest pain with prior normal Myoview, asthma, RA, hx thyroid ablation, remote tobacco use, and +vaccines for Covid.   Recurrent chest pain  Cardiac CT 01/06/20 calcium score 790 97 th percentile CAD RADS 3 possibly obstructive RCA/OM1/D1 FFR CT positive RCA/D1 Cath 01/08/20 [place] Ramus 70% OM` 60% RCA 55% Medical Rx Did not tolerate imdur with headache    Quit smoking 2012 gained a lot of weight Sees [name] for asthma On Dulera and albuterol as needed   Seen in ED 05/14/20 with weakness and vertigo and vomiting after eating some rare hamburger Hydrated and d/c with meclizine and Phenergan   Her husband is recovering from esophageal cancer and still with feeding tube Has two grand children in [place]  8/15 that see her over summer and two here   BP drops with RA infusion  Verapamil cut back to 120 mg daily 05/11/20   COVID 07/14/20 CXR NAD had 3 vaccines and got antibody titers in June Suspect they were checked due to immunosuppression with Rheumatrex   No angina despite trials and tribulations with husband  Monitor 08/26/21 with some self limited triggered atrial tachycardia Seen by Dr Lovena Le 09/15/21 and he only recommended watchful waiting no AAT or ablation TTE 08/09/21 EF 55-60% no significant valve dx  Tried to vacation at beach this summer but husbands FT fell out and it was a bad experience trying to get it replaced at [facility] Eventually came back to [facility] and had IR do it after he lost 9 lbs    Past Medical History:  Diagnosis Date   Asthma    CAD (coronary artery disease)    Diverticulosis    Graves disease    s/p RAI   High cholesterol    Hypertension    Hypothyroidism    Nephrolithiasis    Osteoporosis    Rheumatoid  arthritis ([place])    Vertigo     Past Surgical History:  Procedure Laterality Date   BRONCHIAL WASHINGS  09/12/2019   Procedure: BRONCHIAL WASHINGS;  Surgeon: Brand Males, MD;  Location: [facility];  Service: Endoscopy;;   INTRAVASCULAR PRESSURE WIRE/FFR STUDY N/A 01/08/2020   Procedure: INTRAVASCULAR PRESSURE WIRE/FFR STUDY;  Surgeon: Martinique, Greysyn Vanderberg M, MD;  Location: [facility];  Service: Cardiovascular;  Laterality: N/A;   LEFT HEART CATH AND CORONARY ANGIOGRAPHY N/A 01/08/2020   Procedure: LEFT HEART CATH AND CORONARY ANGIOGRAPHY;  Surgeon: Martinique, Hyatt Capobianco M, MD;  Location: [facility];  Service: Cardiovascular;  Laterality: N/A;   VIDEO BRONCHOSCOPY N/A 09/12/2019   Procedure: VIDEO BRONCHOSCOPY WITHOUT FLUORO;  Surgeon: Brand Males, MD;  Location: [facility];  Service: Endoscopy;  Laterality: N/A;     Medications: Current Meds  Medication Sig   abatacept (ORENCIA) 250 MG injection Inject 250 mg into the vein See admin instructions. Inject 250 mg IV once every month.   albuterol (VENTOLIN HFA) 108 (90 Base) MCG/ACT inhaler Inhale 1-2 puffs into the lungs every 6 (six) hours as needed for wheezing or shortness of breath.   aspirin EC 81 MG tablet Take 1 tablet (81 mg total) by mouth daily. Swallow whole.   BREO ELLIPTA 100-25 MCG/INH AEPB TAKE 1 PUFF BY MOUTH EVERY DAY   cetirizine (ZYRTEC)  10 MG tablet Take 10 mg by mouth at bedtime as needed for allergies.    HYDROcodone-acetaminophen (NORCO/VICODIN) 5-325 MG tablet Take 1 tablet by mouth every 6 (six) hours as needed for severe pain.   KLOR-CON 10 10 MEQ tablet Take 10 mEq by mouth daily.   levothyroxine (SYNTHROID) 50 MCG tablet Take 50 mcg by mouth daily.    montelukast (SINGULAIR) 10 MG tablet Take 10 mg by mouth daily.   nitroGLYCERIN (NITROSTAT) 0.4 MG SL tablet Place 1 tablet (0.4 mg total) under the tongue every 5 (five) minutes as needed for chest pain.   Probiotic Product (PROBIOTIC GUMMIES PO)  Take by mouth.   pyridOXINE (VITAMIN B-6) 100 MG tablet Take 100 mg by mouth daily.   rosuvastatin (CRESTOR) 10 MG tablet Take 1 tablet (10 mg total) by mouth daily.   torsemide (DEMADEX) 20 MG tablet Take 1 tablet (20 mg total) by mouth daily.   verapamil (CALAN-SR) 240 MG CR tablet Take 1 tablet (240 mg total) by mouth daily.   Vitamin D, Ergocalciferol, (DRISDOL) 1.25 MG (50000 UNIT) CAPS capsule Take 50,000 Units by mouth every 7 (seven) days. Monday     Allergies: Allergies  Allergen Reactions   Cat Hair Extract     Other reaction(s): congestion   Dust Mite Extract     Other reaction(s): Unknown   Morphine And Related Nausea And Vomiting   Macrobid [Nitrofurantoin Monohyd Macro] Other (See Comments)    Lowered potassium, caused aches and pains    Social History: The patient  reports that she quit smoking about 13 years ago. Her smoking use included cigarettes. She has a 20.00 pack-year smoking history. She has never used smokeless tobacco. She reports that she does not drink alcohol and does not use drugs.   Family History: The patient's family history includes Breast cancer in her mother; Cancer (age of onset: 17) in her daughter; Heart disease in her father. FH + for CAD.   Review of Systems: Please see the history of present illness.   All other systems are reviewed and negative.   Physical Exam: VS:  BP 118/62   Pulse 71   Ht 5' 3.5" (1.613 m)   Wt 150 lb (68 kg)   SpO2 97%   BMI 26.15 kg/m  .  BMI Body mass index is 26.15 kg/m.  Wt Readings from Last 3 Encounters:  11/15/21 150 lb (68 kg)  09/15/21 151 lb 12.8 oz (68.9 kg)  08/31/21 149 lb (67.6 kg)   Affect appropriate Healthy:  appears stated age 64: normal Neck supple with no adenopathy JVP normal no bruits no thyromegaly Lungs clear with no wheezing and good diaphragmatic motion Heart:  S1/S2 no murmur, no rub, gallop or click PMI normal Abdomen: benighn, BS positve, no tenderness, no AAA no  bruit.  No HSM or HJR Distal pulses intact with no bruits No edema Neuro non-focal Skin warm and dry No muscular weakness .   LABORATORY DATA:  EKG:   2/17 SR ? Limb lead reversal normal ST segments   Lab Results  Component Value Date   WBC 7.0 07/24/2021   HGB 14.7 07/24/2021   HCT 44.2 07/24/2021   PLT 304 07/24/2021   GLUCOSE 110 (H) 07/24/2021   CHOL 145 03/16/2020   TRIG 66 03/16/2020   HDL 84 03/16/2020   LDLCALC 48 03/16/2020   ALT 23 07/24/2021   AST 17 07/24/2021   NA 141 07/24/2021   K 3.5 07/24/2021   CL  107 07/24/2021   CREATININE 0.89 07/24/2021   BUN 12 07/24/2021   CO2 27 07/24/2021   TSH 1.549 09/01/2020     BNP (last 3 results) No results for input(s): "BNP" in the last 8760 hours.  ProBNP (last 3 results) No results for input(s): "PROBNP" in the last 8760 hours.   Other Studies Reviewed Today:  CARDIAC CATHETERIZATION   Result Date: 01/08/2020  Mid LAD lesion is 20% stenosed.  1st Diag lesion is 40% stenosed.  Ramus lesion is 70% stenosed.  1st Mrg lesion is 60% stenosed.  Prox RCA to Mid RCA lesion is 55% stenosed.  The left ventricular systolic function is normal.  LV end diastolic pressure is normal.  The left ventricular ejection fraction is 55-65% by visual estimate.  1. Nonobstructive CAD. There is modest disease in the mid RCA with normal DFR. Modest disease in the first OM. The ramus branch is tiny. 2. Normal LV function 3. Normal LVEDP. Plan: recommend continued medical therapy. Patient is a candidate for DC today.   CORONARY CT IMPRESSION 12/2019: 1. Calcium score 790 which is 57 th percentile for age and sex and involves all 3 major epicardial vessels   2.  Normal aortic root 2.9 cm   3. CAD RADS 3 possible obstructive CAD tightest lesions appear to be in mid/distal RCA and OM1 Study sent for [facility] CT   Jenkins Rouge     Electronically Signed   By: Eligha Bridegroom  Myoview Study Highlights 04/2019   The left  ventricular ejection fraction is hyperdynamic (>65%). Nuclear stress EF: 77%. Horizontal ST segment depression ST segment depression of 1 mm was noted during stress in the II, III and aVF leads, and returning to baseline after 5-9 minutes of recovery. The study is normal. This is a low risk study. No change from prior study.      ECHO IMPRESSIONS 04/2019   1. Left ventricular ejection fraction, by estimation, is 55 to 60%. The  left ventricle has normal function. The left ventricle has no regional  wall motion abnormalities. Left ventricular diastolic parameters were  normal.   2. Right ventricular systolic function is normal. The right ventricular  size is normal. There is normal pulmonary artery systolic pressure. The  estimated right ventricular systolic pressure is 62.1 mmHg.   3. The mitral valve is grossly normal. No evidence of mitral valve  regurgitation.   4. The aortic valve is tricuspid. Aortic valve regurgitation is not  visualized. No aortic stenosis is present.   5. The inferior vena cava is normal in size with greater than 50%  respiratory variability, suggesting right atrial pressure of 3 mmHg.      ASSESSMENT & PLAN:     1. CAD - Cath 01/08/20  following abnormal CT and in the setting of having chest pain/SOB - to manage medically. She is on statin Did not tolerate nitrates  Fatigue and significant asthma not on beta blocker continue ASA and verapamil   2. HTN - seems to be running lower decrease verapamil to 120 mg daily .   3. HLD - on statin tolerating 10 mg Crestor LDL 48   4. RA - followed by Rheumatology on orencia now Folic acid added to help with hair thinning   5. Pulmonary : f/u [name] asthma, COPD Gold 2    6. Situational stress - not on Rx f/u primary   7. Thyroid:  On synthroid TSH normal   8. COVID:  07/10/20 has had  3 vaccines good antibody titers 09/15/20 improved CXR with NAD  9. Palpitations:  benign self limited atrial tachycardia on  monitor Seen by EP Dr Lovena Le no ablation or AAT recommended Not on beta blocker with asthma and lung dx Continue verapamil  F/U in a year      Signed: Jenkins Rouge, MD  11/15/2021 9:04 AM  [facility] [address] [place], [place]  [address] Phone: [phone] Fax: [phone]

## 2021-11-15 ENCOUNTER — Encounter: Payer: Self-pay | Admitting: Cardiovascular Disease

## 2021-11-15 ENCOUNTER — Ambulatory Visit (INDEPENDENT_AMBULATORY_CARE_PROVIDER_SITE_OTHER): Payer: Medicare Other | Admitting: Cardiovascular Disease

## 2021-11-15 VITALS — BP 118/62 | HR 71 | Ht 63.5 in | Wt 150.0 lb

## 2021-11-15 DIAGNOSIS — M069 Rheumatoid arthritis, unspecified: Secondary | ICD-10-CM | POA: Diagnosis not present

## 2021-11-15 DIAGNOSIS — R002 Palpitations: Secondary | ICD-10-CM

## 2021-11-15 DIAGNOSIS — E785 Hyperlipidemia, unspecified: Secondary | ICD-10-CM | POA: Diagnosis not present

## 2021-11-15 DIAGNOSIS — R5383 Other fatigue: Secondary | ICD-10-CM | POA: Diagnosis not present

## 2021-11-15 DIAGNOSIS — I25119 Atherosclerotic heart disease of native coronary artery with unspecified angina pectoris: Secondary | ICD-10-CM | POA: Diagnosis not present

## 2021-11-15 DIAGNOSIS — Z79899 Other long term (current) drug therapy: Secondary | ICD-10-CM | POA: Diagnosis not present

## 2021-11-15 DIAGNOSIS — M0579 Rheumatoid arthritis with rheumatoid factor of multiple sites without organ or systems involvement: Secondary | ICD-10-CM | POA: Diagnosis not present

## 2021-11-15 DIAGNOSIS — I10 Essential (primary) hypertension: Secondary | ICD-10-CM

## 2021-11-15 NOTE — Patient Instructions (Signed)

## 2021-12-02 ENCOUNTER — Ambulatory Visit (INDEPENDENT_AMBULATORY_CARE_PROVIDER_SITE_OTHER): Payer: Medicare Other | Admitting: Adult Health

## 2021-12-02 ENCOUNTER — Encounter: Payer: Self-pay | Admitting: Adult Health

## 2021-12-02 ENCOUNTER — Other Ambulatory Visit: Payer: Self-pay

## 2021-12-02 VITALS — BP 122/68 | HR 62 | Temp 98.2°F | Ht 63.0 in | Wt 152.6 lb

## 2021-12-02 DIAGNOSIS — M069 Rheumatoid arthritis, unspecified: Secondary | ICD-10-CM | POA: Diagnosis not present

## 2021-12-02 DIAGNOSIS — J4540 Moderate persistent asthma, uncomplicated: Secondary | ICD-10-CM | POA: Diagnosis not present

## 2021-12-02 DIAGNOSIS — J309 Allergic rhinitis, unspecified: Secondary | ICD-10-CM

## 2021-12-02 DIAGNOSIS — I25119 Atherosclerotic heart disease of native coronary artery with unspecified angina pectoris: Secondary | ICD-10-CM

## 2021-12-02 DIAGNOSIS — J4541 Moderate persistent asthma with (acute) exacerbation: Secondary | ICD-10-CM | POA: Diagnosis not present

## 2021-12-02 MED ORDER — ALBUTEROL SULFATE HFA 108 (90 BASE) MCG/ACT IN AERS: 1.0000 | INHALATION_SPRAY | Freq: Four times a day (QID) | RESPIRATORY_TRACT | 5 refills | Status: DC | PRN

## 2021-12-02 NOTE — Patient Instructions (Signed)
Continue on BREO 1 puff daily. Rinse after use.  Albuterol inhaler As needed   Continue on Zyrtec and Singulair daily  May Use Chlorpheniramine '4mg'$  At bedtime  As needed drainage  Saline nasal rinses As needed  Flu shot this fall  Activity as able  Follow up with Dr. [name] in 6 months and As needed

## 2021-12-02 NOTE — Assessment & Plan Note (Signed)
Moderate persistent asthma with allergic phenotype-patient appears to be under good control.  Continue with trigger prevention.  We will add in Chlor-Trimeton at bedtime to help control allergy symptoms as needed.  We will continue on Breo, Singulair, Zyrtec.  Plan  Patient Instructions  Continue on BREO 1 puff daily. Rinse after use.  Albuterol inhaler As needed   Continue on Zyrtec and Singulair daily  May Use Chlorpheniramine '4mg'$  At bedtime  As needed drainage  Saline nasal rinses As needed  Flu shot this fall  Activity as able  Follow up with Dr. [name] in 6 months and As needed

## 2021-12-02 NOTE — Assessment & Plan Note (Signed)
Continue follow-up with rheumatology 

## 2021-12-02 NOTE — Assessment & Plan Note (Signed)
Continue on Zyrtec, Singulair.  Add in Chlor-Trimeton at bedtime as needed

## 2021-12-02 NOTE — Progress Notes (Signed)
_0  ID: Brandy Townsend, female    DOB: 20-Mar-1951, 71 y.o.   MRN: [id number]  Chief Complaint  Patient presents with   Follow-up    Pt states no new issues since LOV.    Referring provider: Reynold Bowen, MD  HPI: 71 year old female former smoker followed for moderate persistent asthma with allergic phenotype Medical history significant for rheumatoid arthritis on Orencia  Abnormal CT chest with upper lobe nodularity -stable since 2011.    TEST/EVENTS :  FENO 03/04/21 57  Feno 12/02/2021 64   CT chest July 22, 2019 showed very mild stable tree-in-bud appearing opacities along the bilateral upper lobes.   Bronchoscopy June 2021 showed cytology positive for benign bronchial cells and pulmonary macrophages.,  AFB negative, BAL negative, M tubercular goes complex negative, fungal negative PCP negative  CT chest July 24, 2021 negative for PE.  No consolidation or suspicious nodules.  Minimum nodularity and the upper lobes unchanged from 2011  12/02/2021 Follow up : Asthma  Patient presents for a 41-monthfollow-up.  Patient has moderate persistent asthma with an allergic phenotype.  She says overall she is doing very well.  Asthma is been under good control.  She denies any flare of cough or wheezing.  ACT score today is 24.  Exhaled nitric oxide testing is at 62 ppb.  She remains on Breo inhaler daily.  She takes Zyrtec and Singulair daily. No increased albuterol use .   She does have rheumatoid arthritis followed by rheumatology on Orencia. Did have flare this summer treated with steroids , none since.   Very active.  Caregiver for her husband. Husband has had esophageal cancer, has feeding tube. Goes in and out of hospital.  Has went back to work partime , 1 day a week. At [employer]  Allergies  Allergen Reactions   Cat Hair Extract     Other reaction(s): congestion   Dust Mite Extract     Other reaction(s): Unknown   Morphine And Related Nausea And Vomiting    Macrobid [Nitrofurantoin Monohyd Macro] Other (See Comments)    Lowered potassium, caused aches and pains    Immunization History  Administered Date(s) Administered   Fluad Quad(high Dose 65+) 01/20/2020   Influenza Whole 01/04/2017   Influenza, High Dose Seasonal PF 01/03/2017, 12/27/2018, 03/03/2021   Influenza, Quadrivalent, Recombinant, Inj, Pf 01/09/2018, 01/11/2019   Influenza,inj,quad, With Preservative 12/26/2016   Influenza-Unspecified 12/26/2013, 12/27/2014, 12/27/2015   PFIZER(Purple Top)SARS-COV-2 Vaccination 04/17/2019, 05/06/2019, 01/31/2020   Pfizer Covid-19 Vaccine Bivalent Booster 144yr& up 01/22/2021   Pneumococcal Conjugate-13 04/28/2014, 05/26/2014   Pneumococcal Polysaccharide-23 03/03/2021   Zoster Recombinat (Shingrix) 01/14/2019, 04/02/2019   Zoster, Live 05/22/2013, 04/01/2019    Past Medical History:  Diagnosis Date   Asthma    CAD (coronary artery disease)    Diverticulosis    Graves disease    s/p RAI   High cholesterol    Hypertension    Hypothyroidism    Nephrolithiasis    Osteoporosis    Rheumatoid arthritis ([facility]   Vertigo     Tobacco History: Social History   Tobacco Use  Smoking Status Former   Packs/day: 1.00   Years: 20.00   Total pack years: 20.00   Types: Cigarettes   Quit date: 08/26/2008   Years since quitting: 13.2  Smokeless Tobacco Never  Tobacco Comments   social smoker   Counseling given: Not Answered Tobacco comments: social smoker   Outpatient Medications Prior to Visit  Medication  Sig Dispense Refill   abatacept (ORENCIA) 250 MG injection Inject 250 mg into the vein See admin instructions. Inject 250 mg IV once every month.     albuterol (VENTOLIN HFA) 108 (90 Base) MCG/ACT inhaler Inhale 1-2 puffs into the lungs every 6 (six) hours as needed for wheezing or shortness of breath. 8 g 5   aspirin EC 81 MG tablet Take 1 tablet (81 mg total) by mouth daily. Swallow whole. 90 tablet 3   BREO ELLIPTA 100-25  MCG/INH AEPB TAKE 1 PUFF BY MOUTH EVERY DAY 60 each 5   cetirizine (ZYRTEC) 10 MG tablet Take 10 mg by mouth at bedtime as needed for allergies.      HYDROcodone-acetaminophen (NORCO/VICODIN) 5-325 MG tablet Take 1 tablet by mouth every 6 (six) hours as needed for severe pain. 12 tablet 0   KLOR-CON 10 10 MEQ tablet Take 10 mEq by mouth daily.  0   levothyroxine (SYNTHROID) 50 MCG tablet Take 50 mcg by mouth daily.      montelukast (SINGULAIR) 10 MG tablet Take 10 mg by mouth daily.     nitroGLYCERIN (NITROSTAT) 0.4 MG SL tablet Place 1 tablet (0.4 mg total) under the tongue every 5 (five) minutes as needed for chest pain. 25 tablet 3   predniSONE (DELTASONE) 10 MG tablet Take 10 mg by mouth as needed.     Probiotic Product (PROBIOTIC GUMMIES PO) Take by mouth.     pyridOXINE (VITAMIN B-6) 100 MG tablet Take 100 mg by mouth daily.     rosuvastatin (CRESTOR) 10 MG tablet Take 1 tablet (10 mg total) by mouth daily. 90 tablet 3   torsemide (DEMADEX) 20 MG tablet Take 1 tablet (20 mg total) by mouth daily. 90 tablet 3   verapamil (CALAN-SR) 240 MG CR tablet Take 1 tablet (240 mg total) by mouth daily. 90 tablet 3   Vitamin D, Ergocalciferol, (DRISDOL) 1.25 MG (50000 UNIT) CAPS capsule Take 50,000 Units by mouth every 7 (seven) days. Monday     No facility-administered medications prior to visit.     Review of Systems:   Constitutional:   No  weight loss, night sweats,  Fevers, chills, fatigue, or  lassitude.  HEENT:   No headaches,  Difficulty swallowing,  Tooth/dental problems, or  Sore throat,                No sneezing, itching, ear ache,  +nasal congestion, post nasal drip,   CV:  No chest pain,  Orthopnea, PND, swelling in lower extremities, anasarca, dizziness, palpitations, syncope.   GI  No heartburn, indigestion, abdominal pain, nausea, vomiting, diarrhea, change in bowel habits, loss of appetite, bloody stools.   Resp: No shortness of breath with exertion or at rest.  No excess  mucus, no productive cough,  No non-productive cough,  No coughing up of blood.  No change in color of mucus.  No wheezing.  No chest wall deformity  Skin: no rash or lesions.  GU: no dysuria, change in color of urine, no urgency or frequency.  No flank pain, no hematuria   MS:  No joint pain or swelling.  No decreased range of motion.  No back pain.    Physical Exam  BP 122/68 (BP Location: Left Arm, Patient Position: Sitting, Cuff Size: Normal)   Pulse 62   Temp 98.2 F (36.8 C) (Oral)   Ht _0  (1.6 m)   Wt 152 lb 9.6 oz (69.2 kg)   SpO2 98%   BMI  27.03 kg/m   GEN: A/Ox3; pleasant , NAD, well nourished    HEENT:  [place]/AT,   NOSE-clear, THROAT-clear, no lesions, no postnasal drip or exudate noted.   NECK:  Supple w/ fair ROM; no JVD; normal carotid impulses w/o bruits; no thyromegaly or nodules palpated; no lymphadenopathy.    RESP  Clear  P & A; w/o, wheezes/ rales/ or rhonchi. no accessory muscle use, no dullness to percussion  CARD:  RRR, no m/r/g, no peripheral edema, pulses intact, no cyanosis or clubbing.  GI:   Soft & nt; nml bowel sounds; no organomegaly or masses detected.   Musco: Warm bil, no deformities or joint swelling noted.   Neuro: alert, no focal deficits noted.    Skin: Warm, no lesions or rashes    Lab Results:  CBC    Component Value Date/Time   WBC 7.0 07/24/2021 0903   RBC 4.87 07/24/2021 0903   HGB 14.7 07/24/2021 0903   HCT 44.2 07/24/2021 0903   PLT 304 07/24/2021 0903   MCV 90.8 07/24/2021 0903   MCH 30.2 07/24/2021 0903   MCHC 33.3 07/24/2021 0903   RDW 13.2 07/24/2021 0903   LYMPHSABS 2.2 03/31/2021 1553   MONOABS 0.6 03/31/2021 1553   EOSABS 0.3 03/31/2021 1553   BASOSABS 0.1 03/31/2021 1553    BMET    Component Value Date/Time   NA 141 07/24/2021 0903   NA 141 05/13/2019 1626   K 3.5 07/24/2021 0903   CL 107 07/24/2021 0903   CO2 27 07/24/2021 0903   GLUCOSE 110 (H) 07/24/2021 0903   BUN 12 07/24/2021 0903   BUN  26 05/13/2019 1626   CREATININE 0.89 07/24/2021 0903   CALCIUM 9.4 07/24/2021 0903   GFRNONAA >60 07/24/2021 0903   GFRAA >60 07/27/2019 1105    BNP    Component Value Date/Time   BNP 28.3 02/01/2017 2032    ProBNP No results found for: "PROBNP"  Imaging: No results found.       Latest Ref Rng & Units 01/23/2020    8:43 AM 01/13/2017    8:40 AM  PFT Results  FVC-Pre L 2.08  2.43   FVC-Predicted Pre % 69  79   FVC-Post L 2.41  2.46   FVC-Predicted Post % 80  80   Pre FEV1/FVC % % 72  73   Post FEV1/FCV % % 75  76   FEV1-Pre L 1.51  1.77   FEV1-Predicted Pre % 66  75   FEV1-Post L 1.81  1.87   DLCO uncorrected ml/min/mmHg 18.49  16.68   DLCO UNC% % 97  72   DLCO corrected ml/min/mmHg 18.38  17.68   DLCO COR %Predicted % 96  77   DLVA Predicted % 108  83   TLC L 5.38  5.90   TLC % Predicted % 109  120   RV % Predicted % 142  162     Lab Results  Component Value Date   NITRICOXIDE 34 01/23/2020        Assessment & Plan:   Moderate asthma without complication Moderate persistent asthma with allergic phenotype-patient appears to be under good control.  Continue with trigger prevention.  We will add in Chlor-Trimeton at bedtime to help control allergy symptoms as needed.  We will continue on Breo, Singulair, Zyrtec.  Plan  Patient Instructions  Continue on BREO 1 puff daily. Rinse after use.  Albuterol inhaler As needed   Continue on Zyrtec and Singulair daily  May Use  Chlorpheniramine 31m At bedtime  As needed drainage  Saline nasal rinses As needed  Flu shot this fall  Activity as able  Follow up with Dr. RChase Callerin 6 months and As needed         Chronic allergic rhinitis Continue on Zyrtec, Singulair.  Add in Chlor-Trimeton at bedtime as needed  Rheumatoid arthritis ([facility] Continue follow-up with rheumatology     TRexene Edison NP 12/02/2021

## 2021-12-09 DIAGNOSIS — M0579 Rheumatoid arthritis with rheumatoid factor of multiple sites without organ or systems involvement: Secondary | ICD-10-CM | POA: Diagnosis not present

## 2021-12-16 DIAGNOSIS — M0579 Rheumatoid arthritis with rheumatoid factor of multiple sites without organ or systems involvement: Secondary | ICD-10-CM | POA: Diagnosis not present

## 2021-12-22 IMAGING — US US PELVIS COMPLETE WITH TRANSVAGINAL
1 series · 13 of 25 positions shown · non-contrast
Comparison: CT abdomen/pelvis dated 06/05/2020

CLINICAL DATA: Left lower quadrant pain, dilated pelvic veins on CT



[Series 1: us pelvis complete with transvaginal · 13 of 110 slices shown]
[im 1/110]
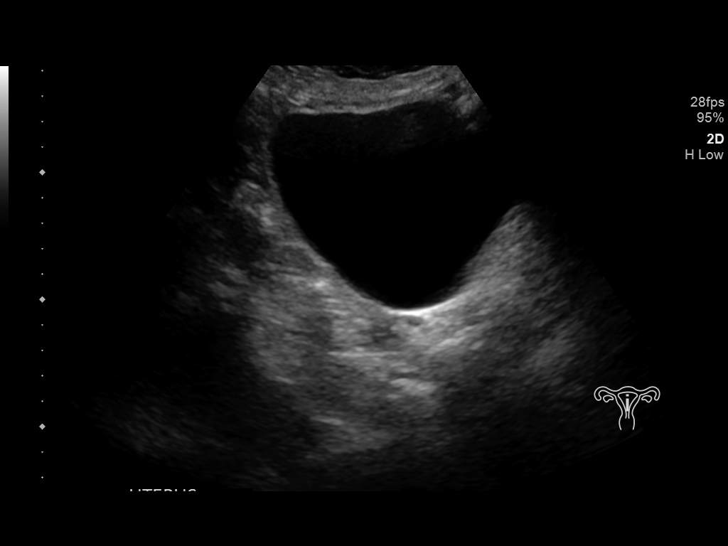
[im 10/110]
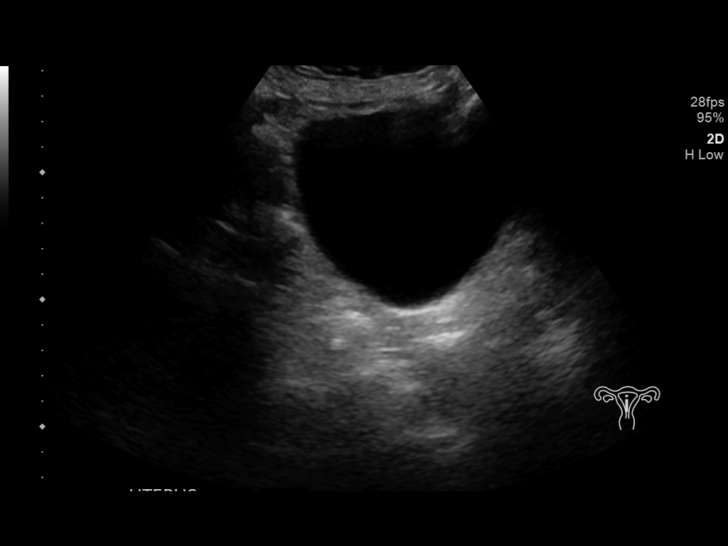
[im 19/110]
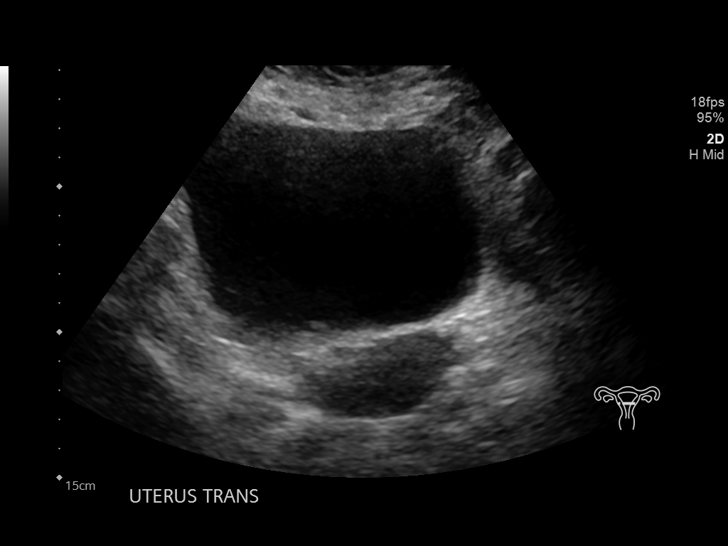
[im 28/110]
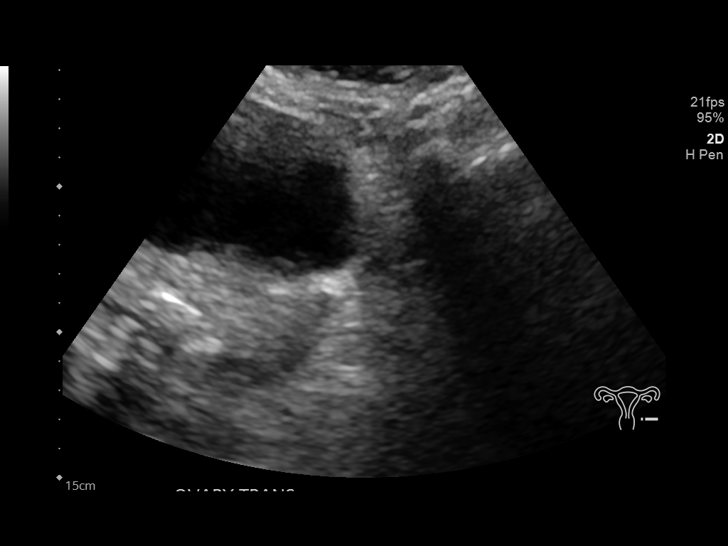
[im 37/110]
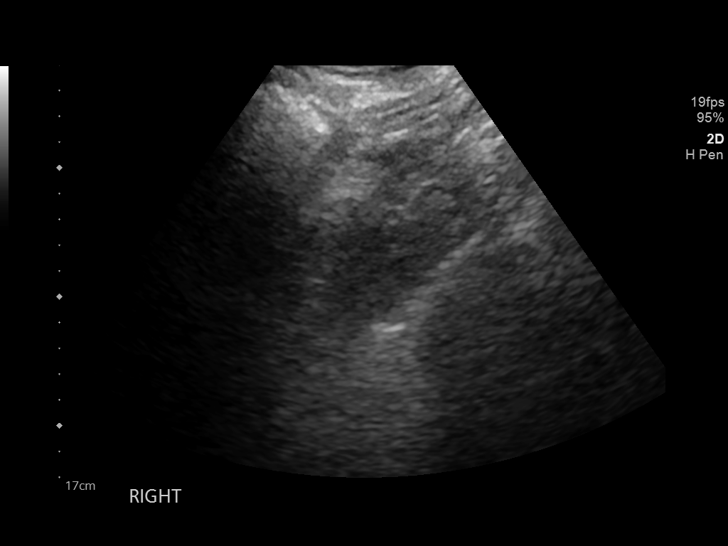
[im 46/110]
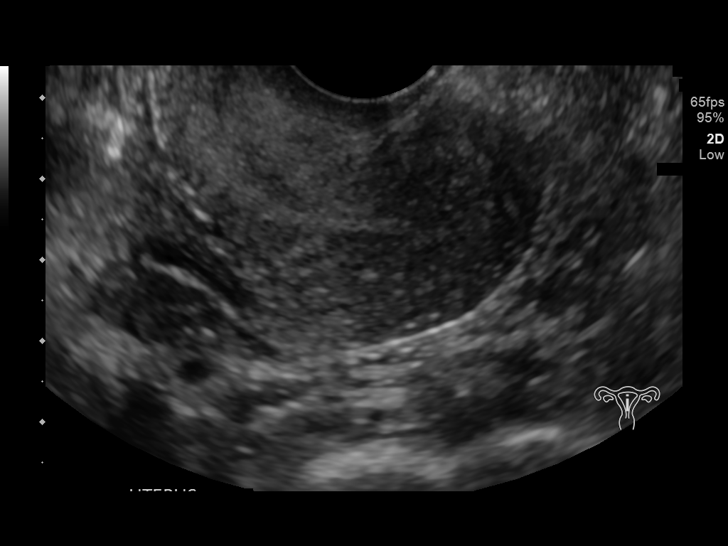
[im 55/110]
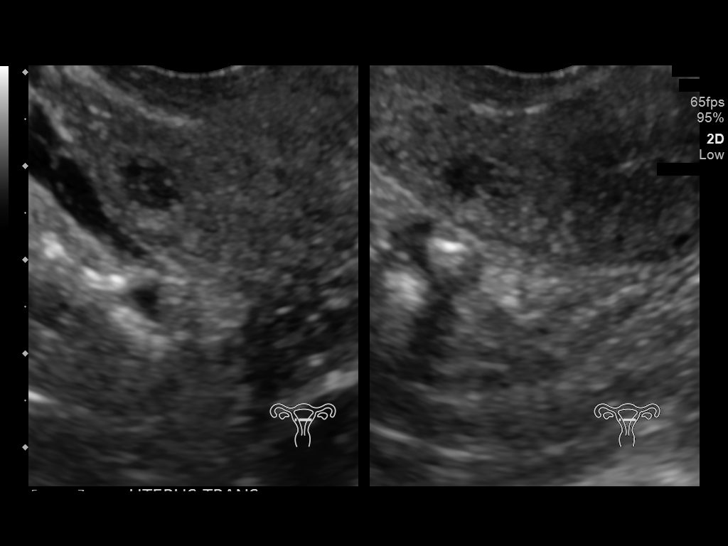
[im 64/110]
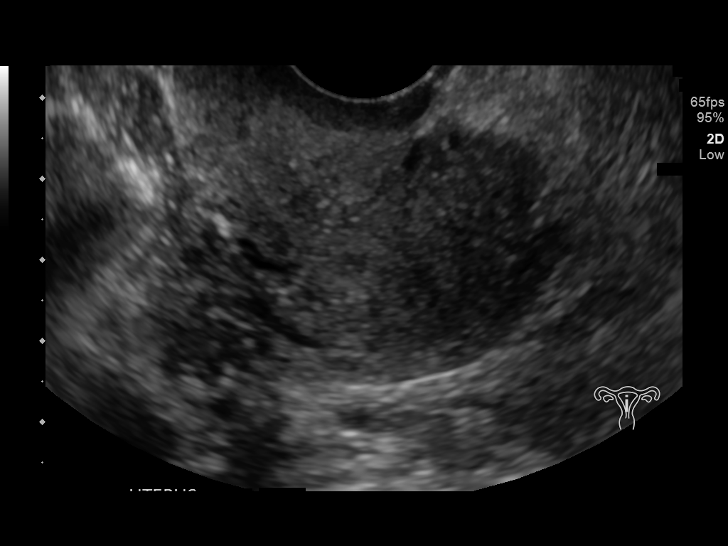
[im 73/110]
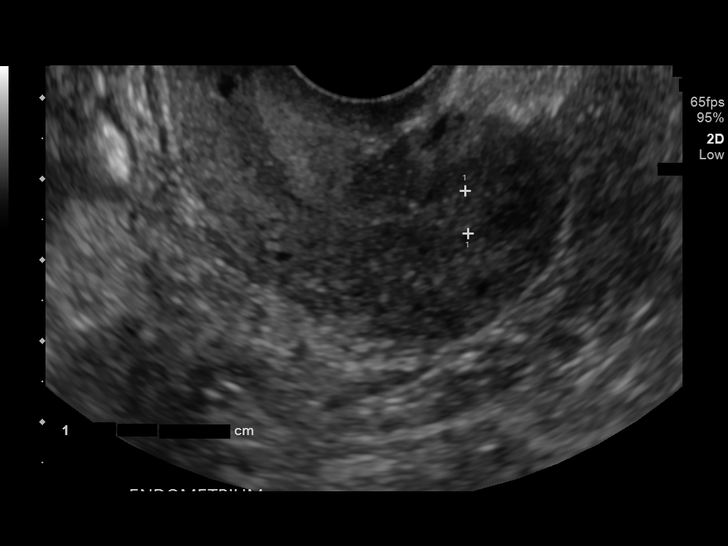
[im 82/110]
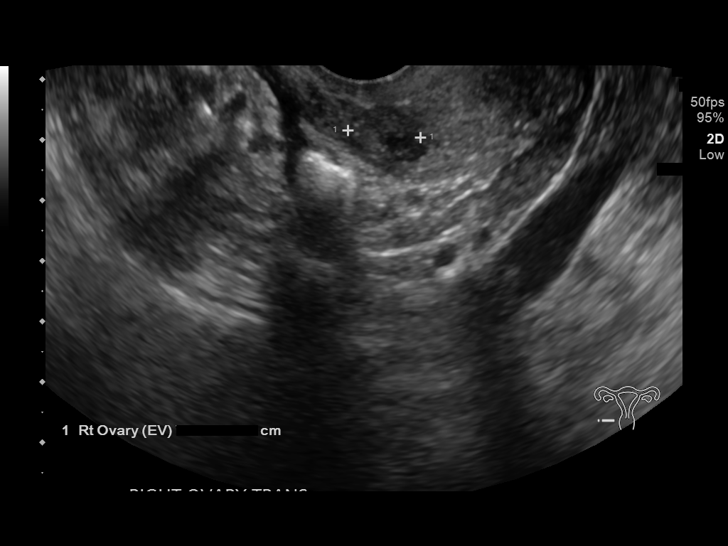
[im 91/110]
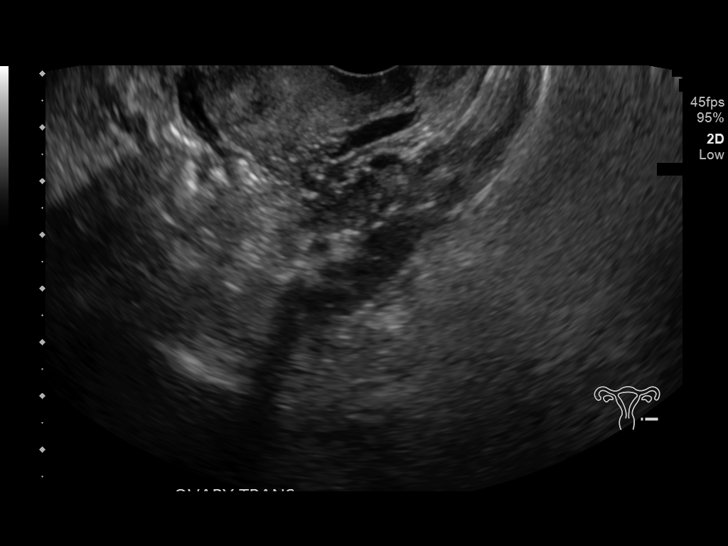
[im 100/110]
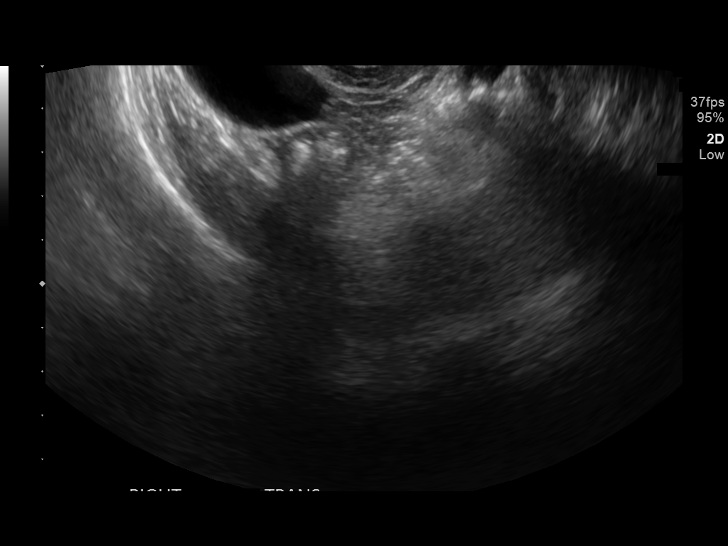
[im 110/110]
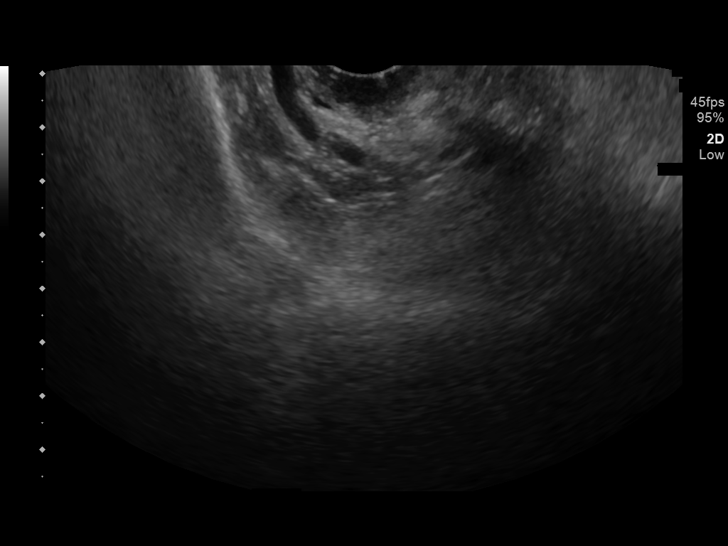

[13 of 25 positions shown; findings below may reference images not displayed]

FINDINGS: Uterus

Measurements: 5.3 x 3.1 x 4.3 cm = volume: 36.5 mL. 6 x 5 x 5 mm
probable intramural fibroid in the left lower uterine segment.

Endometrium

Thickness: 5 mm.  No focal abnormality visualized.

Right ovary

Measurements: 2.5 x 1.1 x 1.2 cm = volume: 1.8 mL. Normal
appearance/no adnexal mass.

Left ovary

Measurements: 1.7 x 0.8 x 1.6 cm = volume: 1.7 mL. Normal
appearance/no adnexal mass.

Other findings

No free fluid.  Prominent parametrial and left gonadal vessels.
IMPRESSION: 6 mm probable intramural fibroid in the left lower uterine segment.

Prominent parametrial and left gonadal vessels, as noted on prior
CT. This is nonspecific but (on occasion) can be a cause of pelvic
pain in a patient with pelvic congestion syndrome.

## 2021-12-31 ENCOUNTER — Other Ambulatory Visit (HOSPITAL_BASED_OUTPATIENT_CLINIC_OR_DEPARTMENT_OTHER): Payer: Self-pay

## 2021-12-31 DIAGNOSIS — Z23 Encounter for immunization: Secondary | ICD-10-CM | POA: Diagnosis not present

## 2021-12-31 MED ORDER — INFLUENZA VAC A&B SA ADJ QUAD 0.5 ML IM PRSY
PREFILLED_SYRINGE | INTRAMUSCULAR | 0 refills | Status: DC
  Filled 2021-12-31: qty 0.5, 1d supply, fill #0

## 2022-01-10 ENCOUNTER — Other Ambulatory Visit: Payer: Self-pay | Admitting: Cardiovascular Disease

## 2022-01-17 DIAGNOSIS — M0579 Rheumatoid arthritis with rheumatoid factor of multiple sites without organ or systems involvement: Secondary | ICD-10-CM | POA: Diagnosis not present

## 2022-01-18 DIAGNOSIS — M0579 Rheumatoid arthritis with rheumatoid factor of multiple sites without organ or systems involvement: Secondary | ICD-10-CM | POA: Diagnosis not present

## 2022-01-18 DIAGNOSIS — Z6827 Body mass index (BMI) 27.0-27.9, adult: Secondary | ICD-10-CM | POA: Diagnosis not present

## 2022-01-18 DIAGNOSIS — M810 Age-related osteoporosis without current pathological fracture: Secondary | ICD-10-CM | POA: Diagnosis not present

## 2022-01-18 DIAGNOSIS — I10 Essential (primary) hypertension: Secondary | ICD-10-CM | POA: Diagnosis not present

## 2022-01-18 DIAGNOSIS — F418 Other specified anxiety disorders: Secondary | ICD-10-CM | POA: Diagnosis not present

## 2022-01-18 DIAGNOSIS — Z79899 Other long term (current) drug therapy: Secondary | ICD-10-CM | POA: Diagnosis not present

## 2022-01-18 DIAGNOSIS — R9389 Abnormal findings on diagnostic imaging of other specified body structures: Secondary | ICD-10-CM | POA: Diagnosis not present

## 2022-01-18 DIAGNOSIS — E663 Overweight: Secondary | ICD-10-CM | POA: Diagnosis not present

## 2022-01-27 DIAGNOSIS — M0579 Rheumatoid arthritis with rheumatoid factor of multiple sites without organ or systems involvement: Secondary | ICD-10-CM | POA: Diagnosis not present

## 2022-02-08 ENCOUNTER — Other Ambulatory Visit (HOSPITAL_BASED_OUTPATIENT_CLINIC_OR_DEPARTMENT_OTHER): Payer: Self-pay

## 2022-02-14 DIAGNOSIS — Z79899 Other long term (current) drug therapy: Secondary | ICD-10-CM | POA: Diagnosis not present

## 2022-02-14 DIAGNOSIS — M0579 Rheumatoid arthritis with rheumatoid factor of multiple sites without organ or systems involvement: Secondary | ICD-10-CM | POA: Diagnosis not present

## 2022-03-10 ENCOUNTER — Other Ambulatory Visit (HOSPITAL_BASED_OUTPATIENT_CLINIC_OR_DEPARTMENT_OTHER): Payer: Self-pay

## 2022-03-10 MED ORDER — AREXVY 120 MCG/0.5ML IM SUSR
INTRAMUSCULAR | 0 refills | Status: DC
  Filled 2022-03-10: qty 0.5, 1d supply, fill #0

## 2022-03-13 IMAGING — CT CT HEAD W/O CM
4 series · 16 of 47 positions shown, 18 images · non-contrast
Comparison: 09/02/2018

CLINICAL DATA: Syncopal episode. Dizziness. Blurred vision.
Pounding headache.

EXAM:
CT HEAD WITHOUT CONTRAST
TECHNIQUE: Contiguous axial images were obtained from the base of the skull
through the vertex without intravenous contrast.

[Series 3: head wo · axial · 0.40mm/px · z∈[-78,+38]mm · 7 of 31 slices shown, 9 images]
[im 4/31  brain]
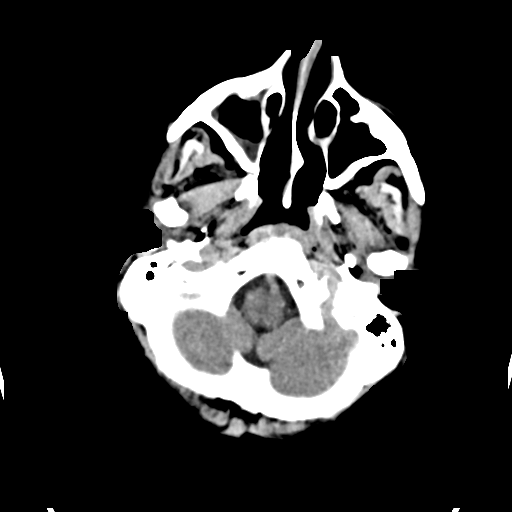
[im 4/31  bone]
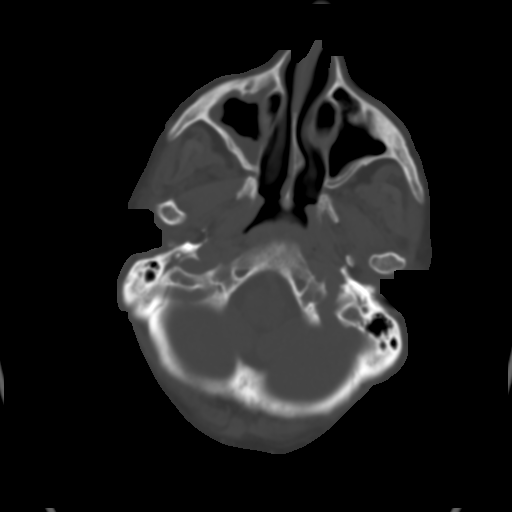
[im 8/31  brain]
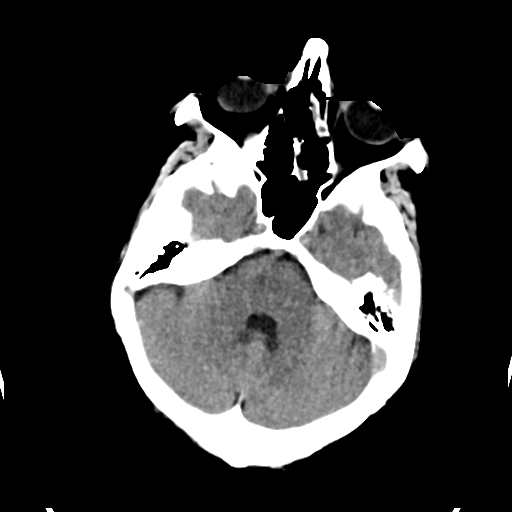
[im 12/31  brain]
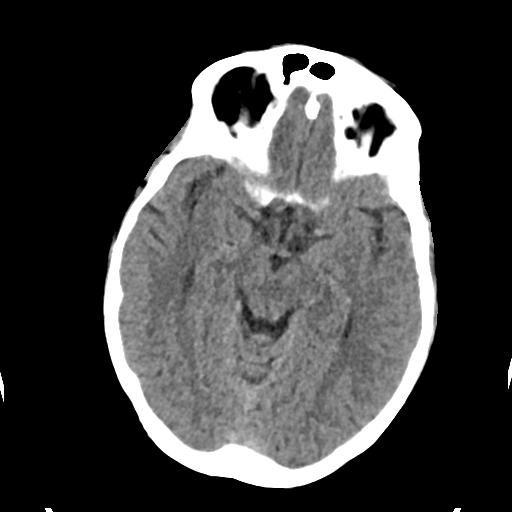
[im 16/31  brain]
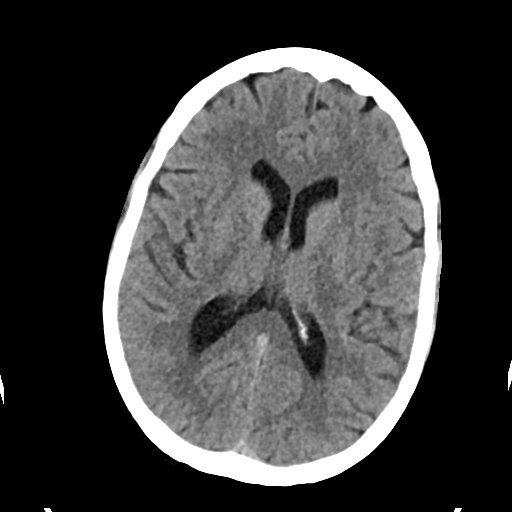
[im 19/31  brain]
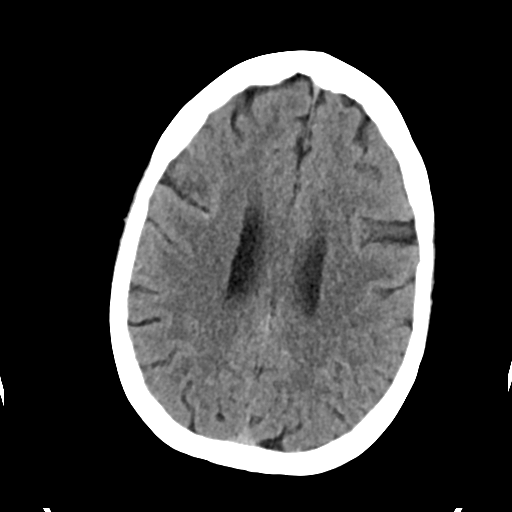
[im 19/31  bone]
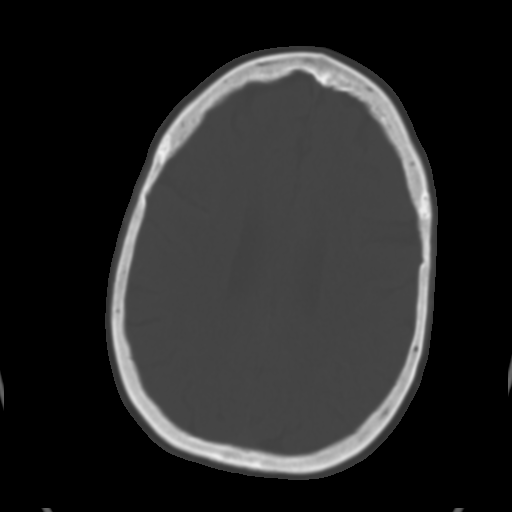
[im 23/31  brain]
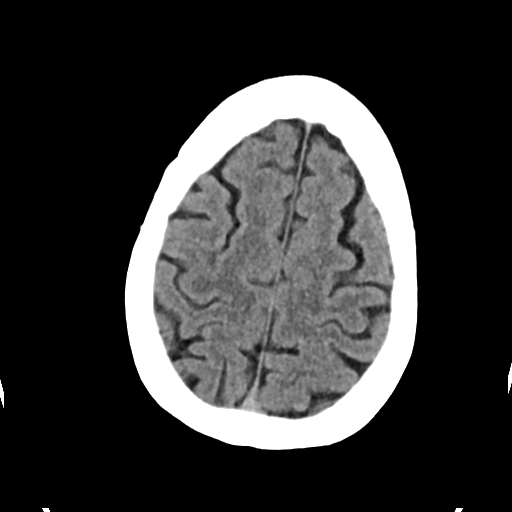
[im 27/31  brain]
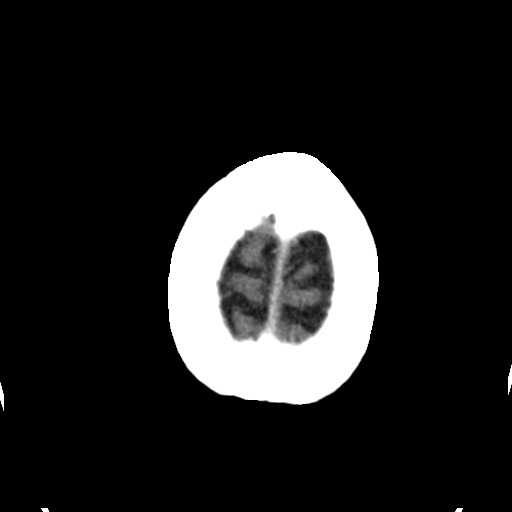

[Series 4: head bone · axial · 0.40mm/px · z∈[-78,-46]mm · 3 of 78 slices shown]
[im 8/78  bone]
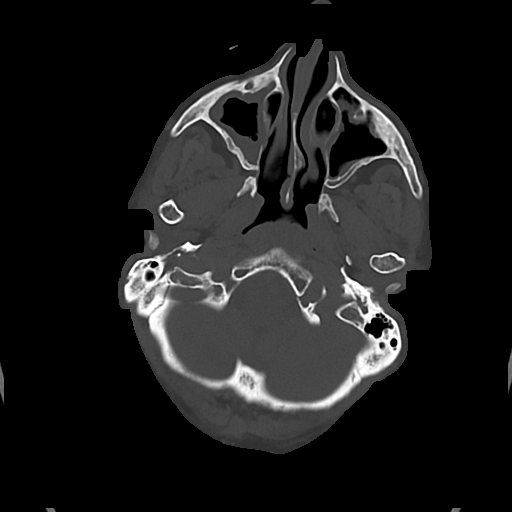
[im 16/78  bone]
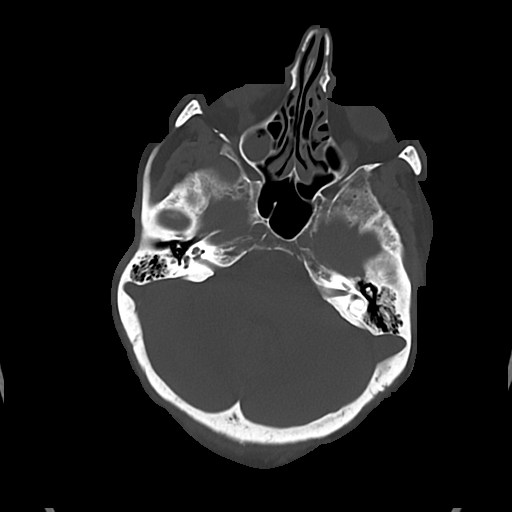
[im 24/78  bone]
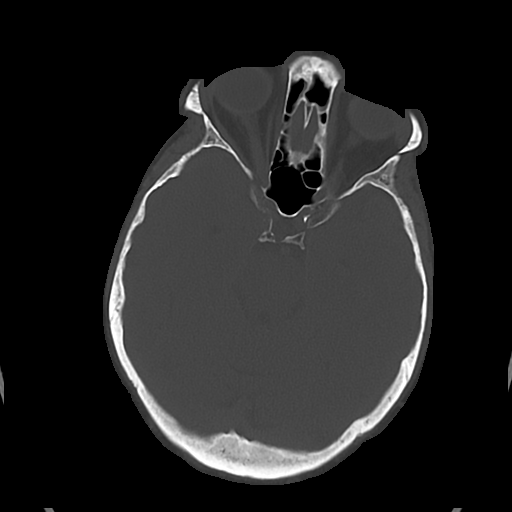

[Series 5: cor soft · coronal · 0.29mm/px · 3 of 64 slices shown]
[im 22/64  brain]
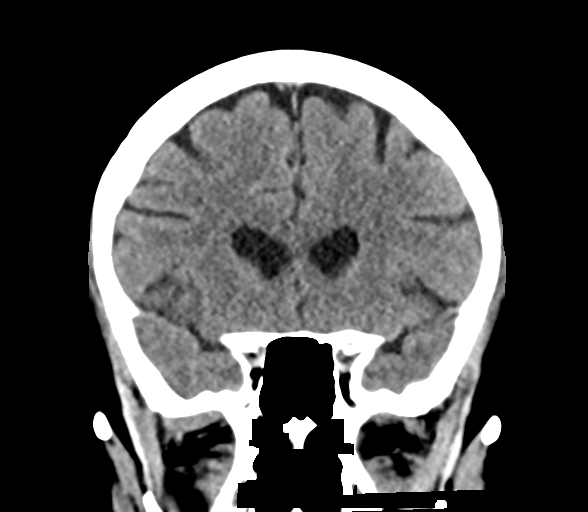
[im 29/64  brain]
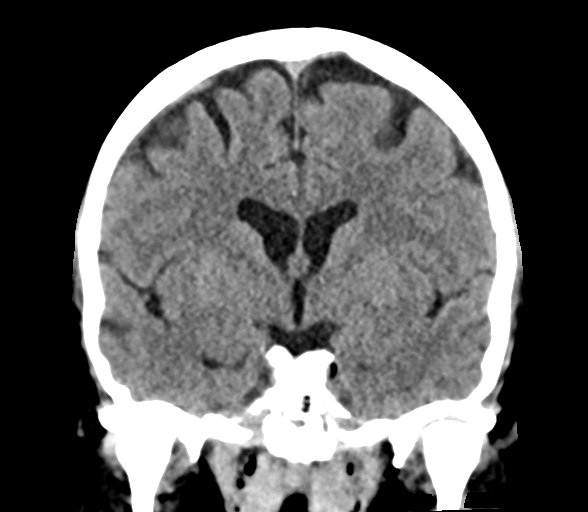
[im 36/64  brain]
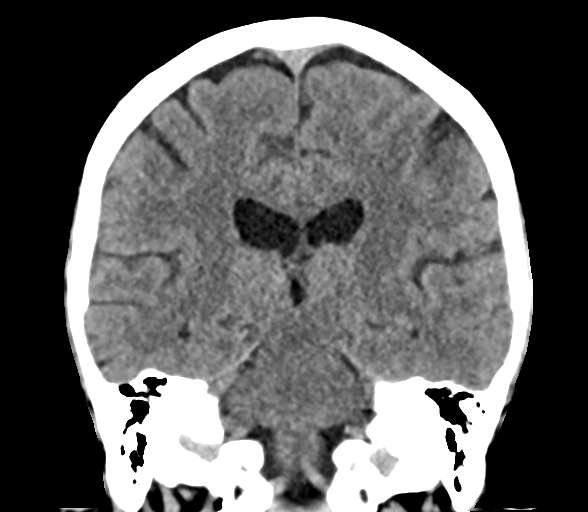

[Series 6: sag soft · sagittal · 0.29mm/px · 3 of 57 slices shown]
[im 19/57  brain]
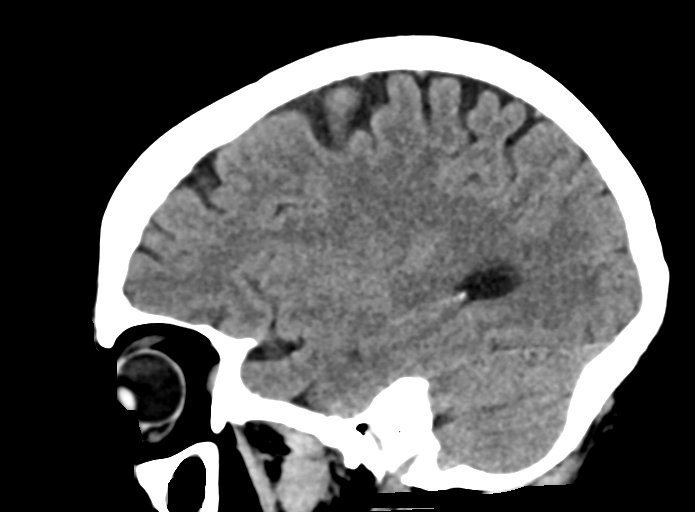
[im 29/57  brain]
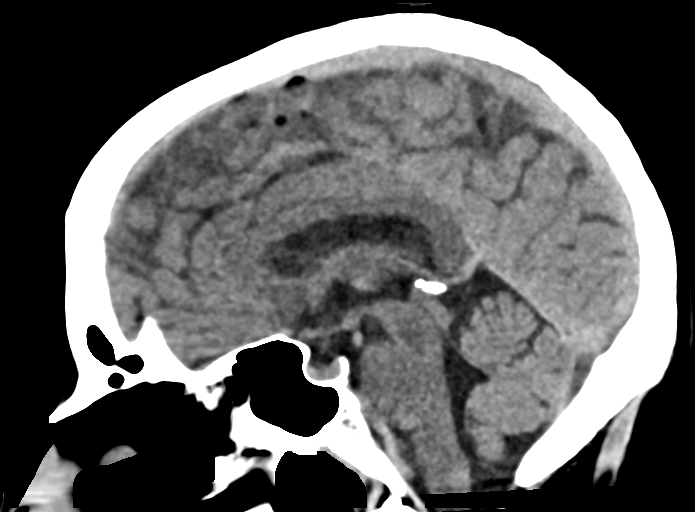
[im 38/57  brain]
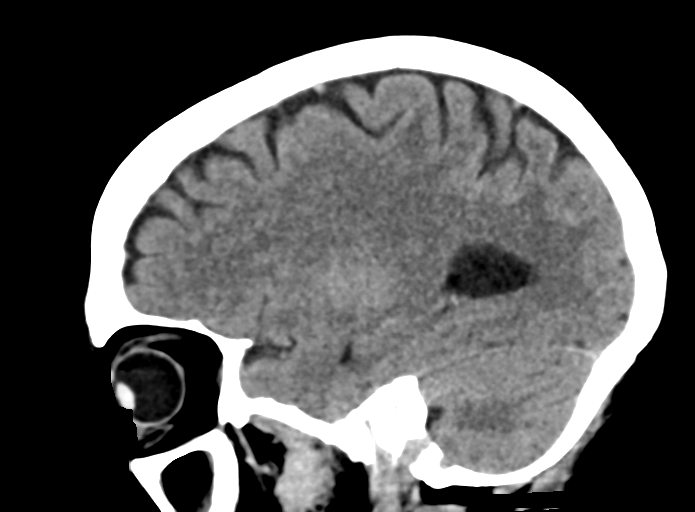

[16 of 47 positions shown; findings below may reference images not displayed]

FINDINGS: Brain: Normal appearing cerebral hemispheres and posterior fossa
structures. Normal size and position of the ventricles. No
intracranial hemorrhage, mass lesion or CT evidence of acute
infarction.

Vascular: No hyperdense vessel or unexpected calcification.

Skull: Normal. Negative for fracture or focal lesion.

Sinuses/Orbits: Moderate right maxillary sinus mucosal thickening.
Retained secretions in both maxillary sinuses. Left maxillary sinus
retention cysts. Bilateral ethmoid sinus mucosal thickening, left
greater than right. Unremarkable orbits.

Other: None.
IMPRESSION: 1. No intracranial abnormality.
2. Chronic sinusitis.

## 2022-03-17 DIAGNOSIS — Z79899 Other long term (current) drug therapy: Secondary | ICD-10-CM | POA: Diagnosis not present

## 2022-03-17 DIAGNOSIS — R5383 Other fatigue: Secondary | ICD-10-CM | POA: Diagnosis not present

## 2022-03-17 DIAGNOSIS — M0579 Rheumatoid arthritis with rheumatoid factor of multiple sites without organ or systems involvement: Secondary | ICD-10-CM | POA: Diagnosis not present

## 2022-03-19 ENCOUNTER — Encounter (HOSPITAL_BASED_OUTPATIENT_CLINIC_OR_DEPARTMENT_OTHER): Payer: Self-pay | Admitting: Emergency Medicine

## 2022-03-19 ENCOUNTER — Other Ambulatory Visit: Payer: Self-pay

## 2022-03-19 ENCOUNTER — Emergency Department (HOSPITAL_BASED_OUTPATIENT_CLINIC_OR_DEPARTMENT_OTHER): Payer: Medicare Other | Admitting: Radiology

## 2022-03-19 ENCOUNTER — Emergency Department (HOSPITAL_BASED_OUTPATIENT_CLINIC_OR_DEPARTMENT_OTHER)
Admission: EM | Admit: 2022-03-19 | Discharge: 2022-03-19 | Disposition: A | Discharge status: Home / Self Care | Payer: Medicare Other | Attending: Emergency Medicine | Admitting: Emergency Medicine

## 2022-03-19 DIAGNOSIS — J101 Influenza due to other identified influenza virus with other respiratory manifestations: Secondary | ICD-10-CM | POA: Diagnosis not present

## 2022-03-19 DIAGNOSIS — J00 Acute nasopharyngitis [common cold]: Secondary | ICD-10-CM | POA: Diagnosis not present

## 2022-03-19 DIAGNOSIS — J45909 Unspecified asthma, uncomplicated: Secondary | ICD-10-CM | POA: Diagnosis not present

## 2022-03-19 DIAGNOSIS — Z7982 Long term (current) use of aspirin: Secondary | ICD-10-CM | POA: Diagnosis not present

## 2022-03-19 DIAGNOSIS — Z7951 Long term (current) use of inhaled steroids: Secondary | ICD-10-CM | POA: Insufficient documentation

## 2022-03-19 DIAGNOSIS — I2510 Atherosclerotic heart disease of native coronary artery without angina pectoris: Secondary | ICD-10-CM | POA: Diagnosis not present

## 2022-03-19 DIAGNOSIS — Z1152 Encounter for screening for COVID-19: Secondary | ICD-10-CM | POA: Insufficient documentation

## 2022-03-19 DIAGNOSIS — R059 Cough, unspecified: Secondary | ICD-10-CM | POA: Diagnosis present

## 2022-03-19 DIAGNOSIS — E876 Hypokalemia: Secondary | ICD-10-CM | POA: Insufficient documentation

## 2022-03-19 LAB — CBC WITH DIFFERENTIAL/PLATELET
Abs Immature Granulocytes: 0.02 10*3/uL (ref 0.00–0.07)
Basophils Absolute: 0 10*3/uL (ref 0.0–0.1)
Basophils Relative: 0 %
Eosinophils Absolute: 0.2 10*3/uL (ref 0.0–0.5)
Eosinophils Relative: 3 %
HCT: 40.3 % (ref 36.0–46.0)
Hemoglobin: 12.7 g/dL (ref 12.0–15.0)
Immature Granulocytes: 0 %
Lymphocytes Relative: 20 %
Lymphs Abs: 1.4 10*3/uL (ref 0.7–4.0)
MCH: 28.4 pg (ref 26.0–34.0)
MCHC: 31.5 g/dL (ref 30.0–36.0)
MCV: 90.2 fL (ref 80.0–100.0)
Monocytes Absolute: 0.9 10*3/uL (ref 0.1–1.0)
Monocytes Relative: 13 %
Neutro Abs: 4.2 10*3/uL (ref 1.7–7.7)
Neutrophils Relative %: 64 %
Platelets: 254 10*3/uL (ref 150–400)
RBC: 4.47 MIL/uL (ref 3.87–5.11)
RDW: 13.5 % (ref 11.5–15.5)
WBC: 6.6 10*3/uL (ref 4.0–10.5)
nRBC: 0 % (ref 0.0–0.2)

## 2022-03-19 LAB — RESP PANEL BY RT-PCR (RSV, FLU A&B, COVID)  RVPGX2
Influenza A by PCR: POSITIVE — AB
Influenza B by PCR: NEGATIVE
Resp Syncytial Virus by PCR: NEGATIVE
SARS Coronavirus 2 by RT PCR: NEGATIVE

## 2022-03-19 LAB — BASIC METABOLIC PANEL
Anion gap: 9 (ref 5–15)
BUN: 9 mg/dL (ref 8–23)
CO2: 27 mmol/L (ref 22–32)
Calcium: 8.9 mg/dL (ref 8.9–10.3)
Chloride: 105 mmol/L (ref 98–111)
Creatinine, Ser: 0.72 mg/dL (ref 0.44–1.00)
GFR, Estimated: >60 mL/min (ref >60)
Glucose, Bld: 129 mg/dL — ABNORMAL HIGH (ref 70–99)
Potassium: 3.3 mmol/L — ABNORMAL LOW (ref 3.5–5.1)
Sodium: 141 mmol/L (ref 135–145)

## 2022-03-19 MED ORDER — OSELTAMIVIR PHOSPHATE 75 MG PO CAPS: 75.0000 mg | ORAL_CAPSULE | Freq: Two times a day (BID) | ORAL | 0 refills | Status: DC

## 2022-03-19 MED ORDER — BENZONATATE 100 MG PO CAPS
100.0000 mg | ORAL_CAPSULE | Freq: Three times a day (TID) | ORAL | 0 refills | Status: DC | PRN
Start: 2022-03-19 — End: 2023-01-13

## 2022-03-19 MED ORDER — DEXAMETHASONE SODIUM PHOSPHATE 10 MG/ML IJ SOLN
10.0000 mg | Freq: Once | INTRAMUSCULAR | Status: AC
  Administered 2022-03-19: 10 mg via INTRAVENOUS
  Filled 2022-03-19: qty 1

## 2022-03-19 MED ORDER — POTASSIUM CHLORIDE CRYS ER 20 MEQ PO TBCR
40.0000 meq | EXTENDED_RELEASE_TABLET | Freq: Once | ORAL | Status: AC
  Administered 2022-03-19: 20 meq via ORAL
  Filled 2022-03-19: qty 2

## 2022-03-19 MED ORDER — LACTATED RINGERS IV BOLUS
1000.0000 mL | Freq: Once | INTRAVENOUS | Status: AC
  Administered 2022-03-19: 1000 mL via INTRAVENOUS

## 2022-03-19 MED ORDER — OXYMETAZOLINE HCL 0.05 % NA SOLN
1.0000 | Freq: Once | NASAL | Status: AC
Start: 2022-03-19 — End: 2022-03-19
  Administered 2022-03-19: 1 via NASAL
  Filled 2022-03-19: qty 30

## 2022-03-19 NOTE — Discharge Instructions (Addendum)
You have been seen in the Emergency Department (ED)  today for cough and shortness of breath.  You tested positive for influenza today.  You were given IV fluids and IV steroids.  Please drink plenty of fluids.  You may use Tylenol and Motrin, as written on the box, as needed for fever or discomfort.  You can use the nasal Afrin spray for congestion.  You can take the Tamiflu for treatment of flu.  You can discuss use of Tamiflu further with the pharmacist.  Your chest x-ray showed no pneumonia.  Your labs showed only a mildly low potassium which we repleted here in the ER.  You can have this rechecked with your primary care doctor.  Return to the Emergency Department (ED)  if you have worsening trouble breathing, chest pain, difficulty swallowing, neck pain or stiffness or other symptoms that concern you.  Please make an appointment to follow up with your primary care doctor within one week to assure improvement or resolution in symptoms.

## 2022-03-19 NOTE — ED Provider Notes (Signed)
Brandy Townsend Provider Note   CSN: [id number] Arrival date & time: 03/19/22  7824     History  Chief Complaint  Patient presents with   Cough    Brandy Townsend is a 71 y.o. female.  With PMH of thyroid disease, RA, CAD, asthma who presents with cough, congestion and shortness of breath for 3 days.  Patient says she has mainly been having upper respiratory symptoms with cough and congestion.  She has had no fevers and cough has been dry.  She has had no GI symptoms no vomiting, no diarrhea, no abdominal pain or chest pain.  She was recently waiting in the waiting room with her husband for about 7 hours this past week and thinks she may have picked up infection from that.  She feels dry but is still tolerating p.o.  She has been having intermittent wheezing but uses inhalers at home with relief.  She has not been on any antibiotics or steroids recently.  She is on infusion therapy for her rheumatoid arthritis.   Of note, Last echo performed 08/09/2021 with LVEF 55 to 60% and RV function normal.   Cough      Home Medications Prior to Admission medications   Medication Sig Start Date End Date Taking? Authorizing Provider  benzonatate (TESSALON) 100 MG capsule Take 1 capsule (100 mg total) by mouth 3 (three) times daily as needed for cough. 03/19/22  Yes Elgie Congo, MD  oseltamivir (TAMIFLU) 75 MG capsule Take 1 capsule (75 mg total) by mouth every 12 (twelve) hours. 03/19/22  Yes Elgie Congo, MD  abatacept (ORENCIA) 250 MG injection Inject 250 mg into the vein See admin instructions. Inject 250 mg IV once every month.    [provider]  albuterol (VENTOLIN HFA) 108 (90 Base) MCG/ACT inhaler Inhale 1-2 puffs into the lungs every 6 (six) hours as needed for wheezing or shortness of breath. 12/02/21   Parrett, Fonnie Mu, NP  aspirin EC 81 MG tablet Take 1 tablet (81 mg total) by mouth daily. Swallow whole. 12/17/19   Burtis Junes, NP   BREO ELLIPTA 100-25 MCG/INH AEPB TAKE 1 PUFF BY MOUTH EVERY DAY 01/04/21   Brand Males, MD  cetirizine (ZYRTEC) 10 MG tablet Take 10 mg by mouth at bedtime as needed for allergies.     [provider]  KLOR-CON 10 10 MEQ tablet Take 10 mEq by mouth daily. 08/11/14   [provider]  levothyroxine (SYNTHROID) 50 MCG tablet Take 50 mcg by mouth daily.  08/16/18   [provider]  montelukast (SINGULAIR) 10 MG tablet Take 10 mg by mouth daily. 11/17/20   [provider]  nitroGLYCERIN (NITROSTAT) 0.4 MG SL tablet Place 1 tablet (0.4 mg total) under the tongue every 5 (five) minutes as needed for chest pain. 05/27/20   Josue Hector, MD  Probiotic Product (PROBIOTIC GUMMIES PO) Take by mouth.    [provider]  rosuvastatin (CRESTOR) 10 MG tablet TAKE 1 TABLET BY MOUTH EVERY DAY 01/11/22   Josue Hector, MD  torsemide (DEMADEX) 20 MG tablet Take 1 tablet (20 mg total) by mouth daily. 05/15/19   Burtis Junes, NP  verapamil (CALAN-SR) 240 MG CR tablet Take 1 tablet (240 mg total) by mouth daily. 04/23/21   Josue Hector, MD  Vitamin D, Ergocalciferol, (DRISDOL) 1.25 MG (50000 UNIT) CAPS capsule Take 50,000 Units by mouth every 7 (seven) days. Monday    [provider]  Allergies    Cat hair extract, Dust mite extract, Morphine and related, and Macrobid [nitrofurantoin monohyd macro]    Review of Systems   Review of Systems  Respiratory:  Positive for cough.     Physical Exam Updated Vital Signs BP 116/63   Pulse 67   Temp 98.6 F (37 C) (Oral)   Resp 17   Ht '5\' 3"'$  (1.6 m)   Wt 67.1 kg   SpO2 96%   BMI 26.22 kg/m  Physical Exam Constitutional: Alert and oriented. Well appearing and in no distress. Eyes: Conjunctivae are normal. ENT      Head: Normocephalic and atraumatic.      Nose: + congestion.      Neck: No stridor. Cardiovascular: S1, S2,  Normal and symmetric distal pulses are present in all extremities.Warm  and well perfused. Respiratory: Normal respiratory effort. Breath sounds are normal.  O2 sat 93-97 on RA. Gastrointestinal: Soft and nondistended Musculoskeletal: Normal range of motion in all extremities.      Right lower leg: No tenderness or edema.      Left lower leg: No tenderness or edema. Neurologic: Normal speech and language.  Moving all extremities equally.  Sensation grossly intact.  No gross focal neurologic deficits are appreciated. Skin: Skin is warm, dry and intact. No rash noted. Psychiatric: Mood and affect are normal. Speech and behavior are normal.  ED Results / Procedures / Treatments   Labs (all labs ordered are listed, but only abnormal results are displayed) Labs Reviewed  RESP PANEL BY RT-PCR (RSV, FLU A&B, COVID)  RVPGX2 - Abnormal; Notable for the following components:      Result Value   Influenza A by PCR POSITIVE (*)    All other components within normal limits  BASIC METABOLIC PANEL - Abnormal; Notable for the following components:   Potassium 3.3 (*)    Glucose, Bld 129 (*)    All other components within normal limits  CBC WITH DIFFERENTIAL/PLATELET    EKG None  Radiology DG Chest 2 View  Result Date: 03/19/2022 CLINICAL DATA:  Cold like symptoms EXAM: CHEST - 2 VIEW COMPARISON:  07/24/2021 FINDINGS: Normal heart size and mediastinal contours. No acute infiltrate or edema. No effusion or pneumothorax. No acute osseous findings. IMPRESSION: Negative for pneumonia Electronically Signed   By: Jorje Guild M.D.   On: 03/19/2022 08:33    Procedures Procedures    Medications Ordered in ED Medications  potassium chloride SA (KLOR-CON M) CR tablet 40 mEq (has no administration in time range)  lactated ringers bolus 1,000 mL (1,000 mLs Intravenous New Bag/Given 03/19/22 0958)  dexamethasone (DECADRON) injection 10 mg (10 mg Intravenous Given 03/19/22 1000)  oxymetazoline (AFRIN) 0.05 % nasal spray 1 spray (1 spray Each Nare Given 03/19/22 1004)     ED Course/ Medical Decision Making/ A&P                           Medical Decision Making Brandy Townsend is a 71 y.o. female.  With PMH of thyroid disease, RA, CAD, asthma who presents with cough, congestion and shortness of breath for 3 days.   Patient has symptoms likely secondary to viral URI such as COVID, influenza or RSV among multiple other etiologies especially with high incidence within current population.  Would also consider possible bacterial pneumonia however less likely with no increased work of breathing, no fevers, no productive cough.  I have no concern for sepsis with no  fever, no hypotension, no tachycardia, no increased work of breathing .  She is satting 93% to 97% on room air without any increased work of breathing.  She was given albuterol inhaler upon arrival and had no wheezing on my exam.  Swabs were ordered positive for influenza A.  Chest x-ray obtained which I personally reviewed no consolidation concerning for pneumonia, no pleural effusion, no pneumothorax, no pulmonary edema.  Basic labs obtained which I personally reviewed which showed normal white blood cell count 6.6 no anemia hemoglobin 12.7.  Creatinine 0.2 within normal limits.  Mild hypokalemia 3.3 which was repleted orally in the ED.Marland Kitchen  Patient given IV fluids per her request and IV Decadron for symptom relief as well as nasal Afrin.  She was discharged with prescription for Tamiflu and as needed Tessalon Perles and advised close follow-up with PCP and strict return precautions.  She is hemodynamically stable without any increased work of breathing, no hypoxia and is safe for discharge home with return precautions.  She is in agreement with plan.  Amount and/or Complexity of Data Reviewed Labs: ordered. Radiology: ordered.  Risk OTC drugs. Prescription drug management.    Final Clinical Impression(s) / ED Diagnoses Final diagnoses:  Influenza A  Hypokalemia    Rx / DC Orders ED Discharge  Orders          Ordered    oseltamivir (TAMIFLU) 75 MG capsule  Every 12 hours        03/19/22 0848    benzonatate (TESSALON) 100 MG capsule  3 times daily PRN        03/19/22 0848              Elgie Congo, MD 03/19/22 1039

## 2022-03-19 NOTE — ED Triage Notes (Signed)
Patient c/o cough, shortness and head congestion onset Thursday night.

## 2022-03-24 ENCOUNTER — Telehealth: Payer: Self-pay | Admitting: Internal Medicine

## 2022-03-24 MED ORDER — PREDNISONE 10 MG PO TABS: ORAL_TABLET | ORAL | 0 refills | Status: AC

## 2022-03-24 NOTE — Telephone Encounter (Signed)
Spoke with Brandy Townsend who she was diagnosed with flu on Saturday 03/19/22. Brandy Townsend was given IV fluids and IV steroids. She was sent home with an Rx  for Tessalon pearls and Tamiflu. Brandy Townsend states she is improving but her left lower back hurts when she is coughing and she is coughing all the time. Brandy Townsend states she is taking Breo as directed and does have albuterol as needed. Brandy Townsend can you please advise as Dr. [name] is unavailable? Thank you

## 2022-03-24 NOTE — Telephone Encounter (Signed)
Possible asthma exacerbation/bronchitis from viral illness. Please send prednisone taper (10 mg tablets) - 4 tabs for 2 days, then 3 tabs for 2 days, 2 tabs for 2 days, then 1 tab for 2 days, then stop. Take in AM with food. Ensure she is eating and drinking well and able to keep medications down. Continue to use albuterol as needed for SOB, wheezing, chest tightness and Breo inhaler as ordered. Needs OV if symptoms do not improve with CXR. Thanks.

## 2022-03-24 NOTE — Telephone Encounter (Signed)
Called and spoke to patient and went over the recommendations from [place]. And advised patient that after she finishes the prednisone if she is still not feeling better to call office so we can get her seen in office. She verbalized understanding. While on phone I did verify her pharmacy of where to send the prednisone. Nothing further needed

## 2022-03-25 DIAGNOSIS — J4540 Moderate persistent asthma, uncomplicated: Secondary | ICD-10-CM | POA: Diagnosis not present

## 2022-03-25 DIAGNOSIS — F418 Other specified anxiety disorders: Secondary | ICD-10-CM | POA: Diagnosis not present

## 2022-03-25 DIAGNOSIS — R051 Acute cough: Secondary | ICD-10-CM | POA: Diagnosis not present

## 2022-03-25 DIAGNOSIS — M060A Rheumatoid arthritis without rheumatoid factor, other specified site: Secondary | ICD-10-CM | POA: Diagnosis not present

## 2022-03-25 DIAGNOSIS — J101 Influenza due to other identified influenza virus with other respiratory manifestations: Secondary | ICD-10-CM | POA: Diagnosis not present

## 2022-03-31 ENCOUNTER — Telehealth: Payer: Self-pay

## 2022-03-31 NOTE — Telephone Encounter (Signed)
     Patient  visit on 03/19/2022  at [facility] was for influenza; hypokalemia.  Have you been able to follow up with your primary care physician? Yes  The patient was or was not able to obtain any needed medicine or equipment. Patient obtained medication.  Are there diet recommendations that you are having difficulty following? No  Patient expresses understanding of discharge instructions and education provided has no other needs at this time.    [place] [facility] Care Guide   [email or web address]  ?? [phone]   Website: [email or web address]  [email or web address]

## 2022-04-08 ENCOUNTER — Other Ambulatory Visit: Payer: Self-pay | Admitting: Cardiovascular Disease

## 2022-04-15 DIAGNOSIS — M0579 Rheumatoid arthritis with rheumatoid factor of multiple sites without organ or systems involvement: Secondary | ICD-10-CM | POA: Diagnosis not present

## 2022-04-27 DIAGNOSIS — Z6826 Body mass index (BMI) 26.0-26.9, adult: Secondary | ICD-10-CM | POA: Diagnosis not present

## 2022-04-27 DIAGNOSIS — Z79899 Other long term (current) drug therapy: Secondary | ICD-10-CM | POA: Diagnosis not present

## 2022-04-27 DIAGNOSIS — M0579 Rheumatoid arthritis with rheumatoid factor of multiple sites without organ or systems involvement: Secondary | ICD-10-CM | POA: Diagnosis not present

## 2022-04-27 DIAGNOSIS — M810 Age-related osteoporosis without current pathological fracture: Secondary | ICD-10-CM | POA: Diagnosis not present

## 2022-04-27 DIAGNOSIS — E663 Overweight: Secondary | ICD-10-CM | POA: Diagnosis not present

## 2022-04-27 DIAGNOSIS — R9389 Abnormal findings on diagnostic imaging of other specified body structures: Secondary | ICD-10-CM | POA: Diagnosis not present

## 2022-05-09 DIAGNOSIS — K5730 Diverticulosis of large intestine without perforation or abscess without bleeding: Secondary | ICD-10-CM | POA: Diagnosis not present

## 2022-05-09 DIAGNOSIS — E042 Nontoxic multinodular goiter: Secondary | ICD-10-CM | POA: Diagnosis not present

## 2022-05-09 DIAGNOSIS — I2510 Atherosclerotic heart disease of native coronary artery without angina pectoris: Secondary | ICD-10-CM | POA: Diagnosis not present

## 2022-05-09 DIAGNOSIS — E890 Postprocedural hypothyroidism: Secondary | ICD-10-CM | POA: Diagnosis not present

## 2022-05-09 DIAGNOSIS — J4540 Moderate persistent asthma, uncomplicated: Secondary | ICD-10-CM | POA: Diagnosis not present

## 2022-05-09 DIAGNOSIS — I700 Atherosclerosis of aorta: Secondary | ICD-10-CM | POA: Diagnosis not present

## 2022-05-09 DIAGNOSIS — E785 Hyperlipidemia, unspecified: Secondary | ICD-10-CM | POA: Diagnosis not present

## 2022-05-09 DIAGNOSIS — M060A Rheumatoid arthritis without rheumatoid factor, other specified site: Secondary | ICD-10-CM | POA: Diagnosis not present

## 2022-05-09 DIAGNOSIS — I10 Essential (primary) hypertension: Secondary | ICD-10-CM | POA: Diagnosis not present

## 2022-05-09 DIAGNOSIS — D126 Benign neoplasm of colon, unspecified: Secondary | ICD-10-CM | POA: Diagnosis not present

## 2022-05-09 DIAGNOSIS — M810 Age-related osteoporosis without current pathological fracture: Secondary | ICD-10-CM | POA: Diagnosis not present

## 2022-05-17 DIAGNOSIS — Z79899 Other long term (current) drug therapy: Secondary | ICD-10-CM | POA: Diagnosis not present

## 2022-05-17 DIAGNOSIS — M0579 Rheumatoid arthritis with rheumatoid factor of multiple sites without organ or systems involvement: Secondary | ICD-10-CM | POA: Diagnosis not present

## 2022-05-17 DIAGNOSIS — R5383 Other fatigue: Secondary | ICD-10-CM | POA: Diagnosis not present

## 2022-06-07 ENCOUNTER — Ambulatory Visit (INDEPENDENT_AMBULATORY_CARE_PROVIDER_SITE_OTHER): Payer: Medicare Other | Admitting: Internal Medicine

## 2022-06-07 ENCOUNTER — Encounter: Payer: Self-pay | Admitting: Internal Medicine

## 2022-06-07 VITALS — BP 98/58 | HR 73 | Temp 98.2°F | Ht 63.0 in | Wt 149.0 lb

## 2022-06-07 DIAGNOSIS — J4540 Moderate persistent asthma, uncomplicated: Secondary | ICD-10-CM

## 2022-06-07 LAB — POCT EXHALED NITRIC OXIDE: FeNO level (ppb): 52

## 2022-06-07 MED ORDER — FLUTICASONE FUROATE-VILANTEROL 100-25 MCG/ACT IN AEPB: INHALATION_SPRAY | RESPIRATORY_TRACT | 5 refills | Status: DC

## 2022-06-07 MED ORDER — AIRSUPRA 90-80 MCG/ACT IN AERO: 2.0000 | INHALATION_SPRAY | Freq: Four times a day (QID) | RESPIRATORY_TRACT | 5 refills | Status: DC | PRN

## 2022-06-07 NOTE — Patient Instructions (Addendum)
ICD-10-CM   1. Moderate persistent asthma without complication  123456        Out of breo x 1 month but on singulair Stable disease. No flare up However, Feno is elevated at 52ppb  Plan  - Take script for AIR-SUPRA 2 puff as needed - If too expensive restart breo  - take script for both - Use albuterol as needed   Follow-up - Return in 6 months or sooner if needed  -check ACT score and also FeNO at follow-up

## 2022-06-07 NOTE — Addendum Note (Signed)
Addended byOralia Rud M on: 06/07/2022 03:44 PM   Modules accepted: Orders

## 2022-06-07 NOTE — Progress Notes (Signed)
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 Wilson Singer, intermittent chest tightness, no wheezing or cough   wheezng started roughly the same year she quit smoking assoc with wt gain x 35lb wt gain typically better p prednisone and better since spring 2017  On asthmanex 2 each am with rare saba needed but then Feb 10 2016 chest tightness and did not try saba > ER 02/21/16 with w/u neg w/u except for a/f levels both max sinuses> Rx Rosen= prednsione > back to baseline at time of ov with no cough or sob or chest tightness and  Not limited by breathing from desired activities including treadmill at 3.5 mph flat x 1.5 miles  Other than assoc of flares with cold/sinus infections >> No obvious patterns in day to day or daytime variability or assoc excess/ purulent sputum or mucus plugs or hemoptysis or cp or chest tightness, subjective wheeze or overt   hb symptoms. No unusual exp hx or h/o childhood pna/ asthma or knowledge of premature birth. 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  No new complaints. 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 She has not had to use her albuterol inhaler at all and has only been using the symbicort 1-2 puffs every am.   symb 80 1-2 each am at most never using the 2 bid max  Not limited by breathing from desired activities   No cough   No obvious day to day  or daytime variability or assoc excess/ purulent sputum or mucus plugs or hemoptysis or cp or chest tightness, subjective wheeze or overt sinus or hb symptoms. No unusual exp hx or h/o childhood pna/ asthma or knowledge of premature birth.  Sleeping ok flat without nocturnal  or early am exacerbation  of respiratory  c/o's or need for noct saba. Also denies any obvious fluctuation of symptoms with weather or environmental changes or other aggravating or alleviating factors except as outlined above   Current Allergies, Complete Past Medical History, Past Surgical History, Family History, and Social History were reviewed in [employer] record.  ROS  The following are not active complaints unless bolded Hoarseness, sore throat, dysphagia, dental problems, itching, sneezing,  nasal congestion or discharge of excess mucus or purulent secretions,  ear ache,   fever, chills, sweats, unintended wt loss or wt gain, classically pleuritic or exertional cp,  orthopnea pnd or leg swelling, presyncope, palpitations, abdominal pain, anorexia, nausea, vomiting, diarrhea  or change in bowel habits or change in bladder habits, change in stools or change in urine, dysuria, hematuria,  rash, arthralgias, visual complaints, headache, numbness, weakness or ataxia or problems with walking or coordination,  change in mood/affect or memory.       OV 08/29/2019  Subjective:  Patient ID: Brandy Townsend, female , DOB: 1951/01/09 , age 72 y.o. , MRN: [id number] , ADDRESS: [place] [id number]   08/29/2019 -   Chief Complaint  Patient presents with   Consult    Abnormal CT, hx of asthma. hx of low oxygen level and cough.    Transfer of care from Dr. [name] to Dr. [name].  HPI Brandy Townsend 72 y.o. -referred for possible abnormalities in the CT scan of the chest in April 2021 in the setting of rheumatoid arthritis 2018 (RF + and CCP > 250 per [name] over phone) and being on [place] and longstanding history of allergic asthma on dulera. 20 pack former smoking   72 year old female.  Former Radio producer.  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  She reports a many year many decade history of allergic asthma which flares up in the fall.  Between episodes she is not on any maintenance treatment.  In the fall when it flares up her previous primary care physician Dr. [name] retired] would give her antibiotics and prednisone one course and this would help it.  However in 2020 when the flareup happened she could not get access to the prednisone and antibiotics because the primary care physician retired and in the setting of [place] it was televisit.  By the end of 2020 she ended up with 2 rounds of antibiotics and prednisone that helped clear things up.  In the interim in the last 4 years she has had a diagnosis of  rheumatoid arthritis.  She was on methotrexate but had raised liver function test.  This seemed to help.  She failed Plaquenil and then was started on leflunomide which she is on currently.   In 2018 she did have spirometry and DLCO on pulmonary function testing that shows hyperinflation and air trapping with reduced DLCO but also normal spirometry.  In the backdrop of this in January 2020 when she underwent her first Covid vaccine.  Then in early February 2020 when she had a second Covid vaccine.  24 hours later she got hypotensive tachycardic and also hypoxemic and ended up in the ER.  I reviewed  the ER notes.  She was observed in the ER for whole day and her symptoms and signs resolved and the hypoxemia also resolved.  She was discharged with a prednisone course.  She says subsequent to that she was short of breath and fatigue for 2 weeks and this also resolved.  She did see cardiology in February 2021.  She was reassured by a normal stress test.  After that she switched primary care physicians to Dr. Reynold Bowen.  She is now established with a new rheumatologist at [facility] and is seen [name] the 49 assistant for the first time on August 27, 2019 2 days ago.  She also had a thyroid biopsy which was nondiagnostic.  On the rheumatology visit 2 days ago it was felt her rheumatoid arthritis was active.  Blood has been drawn and work-up is in progress.  Through all this primary care physician did do CT chest without contrast.  I personally visualized this.  There is evidence of tree-in-bud that is mild and subtle.  Therefore she is referred here.  However at this point in time she is asymptomatic from a respiratory standpoint no wheezing no orthopnea no proximal nocturnal dyspnea no dyspnea on exertion no cough.  However in terms of rheumatoid arthritis there is plans to increase her immunomodulation. She has been started on low dose pred now by [name]. Currently labs  pending are CRP, Quant Gold and Hep Panel   D/w [name] later -> she is considering adding DMARD -discussed and she is supportive of getting bronch and rule out opportunistic infection  ROS - per HPI  IMPRESSION: CT chest visualized 1. Very mild, stable tree-in-bud appearing opacities along the anterolateral aspect of the bilateral upper lobes. Sequelae associated with atypical infection cannot be excluded. 2. Predominant stable thyroid goiter.   Aortic Atherosclerosis (ICD10-I70.0).     Electronically Signed   By: Virgina Norfolk M.D.   On: 07/22/2019 22:34    OV 10/17/2019  Subjective:  Patient ID: Brandy Townsend, female , DOB: Sep 27, 1950 , age 13 y.o. , MRN: [id number] , ADDRESS: [place] [id number]   10/17/2019 -   Chief Complaint  Patient presents with   Follow-up    Pt is here to discuss results of the bronch. Pt states she has been doing good since last visit and denies any complaints.     HPI Brandy Townsend 72 y.o. -returns for follow-up.  At this point in time she is on prednisone 20 mg/day.  She has gained weight.  She is got some bruises.  She is not happy about her prednisone.  She denies any respiratory complaints.  Mid June 2020 when she underwent bronchoscopy with lavage.  I reviewed the results.  She is got some mild neutrophilia but organisms such as PCP and TB and fungus are negative.  She really wants to get started on a DMA RD so she can come down or get off prednisone.  I discussed with rheumatology PA [name] and she is of the understanding.  I stated there is no ILD.  They are going to focus on joint treatment.  Patient did indicate that in the fall she is at risk for respiratory exacerbations.  But she lives nearby and is agreeable to a 9-45-monthfollow-up.  She is supposed to get pulmonary function test today but that was not scheduled by our office.  Results for Brandy, Townsend(MRN [id number] as of 10/17/2019 16:38  Ref.  Range 09/12/2019 08:59  Monocyte-Macrophage-Serous Fluid Latest Ref Range: 50 - 90 % 38 (L)  Other Cells, Fluid Latest Units: % CORRELATE WITH CYTOLOGY.  Fluid Type-FCT Unknown Bronch Lavag  Color, Fluid Latest Ref Range: YELLOW  COLORLESS (A)  Total Nucleated Cell Count, Fluid Latest Ref Range: 0 - 1,000 cu mm 15  Lymphs, Fluid Latest Units: % 4  Eos, Fluid Latest Units: % 2  Appearance, Fluid Latest Ref Range: CLEAR  CLOUDY (A)  Neutrophil Count, Fluid Latest Ref Range: 0 - 25 % 56 (H)   ROS - per HPI   Patient ID: Brandy Townsend, female    DOB: [birth year], 72 y.o.   MRN: [id number]  Chief Complaint  Patient presents with   Follow-up    Cough     Referring provider: Reynold Bowen, MD    12/10/2019 Acute OV : Cough  HPI: 71 year old female former smoker followed for abnormal CT chest with pulmonary infiltrates Medical history significant for rheumatoid arthritis    Patient presents for an acute office visit.  She complains of 1 week increased cough,congestion , and wheezing . Mucus is clear , hard to cough anything up.  She has underlying rheumatoid arthritis and is recently had her maintenance regimen changed to methotrexate in infliximab.    She had recent Covid test has been negative.  Her Covid vaccines are up-to-date.  She denies any fever or loss of taste or smell.   Patient is being followed for mild pulmonary infiltrates noted on CT chest.  She is negative for interstitial lung disease.  She underwent bronchoscopy in June 2021 that showed negative cultures and BAL.  Patient is followed by rheumatology and is on methotrexate and Renflexis     TEST/EVENTS :  In 2018 she did have spirometry and DLCO on pulmonary function testing that shows hyperinflation and air trapping with reduced DLCO but also normal spirometry.  CT chest July 22, 2019 showed very mild stable tree-in-bud appearing opacities along the bilateral upper lobes.  Bronchoscopy June 2021 showed cytology  positive for benign bronchial cells and pulmonary macrophages.,  AFB negative, BAL negative, M tubercular goes complex negative, fungal negative PCP negative   OV 01/23/2020  Subjective:  Patient ID: Brandy Townsend, female , DOB: [birth year] , age 41 y.o. , MRN: [id number] , ADDRESS: [place] [id number] PCP Reynold Bowen, MD Patient Care Team: Reynold Bowen, MD as PCP - General (Endocrinology) Vania Rea, MD as PCP - OBGYN (Obstetrics and Gynecology) Josue Hector, MD as PCP - Cardiology (Cardiology)  This Provider for this visit: Treatment Team:  Attending Provider: Brand Males, MD    01/23/2020 -   Chief Complaint  Patient presents with   Follow-up    Pt states she has been doing okay since last visit. States her breathing has been doing okay. Still becomes SOB but it happens out of nowhere and she isn't sure why or if it is coming due to her heart.   Rheumatoid arthritis with immunosuppression and mild pulmonary infiltrates.  BAL with neutrophilia blood culture negative-June 2021  Long history of allergic asthma previously on Dulera.  HPI Brandy Townsend 72 y.o. -returns for follow-up.  Since last seeing me she is seen nurse practitioner in September 2021.  At that time she was wheezing.  She was given Depo-Medrol and outpatient Omnicef.  She says within a few days she responded to this.  Since then she feels back to baseline but she says  even at baseline she is short of breath with exertion relieved by rest.  She says is not normal for her.  She recently had a cardiac cath and she reports this is normal.  I reviewed the chart and agree with that.  She is also dealing with her husband has esophageal cancer.  Recently was in the ER because of' false Hyperkalemia according to history.  She is currently on TNF alpha blockade and methotrexate for her rheumatoid arthritis.  She says the joint symptoms have not fully improved.  She is waiting for the third  dose of the TNF alpha blockade before she is expecting to show improvement in her symptoms.  Overall the last few months have been high level of social stress.  She thinks it is slowly settling down.  She feels like things are changed after getting her Covid vaccine.  Nevertheless she understands she is immunosuppressed and she is in need of a booster.  She has some trepidation about getting the booster.  She is willing to have a IgG antibody levels checked as a means of triage her understanding antibody response to previous Covid vaccine.  Today she pulmonary function test in my view shows obstruction with significant bronchodilator response consistent with an asthma pattern.  Documented below.  I reviewed the graph.  Nitric oxide test and asthma control test scores are listed below and shows disease activity   Asthma Control Test ACT Total Score  01/23/2020 18  08/29/2019 24     Lab Results  Component Value Date   NITRICOXIDE 34 01/23/2020      OV 03/04/2021  Subjective:  Patient ID: Brandy Townsend, female , DOB: [birth year] , age 49 y.o. , MRN: [id number] , ADDRESS: [place] [id number] PCP Reynold Bowen, MD Patient Care Team: Reynold Bowen, MD as PCP - General (Endocrinology) Vania Rea, MD as PCP - OBGYN (Obstetrics and Gynecology) Josue Hector, MD as PCP - Cardiology (Cardiology)  This Provider for this visit: Treatment Team:  Attending Provider: Brand Males, MD    03/04/2021 -   Chief Complaint  Patient presents with   Follow-up    No new concerns     HPI Brandy Townsend 72 y.o. -returns for follow-up.  She tells me overall her asthma is doing well.  In September/October 2022 Dr. Arville Care added Singulair.  She was referred to allergist.  Work-up there has been reassuring.  She would not plan to go back to allergy clinic again.  She saw Dr. Donneta Romberg.  She feels the [facility] and Singulair have helped her.  She wants a refill on her albuterol.   Her ACT control score shows improvement.  However her exhaled nitric oxide score is 55 and is borderline high.  However she is not feeling it.  Of note she had a fourth COVID mRNA booster vaccine against delta and oh micron.  However 2 days after that she got significant side effects of joint flare and like fatigue.  She does not want to do the mRNA vaccine again.  This is second time she has had significant side effects.  She needed prednisone this time.  After the second shot she ended up in the ER with tachycardia.  I have advised her that mRNA vaccine should be listed as allergy.  She is agreeable.  Asthma Control Test ACT Total Score  03/04/2021 24  09/15/2020 18  03/11/2020 24     Lab Results  Component Value Date  NITRICOXIDE 34 01/23/2020   .      OV 06/01/2021  Subjective:  Patient ID: Brandy Townsend, female , DOB: Mar 14, 1951 , age 21 y.o. , MRN: [id number] , ADDRESS: [place] [id number] PCP Reynold Bowen, MD Patient Care Team: Reynold Bowen, MD as PCP - General (Endocrinology) Vania Rea, MD as PCP - OBGYN (Obstetrics and Gynecology) Josue Hector, MD as PCP - Cardiology (Cardiology)  This Provider for this visit: Treatment Team:  Attending Provider: Brand Males, MD  Rheumatoid arthritis with immunosuppression and mild pulmonary infiltrates.  BAL with neutrophilia blood culture negative-June 2021  Long history of allergic asthma previously on Dulera.  06/01/2021 -   Chief Complaint  Patient presents with   Acute Visit    Pt has had complaints of cough and chest tightness which began overnight 3/6.    Rheumatoid arthritis with immunosuppression and mild pulmonary infiltrates.  BAL with neutrophilia blood culture negative-June 2021  Long history of allergic asthma HPI Brandy Townsend 72 y.o. -this is an acute visit.  She tells me that a few weeks ago she had cough and it went away.  Then she went to [place].  She has returned and  has been doing well.  No sick contacts.  Then all of a sudden she woke up in the middle of the night with chest pain.  It was as though there was some sharp sensation in the sternal area.  She reports history of normal stress test within a year.  She did have normal troponins in 2022 and 2021.  Review of the records indicate low risk cardiac stress test February 2021.  Cardiac cath in October 2021 showed nonobstructive coronary artery disease with moderate disease in the first OM.  But in any event later she started having significant cough with green sputum.  She is also short of breath and fatigue.  The chest pain was associated with the cough.     12/02/2021 Follow up : Asthma  Patient presents for a 86-monthfollow-up.  Patient has moderate persistent asthma with an allergic phenotype.  She says overall she is doing very well.  Asthma is been under good control.  She denies any flare of cough or wheezing.  ACT score today is 24.  Exhaled nitric oxide testing is at 62 ppb.  She remains on Breo inhaler daily.  She takes Zyrtec and Singulair daily. No increased albuterol use .   She does have rheumatoid arthritis followed by rheumatology on Orencia. Did have flare this summer treated with steroids , none since.   Very active.  Caregiver for her husband. Husband has had esophageal cancer, has feeding tube. Goes in and out of hospital.  Has went back to work partime , 1 day a week. At [employer] .  This is known to cause asthma exacerbation.  Her maintenance medication for asthma includes Breo and Singulair  She continues to deal with easy bruising on her forearms and arms that are pressure related.  She says it is idiopathic.  She believes her platelets are normal.   OV 06/07/2022  Subjective:  Patient ID: JMyra Townsend female , DOB: 3July 12, 1952, age 72y.o. , MRN: [id number], [address]: [id number] SReynold Bowen MD Patient Care Team: SReynold Bowen  MD as PCP - General (Endocrinology) WVania Rea MD as PCP - OBGYN (Obstetrics and Gynecology) NJosue Hector MD as PCP - Cardiology (Cardiology)  This  Provider for this visit: Treatment Team:  Attending Provider: Brand Males, MD    06/07/2022 -   Chief Complaint  Patient presents with   Follow-up    Asthma well controlled.  Needs refill on Breo inhaler.  Has not used Breo in about a month.     HPI Brandy Townsend 72 y.o. -returns for follow-up.  She last saw me personally in May 2023.  Then in September [address] [place].  At the time the exam nitric oxide was slightly high.  Then after Christmas 2023 she had influenza and got another round of prednisone.  Currently she is doing well.  She ran out of Breo a month ago and she still doing well ACT scores are at baseline.  She is on Singulair but she is out of [place].  She is wondering whether she should restart the [facility] or not. FENO IS HIGH52ppb  Social - Husband who is esophageal cancer and has a feeding tube is now getting at least 350 cal orally.  He is eating peaches and cheese crackers. - Daughter is in [place] -currently she is safe  Asthma Control Test ACT Total Score  06/07/2022  2:58 PM 23  12/02/2021  8:56 AM 24  03/04/2021  1:30 PM 24   Lab Results  Component Value Date   NITRICOXIDE 34 01/23/2020      PFT     Latest Ref Rng & Units 01/23/2020    8:43 AM 01/13/2017    8:40 AM  PFT Results  FVC-Pre L 2.08  2.43   FVC-Predicted Pre % 69  79   FVC-Post L 2.41  2.46   FVC-Predicted Post % 80  80   Pre FEV1/FVC % % 72  73   Post FEV1/FCV % % 75  76   FEV1-Pre L 1.51  1.77   FEV1-Predicted Pre % 66  75   FEV1-Post L 1.81  1.87   DLCO uncorrected ml/min/mmHg 18.49  16.68   DLCO UNC% % 97  72   DLCO corrected ml/min/mmHg 18.38  17.68   DLCO COR %Predicted % 96  77   DLVA Predicted % 108  83   TLC L 5.38  5.90   TLC % Predicted % 109  120   RV % Predicted % 142  162        has a past medical  history of Asthma, CAD (coronary artery disease), Diverticulosis, Graves disease, High cholesterol, Hypertension, Hypothyroidism, Nephrolithiasis, Osteoporosis, Rheumatoid arthritis ([place]), and Vertigo.   reports that she quit smoking about 13 years ago. Her smoking use included cigarettes. She has a 20.00 pack-year smoking history. She has never used smokeless tobacco.  Past Surgical History:  Procedure Laterality Date   BRONCHIAL WASHINGS  09/12/2019   Procedure: BRONCHIAL WASHINGS;  Surgeon: Brand Males, MD;  Location: [facility];  Service: Endoscopy;;   INTRAVASCULAR PRESSURE WIRE/FFR STUDY N/A 01/08/2020   Procedure: INTRAVASCULAR PRESSURE WIRE/FFR STUDY;  Surgeon: Martinique, Peter M, MD;  Location: [facility];  Service: Cardiovascular;  Laterality: N/A;   LEFT HEART CATH AND CORONARY ANGIOGRAPHY N/A 01/08/2020   Procedure: LEFT HEART CATH AND CORONARY ANGIOGRAPHY;  Surgeon: Martinique, Peter M, MD;  Location: [facility];  Service: Cardiovascular;  Laterality: N/A;   VIDEO BRONCHOSCOPY N/A 09/12/2019   Procedure: VIDEO BRONCHOSCOPY WITHOUT FLUORO;  Surgeon: Brand Males, MD;  Location: [facility];  Service: Endoscopy;  Laterality: N/A;    Allergies  Allergen Reactions   Cat Hair Extract  Other reaction(s): congestion   Dust Mite Extract     Other reaction(s): Unknown   Morphine And Related Nausea And Vomiting   Macrobid [Nitrofurantoin Monohyd Macro] Other (See Comments)    Lowered potassium, caused aches and pains    Immunization History  Administered Date(s) Administered   Fluad Quad(high Dose 65+) 01/20/2020, 12/31/2021   Influenza Whole 01/04/2017   Influenza, High Dose Seasonal PF 01/03/2017, 12/27/2018, 03/03/2021   Influenza, Quadrivalent, Recombinant, Inj, Pf 01/09/2018, 01/11/2019   Influenza,inj,quad, With Preservative 12/26/2016   Influenza-Unspecified 12/26/2013, 12/27/2014, 12/27/2015   PFIZER(Purple Top)SARS-COV-2 Vaccination 04/17/2019,  05/06/2019, 01/31/2020   Pfizer Covid-19 Vaccine Bivalent Booster 9yr & up 01/22/2021   Pneumococcal Conjugate-13 04/28/2014, 05/26/2014   Pneumococcal Polysaccharide-23 03/03/2021   Respiratory Syncytial Virus Vaccine,Recomb Aduvanted(Arexvy) 03/10/2022   Zoster Recombinat (Shingrix) 01/14/2019, 04/02/2019   Zoster, Live 05/22/2013, 04/01/2019    Family History  Problem Relation Age of Onset   Breast cancer Mother    Heart disease Father    Cancer Daughter 348      stage 3 breast      Current Outpatient Medications:    abatacept (ORENCIA) 250 MG injection, Inject 250 mg into the vein See admin instructions. Inject 250 mg IV once every month., Disp: , Rfl:    albuterol (VENTOLIN HFA) 108 (90 Base) MCG/ACT inhaler, Inhale 1-2 puffs into the lungs every 6 (six) hours as needed for wheezing or shortness of breath., Disp: 8 g, Rfl: 5   aspirin EC 81 MG tablet, Take 1 tablet (81 mg total) by mouth daily. Swallow whole., Disp: 90 tablet, Rfl: 3   BREO ELLIPTA 100-25 MCG/INH AEPB, TAKE 1 PUFF BY MOUTH EVERY DAY, Disp: 60 each, Rfl: 5   cetirizine (ZYRTEC) 10 MG tablet, Take 10 mg by mouth at bedtime as needed for allergies. , Disp: , Rfl:    folic acid (FOLVITE) 1 MG tablet, Take 1 mg by mouth daily., Disp: , Rfl:    KLOR-CON 10 10 MEQ tablet, Take 10 mEq by mouth daily., Disp: , Rfl: 0   levothyroxine (SYNTHROID) 50 MCG tablet, Take 50 mcg by mouth daily. , Disp: , Rfl:    methotrexate (RHEUMATREX) 2.5 MG tablet, Take 10 mg by mouth once a week. Caution:Chemotherapy. Protect from light., Disp: , Rfl:    montelukast (SINGULAIR) 10 MG tablet, Take 10 mg by mouth daily., Disp: , Rfl:    nitroGLYCERIN (NITROSTAT) 0.4 MG SL tablet, Place 1 tablet (0.4 mg total) under the tongue every 5 (five) minutes as needed for chest pain., Disp: 25 tablet, Rfl: 3   Probiotic Product (PROBIOTIC GUMMIES PO), Take by mouth., Disp: , Rfl:    rosuvastatin (CRESTOR) 10 MG tablet, TAKE 1 TABLET BY MOUTH EVERY DAY,  Disp: 90 tablet, Rfl: 2   torsemide (DEMADEX) 20 MG tablet, Take 1 tablet (20 mg total) by mouth daily., Disp: 90 tablet, Rfl: 3   verapamil (CALAN-SR) 240 MG CR tablet, TAKE 1 TABLET BY MOUTH EVERY DAY, Disp: 90 tablet, Rfl: 2   Vitamin D, Ergocalciferol, (DRISDOL) 1.25 MG (50000 UNIT) CAPS capsule, Take 50,000 Units by mouth every 7 (seven) days. Monday, Disp: , Rfl:    benzonatate (TESSALON) 100 MG capsule, Take 1 capsule (100 mg total) by mouth 3 (three) times daily as needed for cough. (Patient not taking: Reported on 06/07/2022), Disp: 21 capsule, Rfl: 0   oseltamivir (TAMIFLU) 75 MG capsule, Take 1 capsule (75 mg total) by mouth every 12 (twelve) hours. (Patient not taking: Reported on  06/07/2022), Disp: 10 capsule, Rfl: 0      Objective:   Vitals:   06/07/22 1454  BP: (!) 98/58  Pulse: 73  Temp: 98.2 F (36.8 C)  TempSrc: Oral  SpO2: 97%  Weight: 149 lb (67.6 kg)  Height: '5\' 3"'$  (1.6 m)    Estimated body mass index is 26.39 kg/m as calculated from the following:   Height as of this encounter: '5\' 3"'$  (1.6 m).   Weight as of this encounter: 149 lb (67.6 kg).  '@WEIGHTCHANGE'$ @  [employer]   06/07/22 1454  Weight: 149 lb (67.6 kg)     Physical Exam    General: No distress. Looks well Neuro: Alert and Oriented x 3. GCS 15. Speech normal Psych: Pleasant Resp:  Barrel Chest - no.  Wheeze - no, Crackles - no, No overt respiratory distress CVS: Normal heart sounds. Murmurs - no Ext: Stigmata of Connective Tissue Disease - no HEENT: Normal upper airway. PEERL +. No post nasal drip        Assessment:       ICD-10-CM   1. Moderate persistent asthma without complication  123456 POCT EXHALED NITRIC OXIDE         Plan:     Patient Instructions     ICD-10-CM   1. Moderate persistent asthma without complication  123456        Out of breo x 1 month but on singulair Stable disease. No flare up However, Feno is elevated at 52ppb  Plan  - Take script for  AIR-SUPRA 2 puff as needed - If too expensive restart breo  - take script for both - Use albuterol as needed   Follow-up - Return in 6 months or sooner if needed  -check ACT score and also FeNO at follow-up    SIGNATURE    Dr. Brand Males, M.D., F.C.C.P,  Pulmonary and Critical Care Medicine Staff Physician, [place] Director - Interstitial Lung Disease  Program  Pulmonary [place] [facility] [place], [place], [address]  Pager: [phone], If no answer or between  15:00h - 7:00h: call [phone] Telephone: [phone]  3:37 PM 06/07/2022

## 2022-06-14 DIAGNOSIS — M0579 Rheumatoid arthritis with rheumatoid factor of multiple sites without organ or systems involvement: Secondary | ICD-10-CM | POA: Diagnosis not present

## 2022-06-21 NOTE — Progress Notes (Signed)
CARDIOLOGY OFFICE NOTE  Date:  06/30/2022    Brandy Townsend Date of Birth: 1950/09/12 Medical Record [id number]  PCP:  Reynold Bowen, MD  Cardiologist: Johnsie Cancel    History of Present Illness: 72 y.o.  history of palpitations, atypical chest pain with prior normal Myoview, asthma, RA, hx thyroid ablation, remote tobacco use, and +vaccines for Covid.   Recurrent chest pain  Cardiac CT 01/06/20 calcium score 790 97 th percentile CAD RADS 3 possibly obstructive RCA/OM1/D1 FFR CT positive RCA/D1 Cath 01/08/20 [place] Ramus 70% OM` 60% RCA 55% Medical Rx Did not tolerate imdur with headache    Quit smoking 2012 gained a lot of weight Sees [name] for asthma On Dulera and albuterol as needed   Seen in ED 05/14/20 with weakness and vertigo and vomiting after eating some rare hamburger Hydrated and d/c with meclizine and Phenergan   Her husband is recovering from esophageal cancer  Has two grand children in [place]  9/16  that see her over summer and two here   BP drops with RA infusion  Verapamil cut back to 120 mg daily 05/11/20   COVID 07/14/20 CXR NAD had 3 vaccines and got antibody titers in June Suspect they were checked due to immunosuppression with Rheumatrex   No angina despite trials and tribulations with husband  Monitor 08/26/21 with some self limited triggered atrial tachycardia Seen by Dr Lovena Le 09/15/21 and he only recommended watchful waiting no AAT or ablation TTE 08/09/21 EF 55-60% no significant valve dx  Tried to vacation at beach last summer but husbands FT fell out and it was a bad experience trying to get it replaced at [facility] Eventually came back to [facility] and had IR do it after he lost 9 lbs   Seen in ED for cough and malaise 03/19/22 and was Influenza A positive by PCR  Husband stable actually taking some PO. Grand kids safe but are only 12 miles from [place] and cannot come to visit this summer    Past Medical History:  Diagnosis Date   Asthma    CAD  (coronary artery disease)    Diverticulosis    Graves disease    s/p RAI   High cholesterol    Hypertension    Hypothyroidism    Nephrolithiasis    Osteoporosis    Rheumatoid arthritis    Vertigo     Past Surgical History:  Procedure Laterality Date   BRONCHIAL WASHINGS  09/12/2019   Procedure: BRONCHIAL WASHINGS;  Surgeon: Brand Males, MD;  Location: [facility];  Service: Endoscopy;;   CORONARY PRESSURE/FFR STUDY N/A 01/08/2020   Procedure: INTRAVASCULAR PRESSURE WIRE/FFR STUDY;  Surgeon: Martinique, Corinne Goucher M, MD;  Location: [place] [facility];  Service: Cardiovascular;  Laterality: N/A;   LEFT HEART CATH AND CORONARY ANGIOGRAPHY N/A 01/08/2020   Procedure: LEFT HEART CATH AND CORONARY ANGIOGRAPHY;  Surgeon: Martinique, Lexie Koehl M, MD;  Location: [place] [facility];  Service: Cardiovascular;  Laterality: N/A;   VIDEO BRONCHOSCOPY N/A 09/12/2019   Procedure: VIDEO BRONCHOSCOPY WITHOUT FLUORO;  Surgeon: Brand Males, MD;  Location: [facility];  Service: Endoscopy;  Laterality: N/A;     Medications: Current Meds  Medication Sig   abatacept (ORENCIA) 250 MG injection Inject 250 mg into the vein See admin instructions. Inject 250 mg IV once every month.   albuterol (VENTOLIN HFA) 108 (90 Base) MCG/ACT inhaler Inhale 1-2 puffs into the lungs every 6 (six) hours as needed for wheezing or shortness of breath.  aspirin EC 81 MG tablet Take 1 tablet (81 mg total) by mouth daily. Swallow whole.   cetirizine (ZYRTEC) 10 MG tablet Take 10 mg by mouth at bedtime as needed for allergies.    fluticasone furoate-vilanterol (BREO ELLIPTA) 100-25 MCG/ACT AEPB TAKE 1 PUFF BY MOUTH EVERY DAY   folic acid (FOLVITE) 1 MG tablet Take 1 mg by mouth daily.   KLOR-CON 10 10 MEQ tablet Take 10 mEq by mouth daily.   levothyroxine (SYNTHROID) 50 MCG tablet Take 50 mcg by mouth daily.    methotrexate (RHEUMATREX) 2.5 MG tablet Take 10 mg by mouth once a week. Caution:Chemotherapy. Protect from light.    montelukast (SINGULAIR) 10 MG tablet Take 10 mg by mouth daily.   nitroGLYCERIN (NITROSTAT) 0.4 MG SL tablet Place 1 tablet (0.4 mg total) under the tongue every 5 (five) minutes as needed for chest pain.   Probiotic Product (PROBIOTIC GUMMIES PO) Take by mouth.   rosuvastatin (CRESTOR) 10 MG tablet TAKE 1 TABLET BY MOUTH EVERY DAY   torsemide (DEMADEX) 20 MG tablet Take 1 tablet (20 mg total) by mouth daily.   verapamil (CALAN-SR) 240 MG CR tablet TAKE 1 TABLET BY MOUTH EVERY DAY   Vitamin D, Ergocalciferol, (DRISDOL) 1.25 MG (50000 UNIT) CAPS capsule Take 50,000 Units by mouth every 7 (seven) days. Monday     Allergies: Allergies  Allergen Reactions   Cat Hair Extract     Other reaction(s): congestion   Dust Mite Extract     Other reaction(s): Unknown   Morphine And Related Nausea And Vomiting   Macrobid [Nitrofurantoin Monohyd Macro] Other (See Comments)    Lowered potassium, caused aches and pains    Social History: The patient  reports that she quit smoking about 13 years ago. Her smoking use included cigarettes. She has a 20.00 pack-year smoking history. She has never used smokeless tobacco. She reports that she does not drink alcohol and does not use drugs.   Family History: The patient's family history includes Breast cancer in her mother; Cancer (age of onset: 75) in her daughter; Heart disease in her father. FH + for CAD.   Review of Systems: Please see the history of present illness.   All other systems are reviewed and negative.   Physical Exam: VS:  BP 122/64   Pulse 61   Ht 5\' 3"  (1.6 m)   Wt 152 lb (68.9 kg)   SpO2 98%   BMI 26.93 kg/m  .  BMI Body mass index is 26.93 kg/m.  Wt Readings from Last 3 Encounters:  06/30/22 152 lb (68.9 kg)  06/07/22 149 lb (67.6 kg)  03/19/22 148 lb (67.1 kg)   Affect appropriate Healthy:  appears stated age HEENT: normal Neck supple with no adenopathy JVP normal no bruits no thyromegaly Lungs clear with no wheezing and  good diaphragmatic motion Heart:  S1/S2 no murmur, no rub, gallop or click PMI normal Abdomen: benighn, BS positve, no tenderness, no AAA no bruit.  No HSM or HJR Distal pulses intact with no bruits No edema Neuro non-focal Skin warm and dry No muscular weakness .   LABORATORY DATA:  EKG:   2/17 SR ? Limb lead reversal normal ST segments   Lab Results  Component Value Date   WBC 6.6 03/19/2022   HGB 12.7 03/19/2022   HCT 40.3 03/19/2022   PLT 254 03/19/2022   GLUCOSE 129 (H) 03/19/2022   CHOL 145 03/16/2020   TRIG 66 03/16/2020   HDL 84 03/16/2020  LDLCALC 48 03/16/2020   ALT 23 07/24/2021   AST 17 07/24/2021   NA 141 03/19/2022   K 3.3 (L) 03/19/2022   CL 105 03/19/2022   CREATININE 0.72 03/19/2022   BUN 9 03/19/2022   CO2 27 03/19/2022   TSH 1.549 09/01/2020     BNP (last 3 results) No results for input(s): "BNP" in the last 8760 hours.  ProBNP (last 3 results) No results for input(s): "PROBNP" in the last 8760 hours.   Other Studies Reviewed Today:  CARDIAC CATHETERIZATION   Result Date: 01/08/2020  Mid LAD lesion is 20% stenosed.  1st Diag lesion is 40% stenosed.  Ramus lesion is 70% stenosed.  1st Mrg lesion is 60% stenosed.  Prox RCA to Mid RCA lesion is 55% stenosed.  The left ventricular systolic function is normal.  LV end diastolic pressure is normal.  The left ventricular ejection fraction is 55-65% by visual estimate.  1. Nonobstructive CAD. There is modest disease in the mid RCA with normal DFR. Modest disease in the first OM. The ramus branch is tiny. 2. Normal LV function 3. Normal LVEDP. Plan: recommend continued medical therapy. Patient is a candidate for DC today.   CORONARY CT IMPRESSION 12/2019: 1. Calcium score 790 which is 48 th percentile for age and sex and involves all 3 major epicardial vessels   2.  Normal aortic root 2.9 cm   3. CAD RADS 3 possible obstructive CAD tightest lesions appear to be in mid/distal RCA and OM1  Study sent for [facility] CT   Jenkins Rouge     Electronically Signed   By: Eligha Bridegroom  Myoview Study Highlights 04/2019   The left ventricular ejection fraction is hyperdynamic (>65%). Nuclear stress EF: 77%. Horizontal ST segment depression ST segment depression of 1 mm was noted during stress in the II, III and aVF leads, and returning to baseline after 5-9 minutes of recovery. The study is normal. This is a low risk study. No change from prior study.      ECHO IMPRESSIONS 04/2019   1. Left ventricular ejection fraction, by estimation, is 55 to 60%. The  left ventricle has normal function. The left ventricle has no regional  wall motion abnormalities. Left ventricular diastolic parameters were  normal.   2. Right ventricular systolic function is normal. The right ventricular  size is normal. There is normal pulmonary artery systolic pressure. The  estimated right ventricular systolic pressure is [id number] mmHg.   3. The mitral valve is grossly normal. No evidence of mitral valve  regurgitation.   4. The aortic valve is tricuspid. Aortic valve regurgitation is not  visualized. No aortic stenosis is present.   5. The inferior vena cava is normal in size with greater than 50%  respiratory variability, suggesting right atrial pressure of 3 mmHg.  '  ECG:  06/30/2022 NSR normal ECG      ASSESSMENT & PLAN:     1. CAD - Cath 01/08/20  following abnormal CT and in the setting of having chest pain/SOB - to manage medically. She is on statin Did not tolerate nitrates  Fatigue and significant asthma not on beta blocker continue ASA and verapamil   2. HTN - seems to be running lower decrease verapamil to 120 mg daily .   3. HLD - on statin tolerating 10 mg Crestor LDL 48   4. RA - followed by Rheumatology on orencia now Folic acid added to help with hair thinning   5. Pulmonary :  f/u [name] asthma, COPD Gold 2    6. Situational stress - not on Rx f/u primary   7. Thyroid:  On  synthroid TSH normal   8. COVID:  07/10/20 has had 3 vaccines good antibody titers 09/15/20 improved CXR with NAD Influenza A 02/2022 CXR NAD Rx with Tamiflu  9. Palpitations:  benign self limited atrial tachycardia on monitor Seen by EP Dr Lovena Le no ablation or AAT recommended Not on beta blocker with asthma and lung dx Continue verapamil  F/U in a year      Signed: Jenkins Rouge, MD  06/30/2022 8:25 AM  [facility] [address] [place], [place]  [address] Phone: [phone] Fax: [phone]

## 2022-06-30 ENCOUNTER — Ambulatory Visit: Payer: Medicare Other | Attending: Cardiovascular Disease | Admitting: Cardiovascular Disease

## 2022-06-30 ENCOUNTER — Encounter: Payer: Self-pay | Admitting: Cardiovascular Disease

## 2022-06-30 VITALS — BP 122/64 | HR 61 | Ht 63.0 in | Wt 152.0 lb

## 2022-06-30 DIAGNOSIS — M069 Rheumatoid arthritis, unspecified: Secondary | ICD-10-CM | POA: Diagnosis not present

## 2022-06-30 DIAGNOSIS — R002 Palpitations: Secondary | ICD-10-CM

## 2022-06-30 DIAGNOSIS — E782 Mixed hyperlipidemia: Secondary | ICD-10-CM

## 2022-06-30 DIAGNOSIS — I25119 Atherosclerotic heart disease of native coronary artery with unspecified angina pectoris: Secondary | ICD-10-CM

## 2022-06-30 NOTE — Patient Instructions (Signed)
Medication Instructions:  Your physician recommends that you continue on your current medications as directed. Please refer to the Current Medication list given to you today.  *If you need a refill on your cardiac medications before your next appointment, please call your pharmacy*  Lab Work: If you have labs (blood work) drawn today and your tests are completely normal, you will receive your results only by: MyChart Message (if you have MyChart) OR A paper copy in the mail If you have any lab test that is abnormal or we need to change your treatment, we will call you to review the results.  Testing/Procedures: None ordered today.  Follow-Up: At [facility], you and your health needs are our priority.  As part of our continuing mission to provide you with exceptional heart care, we have created designated Provider Care Teams.  These Care Teams include your primary Cardiologist (physician) and Advanced Practice Providers (APPs -  Physician Assistants and Nurse Practitioners) who all work together to provide you with the care you need, when you need it.  We recommend signing up for the patient portal called "MyChart".  Sign up information is provided on this After Visit Summary.  MyChart is used to connect with patients for Virtual Visits (Telemedicine).  Patients are able to view lab/test results, encounter notes, upcoming appointments, etc.  Non-urgent messages can be sent to your provider as well.   To learn more about what you can do with MyChart, go to [email or web address].    Your next appointment:   1 year(s)  Provider:   Peter Nishan, MD      

## 2022-07-13 ENCOUNTER — Other Ambulatory Visit (HOSPITAL_BASED_OUTPATIENT_CLINIC_OR_DEPARTMENT_OTHER): Payer: Self-pay

## 2022-07-13 DIAGNOSIS — M0579 Rheumatoid arthritis with rheumatoid factor of multiple sites without organ or systems involvement: Secondary | ICD-10-CM | POA: Diagnosis not present

## 2022-07-20 DIAGNOSIS — E663 Overweight: Secondary | ICD-10-CM | POA: Diagnosis not present

## 2022-07-20 DIAGNOSIS — M810 Age-related osteoporosis without current pathological fracture: Secondary | ICD-10-CM | POA: Diagnosis not present

## 2022-07-20 DIAGNOSIS — M0579 Rheumatoid arthritis with rheumatoid factor of multiple sites without organ or systems involvement: Secondary | ICD-10-CM | POA: Diagnosis not present

## 2022-07-20 DIAGNOSIS — R9389 Abnormal findings on diagnostic imaging of other specified body structures: Secondary | ICD-10-CM | POA: Diagnosis not present

## 2022-07-20 DIAGNOSIS — Z79899 Other long term (current) drug therapy: Secondary | ICD-10-CM | POA: Diagnosis not present

## 2022-07-20 DIAGNOSIS — S6991XA Unspecified injury of right wrist, hand and finger(s), initial encounter: Secondary | ICD-10-CM | POA: Diagnosis not present

## 2022-07-20 DIAGNOSIS — Z6825 Body mass index (BMI) 25.0-25.9, adult: Secondary | ICD-10-CM | POA: Diagnosis not present

## 2022-08-10 DIAGNOSIS — M0579 Rheumatoid arthritis with rheumatoid factor of multiple sites without organ or systems involvement: Secondary | ICD-10-CM | POA: Diagnosis not present

## 2022-08-17 DIAGNOSIS — M0579 Rheumatoid arthritis with rheumatoid factor of multiple sites without organ or systems involvement: Secondary | ICD-10-CM | POA: Diagnosis not present

## 2022-08-23 ENCOUNTER — Other Ambulatory Visit: Payer: Self-pay | Admitting: Internal Medicine

## 2022-08-23 MED ORDER — BUDESONIDE-FORMOTEROL FUMARATE 160-4.5 MCG/ACT IN AERO: 2.0000 | INHALATION_SPRAY | Freq: Two times a day (BID) | RESPIRATORY_TRACT | 12 refills | Status: DC

## 2022-08-23 NOTE — Telephone Encounter (Signed)
Spoke with patient regarding refill request .Partient stated wixela is going to cost patient $80 and she was told by the pharmacist a new inhaler is called Jerral Ralph and that will cost her $20.Patient would like to switch inhaler to [place].  Dr.Wert can you please advise.  Thank you

## 2022-08-30 DIAGNOSIS — Z124 Encounter for screening for malignant neoplasm of cervix: Secondary | ICD-10-CM | POA: Diagnosis not present

## 2022-08-30 DIAGNOSIS — Z6825 Body mass index (BMI) 25.0-25.9, adult: Secondary | ICD-10-CM | POA: Diagnosis not present

## 2022-08-30 DIAGNOSIS — Z1231 Encounter for screening mammogram for malignant neoplasm of breast: Secondary | ICD-10-CM | POA: Diagnosis not present

## 2022-09-05 ENCOUNTER — Other Ambulatory Visit (HOSPITAL_BASED_OUTPATIENT_CLINIC_OR_DEPARTMENT_OTHER): Payer: Self-pay

## 2022-09-08 DIAGNOSIS — M0579 Rheumatoid arthritis with rheumatoid factor of multiple sites without organ or systems involvement: Secondary | ICD-10-CM | POA: Diagnosis not present

## 2022-09-08 DIAGNOSIS — S6991XA Unspecified injury of right wrist, hand and finger(s), initial encounter: Secondary | ICD-10-CM | POA: Diagnosis not present

## 2022-09-08 DIAGNOSIS — Z79899 Other long term (current) drug therapy: Secondary | ICD-10-CM | POA: Diagnosis not present

## 2022-09-08 DIAGNOSIS — M1991 Primary osteoarthritis, unspecified site: Secondary | ICD-10-CM | POA: Diagnosis not present

## 2022-09-08 DIAGNOSIS — Z6826 Body mass index (BMI) 26.0-26.9, adult: Secondary | ICD-10-CM | POA: Diagnosis not present

## 2022-09-08 DIAGNOSIS — M810 Age-related osteoporosis without current pathological fracture: Secondary | ICD-10-CM | POA: Diagnosis not present

## 2022-09-08 DIAGNOSIS — R9389 Abnormal findings on diagnostic imaging of other specified body structures: Secondary | ICD-10-CM | POA: Diagnosis not present

## 2022-09-08 DIAGNOSIS — E663 Overweight: Secondary | ICD-10-CM | POA: Diagnosis not present

## 2022-09-08 DIAGNOSIS — M25531 Pain in right wrist: Secondary | ICD-10-CM | POA: Diagnosis not present

## 2022-09-09 DIAGNOSIS — M0579 Rheumatoid arthritis with rheumatoid factor of multiple sites without organ or systems involvement: Secondary | ICD-10-CM | POA: Diagnosis not present

## 2022-09-13 DIAGNOSIS — H7291 Unspecified perforation of tympanic membrane, right ear: Secondary | ICD-10-CM | POA: Diagnosis not present

## 2022-10-02 ENCOUNTER — Other Ambulatory Visit: Payer: Self-pay | Admitting: Cardiovascular Disease

## 2022-10-07 DIAGNOSIS — M0579 Rheumatoid arthritis with rheumatoid factor of multiple sites without organ or systems involvement: Secondary | ICD-10-CM | POA: Diagnosis not present

## 2022-10-25 DIAGNOSIS — H2513 Age-related nuclear cataract, bilateral: Secondary | ICD-10-CM | POA: Diagnosis not present

## 2022-11-04 DIAGNOSIS — M0579 Rheumatoid arthritis with rheumatoid factor of multiple sites without organ or systems involvement: Secondary | ICD-10-CM | POA: Diagnosis not present

## 2022-11-04 DIAGNOSIS — Z111 Encounter for screening for respiratory tuberculosis: Secondary | ICD-10-CM | POA: Diagnosis not present

## 2022-11-04 DIAGNOSIS — R5383 Other fatigue: Secondary | ICD-10-CM | POA: Diagnosis not present

## 2022-11-04 DIAGNOSIS — Z79899 Other long term (current) drug therapy: Secondary | ICD-10-CM | POA: Diagnosis not present

## 2022-11-12 ENCOUNTER — Encounter (HOSPITAL_BASED_OUTPATIENT_CLINIC_OR_DEPARTMENT_OTHER): Payer: Self-pay

## 2022-11-12 ENCOUNTER — Other Ambulatory Visit: Payer: Self-pay

## 2022-11-12 ENCOUNTER — Emergency Department (HOSPITAL_BASED_OUTPATIENT_CLINIC_OR_DEPARTMENT_OTHER)
Admission: EM | Admit: 2022-11-12 | Discharge: 2022-11-12 | Disposition: A | Discharge status: Home / Self Care | Payer: Medicare Other | Attending: Emergency Medicine | Admitting: Emergency Medicine

## 2022-11-12 ENCOUNTER — Emergency Department (HOSPITAL_BASED_OUTPATIENT_CLINIC_OR_DEPARTMENT_OTHER): Payer: Medicare Other

## 2022-11-12 DIAGNOSIS — M25531 Pain in right wrist: Secondary | ICD-10-CM | POA: Diagnosis not present

## 2022-11-12 DIAGNOSIS — Z7982 Long term (current) use of aspirin: Secondary | ICD-10-CM | POA: Insufficient documentation

## 2022-11-12 DIAGNOSIS — M06031 Rheumatoid arthritis without rheumatoid factor, right wrist: Secondary | ICD-10-CM | POA: Diagnosis not present

## 2022-11-12 DIAGNOSIS — M8580 Other specified disorders of bone density and structure, unspecified site: Secondary | ICD-10-CM | POA: Diagnosis not present

## 2022-11-12 MED ORDER — LIDOCAINE 5 % EX PTCH
1.0000 | MEDICATED_PATCH | CUTANEOUS | Status: DC
  Administered 2022-11-12: 1 via TRANSDERMAL
  Filled 2022-11-12: qty 1

## 2022-11-12 MED ORDER — KETOROLAC TROMETHAMINE 60 MG/2ML IM SOLN
30.0000 mg | Freq: Once | INTRAMUSCULAR | Status: AC
  Administered 2022-11-12: 30 mg via INTRAMUSCULAR
  Filled 2022-11-12: qty 2

## 2022-11-12 MED ORDER — DICLOFENAC SODIUM 1 % EX GEL: 4.0000 g | Freq: Four times a day (QID) | CUTANEOUS | 0 refills | Status: AC

## 2022-11-12 MED ORDER — DEXAMETHASONE SODIUM PHOSPHATE 4 MG/ML IJ SOLN
4.0000 mg | Freq: Once | INTRAMUSCULAR | Status: AC
  Administered 2022-11-12: 4 mg via INTRAMUSCULAR
  Filled 2022-11-12: qty 1

## 2022-11-12 NOTE — ED Triage Notes (Signed)
POV from home, A&O x 4, GCS 15, amb to room  Pt c/o right wrist pain, hx of RA. Feels the same and denies trauma.

## 2022-11-12 NOTE — ED Provider Notes (Addendum)
[place] EMERGENCY DEPARTMENT AT [facility] Provider Note   CSN: [id number] Arrival date & time: 11/12/22  0542     History  Chief Complaint  Patient presents with   Wrist Pain    Brandy Townsend is a 72 y.o. female.  The history is provided by the patient.  Wrist Pain This is a recurrent problem. The current episode started 12 to 24 hours ago. The problem occurs constantly. Pertinent negatives include no chest pain, no abdominal pain, no headaches and no shortness of breath. Nothing aggravates the symptoms. Nothing relieves the symptoms. The treatment provided no relief.  Patient with RA presents with right wrist pain without trauma.  Patient states she has had toradol and depomedrol for this in the past.       Home Medications Prior to Admission medications   Medication Sig Start Date End Date Taking? Authorizing Provider  abatacept (ORENCIA) 250 MG injection Inject 250 mg into the vein See admin instructions. Inject 250 mg IV once every month.    [provider]  albuterol (VENTOLIN HFA) 108 (90 Base) MCG/ACT inhaler Inhale 1-2 puffs into the lungs every 6 (six) hours as needed for wheezing or shortness of breath. 12/02/21   Parrett, Virgel Bouquet, NP  Albuterol-Budesonide (AIRSUPRA) 90-80 MCG/ACT AERO Inhale 2 puffs into the lungs every 6 (six) hours as needed (for cough or wheeze.). Patient not taking: Reported on 06/30/2022 06/07/22   Kalman Shan, MD  aspirin EC 81 MG tablet Take 1 tablet (81 mg total) by mouth daily. Swallow whole. 12/17/19   Rosalio Macadamia, NP  benzonatate (TESSALON) 100 MG capsule Take 1 capsule (100 mg total) by mouth 3 (three) times daily as needed for cough. Patient not taking: Reported on 06/07/2022 03/19/22   Mardene Sayer, MD  budesonide-formoterol [facility]) 160-4.5 MCG/ACT inhaler Inhale 2 puffs into the lungs in the morning and at bedtime. 08/23/22   Nyoka Cowden, MD  cetirizine (ZYRTEC) 10 MG tablet Take 10 mg by mouth at  bedtime as needed for allergies.     [provider]  fluticasone furoate-vilanterol (BREO ELLIPTA) 100-25 MCG/ACT AEPB TAKE 1 PUFF BY MOUTH EVERY DAY 06/07/22   Kalman Shan, MD  folic acid (FOLVITE) 1 MG tablet Take 1 mg by mouth daily.    [provider]  KLOR-CON 10 10 MEQ tablet Take 10 mEq by mouth daily. 08/11/14   [provider]  levothyroxine (SYNTHROID) 50 MCG tablet Take 50 mcg by mouth daily.  08/16/18   [provider]  methotrexate (RHEUMATREX) 2.5 MG tablet Take 10 mg by mouth once a week. Caution:Chemotherapy. Protect from light.    [provider]  montelukast (SINGULAIR) 10 MG tablet Take 10 mg by mouth daily. 11/17/20   [provider]  nitroGLYCERIN (NITROSTAT) 0.4 MG SL tablet Place 1 tablet (0.4 mg total) under the tongue every 5 (five) minutes as needed for chest pain. 05/27/20   Wendall Stade, MD  oseltamivir (TAMIFLU) 75 MG capsule Take 1 capsule (75 mg total) by mouth every 12 (twelve) hours. Patient not taking: Reported on 06/07/2022 03/19/22   Mardene Sayer, MD  Probiotic Product (PROBIOTIC GUMMIES PO) Take by mouth.    [provider]  rosuvastatin (CRESTOR) 10 MG tablet TAKE 1 TABLET BY MOUTH EVERY DAY 10/04/22   Wendall Stade, MD  torsemide (DEMADEX) 20 MG tablet Take 1 tablet (20 mg total) by mouth daily. 05/15/19   Rosalio Macadamia, NP  verapamil (CALAN-SR)  240 MG CR tablet TAKE 1 TABLET BY MOUTH EVERY DAY 04/08/22   Wendall Stade, MD  Vitamin D, Ergocalciferol, (DRISDOL) 1.25 MG (50000 UNIT) CAPS capsule Take 50,000 Units by mouth every 7 (seven) days. Monday    [provider]      Allergies    Cat hair extract, Dust mite extract, Morphine and codeine, and Macrobid [nitrofurantoin monohyd macro]    Review of Systems   Review of Systems  Respiratory:  Negative for shortness of breath.   Cardiovascular:  Negative for chest pain.  Gastrointestinal:  Negative for abdominal pain.   Neurological:  Negative for headaches.    Physical Exam Updated Vital Signs BP 129/76   Pulse 65   Temp 98.8 F (37.1 C) (Oral)   Resp 18   Ht 5\' 3"  (1.6 m)   Wt 68.7 kg   SpO2 96%   BMI 26.82 kg/m  Physical Exam  ED Results / Procedures / Treatments   Labs (all labs ordered are listed, but only abnormal results are displayed) Labs Reviewed - No data to display  EKG None  Radiology No results found.  Procedures Procedures    Medications Ordered in ED Medications  ketorolac (TORADOL) injection 30 mg (has no administration in time range)  lidocaine (LIDODERM) 5 % 1 patch (1 patch Transdermal Patch Applied 11/12/22 0551)    ED Course/ Medical Decision Making/ A&P                                 Medical Decision Making RA pain in the right wrist   Amount and/or Complexity of Data Reviewed External Data Reviewed: notes.    Details: Previous notes reviewed  Radiology: ordered and independent interpretation performed.    Details: No fractures by me  Discussion of management or test interpretation with external provider(s): Spoke with Jimmie in pharmacy we do not have depomedrol in [facility]   Risk Prescription drug management.   Final Clinical Impression(s) / ED Diagnoses Final diagnoses:  Right wrist pain   Return for intractable cough, coughing up blood, fevers > 100.4 unrelieved by medication, shortness of breath, intractable vomiting, chest pain, shortness of breath, weakness, numbness, changes in speech, facial asymmetry, abdominal pain, passing out, Inability to tolerate liquids or food, cough, altered mental status or any concerns. No signs of systemic illness or infection. The patient is nontoxic-appearing on exam and vital signs are within normal limits.  I have reviewed the triage vital signs and the nursing notes. Pertinent labs & imaging results that were available during my care of the patient were reviewed by me and considered in my medical decision  making (see chart for details). After history, exam, and medical workup I feel the patient has been appropriately medically screened and is safe for discharge home. Pertinent diagnoses were discussed with the patient. Patient was given return precautions.         Shareese Macha, MD 11/12/22 [phone]

## 2022-11-14 DIAGNOSIS — H90A31 Mixed conductive and sensorineural hearing loss, unilateral, right ear with restricted hearing on the contralateral side: Secondary | ICD-10-CM | POA: Diagnosis not present

## 2022-11-21 DIAGNOSIS — E785 Hyperlipidemia, unspecified: Secondary | ICD-10-CM | POA: Diagnosis not present

## 2022-11-21 DIAGNOSIS — E042 Nontoxic multinodular goiter: Secondary | ICD-10-CM | POA: Diagnosis not present

## 2022-11-21 DIAGNOSIS — I2510 Atherosclerotic heart disease of native coronary artery without angina pectoris: Secondary | ICD-10-CM | POA: Diagnosis not present

## 2022-11-21 DIAGNOSIS — D126 Benign neoplasm of colon, unspecified: Secondary | ICD-10-CM | POA: Diagnosis not present

## 2022-11-21 DIAGNOSIS — M060A Rheumatoid arthritis without rheumatoid factor, other specified site: Secondary | ICD-10-CM | POA: Diagnosis not present

## 2022-11-21 DIAGNOSIS — M810 Age-related osteoporosis without current pathological fracture: Secondary | ICD-10-CM | POA: Diagnosis not present

## 2022-11-21 DIAGNOSIS — I10 Essential (primary) hypertension: Secondary | ICD-10-CM | POA: Diagnosis not present

## 2022-11-21 DIAGNOSIS — I700 Atherosclerosis of aorta: Secondary | ICD-10-CM | POA: Diagnosis not present

## 2022-11-21 DIAGNOSIS — E890 Postprocedural hypothyroidism: Secondary | ICD-10-CM | POA: Diagnosis not present

## 2022-11-21 DIAGNOSIS — R002 Palpitations: Secondary | ICD-10-CM | POA: Diagnosis not present

## 2022-11-21 DIAGNOSIS — J4540 Moderate persistent asthma, uncomplicated: Secondary | ICD-10-CM | POA: Diagnosis not present

## 2022-11-22 DIAGNOSIS — M0579 Rheumatoid arthritis with rheumatoid factor of multiple sites without organ or systems involvement: Secondary | ICD-10-CM | POA: Diagnosis not present

## 2022-12-02 DIAGNOSIS — M0579 Rheumatoid arthritis with rheumatoid factor of multiple sites without organ or systems involvement: Secondary | ICD-10-CM | POA: Diagnosis not present

## 2022-12-18 ENCOUNTER — Emergency Department (HOSPITAL_COMMUNITY): Payer: Medicare Other

## 2022-12-18 ENCOUNTER — Other Ambulatory Visit: Payer: Self-pay

## 2022-12-18 ENCOUNTER — Emergency Department (HOSPITAL_COMMUNITY)
Admission: EM | Admit: 2022-12-18 | Discharge: 2022-12-18 | Disposition: A | Discharge status: Home / Self Care | Payer: Medicare Other | Attending: Emergency Medicine | Admitting: Emergency Medicine

## 2022-12-18 ENCOUNTER — Encounter (HOSPITAL_COMMUNITY): Payer: Self-pay

## 2022-12-18 DIAGNOSIS — Z7951 Long term (current) use of inhaled steroids: Secondary | ICD-10-CM | POA: Insufficient documentation

## 2022-12-18 DIAGNOSIS — R0789 Other chest pain: Secondary | ICD-10-CM | POA: Diagnosis not present

## 2022-12-18 DIAGNOSIS — R079 Chest pain, unspecified: Secondary | ICD-10-CM | POA: Diagnosis not present

## 2022-12-18 DIAGNOSIS — E041 Nontoxic single thyroid nodule: Secondary | ICD-10-CM | POA: Diagnosis not present

## 2022-12-18 DIAGNOSIS — J45909 Unspecified asthma, uncomplicated: Secondary | ICD-10-CM | POA: Diagnosis not present

## 2022-12-18 DIAGNOSIS — R918 Other nonspecific abnormal finding of lung field: Secondary | ICD-10-CM | POA: Diagnosis not present

## 2022-12-18 DIAGNOSIS — R209 Unspecified disturbances of skin sensation: Secondary | ICD-10-CM | POA: Insufficient documentation

## 2022-12-18 DIAGNOSIS — I2510 Atherosclerotic heart disease of native coronary artery without angina pectoris: Secondary | ICD-10-CM | POA: Insufficient documentation

## 2022-12-18 DIAGNOSIS — E039 Hypothyroidism, unspecified: Secondary | ICD-10-CM | POA: Diagnosis not present

## 2022-12-18 DIAGNOSIS — R519 Headache, unspecified: Secondary | ICD-10-CM | POA: Diagnosis not present

## 2022-12-18 DIAGNOSIS — I6523 Occlusion and stenosis of bilateral carotid arteries: Secondary | ICD-10-CM | POA: Diagnosis not present

## 2022-12-18 DIAGNOSIS — J40 Bronchitis, not specified as acute or chronic: Secondary | ICD-10-CM | POA: Diagnosis not present

## 2022-12-18 DIAGNOSIS — M542 Cervicalgia: Secondary | ICD-10-CM

## 2022-12-18 DIAGNOSIS — Z7982 Long term (current) use of aspirin: Secondary | ICD-10-CM | POA: Diagnosis not present

## 2022-12-18 DIAGNOSIS — H538 Other visual disturbances: Secondary | ICD-10-CM | POA: Diagnosis not present

## 2022-12-18 DIAGNOSIS — I10 Essential (primary) hypertension: Secondary | ICD-10-CM | POA: Diagnosis not present

## 2022-12-18 DIAGNOSIS — I6522 Occlusion and stenosis of left carotid artery: Secondary | ICD-10-CM | POA: Diagnosis not present

## 2022-12-18 DIAGNOSIS — R7989 Other specified abnormal findings of blood chemistry: Secondary | ICD-10-CM | POA: Insufficient documentation

## 2022-12-18 DIAGNOSIS — E049 Nontoxic goiter, unspecified: Secondary | ICD-10-CM | POA: Diagnosis not present

## 2022-12-18 DIAGNOSIS — I672 Cerebral atherosclerosis: Secondary | ICD-10-CM | POA: Diagnosis not present

## 2022-12-18 LAB — CBC
HCT: 41.1 % (ref 36.0–46.0)
Hemoglobin: 13.5 g/dL (ref 12.0–15.0)
MCH: 28.5 pg (ref 26.0–34.0)
MCHC: 32.8 g/dL (ref 30.0–36.0)
MCV: 86.9 fL (ref 80.0–100.0)
Platelets: 306 10*3/uL (ref 150–400)
RBC: 4.73 MIL/uL (ref 3.87–5.11)
RDW: 13.7 % (ref 11.5–15.5)
WBC: 10 10*3/uL (ref 4.0–10.5)
nRBC: 0 % (ref 0.0–0.2)

## 2022-12-18 LAB — COMPREHENSIVE METABOLIC PANEL
ALT: 16 U/L (ref 0–44)
AST: 15 U/L (ref 15–41)
Albumin: 3.6 g/dL (ref 3.5–5.0)
Alkaline Phosphatase: 90 U/L (ref 38–126)
Anion gap: 13 (ref 5–15)
BUN: 17 mg/dL (ref 8–23)
CO2: 25 mmol/L (ref 22–32)
Calcium: 9 mg/dL (ref 8.9–10.3)
Chloride: 102 mmol/L (ref 98–111)
Creatinine, Ser: 0.89 mg/dL (ref 0.44–1.00)
GFR, Estimated: >60 mL/min (ref >60)
Glucose, Bld: 109 mg/dL — ABNORMAL HIGH (ref 70–99)
Potassium: 3.2 mmol/L — ABNORMAL LOW (ref 3.5–5.1)
Sodium: 140 mmol/L (ref 135–145)
Total Bilirubin: 0.7 mg/dL (ref 0.3–1.2)
Total Protein: 6.8 g/dL (ref 6.5–8.1)

## 2022-12-18 LAB — TROPONIN I (HIGH SENSITIVITY)
Troponin I (High Sensitivity): <2 ng/L (ref <18)
Troponin I (High Sensitivity): <2 ng/L (ref <18)

## 2022-12-18 LAB — MAGNESIUM: Magnesium: 1.9 mg/dL (ref 1.7–2.4)

## 2022-12-18 LAB — D-DIMER, QUANTITATIVE: D-Dimer, Quant: 0.6 ug/mL-FEU — ABNORMAL HIGH (ref 0.00–0.50)

## 2022-12-18 MED ORDER — AZITHROMYCIN 250 MG PO TABS: 250.0000 mg | ORAL_TABLET | Freq: Every day | ORAL | 0 refills | Status: DC

## 2022-12-18 MED ORDER — OXYCODONE-ACETAMINOPHEN 5-325 MG PO TABS
1.0000 | ORAL_TABLET | Freq: Once | ORAL | Status: AC
  Administered 2022-12-18: 1 via ORAL
  Filled 2022-12-18: qty 1

## 2022-12-18 MED ORDER — PREDNISONE 20 MG PO TABS: 40.0000 mg | ORAL_TABLET | Freq: Every day | ORAL | 0 refills | Status: AC

## 2022-12-18 MED ORDER — IOHEXOL 350 MG/ML SOLN: 50.0000 mL | Freq: Once | INTRAVENOUS | Status: DC | PRN

## 2022-12-18 MED ORDER — ONDANSETRON HCL 4 MG/2ML IJ SOLN
4.0000 mg | Freq: Once | INTRAMUSCULAR | Status: AC
  Administered 2022-12-18: 4 mg via INTRAVENOUS
  Filled 2022-12-18: qty 2

## 2022-12-18 MED ORDER — SODIUM CHLORIDE 0.9 % IV BOLUS
1000.0000 mL | Freq: Once | INTRAVENOUS | Status: AC
  Administered 2022-12-18: 1000 mL via INTRAVENOUS

## 2022-12-18 MED ORDER — AZITHROMYCIN 250 MG PO TABS
500.0000 mg | ORAL_TABLET | Freq: Once | ORAL | Status: AC
  Administered 2022-12-18: 500 mg via ORAL
  Filled 2022-12-18: qty 2

## 2022-12-18 MED ORDER — POTASSIUM CHLORIDE CRYS ER 20 MEQ PO TBCR
40.0000 meq | EXTENDED_RELEASE_TABLET | Freq: Once | ORAL | Status: AC
  Administered 2022-12-18: 40 meq via ORAL
  Filled 2022-12-18: qty 2

## 2022-12-18 MED ORDER — IOHEXOL 350 MG/ML SOLN
100.0000 mL | Freq: Once | INTRAVENOUS | Status: AC | PRN
  Administered 2022-12-18: 50 mL via INTRAVENOUS
  Administered 2022-12-18: 100 mL via INTRAVENOUS

## 2022-12-18 MED ORDER — ONDANSETRON 4 MG PO TBDP: 4.0000 mg | ORAL_TABLET | Freq: Three times a day (TID) | ORAL | 0 refills | Status: DC | PRN

## 2022-12-18 NOTE — ED Provider Notes (Signed)
Physical Exam  BP 130/70   Pulse 63   Temp 97.8 F (36.6 C) (Oral)   Resp 18   Ht 5\' 3"  (1.6 m)   Wt 70.8 kg   SpO2 100%   BMI 27.63 kg/m   Physical Exam Vitals and nursing note reviewed.  Constitutional:      General: She is not in acute distress.    Appearance: Normal appearance. She is normal weight. She is not ill-appearing.  HENT:     Head: Normocephalic and atraumatic.  Pulmonary:     Effort: Pulmonary effort is normal. No respiratory distress.  Abdominal:     General: Abdomen is flat.  Musculoskeletal:        General: Normal range of motion.     Cervical back: Neck supple.  Skin:    General: Skin is warm and dry.  Neurological:     Mental Status: She is alert and oriented to person, place, and time.     Comments:   MENTAL STATUS: AAOx3   LANG/SPEECH: Fluent, intact naming, repetition & comprehension   CRANIAL NERVES:   II: Pupils equal and reactive   III, IV, VI: EOM intact, no gaze preference or deviation, no nystagmus   V: normal sensation of the face   VII: no facial asymmetry   VIII: normal hearing to speech   MOTOR: 5/5 in both upper and lower extremities   SENSORY: Normal to touch in all extremiteis   COORD: Normal finger to nose, heel to shin and shoulder shrug, no tremor, no dysmetria. No pronator drift   Psychiatric:        Mood and Affect: Mood normal.        Behavior: Behavior normal.     Procedures  Procedures  ED Course / MDM   Clinical Course as of 12/18/22 1828  Sun Dec 18, 2022  1506 Await imaging.  Unfortunately will need another contrast bolus for CTA of the head  [AS]    Clinical Course User Index [AS] Ransome Helwig, Edsel Petrin, PA-C   Medical Decision Making Amount and/or Complexity of Data Reviewed Labs: ordered. Radiology: ordered.  Risk Prescription drug management.   Assumed care at shift change from previous provider.  Please see previous note for full HPI.  In short, 72 year old female presenting for evaluation of chest  pain, mild shortness of breath and head numbness.  Plan at the time of shift change is pending CTA head and neck.  Chest x-ray negative, CT PE study negative.  EKG without ischemic changes.  Initial and delta troponin negative.  Neuroexam per previous provider is nonfocal.  1730-CT PE study showing evidence of bronchitis.  CTA head and neck negative.  Chest x-ray reassuring.  Overall suspect her chest pressure and subjective shortness of breath is secondary to her bronchitis.  Patient was feeling nauseous and slightly dizzy after repeated boluses of contrast.  This was treated with Zofran and fluids in the ED.  She was given her first dose of azithromycin here in the emergency department.  Discharged on azithromycin for bronchitis as well as a steroid burst.  She was encouraged to follow-up with her primary cardiologist and primary care provider within the next week for reevaluation of her symptoms.  Overall very low suspicion for ACS given her reassuring workup.  PE study negative for PE as well.  She was given return precautions.  Stable at discharge.  At this time there does not appear to be any evidence of an acute emergency medical condition and  the patient appears stable for discharge with appropriate outpatient follow up. Diagnosis was discussed with patient who verbalizes understanding of care plan and is agreeable to discharge. I have discussed return precautions with patient who verbalizes understanding. Patient encouraged to follow-up with their PCP within 1 week. All questions answered.  Patient's case discussed with Dr. Rush Landmark who agrees with plan to discharge with follow-up.   Note: Portions of this report may have been transcribed using voice recognition software. Every effort was made to ensure accuracy; however, inadvertent computerized transcription errors may still be present.   Michelle Piper, Cordelia Poche 12/18/22 1828    Tegeler, Canary Brim, MD 12/18/22 [phone]

## 2022-12-18 NOTE — Discharge Instructions (Addendum)
You have been seen today for your complaint of chest tightness, shortness of breath, neck pain. Your lab work was reassuring. Your imaging was reassuring and showed evidence of bronchitis. Your discharge medications include azithromycin. This is an antibiotic. You should take it as prescribed. You should take it for the entire duration of the prescription. This may cause an upset stomach. This is normal. You may take this with food. You may also eat yogurt to prevent diarrhea. Prednisone.  This is a steroid.  Take it as prescribed and for the entire duration of the prescription. Follow up with: Your PCP within the next week for reevaluation.  Also follow-up with your cardiologist Please seek immediate medical care if you develop any of the following symptoms: Cough up blood. Feel pain in your chest. Have severe shortness of breath. Faint or keep feeling like you are going to faint. Have a severe headache. Have a fever or chills that get worse. At this time there does not appear to be the presence of an emergent medical condition, however there is always the potential for conditions to change. Please read and follow the below instructions.  Do not take your medicine if  develop an itchy rash, swelling in your mouth or lips, or difficulty breathing; call 911 and seek immediate emergency medical attention if this occurs.  You may review your lab tests and imaging results in their entirety on your MyChart account.  Please discuss all results of fully with your primary care provider and other specialist at your follow-up visit.  Note: Portions of this text may have been transcribed using voice recognition software. Every effort was made to ensure accuracy; however, inadvertent computerized transcription errors may still be present.

## 2022-12-18 NOTE — ED Notes (Signed)
Patient transported to CT 

## 2022-12-18 NOTE — ED Provider Notes (Incomplete)
1:05 PM Assumed care of patient from off-going team. For more details, please see note from same day.  In brief, this is a   Plan/Dispo at time of sign-out & ED Course since sign-out:   BP 122/82   Pulse 70   Temp 98 F (36.7 C) (Oral)   Resp (!) 26   Ht 5\' 3"  (1.6 m)   Wt 70.8 kg   SpO2 100%   BMI 27.63 kg/m    ED Course:      Dispo:  ------------------------------- Vivi Barrack, MD Emergency Medicine  This note was created using dictation software, which may contain spelling or grammatical errors.

## 2022-12-18 NOTE — ED Provider Notes (Signed)
[place] EMERGENCY DEPARTMENT AT [facility] Provider Note   CSN: [id number] Arrival date & time: 12/18/22  1230     History  Chief Complaint  Patient presents with   Chest Pain    Brandy Townsend is a 72 y.o. female with a past medical history of asthma, CAD, hypertension, hypothyroidism, osteoporosis, rheumatoid arthritis presents today for evaluation of numbness and chest pain.  Patient reports that she started having numbness on the left side of her head yesterday.  States she could not feel her hand when she scratched her head.  This morning she developed left-sided chest pain around 9 AM with associated neck pain and headache.  States she also had intermittent blurred vision in both eyes.  She denies nausea, vomiting, extremity weakness/numbness, bowel change, urinary symptoms, rash.  She denies any recent fall or head trauma.  Patient is not on any blood thinner.  She denies any personal or family history of PE/DVT, no recent long travel, no recent surgery, no leg swelling.   Chest Pain   Past Medical History:  Diagnosis Date   Asthma    CAD (coronary artery disease)    Diverticulosis    Graves disease    s/p RAI   High cholesterol    Hypertension    Hypothyroidism    Nephrolithiasis    Osteoporosis    Rheumatoid arthritis (HCC)    Vertigo    Past Surgical History:  Procedure Laterality Date   BRONCHIAL WASHINGS  09/12/2019   Procedure: BRONCHIAL WASHINGS;  Surgeon: Kalman Shan, MD;  Location: [facility] ENDOSCOPY;  Service: Endoscopy;;   CORONARY PRESSURE/FFR STUDY N/A 01/08/2020   Procedure: INTRAVASCULAR PRESSURE WIRE/FFR STUDY;  Surgeon: Swaziland, Peter M, MD;  Location: [facility] INVASIVE CV LAB;  Service: Cardiovascular;  Laterality: N/A;   LEFT HEART CATH AND CORONARY ANGIOGRAPHY N/A 01/08/2020   Procedure: LEFT HEART CATH AND CORONARY ANGIOGRAPHY;  Surgeon: Swaziland, Peter M, MD;  Location: [facility] INVASIVE CV LAB;  Service: Cardiovascular;  Laterality: N/A;   VIDEO  BRONCHOSCOPY N/A 09/12/2019   Procedure: VIDEO BRONCHOSCOPY WITHOUT FLUORO;  Surgeon: Kalman Shan, MD;  Location: [facility] ENDOSCOPY;  Service: Endoscopy;  Laterality: N/A;     Home Medications Prior to Admission medications   Medication Sig Start Date End Date Taking? Authorizing Provider  abatacept (ORENCIA) 250 MG injection Inject 250 mg into the vein See admin instructions. Inject 250 mg IV once every month.    [provider]  albuterol (VENTOLIN HFA) 108 (90 Base) MCG/ACT inhaler Inhale 1-2 puffs into the lungs every 6 (six) hours as needed for wheezing or shortness of breath. 12/02/21   Parrett, Virgel Bouquet, NP  Albuterol-Budesonide (AIRSUPRA) 90-80 MCG/ACT AERO Inhale 2 puffs into the lungs every 6 (six) hours as needed (for cough or wheeze.). Patient not taking: Reported on 06/30/2022 06/07/22   Kalman Shan, MD  aspirin EC 81 MG tablet Take 1 tablet (81 mg total) by mouth daily. Swallow whole. 12/17/19   Rosalio Macadamia, NP  benzonatate (TESSALON) 100 MG capsule Take 1 capsule (100 mg total) by mouth 3 (three) times daily as needed for cough. Patient not taking: Reported on 06/07/2022 03/19/22   Mardene Sayer, MD  budesonide-formoterol [facility]) 160-4.5 MCG/ACT inhaler Inhale 2 puffs into the lungs in the morning and at bedtime. 08/23/22   Nyoka Cowden, MD  cetirizine (ZYRTEC) 10 MG tablet Take 10 mg by mouth at bedtime as needed for allergies.     [provider]  diclofenac  Sodium (VOLTAREN) 1 % GEL Apply 4 g topically 4 (four) times daily. 11/12/22   Palumbo, April, MD  fluticasone furoate-vilanterol (BREO ELLIPTA) 100-25 MCG/ACT AEPB TAKE 1 PUFF BY MOUTH EVERY DAY 06/07/22   Kalman Shan, MD  folic acid (FOLVITE) 1 MG tablet Take 1 mg by mouth daily.    [provider]  KLOR-CON 10 10 MEQ tablet Take 10 mEq by mouth daily. 08/11/14   [provider]  levothyroxine (SYNTHROID) 50 MCG tablet Take 50 mcg by mouth daily.  08/16/18   [provider]  methotrexate (RHEUMATREX) 2.5 MG tablet Take 10 mg by mouth once a week. Caution:Chemotherapy. Protect from light.    [provider]  montelukast (SINGULAIR) 10 MG tablet Take 10 mg by mouth daily. 11/17/20   [provider]  nitroGLYCERIN (NITROSTAT) 0.4 MG SL tablet Place 1 tablet (0.4 mg total) under the tongue every 5 (five) minutes as needed for chest pain. 05/27/20   Wendall Stade, MD  oseltamivir (TAMIFLU) 75 MG capsule Take 1 capsule (75 mg total) by mouth every 12 (twelve) hours. Patient not taking: Reported on 06/07/2022 03/19/22   Mardene Sayer, MD  Probiotic Product (PROBIOTIC GUMMIES PO) Take by mouth.    [provider]  rosuvastatin (CRESTOR) 10 MG tablet TAKE 1 TABLET BY MOUTH EVERY DAY 10/04/22   Wendall Stade, MD  torsemide (DEMADEX) 20 MG tablet Take 1 tablet (20 mg total) by mouth daily. 05/15/19   Rosalio Macadamia, NP  verapamil (CALAN-SR) 240 MG CR tablet TAKE 1 TABLET BY MOUTH EVERY DAY 04/08/22   Wendall Stade, MD  Vitamin D, Ergocalciferol, (DRISDOL) 1.25 MG (50000 UNIT) CAPS capsule Take 50,000 Units by mouth every 7 (seven) days. Monday    [provider]      Allergies    Cat hair extract, Dust mite extract, Morphine and codeine, and Macrobid [nitrofurantoin monohyd macro]    Review of Systems   Review of Systems  Cardiovascular:  Positive for chest pain.    Physical Exam Updated Vital Signs BP (!) 154/70 (BP Location: Right Arm)   Pulse 64   Temp 98 F (36.7 C) (Oral)   Resp (!) 22   Ht 5\' 3"  (1.6 m)   Wt 70.8 kg   SpO2 100%   BMI 27.63 kg/m  Physical Exam Vitals and nursing note reviewed.  Constitutional:      Appearance: Normal appearance.  HENT:     Head: Normocephalic and atraumatic.     Mouth/Throat:     Mouth: Mucous membranes are moist.  Eyes:     General: No scleral icterus. Cardiovascular:     Rate and Rhythm: Normal rate and regular rhythm.     Pulses: Normal pulses.     Heart  sounds: Normal heart sounds.  Pulmonary:     Effort: Pulmonary effort is normal.     Breath sounds: Normal breath sounds.  Abdominal:     General: Abdomen is flat.     Palpations: Abdomen is soft.     Tenderness: There is no abdominal tenderness.  Musculoskeletal:        General: No deformity.  Skin:    General: Skin is warm.     Findings: No rash.  Neurological:     General: No focal deficit present.     Mental Status: She is alert.     Comments: Cranial nerves II through XII intact. Intact sensation to light touch in all 4 extremities. 5/5  strength in all 4 extremities. Intact finger-to-nose and heel-to-shin of all 4 extremities. No visual field cuts. No neglect noted. No aphasia noted.  Psychiatric:        Mood and Affect: Mood normal.     ED Results / Procedures / Treatments   Labs (all labs ordered are listed, but only abnormal results are displayed) Labs Reviewed  COMPREHENSIVE METABOLIC PANEL - Abnormal; Notable for the following components:      Result Value   Potassium 3.2 (*)    Glucose, Bld 109 (*)    All other components within normal limits  D-DIMER, QUANTITATIVE - Abnormal; Notable for the following components:   D-Dimer, Quant 0.60 (*)    All other components within normal limits  CBC  MAGNESIUM  TROPONIN I (HIGH SENSITIVITY)  TROPONIN I (HIGH SENSITIVITY)    EKG None  Radiology DG Chest 2 View  Result Date: 12/18/2022 CLINICAL DATA:  Chest pain EXAM: CHEST - 2 VIEW COMPARISON:  03/19/2022 FINDINGS: The heart size and mediastinal contours are within normal limits. No focal airspace consolidation, pleural effusion, or pneumothorax. The visualized skeletal structures are unremarkable. IMPRESSION: No active cardiopulmonary disease. Electronically Signed   By: Duanne Guess D.O.   On: 12/18/2022 13:39    Procedures Procedures    Medications Ordered in ED Medications  potassium chloride SA (KLOR-CON M) CR tablet 40 mEq (has no administration in time  range)  oxyCODONE-acetaminophen (PERCOCET/ROXICET) 5-325 MG per tablet 1 tablet (has no administration in time range)    ED Course/ Medical Decision Making/ A&P Clinical Course as of 12/18/22 1515  Sun Dec 18, 2022  1506 Await imaging.  Unfortunately will need another contrast bolus for CTA of the head  [AS]    Clinical Course User Index [AS] Schutt, Edsel Petrin, PA-C                                 Medical Decision Making Amount and/or Complexity of Data Reviewed Labs: ordered. Radiology: ordered.  Risk Prescription drug management.   This patient presents to the ED for chest pain, neck pain, numbness, this involves an extensive number of treatment options, and is a complaint that carries with a high risk of complications and morbidity.  The differential diagnosis includes CVA/ICH, tension type headache, migraine headaches, ACS/MI, pneumonia, pneumothorax, pericarditis, myocarditis, PE.  This is not an exhaustive list.  Lab tests: I ordered and personally interpreted labs.  The pertinent results include: WBC unremarkable. Hbg unremarkable. Platelets unremarkable. Electrolytes significant for potassium 3.2.. BUN, creatinine unremarkable.  D-dimer elevated at 0.6.  First troponin is less than 2.  Magnesium ordered and pending.  Imaging studies: I ordered imaging studies, personally reviewed, interpreted imaging and agree with the radiologist's interpretations. The results include: Negative chest x-ray.  Problem list/ ED course/ Critical interventions/ Medical management: HPI: See above Vital signs within normal range and stable throughout visit. Laboratory/imaging studies significant for: See above. On physical examination, patient is afebrile and appears in no acute distress.  EKG without ischemic changes, troponin is negative.  D-dimer was elevated so ordered CT angio chest to rule out PE.  Cannot rule out ACS/MI at this point with single troponin.  There was no focal  neurological deficit on my examination.  CT angio head and neck ordered and pending. I have reviewed the patient home medicines and have made adjustments as needed.  Cardiac monitoring/EKG: The patient was maintained on a cardiac  monitor.  I personally reviewed and interpreted the cardiac monitor which showed an underlying rhythm of: sinus rhythm.  Additional history obtained: External records from outside source obtained and reviewed including: Chart review including previous notes, labs, imaging.  Consultations obtained:  Disposition Patient care signed out at shift change to [facility] PA-C with pending imagings and labs.  This chart was dictated using voice recognition software.  Despite best efforts to proofread,  errors can occur which can change the documentation meaning.          Final Clinical Impression(s) / ED Diagnoses Final diagnoses:  Chest pain, unspecified type  Neck pain    Rx / DC Orders ED Discharge Orders     None         Jeanelle Malling, [place] 12/18/22 1524    Loetta Rough, MD 12/21/22 1555

## 2022-12-18 NOTE — ED Triage Notes (Signed)
"  Yesterday I woke up from a nap late afternoon and I went to scratch my head and I couldn't feel myself scratching my head, thought it was weird but I had been laying on it and it went away. Then this morning at 0900 I was brushing my teeth and the left side of my neck started throbbing and then I felt weird and had some shortness of breath and pressure in middle of my chest that will not go away. I took asprin and one nitroglycerin pill with no relief" per pt

## 2022-12-21 DIAGNOSIS — E2740 Unspecified adrenocortical insufficiency: Secondary | ICD-10-CM | POA: Diagnosis not present

## 2022-12-21 DIAGNOSIS — Z0000 Encounter for general adult medical examination without abnormal findings: Secondary | ICD-10-CM | POA: Diagnosis not present

## 2022-12-21 DIAGNOSIS — I10 Essential (primary) hypertension: Secondary | ICD-10-CM | POA: Diagnosis not present

## 2022-12-27 DIAGNOSIS — M810 Age-related osteoporosis without current pathological fracture: Secondary | ICD-10-CM | POA: Diagnosis not present

## 2022-12-27 DIAGNOSIS — R9389 Abnormal findings on diagnostic imaging of other specified body structures: Secondary | ICD-10-CM | POA: Diagnosis not present

## 2022-12-27 DIAGNOSIS — M1991 Primary osteoarthritis, unspecified site: Secondary | ICD-10-CM | POA: Diagnosis not present

## 2022-12-27 DIAGNOSIS — Z6826 Body mass index (BMI) 26.0-26.9, adult: Secondary | ICD-10-CM | POA: Diagnosis not present

## 2022-12-27 DIAGNOSIS — Z79899 Other long term (current) drug therapy: Secondary | ICD-10-CM | POA: Diagnosis not present

## 2022-12-27 DIAGNOSIS — E663 Overweight: Secondary | ICD-10-CM | POA: Diagnosis not present

## 2022-12-27 DIAGNOSIS — M25531 Pain in right wrist: Secondary | ICD-10-CM | POA: Diagnosis not present

## 2022-12-27 DIAGNOSIS — M0579 Rheumatoid arthritis with rheumatoid factor of multiple sites without organ or systems involvement: Secondary | ICD-10-CM | POA: Diagnosis not present

## 2023-01-04 ENCOUNTER — Other Ambulatory Visit: Payer: Self-pay | Admitting: Cardiovascular Disease

## 2023-01-04 DIAGNOSIS — M0579 Rheumatoid arthritis with rheumatoid factor of multiple sites without organ or systems involvement: Secondary | ICD-10-CM | POA: Diagnosis not present

## 2023-01-09 ENCOUNTER — Other Ambulatory Visit (HOSPITAL_BASED_OUTPATIENT_CLINIC_OR_DEPARTMENT_OTHER): Payer: Self-pay

## 2023-01-09 DIAGNOSIS — Z23 Encounter for immunization: Secondary | ICD-10-CM | POA: Diagnosis not present

## 2023-01-09 MED ORDER — INFLUENZA VAC A&B SURF ANT ADJ 0.5 ML IM SUSY
0.5000 mL | PREFILLED_SYRINGE | Freq: Once | INTRAMUSCULAR | 0 refills | Status: AC
  Filled 2023-01-09: qty 0.5, 1d supply, fill #0

## 2023-01-13 ENCOUNTER — Encounter: Payer: Self-pay | Admitting: Internal Medicine

## 2023-01-13 ENCOUNTER — Ambulatory Visit (INDEPENDENT_AMBULATORY_CARE_PROVIDER_SITE_OTHER): Payer: Medicare Other | Admitting: Internal Medicine

## 2023-01-13 VITALS — BP 118/77 | HR 73 | Temp 98.1°F | Ht 63.0 in | Wt 154.0 lb

## 2023-01-13 DIAGNOSIS — M069 Rheumatoid arthritis, unspecified: Secondary | ICD-10-CM | POA: Diagnosis not present

## 2023-01-13 DIAGNOSIS — J4540 Moderate persistent asthma, uncomplicated: Secondary | ICD-10-CM | POA: Diagnosis not present

## 2023-01-13 DIAGNOSIS — Z79899 Other long term (current) drug therapy: Secondary | ICD-10-CM | POA: Diagnosis not present

## 2023-01-13 DIAGNOSIS — R918 Other nonspecific abnormal finding of lung field: Secondary | ICD-10-CM | POA: Diagnosis not present

## 2023-01-13 LAB — NITRIC OXIDE: Nitric Oxide: 66

## 2023-01-13 MED ORDER — BREZTRI AEROSPHERE 160-9-4.8 MCG/ACT IN AERO: 2.0000 | INHALATION_SPRAY | Freq: Two times a day (BID) | RESPIRATORY_TRACT | Status: DC

## 2023-01-13 NOTE — Patient Instructions (Addendum)
ICD-10-CM   1. Moderate persistent asthma without complication  J45.40 Nitric oxide    2. On abatacept therapy  Z79.899     3. Rheumatoid arthritis involving multiple sites, unspecified whether rheumatoid factor present (HCC)  M06.9     4. Pulmonary infiltrate present on computed tomography  R91.8        Unclear cause of bronchitis episode that put you in the ER 12/18/2022.  Unclear if this is because of background Orencia or suboptimal control of asthma despite Symbicort because your exam nitric oxide continues to be elevated greater than 50 ppb and today is greater than 60  CT scan September 2024 mild upper lobe pulmonary tree-in-bud infiltrates noted per radiology [not fully appreciable for me]   Plan -shared decision making  - Stop Symbicort - Start Breztri 2 puff 2 times daily [this is step up inhaler therapy] -Depending on course we will have to discontinue Gaye Pollack if having multiple respiratory exacerbations] will consider biologic therapy or no change\ -Will have CT scan of the chest in 1 year but we can decide this at some point in the future  Follow-up - Return in 3 months or sooner if needed  -check ACT score and also FeNO at follow-up  -Let us know if at anytime you have flareup

## 2023-01-13 NOTE — Progress Notes (Signed)
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 Juleen China, intermittent chest tightness, no wheezing or cough   wheezng started roughly the same year she quit smoking assoc with wt gain x 35lb wt gain typically better p prednisone and better since spring 2017  On asthmanex 2 each am with rare saba needed but then Feb 10 2016 chest tightness and did not try saba > ER 02/21/16 with w/u neg w/u except for a/f levels both max sinuses> Rx Rosen= prednsione > back to baseline at time of ov with no cough or sob or chest tightness and  Not limited by breathing from desired activities including treadmill at 3.5 mph flat x 1.5 miles  Other than assoc of flares with cold/sinus infections >> No obvious patterns in day to day or daytime variability or assoc excess/ purulent sputum or mucus plugs or hemoptysis or cp or chest tightness, subjective wheeze or overt   hb symptoms. No unusual exp hx or h/o childhood pna/ asthma or knowledge of premature birth. 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  No new complaints. 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 She has not had to use her albuterol inhaler at all and has only been using the symbicort 1-2 puffs every am.   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 Juleen China, intermittent chest tightness, no wheezing or cough   wheezng started roughly the same year she quit smoking assoc with wt gain x 35lb wt gain typically better p prednisone and better since spring 2017  On asthmanex 2 each am with rare saba needed but then Feb 10 2016 chest tightness and did not try saba > ER 02/21/16 with w/u neg w/u except for a/f levels both max sinuses> Rx Rosen= prednsione > back to baseline at time of ov with no cough or sob or chest tightness and  Not limited by breathing from desired activities including treadmill at 3.5 mph flat x 1.5 miles  Other than assoc of flares with cold/sinus infections >> No obvious patterns in day to day or daytime variability or assoc excess/ purulent sputum or mucus plugs or hemoptysis or cp or chest tightness, subjective wheeze or overt   hb symptoms. No unusual exp hx or h/o childhood pna/ asthma or knowledge of premature birth. 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  No new complaints. 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 She has not had to use her albuterol inhaler at all and has only been using the symbicort 1-2 puffs every am.   symb 80 1-2 each am at most never using the 2 bid max  Not limited by breathing from desired activities   No cough   No obvious day to day or  Needs refill on Breo inhaler.  Has not used Breo in about a month.     HPI GYPSY DAHLIN 72 y.o. -returns for follow-up.  She last saw me personally in May 2023.  Then in September 22023 Tammy Parrott.  At the time the exam nitric oxide was slightly high.  Then after Christmas 2023 she had influenza and got another round of prednisone.  Currently she is doing well.  She ran out of Breo a month ago and she still doing well ACT scores are at baseline.  She is on Singulair but she is out of [place].  She is wondering whether she should restart the [facility] or not. FENO IS HIGH52ppb  Social - Husband who is esophageal cancer and has a feeding tube is now getting at least 350 cal orally.  He is eating peaches and cheese crackers. - Daughter is in [place] -currently she is safe    OV 01/13/2023  Subjective:  Patient ID: Rory Percy, female , DOB: 13-Sep-1950 , age 72 y.o. , MRN: [id number] , ADDRESS: [address] [place] [address] PCP Adrian Prince, MD Patient Care Team: Adrian Prince, MD as PCP - General (Endocrinology) Annamaria Helling, MD as PCP - OBGYN (Obstetrics and Gynecology) Wendall Stade, MD as PCP - Cardiology (Cardiology)  This Provider for this visit: Treatment Team:  Attending Provider: Kalman Shan, MD    01/13/2023 -   Chief Complaint  Patient presents with   Follow-up    Breathing is overall doing well. She has sneezing and cough with clear sputum in the mornings. FENO= 66 today.      HPI SHENIQUE HARJU 72 y.o. -returns for follow-up.  I saw her last in the spring 2024.  After that overall she was stable but December 18, 2022 she returned to the emergency department with multiple acute complaints.  I personally reviewed the external records.  Visualized the CT scan and the  blood work.  Diagnose of acute bronchitis based on CT scan findings pulmonary embolism ruled out.  Right upper lobe tree-in-bud infiltrates noted.  But I do not appreciate that.  She says she was having head numbness shortness of breath chest pressure but no cough or wheezing.  She was treated with antibiotic and she is at baseline.  She feels great ACT score is good but nitric oxide is actually gone up from her baseline of 50 to greater than 60.  She is surprised by this.  We she is interested in therapeutic options she is worried her asthma would be getting out of control.  Review of the labs indicate she always has high elevated baseline eosinophils.  Skin allergy testing with Dr. [place] Callas was positive some years ago  Of note for the last 2 years she is on Orencia which is known to cause respiratory exacerbations particularly in COPD patients.  She did have a recent infusion of that.  She thought her MCHC in the blood work was low but at least in September in our records it was normal and I showed her this.    Asthma Control Test ACT Total Score  01/13/2023  9:48 AM 23  06/07/2022  2:58 PM 23  12/02/2021  8:56 AM 24     Lab Results  Component Value Date   NITRICOXIDE 66 01/13/2023      PFT     Latest Ref Rng & Units 01/23/2020    8:43 AM 01/13/2017    8:40 AM  ILD indicators  even at baseline she is short of breath with exertion relieved by rest.  She says is not normal for her.  She recently had a cardiac cath and she reports this is normal.  I reviewed the chart and agree with that.  She is also dealing with her husband has esophageal cancer.  Recently was in the ER because of' false Hyperkalemia according to history.  She is currently on TNF alpha blockade and methotrexate for her rheumatoid arthritis.  She says the joint symptoms have not fully improved.  She is waiting for the third  dose of the TNF alpha blockade before she is expecting to show improvement in her symptoms.  Overall the last few months have been high level of social stress.  She thinks it is slowly settling down.  She feels like things are changed after getting her Covid vaccine.  Nevertheless she understands she is immunosuppressed and she is in need of a booster.  She has some trepidation about getting the booster.  She is willing to have a IgG antibody levels checked as a means of triage her understanding antibody response to previous Covid vaccine.  Today she pulmonary function test in my view shows obstruction with significant bronchodilator response consistent with an asthma pattern.  Documented below.  I reviewed the graph.  Nitric oxide test and asthma control test scores are listed below and shows disease activity   Asthma Control Test ACT Total Score  01/23/2020 18  08/29/2019 24     Lab Results  Component Value Date   NITRICOXIDE 34 01/23/2020      OV 03/04/2021  Subjective:  Patient ID: Rory Percy, female , DOB: 1951-02-25 , age 71 y.o. , MRN: [id number] , ADDRESS: [address] [place] [address] PCP Adrian Prince, MD Patient Care Team: Adrian Prince, MD as PCP - General (Endocrinology) Annamaria Helling, MD as PCP - OBGYN (Obstetrics and Gynecology) Wendall Stade, MD as PCP - Cardiology (Cardiology)  This Provider for this visit: Treatment Team:  Attending Provider: Kalman Shan, MD    03/04/2021 -   Chief Complaint  Patient presents with   Follow-up    No new concerns     HPI REISA FUNK 72 y.o. -returns for follow-up.  She tells me overall her asthma is doing well.  In September/October 2022 Dr. Brynda Peon added Singulair.  She was referred to allergist.  Work-up there has been reassuring.  She would not plan to go back to allergy clinic again.  She saw Dr. [place] Callas.  She feels the [facility] and Singulair have helped her.  She wants a refill on her albuterol.   Her ACT control score shows improvement.  However her exhaled nitric oxide score is 55 and is borderline high.  However she is not feeling it.  Of note she had a fourth COVID mRNA booster vaccine against delta and oh micron.  However 2 days after that she got significant side effects of joint flare and like fatigue.  She does not want to do the mRNA vaccine again.  This is second time she has had significant side effects.  She needed prednisone this time.  After the second shot she ended up in the ER with tachycardia.  I have advised her that mRNA vaccine should be listed as allergy.  She is agreeable.     OV 06/01/2021  Subjective:  Patient ID: Rory Percy, female , DOB: 1951-02-07 , age 62 y.o. , MRN: [id number] , [address]: [address]  Needs refill on Breo inhaler.  Has not used Breo in about a month.     HPI GYPSY DAHLIN 72 y.o. -returns for follow-up.  She last saw me personally in May 2023.  Then in September 22023 Tammy Parrott.  At the time the exam nitric oxide was slightly high.  Then after Christmas 2023 she had influenza and got another round of prednisone.  Currently she is doing well.  She ran out of Breo a month ago and she still doing well ACT scores are at baseline.  She is on Singulair but she is out of [place].  She is wondering whether she should restart the [facility] or not. FENO IS HIGH52ppb  Social - Husband who is esophageal cancer and has a feeding tube is now getting at least 350 cal orally.  He is eating peaches and cheese crackers. - Daughter is in [place] -currently she is safe    OV 01/13/2023  Subjective:  Patient ID: Rory Percy, female , DOB: 13-Sep-1950 , age 72 y.o. , MRN: [id number] , ADDRESS: [address] [place] [address] PCP Adrian Prince, MD Patient Care Team: Adrian Prince, MD as PCP - General (Endocrinology) Annamaria Helling, MD as PCP - OBGYN (Obstetrics and Gynecology) Wendall Stade, MD as PCP - Cardiology (Cardiology)  This Provider for this visit: Treatment Team:  Attending Provider: Kalman Shan, MD    01/13/2023 -   Chief Complaint  Patient presents with   Follow-up    Breathing is overall doing well. She has sneezing and cough with clear sputum in the mornings. FENO= 66 today.      HPI SHENIQUE HARJU 72 y.o. -returns for follow-up.  I saw her last in the spring 2024.  After that overall she was stable but December 18, 2022 she returned to the emergency department with multiple acute complaints.  I personally reviewed the external records.  Visualized the CT scan and the  blood work.  Diagnose of acute bronchitis based on CT scan findings pulmonary embolism ruled out.  Right upper lobe tree-in-bud infiltrates noted.  But I do not appreciate that.  She says she was having head numbness shortness of breath chest pressure but no cough or wheezing.  She was treated with antibiotic and she is at baseline.  She feels great ACT score is good but nitric oxide is actually gone up from her baseline of 50 to greater than 60.  She is surprised by this.  We she is interested in therapeutic options she is worried her asthma would be getting out of control.  Review of the labs indicate she always has high elevated baseline eosinophils.  Skin allergy testing with Dr. [place] Callas was positive some years ago  Of note for the last 2 years she is on Orencia which is known to cause respiratory exacerbations particularly in COPD patients.  She did have a recent infusion of that.  She thought her MCHC in the blood work was low but at least in September in our records it was normal and I showed her this.    Asthma Control Test ACT Total Score  01/13/2023  9:48 AM 23  06/07/2022  2:58 PM 23  12/02/2021  8:56 AM 24     Lab Results  Component Value Date   NITRICOXIDE 66 01/13/2023      PFT     Latest Ref Rng & Units 01/23/2020    8:43 AM 01/13/2017    8:40 AM  ILD indicators  ER notes.  She was observed in the ER for whole day and her symptoms and signs resolved and the hypoxemia also resolved.  She was discharged with a prednisone course.  She says subsequent to that she was short of breath and fatigue for 2 weeks and this also resolved.  She did see cardiology in February 2021.  She was reassured by a normal stress test.  After that she switched primary care physicians to Dr. Adrian Prince.  She is now established with a new rheumatologist at [facility] and is seen Elpidio Anis the physician's assistant for the first time on August 27, 2019 2 days ago.  She also had a thyroid biopsy which was nondiagnostic.  On the rheumatology visit 2 days ago it was felt her rheumatoid arthritis was active.  Blood has been drawn and work-up is in progress.  Through all this primary care physician did do CT chest without contrast.  I personally visualized this.  There is evidence of tree-in-bud that is mild and subtle.  Therefore she is referred here.  However at this point in time she is asymptomatic from a respiratory standpoint no wheezing no orthopnea no proximal nocturnal dyspnea no dyspnea on exertion no cough.  However in terms of rheumatoid arthritis there is plans to increase her immunomodulation. She has been started on low dose pred now by Elpidio Anis. Currently labs  pending are CRP, Quant Gold and Hep Panel   D/w Elpidio Anis later -> she is considering adding DMARD -discussed and she is supportive of getting bronch and rule out opportunistic infection  ROS - per HPI  IMPRESSION: CT chest visualized 1. Very mild, stable tree-in-bud appearing opacities along the anterolateral aspect of the bilateral upper lobes. Sequelae associated with atypical infection cannot be excluded. 2. Predominant stable thyroid goiter.   Aortic Atherosclerosis (ICD10-I70.0).     Electronically Signed   By: Aram Candela M.D.   On: 07/22/2019 22:34    OV 10/17/2019  Subjective:  Patient ID: Rory Percy, female , DOB: 01/05/1951 , age 16 y.o. , MRN: [id number] , ADDRESS: [address] [place] [address]   10/17/2019 -   Chief Complaint  Patient presents with   Follow-up    Pt is here to discuss results of the bronch. Pt states she has been doing good since last visit and denies any complaints.     HPI MANAHIL CAMP 72 y.o. -returns for follow-up.  At this point in time she is on prednisone 20 mg/day.  She has gained weight.  She is got some bruises.  She is not happy about her prednisone.  She denies any respiratory complaints.  Mid June 2020 when she underwent bronchoscopy with lavage.  I reviewed the results.  She is got some mild neutrophilia but organisms such as PCP and TB and fungus are negative.  She really wants to get started on a DMA RD so she can come down or get off prednisone.  I discussed with rheumatology PA Elpidio Anis and she is of the understanding.  I stated there is no ILD.  They are going to focus on joint treatment.  Patient did indicate that in the fall she is at risk for respiratory exacerbations.  But she lives nearby and is agreeable to a 9-6-month follow-up.  She is supposed to get pulmonary function test today but that was not scheduled by our office.  Results for YAZLYN, PROCK (MRN [id number]) as of 10/17/2019 16:38  daytime variability or assoc excess/ purulent sputum or mucus plugs or hemoptysis or cp or chest tightness, subjective wheeze or overt sinus or hb symptoms. No unusual exp hx or h/o childhood pna/ asthma or knowledge of premature birth.  Sleeping ok flat without nocturnal  or early am exacerbation  of respiratory  c/o's or need for noct saba. Also denies any obvious fluctuation of symptoms with weather or environmental changes or other aggravating or alleviating factors except as outlined above   Current Allergies, Complete Past Medical History, Past Surgical History, Family History, and Social History were reviewed in [name] record.  ROS  The following are not active complaints unless bolded Hoarseness, sore throat, dysphagia, dental problems, itching, sneezing,  nasal congestion or discharge of excess mucus or purulent secretions,  ear ache,   fever, chills, sweats, unintended wt loss or wt gain, classically pleuritic or exertional cp,  orthopnea pnd or leg swelling, presyncope, palpitations, abdominal pain, anorexia, nausea, vomiting, diarrhea  or change in bowel habits or change in bladder habits, change in stools or change in urine, dysuria, hematuria,  rash, arthralgias, visual complaints, headache, numbness, weakness or ataxia or problems with walking or coordination,  change in mood/affect or memory.       OV 08/29/2019  Subjective:  Patient ID: Rory Percy, female , DOB: Oct 12, 1950 , age 41 y.o. , MRN: [id number] , ADDRESS: [address] [place] [address]   08/29/2019 -   Chief Complaint  Patient presents with   Consult    Abnormal CT, hx of asthma. hx of low oxygen level and cough.    Transfer of care from Dr. Sandrea Hughs to Dr. Marchelle Gearing.  HPI EARNIE ARNE 72 y.o. -referred for possible abnormalities in the CT scan of the chest in April 2021 in the setting of rheumatoid arthritis 2018 (RF + and CCP > 250 per Elpidio Anis over phone) and being on [place] and longstanding history of allergic asthma on dulera. 20 pack former smoking   73 year old female.  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  She reports a many year many decade history of allergic asthma which flares up in the fall.  Between episodes she is not on any maintenance treatment.  In the fall when it flares up her previous primary care physician Dr. Shawna Clamp retired] would give her antibiotics and prednisone one course and this would help it.  However in 2020 when the flareup happened she could not get access to the prednisone and antibiotics because the primary care physician retired and in the setting of Covid it was televisit.  By the end of 2020 she ended up with 2 rounds of antibiotics and prednisone that helped clear things up.  In the interim in the last 4 years she has had a diagnosis of  rheumatoid arthritis.  She was on methotrexate but had raised liver function test.  This seemed to help.  She failed Plaquenil and then was started on leflunomide which she is on currently.   In 2018 she did have spirometry and DLCO on pulmonary function testing that shows hyperinflation and air trapping with reduced DLCO but also normal spirometry.  In the backdrop of this in January 2020 when she underwent her first Covid vaccine.  Then in early February 2020 when she had a second Covid vaccine.  24 hours later she got hypotensive tachycardic and also hypoxemic and ended up in the ER.  I reviewed the  ER notes.  She was observed in the ER for whole day and her symptoms and signs resolved and the hypoxemia also resolved.  She was discharged with a prednisone course.  She says subsequent to that she was short of breath and fatigue for 2 weeks and this also resolved.  She did see cardiology in February 2021.  She was reassured by a normal stress test.  After that she switched primary care physicians to Dr. Adrian Prince.  She is now established with a new rheumatologist at [facility] and is seen Elpidio Anis the physician's assistant for the first time on August 27, 2019 2 days ago.  She also had a thyroid biopsy which was nondiagnostic.  On the rheumatology visit 2 days ago it was felt her rheumatoid arthritis was active.  Blood has been drawn and work-up is in progress.  Through all this primary care physician did do CT chest without contrast.  I personally visualized this.  There is evidence of tree-in-bud that is mild and subtle.  Therefore she is referred here.  However at this point in time she is asymptomatic from a respiratory standpoint no wheezing no orthopnea no proximal nocturnal dyspnea no dyspnea on exertion no cough.  However in terms of rheumatoid arthritis there is plans to increase her immunomodulation. She has been started on low dose pred now by Elpidio Anis. Currently labs  pending are CRP, Quant Gold and Hep Panel   D/w Elpidio Anis later -> she is considering adding DMARD -discussed and she is supportive of getting bronch and rule out opportunistic infection  ROS - per HPI  IMPRESSION: CT chest visualized 1. Very mild, stable tree-in-bud appearing opacities along the anterolateral aspect of the bilateral upper lobes. Sequelae associated with atypical infection cannot be excluded. 2. Predominant stable thyroid goiter.   Aortic Atherosclerosis (ICD10-I70.0).     Electronically Signed   By: Aram Candela M.D.   On: 07/22/2019 22:34    OV 10/17/2019  Subjective:  Patient ID: Rory Percy, female , DOB: 01/05/1951 , age 16 y.o. , MRN: [id number] , ADDRESS: [address] [place] [address]   10/17/2019 -   Chief Complaint  Patient presents with   Follow-up    Pt is here to discuss results of the bronch. Pt states she has been doing good since last visit and denies any complaints.     HPI MANAHIL CAMP 72 y.o. -returns for follow-up.  At this point in time she is on prednisone 20 mg/day.  She has gained weight.  She is got some bruises.  She is not happy about her prednisone.  She denies any respiratory complaints.  Mid June 2020 when she underwent bronchoscopy with lavage.  I reviewed the results.  She is got some mild neutrophilia but organisms such as PCP and TB and fungus are negative.  She really wants to get started on a DMA RD so she can come down or get off prednisone.  I discussed with rheumatology PA Elpidio Anis and she is of the understanding.  I stated there is no ILD.  They are going to focus on joint treatment.  Patient did indicate that in the fall she is at risk for respiratory exacerbations.  But she lives nearby and is agreeable to a 9-6-month follow-up.  She is supposed to get pulmonary function test today but that was not scheduled by our office.  Results for YAZLYN, PROCK (MRN [id number]) as of 10/17/2019 16:38  Ref.  Range 09/12/2019 08:59  Monocyte-Macrophage-Serous Fluid Latest Ref Range: 50 - 90 % 38 (L)  Other Cells, Fluid Latest Units: % CORRELATE WITH CYTOLOGY.  Fluid Type-FCT Unknown Bronch Lavag  Color, Fluid Latest Ref Range: YELLOW  COLORLESS (A)  Total Nucleated Cell Count, Fluid Latest Ref Range: 0 - 1,000 cu mm 15  Lymphs, Fluid Latest Units: % 4  Eos, Fluid Latest Units: % 2  Appearance, Fluid Latest Ref Range: CLEAR  CLOUDY (A)  Neutrophil Count, Fluid Latest Ref Range: 0 - 25 % 56 (H)   ROS - per HPI   Patient ID: Rory Percy, female    DOB: 27-Sep-1950, 72 y.o.   MRN: [id number]  Chief Complaint  Patient presents with   Follow-up    Cough     Referring provider: Adrian Prince, MD    12/10/2019 Acute OV : Cough  HPI: 72 year old female former smoker followed for abnormal CT chest with pulmonary infiltrates Medical history significant for rheumatoid arthritis    Patient presents for an acute office visit.  She complains of 1 week increased cough,congestion , and wheezing . Mucus is clear , hard to cough anything up.  She has underlying rheumatoid arthritis and is recently had her maintenance regimen changed to methotrexate in infliximab.    She had recent Covid test has been negative.  Her Covid vaccines are up-to-date.  She denies any fever or loss of taste or smell.   Patient is being followed for mild pulmonary infiltrates noted on CT chest.  She is negative for interstitial lung disease.  She underwent bronchoscopy in June 2021 that showed negative cultures and BAL.  Patient is followed by rheumatology and is on methotrexate and Renflexis     TEST/EVENTS :  In 2018 she did have spirometry and DLCO on pulmonary function testing that shows hyperinflation and air trapping with reduced DLCO but also normal spirometry.  CT chest July 22, 2019 showed very mild stable tree-in-bud appearing opacities along the bilateral upper lobes.  Bronchoscopy June 2021 showed cytology  positive for benign bronchial cells and pulmonary macrophages.,  AFB negative, BAL negative, M tubercular goes complex negative, fungal negative PCP negative   OV 01/23/2020  Subjective:  Patient ID: Rory Percy, female , DOB: 1951-01-16 , age 46 y.o. , MRN: [id number] , ADDRESS: [address] [place] [address] PCP Adrian Prince, MD Patient Care Team: Adrian Prince, MD as PCP - General (Endocrinology) Annamaria Helling, MD as PCP - OBGYN (Obstetrics and Gynecology) Wendall Stade, MD as PCP - Cardiology (Cardiology)  This Provider for this visit: Treatment Team:  Attending Provider: Kalman Shan, MD    01/23/2020 -   Chief Complaint  Patient presents with   Follow-up    Pt states she has been doing okay since last visit. States her breathing has been doing okay. Still becomes SOB but it happens out of nowhere and she isn't sure why or if it is coming due to her heart.   Rheumatoid arthritis with immunosuppression and mild pulmonary infiltrates.  BAL with neutrophilia blood culture negative-June 2021  Long history of allergic asthma previously on Dulera.  HPI BURLIE AMIR 72 y.o. -returns for follow-up.  Since last seeing me she is seen nurse practitioner in September 2021.  At that time she was wheezing.  She was given Depo-Medrol and outpatient Omnicef.  She says within a few days she responded to this.  Since then she feels back to baseline but she says  even at baseline she is short of breath with exertion relieved by rest.  She says is not normal for her.  She recently had a cardiac cath and she reports this is normal.  I reviewed the chart and agree with that.  She is also dealing with her husband has esophageal cancer.  Recently was in the ER because of' false Hyperkalemia according to history.  She is currently on TNF alpha blockade and methotrexate for her rheumatoid arthritis.  She says the joint symptoms have not fully improved.  She is waiting for the third  dose of the TNF alpha blockade before she is expecting to show improvement in her symptoms.  Overall the last few months have been high level of social stress.  She thinks it is slowly settling down.  She feels like things are changed after getting her Covid vaccine.  Nevertheless she understands she is immunosuppressed and she is in need of a booster.  She has some trepidation about getting the booster.  She is willing to have a IgG antibody levels checked as a means of triage her understanding antibody response to previous Covid vaccine.  Today she pulmonary function test in my view shows obstruction with significant bronchodilator response consistent with an asthma pattern.  Documented below.  I reviewed the graph.  Nitric oxide test and asthma control test scores are listed below and shows disease activity   Asthma Control Test ACT Total Score  01/23/2020 18  08/29/2019 24     Lab Results  Component Value Date   NITRICOXIDE 34 01/23/2020      OV 03/04/2021  Subjective:  Patient ID: Rory Percy, female , DOB: 1951-02-25 , age 71 y.o. , MRN: [id number] , ADDRESS: [address] [place] [address] PCP Adrian Prince, MD Patient Care Team: Adrian Prince, MD as PCP - General (Endocrinology) Annamaria Helling, MD as PCP - OBGYN (Obstetrics and Gynecology) Wendall Stade, MD as PCP - Cardiology (Cardiology)  This Provider for this visit: Treatment Team:  Attending Provider: Kalman Shan, MD    03/04/2021 -   Chief Complaint  Patient presents with   Follow-up    No new concerns     HPI REISA FUNK 72 y.o. -returns for follow-up.  She tells me overall her asthma is doing well.  In September/October 2022 Dr. Brynda Peon added Singulair.  She was referred to allergist.  Work-up there has been reassuring.  She would not plan to go back to allergy clinic again.  She saw Dr. [place] Callas.  She feels the [facility] and Singulair have helped her.  She wants a refill on her albuterol.   Her ACT control score shows improvement.  However her exhaled nitric oxide score is 55 and is borderline high.  However she is not feeling it.  Of note she had a fourth COVID mRNA booster vaccine against delta and oh micron.  However 2 days after that she got significant side effects of joint flare and like fatigue.  She does not want to do the mRNA vaccine again.  This is second time she has had significant side effects.  She needed prednisone this time.  After the second shot she ended up in the ER with tachycardia.  I have advised her that mRNA vaccine should be listed as allergy.  She is agreeable.     OV 06/01/2021  Subjective:  Patient ID: Rory Percy, female , DOB: 1951-02-07 , age 62 y.o. , MRN: [id number] , [address]: [address]  Needs refill on Breo inhaler.  Has not used Breo in about a month.     HPI GYPSY DAHLIN 72 y.o. -returns for follow-up.  She last saw me personally in May 2023.  Then in September [address] Tammy Parrott.  At the time the exam nitric oxide was slightly high.  Then after Christmas 2023 she had influenza and got another round of prednisone.  Currently she is doing well.  She ran out of Breo a month ago and she still doing well ACT scores are at baseline.  She is on Singulair but she is out of [place].  She is wondering whether she should restart the [facility] or not. FENO IS [id number]  Social - Husband who is esophageal cancer and has a feeding tube is now getting at least 350 cal orally.  He is eating peaches and cheese crackers. - Daughter is in [place] -currently she is safe    OV 01/13/2023  Subjective:  Patient ID: Rory Percy, female , DOB: 13-Sep-1950 , age 72 y.o. , MRN: [id number] , ADDRESS: [address] [place] [address] PCP Adrian Prince, MD Patient Care Team: Adrian Prince, MD as PCP - General (Endocrinology) Annamaria Helling, MD as PCP - OBGYN (Obstetrics and Gynecology) Wendall Stade, MD as PCP - Cardiology (Cardiology)  This Provider for this visit: Treatment Team:  Attending Provider: Kalman Shan, MD    01/13/2023 -   Chief Complaint  Patient presents with   Follow-up    Breathing is overall doing well. She has sneezing and cough with clear sputum in the mornings. FENO= 66 today.      HPI SHENIQUE HARJU 72 y.o. -returns for follow-up.  I saw her last in the spring 2024.  After that overall she was stable but December 18, 2022 she returned to the emergency department with multiple acute complaints.  I personally reviewed the external records.  Visualized the CT scan and the  blood work.  Diagnose of acute bronchitis based on CT scan findings pulmonary embolism ruled out.  Right upper lobe tree-in-bud infiltrates noted.  But I do not appreciate that.  She says she was having head numbness shortness of breath chest pressure but no cough or wheezing.  She was treated with antibiotic and she is at baseline.  She feels great ACT score is good but nitric oxide is actually gone up from her baseline of 50 to greater than 60.  She is surprised by this.  We she is interested in therapeutic options she is worried her asthma would be getting out of control.  Review of the labs indicate she always has high elevated baseline eosinophils.  Skin allergy testing with Dr. [place] Callas was positive some years ago  Of note for the last 2 years she is on Orencia which is known to cause respiratory exacerbations particularly in COPD patients.  She did have a recent infusion of that.  She thought her MCHC in the blood work was low but at least in September in our records it was normal and I showed her this.    Asthma Control Test ACT Total Score  01/13/2023  9:48 AM 23  06/07/2022  2:58 PM 23  12/02/2021  8:56 AM 24     Lab Results  Component Value Date   NITRICOXIDE 66 01/13/2023      PFT     Latest Ref Rng & Units 01/23/2020    8:43 AM 01/13/2017    8:40 AM  ILD indicators

## 2023-01-16 ENCOUNTER — Other Ambulatory Visit (HOSPITAL_BASED_OUTPATIENT_CLINIC_OR_DEPARTMENT_OTHER): Payer: Self-pay

## 2023-01-16 ENCOUNTER — Encounter (HOSPITAL_BASED_OUTPATIENT_CLINIC_OR_DEPARTMENT_OTHER): Payer: Self-pay | Admitting: *Deleted

## 2023-01-16 ENCOUNTER — Emergency Department (HOSPITAL_BASED_OUTPATIENT_CLINIC_OR_DEPARTMENT_OTHER): Payer: Medicare Other | Admitting: Radiology

## 2023-01-16 ENCOUNTER — Emergency Department (HOSPITAL_BASED_OUTPATIENT_CLINIC_OR_DEPARTMENT_OTHER)
Admission: EM | Admit: 2023-01-16 | Discharge: 2023-01-16 | Disposition: A | Discharge status: Home / Self Care | Payer: Medicare Other | Attending: Emergency Medicine | Admitting: Emergency Medicine

## 2023-01-16 ENCOUNTER — Emergency Department (HOSPITAL_BASED_OUTPATIENT_CLINIC_OR_DEPARTMENT_OTHER): Payer: Medicare Other

## 2023-01-16 ENCOUNTER — Other Ambulatory Visit: Payer: Self-pay

## 2023-01-16 DIAGNOSIS — R1012 Left upper quadrant pain: Secondary | ICD-10-CM | POA: Diagnosis not present

## 2023-01-16 DIAGNOSIS — I10 Essential (primary) hypertension: Secondary | ICD-10-CM | POA: Diagnosis not present

## 2023-01-16 DIAGNOSIS — M546 Pain in thoracic spine: Secondary | ICD-10-CM | POA: Diagnosis not present

## 2023-01-16 DIAGNOSIS — Z7982 Long term (current) use of aspirin: Secondary | ICD-10-CM | POA: Diagnosis not present

## 2023-01-16 DIAGNOSIS — R0781 Pleurodynia: Secondary | ICD-10-CM | POA: Diagnosis not present

## 2023-01-16 DIAGNOSIS — K5900 Constipation, unspecified: Secondary | ICD-10-CM | POA: Diagnosis not present

## 2023-01-16 DIAGNOSIS — K5730 Diverticulosis of large intestine without perforation or abscess without bleeding: Secondary | ICD-10-CM | POA: Diagnosis not present

## 2023-01-16 DIAGNOSIS — R079 Chest pain, unspecified: Secondary | ICD-10-CM | POA: Diagnosis not present

## 2023-01-16 DIAGNOSIS — Z79890 Hormone replacement therapy (postmenopausal): Secondary | ICD-10-CM | POA: Diagnosis not present

## 2023-01-16 DIAGNOSIS — J45909 Unspecified asthma, uncomplicated: Secondary | ICD-10-CM | POA: Diagnosis not present

## 2023-01-16 DIAGNOSIS — E039 Hypothyroidism, unspecified: Secondary | ICD-10-CM | POA: Diagnosis not present

## 2023-01-16 DIAGNOSIS — M419 Scoliosis, unspecified: Secondary | ICD-10-CM | POA: Diagnosis not present

## 2023-01-16 DIAGNOSIS — R109 Unspecified abdominal pain: Secondary | ICD-10-CM | POA: Diagnosis present

## 2023-01-16 DIAGNOSIS — Z79899 Other long term (current) drug therapy: Secondary | ICD-10-CM | POA: Diagnosis not present

## 2023-01-16 DIAGNOSIS — K449 Diaphragmatic hernia without obstruction or gangrene: Secondary | ICD-10-CM | POA: Diagnosis not present

## 2023-01-16 LAB — CBC WITH DIFFERENTIAL/PLATELET
Abs Immature Granulocytes: 0.02 10*3/uL (ref 0.00–0.07)
Basophils Absolute: 0.1 10*3/uL (ref 0.0–0.1)
Basophils Relative: 1 %
Eosinophils Absolute: 0.4 10*3/uL (ref 0.0–0.5)
Eosinophils Relative: 5 %
HCT: 40.5 % (ref 36.0–46.0)
Hemoglobin: 13.1 g/dL (ref 12.0–15.0)
Immature Granulocytes: 0 %
Lymphocytes Relative: 28 %
Lymphs Abs: 2.4 10*3/uL (ref 0.7–4.0)
MCH: 29 pg (ref 26.0–34.0)
MCHC: 32.3 g/dL (ref 30.0–36.0)
MCV: 89.6 fL (ref 80.0–100.0)
Monocytes Absolute: 0.8 10*3/uL (ref 0.1–1.0)
Monocytes Relative: 9 %
Neutro Abs: 4.7 10*3/uL (ref 1.7–7.7)
Neutrophils Relative %: 57 %
Platelets: 312 10*3/uL (ref 150–400)
RBC: 4.52 MIL/uL (ref 3.87–5.11)
RDW: 14.4 % (ref 11.5–15.5)
WBC: 8.4 10*3/uL (ref 4.0–10.5)
nRBC: 0 % (ref 0.0–0.2)

## 2023-01-16 LAB — URINALYSIS, ROUTINE W REFLEX MICROSCOPIC
Bilirubin Urine: NEGATIVE
Glucose, UA: NEGATIVE mg/dL
Hgb urine dipstick: NEGATIVE
Ketones, ur: NEGATIVE mg/dL
Leukocytes,Ua: NEGATIVE
Nitrite: NEGATIVE
Specific Gravity, Urine: 1.024 (ref 1.005–1.030)
pH: 7 (ref 5.0–8.0)

## 2023-01-16 LAB — BASIC METABOLIC PANEL
Anion gap: 6 (ref 5–15)
BUN: 14 mg/dL (ref 8–23)
CO2: 28 mmol/L (ref 22–32)
Calcium: 9.9 mg/dL (ref 8.9–10.3)
Chloride: 106 mmol/L (ref 98–111)
Creatinine, Ser: 0.83 mg/dL (ref 0.44–1.00)
GFR, Estimated: >60 mL/min (ref >60)
Glucose, Bld: 101 mg/dL — ABNORMAL HIGH (ref 70–99)
Potassium: 3.8 mmol/L (ref 3.5–5.1)
Sodium: 140 mmol/L (ref 135–145)

## 2023-01-16 MED ORDER — HYDROCODONE-ACETAMINOPHEN 5-325 MG PO TABS
2.0000 | ORAL_TABLET | Freq: Once | ORAL | Status: DC
  Filled 2023-01-16: qty 2

## 2023-01-16 MED ORDER — METHOCARBAMOL 500 MG PO TABS
500.0000 mg | ORAL_TABLET | Freq: Two times a day (BID) | ORAL | 0 refills | Status: DC
  Filled 2023-01-16: qty 10, 5d supply, fill #0

## 2023-01-16 MED ORDER — DIAZEPAM 2 MG PO TABS: 2.0000 mg | ORAL_TABLET | Freq: Once | ORAL | Status: DC

## 2023-01-16 MED ORDER — KETOROLAC TROMETHAMINE 30 MG/ML IJ SOLN
30.0000 mg | Freq: Once | INTRAMUSCULAR | Status: AC
  Administered 2023-01-16: 30 mg via INTRAVENOUS
  Filled 2023-01-16: qty 1

## 2023-01-16 MED ORDER — HYDROCODONE-ACETAMINOPHEN 5-325 MG PO TABS
1.0000 | ORAL_TABLET | Freq: Four times a day (QID) | ORAL | 0 refills | Status: DC | PRN
  Filled 2023-01-16: qty 5, 1d supply, fill #0

## 2023-01-16 MED ORDER — DEXAMETHASONE SODIUM PHOSPHATE 10 MG/ML IJ SOLN
10.0000 mg | Freq: Once | INTRAMUSCULAR | Status: AC
  Administered 2023-01-16: 10 mg via INTRAVENOUS
  Filled 2023-01-16: qty 1

## 2023-01-16 MED ORDER — HYDROCODONE-ACETAMINOPHEN 5-325 MG PO TABS: 1.0000 | ORAL_TABLET | Freq: Four times a day (QID) | ORAL | 0 refills | Status: DC | PRN

## 2023-01-16 MED ORDER — HYDROCODONE-ACETAMINOPHEN 5-325 MG PO TABS
1.0000 | ORAL_TABLET | Freq: Once | ORAL | Status: AC
  Administered 2023-01-16: 1 via ORAL

## 2023-01-16 NOTE — Discharge Instructions (Addendum)
Begin taking ibuprofen 600 mg every 6 hours as needed for pain.  Begin taking hydrocodone as prescribed as needed for pain not relieved with ibuprofen.  Rest.  Follow-up with primary doctor if not improving in the next few days, and return to the ER for worsening pain, high fever, difficulty breathing, bloody stools, or for other new and concerning symptoms.  Then take the pain medicine if you feel like you need it. Narcotics do not help with the pain, they only make you care about it less.  You can become addicted to this, people may break into your house to steal it.  It will constipate you.  If you drive under the influence of this medicine you can get a DUI.

## 2023-01-16 NOTE — ED Provider Notes (Signed)
Received patient in turnover from Dr. Judd Lien.  Please see their note for further details of Hx, PE.  Briefly patient is a 72 y.o. female with a Back Pain .  Sharp pain while picking up a yoga block.  Awaiting labs, UA.  The patient's lab work is resulted without significant electrolyte abnormality, no leukocytosis no anemia.  UA without hematuria negative for infection on my independent interpretation.  On reassessment the patient has pain to the left thoracic back that radiates around to the costal margin.  She has no significant intra-abdominal discomfort on my exam but significant pain along that lower rib margin.  No appreciable rash.  Discussed likely diagnosis with the patient.  Will have her follow-up with her family doctor in the office.    Melene Plan, DO 01/16/23 [phone]

## 2023-01-16 NOTE — ED Provider Notes (Signed)
[place] EMERGENCY DEPARTMENT AT [facility] Provider Note   CSN: [id number] Arrival date & time: 01/16/23  1027     History  Chief Complaint  Patient presents with   Back Pain    Brandy Townsend is a 72 y.o. female.  Patient is a 72 year old female with history of asthma, hypothyroidism, hypertension.  Patient presenting today with complaints of chest and abdominal pain.  Yesterday, she bent over to pick up a yoga block when she felt a sudden pain and a knot to the left side of her chest and left upper abdomen.  It has been bothering her since.  She has been taking over-the-counter medication with little relief.  Pain is worse when she takes a deep breath or moves.  She has had kidney stones in the past, but this feels different.  The history is provided by the patient.       Home Medications Prior to Admission medications   Medication Sig Start Date End Date Taking? Authorizing Provider  abatacept (ORENCIA) 250 MG injection Inject 250 mg into the vein See admin instructions. Inject 250 mg IV once every month.    [provider]  albuterol (VENTOLIN HFA) 108 (90 Base) MCG/ACT inhaler Inhale 1-2 puffs into the lungs every 6 (six) hours as needed for wheezing or shortness of breath. 12/02/21   Parrett, Virgel Bouquet, NP  aspirin EC 81 MG tablet Take 1 tablet (81 mg total) by mouth daily. Swallow whole. 12/17/19   Rosalio Macadamia, NP  Budeson-Glycopyrrol-Formoterol (BREZTRI AEROSPHERE) 160-9-4.8 MCG/ACT AERO Inhale 2 puffs into the lungs in the morning and at bedtime. 01/13/23   Kalman Shan, MD  budesonide-formoterol (BREYNA) 160-4.5 MCG/ACT inhaler Inhale 2 puffs into the lungs in the morning and at bedtime. 08/23/22   Nyoka Cowden, MD  cetirizine (ZYRTEC) 10 MG tablet Take 10 mg by mouth at bedtime as needed for allergies.     [provider]  diclofenac Sodium (VOLTAREN) 1 % GEL Apply 4 g topically 4 (four) times daily. 11/12/22   Palumbo, April, MD   KLOR-CON 10 10 MEQ tablet Take 10 mEq by mouth daily. 08/11/14   [provider]  levothyroxine (SYNTHROID) 50 MCG tablet Take 50 mcg by mouth daily.  08/16/18   [provider]  montelukast (SINGULAIR) 10 MG tablet Take 10 mg by mouth daily. 11/17/20   [provider]  nitroGLYCERIN (NITROSTAT) 0.4 MG SL tablet Place 1 tablet (0.4 mg total) under the tongue every 5 (five) minutes as needed for chest pain. 05/27/20   Wendall Stade, MD  ondansetron (ZOFRAN-ODT) 4 MG disintegrating tablet Take 1 tablet (4 mg total) by mouth every 8 (eight) hours as needed for nausea or vomiting. 12/18/22   Schutt, Edsel Petrin, PA-C  Probiotic Product (PROBIOTIC GUMMIES PO) Take by mouth.    [provider]  rosuvastatin (CRESTOR) 10 MG tablet TAKE 1 TABLET BY MOUTH EVERY DAY 10/04/22   Wendall Stade, MD  torsemide (DEMADEX) 20 MG tablet Take 1 tablet (20 mg total) by mouth daily. 05/15/19   Rosalio Macadamia, NP  verapamil (CALAN-SR) 240 MG CR tablet TAKE 1 TABLET BY MOUTH EVERY DAY 01/05/23   Wendall Stade, MD  Vitamin D, Ergocalciferol, (DRISDOL) 1.25 MG (50000 UNIT) CAPS capsule Take 50,000 Units by mouth every 7 (seven) days. Monday    [provider]      Allergies    Cat hair extract, Dust mite extract, Morphine and codeine, and Macrobid [  nitrofurantoin monohyd macro]    Review of Systems   Review of Systems  All other systems reviewed and are negative.   Physical Exam Updated Vital Signs BP (!) 140/100   Pulse 61   Temp 97.9 F (36.6 C)   Resp 20   Ht 5\' 3"  (1.6 m)   Wt 68 kg   SpO2 98%   BMI 26.57 kg/m  Physical Exam Vitals and nursing note reviewed.  Constitutional:      General: She is not in acute distress.    Appearance: She is well-developed. She is not diaphoretic.  HENT:     Head: Normocephalic and atraumatic.  Cardiovascular:     Rate and Rhythm: Normal rate and regular rhythm.     Heart sounds: No murmur heard.    No friction rub. No  gallop.  Pulmonary:     Effort: Pulmonary effort is normal. No respiratory distress.     Breath sounds: Normal breath sounds. No wheezing.     Comments: There is tenderness to palpation of the left lateral ribs in his left upper quadrant.  There is no crepitus or palpable abnormality. Abdominal:     General: Bowel sounds are normal. There is no distension.     Palpations: Abdomen is soft.     Tenderness: There is no abdominal tenderness.  Musculoskeletal:        General: Normal range of motion.     Cervical back: Normal range of motion and neck supple.  Skin:    General: Skin is warm and dry.  Neurological:     General: No focal deficit present.     Mental Status: She is alert and oriented to person, place, and time.     ED Results / Procedures / Treatments   Labs (all labs ordered are listed, but only abnormal results are displayed) Labs Reviewed  BASIC METABOLIC PANEL  CBC WITH DIFFERENTIAL/PLATELET  URINALYSIS, ROUTINE W REFLEX MICROSCOPIC    EKG None  Radiology No results found.  Procedures Procedures    Medications Ordered in ED Medications  ketorolac (TORADOL) 30 MG/ML injection 30 mg (has no administration in time range)    ED Course/ Medical Decision Making/ A&P  Patient is a 72 year old female presenting with left-sided chest wall and upper abdominal pain as described in the HPI.  This started suddenly after lifting up a yoga block at home.  Pain is worse when she moves or changes position or breathes.  Patient arrives here with stable vital signs and is afebrile.  There is no hypoxia.  On physical examination, she is tender to the left lateral and anterior ribs and left upper quadrant, but no peritoneal signs.  Breath sounds are clear and equal bilaterally.  Workup initiated including CBC, basic metabolic panel, and urinalysis which are currently pending.  Also obtained was a CT scan with renal protocol and x-rays of the left ribs.  Both of these studies  were unremarkable and provided no explanation for the patient's pain.  She was given IV Toradol followed by Decadron and Vicodin.  Patient's laboratory studies remain pending and care will be signed out to Dr. Adela Lank at shift change.  He will follow-up these results and determine the final disposition.  Final Clinical Impression(s) / ED Diagnoses Final diagnoses:  None    Rx / DC Orders ED Discharge Orders     None         Geoffery Lyons, MD 01/16/23 [phone]

## 2023-01-16 NOTE — ED Triage Notes (Signed)
Pt states on Thursday she bent over to pick something up and shortly after had a pain in the left abd area. States she felt a knot in that area. States the next day the pain radiated into her left mid back area. Has been taking advil TID with no relief. States standing helps the pain. Denies any urinary symptoms. States hx of kidney stone but states this does not feel like that type of pain. C/o nausea. States she feels like she is not able to take a deep breath due to the pain.

## 2023-01-30 DIAGNOSIS — M0579 Rheumatoid arthritis with rheumatoid factor of multiple sites without organ or systems involvement: Secondary | ICD-10-CM | POA: Diagnosis not present

## 2023-02-01 DIAGNOSIS — M0579 Rheumatoid arthritis with rheumatoid factor of multiple sites without organ or systems involvement: Secondary | ICD-10-CM | POA: Diagnosis not present

## 2023-02-02 DIAGNOSIS — H90A31 Mixed conductive and sensorineural hearing loss, unilateral, right ear with restricted hearing on the contralateral side: Secondary | ICD-10-CM | POA: Diagnosis not present

## 2023-02-02 DIAGNOSIS — H7291 Unspecified perforation of tympanic membrane, right ear: Secondary | ICD-10-CM | POA: Diagnosis not present

## 2023-02-02 DIAGNOSIS — M069 Rheumatoid arthritis, unspecified: Secondary | ICD-10-CM | POA: Diagnosis not present

## 2023-02-15 ENCOUNTER — Encounter (HOSPITAL_BASED_OUTPATIENT_CLINIC_OR_DEPARTMENT_OTHER): Payer: Self-pay | Admitting: Otolaryngology

## 2023-02-15 NOTE — Progress Notes (Signed)
Reviewed pulmonology notes with Dr. Renold Don at [facility]. He would like patients pulmonologist to clear her and make any necessary recommendations for patient having out patient surgery. LVM with carla to request document. Also asked carla about patients ASA therapy if Dr. Pollyann Kennedy wants patient to stop therapy before surgery or not.

## 2023-02-16 ENCOUNTER — Encounter (HOSPITAL_BASED_OUTPATIENT_CLINIC_OR_DEPARTMENT_OTHER)
Admission: RE | Admit: 2023-02-16 | Discharge: 2023-02-16 | Disposition: A | Discharge status: Home / Self Care | Payer: Medicare Other | Source: Ambulatory Visit | Attending: Otolaryngology | Admitting: Otolaryngology

## 2023-02-16 DIAGNOSIS — Z01812 Encounter for preprocedural laboratory examination: Secondary | ICD-10-CM | POA: Diagnosis not present

## 2023-02-16 DIAGNOSIS — Z01818 Encounter for other preprocedural examination: Secondary | ICD-10-CM | POA: Diagnosis present

## 2023-02-16 LAB — BASIC METABOLIC PANEL
Anion gap: 8 (ref 5–15)
BUN: 18 mg/dL (ref 8–23)
CO2: 29 mmol/L (ref 22–32)
Calcium: 9.5 mg/dL (ref 8.9–10.3)
Chloride: 102 mmol/L (ref 98–111)
Creatinine, Ser: 0.9 mg/dL (ref 0.44–1.00)
GFR, Estimated: >60 mL/min (ref >60)
Glucose, Bld: 94 mg/dL (ref 70–99)
Potassium: 4.1 mmol/L (ref 3.5–5.1)
Sodium: 139 mmol/L (ref 135–145)

## 2023-02-21 ENCOUNTER — Telehealth: Payer: Self-pay | Admitting: Internal Medicine

## 2023-02-21 ENCOUNTER — Telehealth: Payer: Self-pay | Admitting: Cardiovascular Disease

## 2023-02-21 NOTE — Telephone Encounter (Signed)
Appt scheduled for tomorrow with Brandy Townsend for risk assessment  Nothing further needed

## 2023-02-21 NOTE — Telephone Encounter (Signed)
   Name: Brandy Townsend  DOB: Jul 27, 1950  MRN: [id number]  Primary Cardiologist: Charlton Haws, MD  Chart reviewed as part of pre-operative protocol coverage. Because of [name]'s past medical history and time since last visit, she will require a follow-up telephone visit in order to better assess preoperative cardiovascular risk.  Pre-op covering staff: - Please schedule appointment and call patient to inform them. If patient already had an upcoming appointment within acceptable timeframe, please add "pre-op clearance" to the appointment notes so provider is aware. - Please contact requesting surgeon's office via preferred method (i.e, phone, fax) to inform them of need for appointment prior to surgery.  If asymptomatic at the time of phone call, can hold aspirin for 5 to 7 days prior to surgery and resume when medically safe to do so.  Sharlene Dory, PA-C  02/21/2023, 4:18 PM

## 2023-02-21 NOTE — Telephone Encounter (Signed)
I will confirm with pre op APP if surgery is cancelled, do we still need tele appt? I called the requesting office and confirmed the pt's surgery is still on, though procedure is now 02/27/23 with Dr. Pollyann Kennedy. I explained that pt will need a tele appt and need to hold ASA 5-7 days is our recommendations. I informed Denise with Dr. Pollyann Kennedy office to see where we need to add the pt.

## 2023-02-21 NOTE — Telephone Encounter (Signed)
If her breahing is stable without flare up ok for tympanoplasty. Like to know how many hyours surgery and what type of anesthesia

## 2023-02-21 NOTE — Telephone Encounter (Signed)
I unfortunately can't clear her for surgery. Based on his note she was having some acute symptoms, her maintenance inhaler was changed and FENO was elevated. At minimum she would be an intermediate risk for surgery due to asthma

## 2023-02-21 NOTE — Telephone Encounter (Signed)
Fax received from Dr. Pollyann Kennedy with [facility] to perform a Tympanoplasty on patient. Patient needs surgery clearance. Surgery is 02/27/23. Patient was seen on 01/13/23. Office protocol is a risk assessment can be sent to surgeon if patient has been seen in 60 days or less.   Beth- MR unavailable. Are you able to addend LOV or do a risk assessment based on that visit?   If they do not receive clearance by this afternoon the procedure will be cancelled

## 2023-02-21 NOTE — Telephone Encounter (Signed)
   Pre-operative Risk Assessment    Patient Name: Brandy Townsend  DOB: 06-26-1950 MRN: [id number]      Request for Surgical Clearance    Procedure:   tympanoplasty  Date of Surgery:  Clearance 02/28/23                                 Surgeon:  Dr. Loran Senters Group or Practice Name:  [facility] Phone number:  [phone] Fax number:  [phone]   Type of Clearance Requested:   - Medical  - Pharmacy:  Hold Aspirin - up to cardiology   Type of Anesthesia:  General    Additional requests/questions:    SignedGarald Braver   02/21/2023, 10:01 AM

## 2023-02-22 ENCOUNTER — Ambulatory Visit (INDEPENDENT_AMBULATORY_CARE_PROVIDER_SITE_OTHER): Payer: Medicare Other | Admitting: Nurse Practitioner

## 2023-02-22 ENCOUNTER — Ambulatory Visit: Payer: Medicare Other

## 2023-02-22 ENCOUNTER — Encounter: Payer: Self-pay | Admitting: Nurse Practitioner

## 2023-02-22 ENCOUNTER — Telehealth (INDEPENDENT_AMBULATORY_CARE_PROVIDER_SITE_OTHER): Payer: Medicare Other

## 2023-02-22 ENCOUNTER — Telehealth: Payer: Self-pay

## 2023-02-22 VITALS — BP 100/64 | HR 77 | Ht 63.5 in | Wt 151.0 lb

## 2023-02-22 DIAGNOSIS — J4540 Moderate persistent asthma, uncomplicated: Secondary | ICD-10-CM

## 2023-02-22 DIAGNOSIS — Z01811 Encounter for preprocedural respiratory examination: Secondary | ICD-10-CM

## 2023-02-22 DIAGNOSIS — Z01810 Encounter for preprocedural cardiovascular examination: Secondary | ICD-10-CM | POA: Diagnosis not present

## 2023-02-22 DIAGNOSIS — R059 Cough, unspecified: Secondary | ICD-10-CM

## 2023-02-22 DIAGNOSIS — M5134 Other intervertebral disc degeneration, thoracic region: Secondary | ICD-10-CM | POA: Diagnosis not present

## 2023-02-22 LAB — POCT EXHALED NITRIC OXIDE: FeNO level (ppb): 50

## 2023-02-22 NOTE — Patient Instructions (Signed)
Continue Albuterol inhaler 2 puffs every 6 hours as needed for shortness of breath or wheezing. Notify if symptoms persist despite rescue inhaler/neb use.  Continue Breztri 2 puffs Twice daily. Brush tongue and rinse mouth afterwards Continue Zyrtec 1 tab daily  Continue singulair 1 tab At bedtime   Take your inhaler the day of surgery. Take your albuterol with you to your procedure. Use incentive spirometer 10 times an hour during the recovery period. Up and moving as soon as possible after surgery.   Follow up as scheduled with Dr. Marchelle Gearing. If symptoms worsen, please contact office for sooner follow up or seek emergency care.

## 2023-02-22 NOTE — Telephone Encounter (Signed)
Yes, will need telephone visit prior to surgery.  Thank you.  Thomasene Ripple. Mattilyn Crites NP-C     02/22/2023, 8:16 AM [facility] [address] Office [phone] Fax [phone]

## 2023-02-22 NOTE — Telephone Encounter (Signed)
Patient agreeable with telehealth appt. Med list received and consent has been given.

## 2023-02-22 NOTE — Progress Notes (Addendum)
Virtual Visit via Telephone Note   Because of Brandy Townsend's co-morbid illnesses, she is at least at moderate risk for complications without adequate follow up.  This format is felt to be most appropriate for this patient at this time.  The patient did not have access to video technology/had technical difficulties with video requiring transitioning to audio format only (telephone).  All issues noted in this document were discussed and addressed.  No physical exam could be performed with this format.  Please refer to the patient's chart for her consent to telehealth for Brandy Townsend.  Evaluation Performed:  Preoperative cardiovascular risk assessment _____________   Date:  02/22/2023   Patient ID:  Brandy Townsend, DOB 23-Nov-1950, MRN [id number] Patient Location:  Home Provider location:   Office  Primary Care Provider:  Adrian Prince, MD Primary Cardiologist:  Charlton Haws, MD  Chief Complaint / Patient Profile   72 y.o. y/o female with a h/o coronary artery disease, hyperlipidemia, palpitations who is pending tympanoplasty and presents today for telephonic preoperative cardiovascular risk assessment.  History of Present Illness    Brandy Townsend is a 72 y.o. female who presents via audio/video conferencing for a telehealth visit today.  Pt was last seen in cardiology clinic on 06/30/2022 by Dr. Eden Emms.  At that time Brandy Townsend was doing well .  The patient is now pending procedure as outlined above. Since her last visit, she remained stable from a cardiac standpoint.  Today she denies  shortness of breath, lower extremity edema, fatigue, palpitations, melena, hematuria, hemoptysis, diaphoresis, weakness, presyncope, syncope, orthopnea, and PND.   Past Medical History    Past Medical History:  Diagnosis Date   Asthma    CAD (coronary artery disease)    Diverticulosis    Graves disease    s/p RAI   High cholesterol    Hypertension    Hypothyroidism     Nephrolithiasis    Osteoporosis    Rheumatoid arthritis (HCC)    Vertigo    Past Surgical History:  Procedure Laterality Date   BRONCHIAL WASHINGS  09/12/2019   Procedure: BRONCHIAL WASHINGS;  Surgeon: Kalman Shan, MD;  Location: [facility];  Service: Endoscopy;;   CORONARY PRESSURE/FFR STUDY N/A 01/08/2020   Procedure: INTRAVASCULAR PRESSURE WIRE/FFR STUDY;  Surgeon: Swaziland, Peter M, MD;  Location: [facility] INVASIVE CV LAB;  Service: Cardiovascular;  Laterality: N/A;   LEFT HEART CATH AND CORONARY ANGIOGRAPHY N/A 01/08/2020   Procedure: LEFT HEART CATH AND CORONARY ANGIOGRAPHY;  Surgeon: Swaziland, Peter M, MD;  Location: [facility] INVASIVE CV LAB;  Service: Cardiovascular;  Laterality: N/A;   VIDEO BRONCHOSCOPY N/A 09/12/2019   Procedure: VIDEO BRONCHOSCOPY WITHOUT FLUORO;  Surgeon: Kalman Shan, MD;  Location: [facility];  Service: Endoscopy;  Laterality: N/A;    Allergies  Allergies  Allergen Reactions   Cat Hair Extract     Other reaction(s): congestion   Dust Mite Extract     Other reaction(s): Unknown   Morphine And Codeine Nausea And Vomiting   Macrobid [Nitrofurantoin Monohyd Macro] Other (See Comments)    Lowered potassium, caused aches and pains    Home Medications    Prior to Admission medications   Medication Sig Start Date End Date Taking? Authorizing Provider  abatacept (ORENCIA) 250 MG injection Inject 250 mg into the vein See admin instructions. Inject 250 mg IV once every month.    [provider]  albuterol (VENTOLIN HFA) 108 (90 Base) MCG/ACT inhaler Inhale 1-2 puffs into the  lungs every 6 (six) hours as needed for wheezing or shortness of breath. 12/02/21   Parrett, Virgel Bouquet, NP  aspirin EC 81 MG tablet Take 1 tablet (81 mg total) by mouth daily. Swallow whole. 12/17/19   Rosalio Macadamia, NP  Budeson-Glycopyrrol-Formoterol (BREZTRI AEROSPHERE) 160-9-4.8 MCG/ACT AERO Inhale 2 puffs into the lungs in the morning and at bedtime. 01/13/23   Kalman Shan,  MD  cetirizine (ZYRTEC) 10 MG tablet Take 10 mg by mouth at bedtime as needed for allergies.     [provider]  diclofenac Sodium (VOLTAREN) 1 % GEL Apply 4 g topically 4 (four) times daily. 11/12/22   Palumbo, April, MD  KLOR-CON 10 10 MEQ tablet Take 10 mEq by mouth daily. 08/11/14   [provider]  levothyroxine (SYNTHROID) 50 MCG tablet Take 50 mcg by mouth daily.  08/16/18   [provider]  montelukast (SINGULAIR) 10 MG tablet Take 10 mg by mouth daily. 11/17/20   [provider]  Multiple Vitamin (MULTIVITAMIN) tablet Take 1 tablet by mouth daily.    [provider]  Multiple Vitamins-Minerals (EQ MULTIVITAMINS ADULT GUMMY PO) Take 1 each by mouth daily.    [provider]  nitroGLYCERIN (NITROSTAT) 0.4 MG SL tablet Place 1 tablet (0.4 mg total) under the tongue every 5 (five) minutes as needed for chest pain. 05/27/20   Wendall Stade, MD  Probiotic Product (PROBIOTIC GUMMIES PO) Take by mouth daily as needed.    [provider]  rosuvastatin (CRESTOR) 10 MG tablet TAKE 1 TABLET BY MOUTH EVERY DAY 10/04/22   Wendall Stade, MD  torsemide (DEMADEX) 20 MG tablet Take 1 tablet (20 mg total) by mouth daily. 05/15/19   Rosalio Macadamia, NP  Turmeric (QC TUMERIC COMPLEX PO) Take by mouth daily.    [provider]  verapamil (CALAN-SR) 240 MG CR tablet TAKE 1 TABLET BY MOUTH EVERY DAY 01/05/23   Wendall Stade, MD  Vitamin D, Ergocalciferol, (DRISDOL) 1.25 MG (50000 UNIT) CAPS capsule Take 50,000 Units by mouth every 7 (seven) days. Monday    [provider]    Physical Exam    Vital Signs:  Brandy Townsend does not have vital signs available for review today.  Given telephonic nature of communication, physical exam is limited. AAOx3. NAD. Normal affect.  Speech and respirations are unlabored.  Accessory Clinical Findings    None  Assessment & Plan    1.  Preoperative Cardiovascular Risk Assessment:   tympanoplasty, Dr. Pollyann Kennedy ,  [facility], Fax number:  [phone]       Primary Cardiologist: Charlton Haws, MD  Chart reviewed as part of pre-operative protocol coverage. Given past medical history and time since last visit, based on ACC/AHA guidelines, Brandy Townsend would be at acceptable risk for the planned procedure without further cardiovascular Townsend.   Her aspirin may be held for 5 to 7 days prior to her procedure.  Please resume as soon as hemostasis is achieved.  Patient was advised that if she develops new symptoms prior to surgery to contact our office to arrange a follow-up appointment.  He verbalized understanding.  I will route this recommendation to the requesting party via Epic fax function and remove from pre-op pool.  Please call with questions.  Brandy Townsend. Brandy Oconnor NP-C     02/22/2023, 12:14 PM [facility] [address] Office [phone] Fax [phone]   I spent greater than 7 minutes during the  telephone encounter for evaluation of diagnosis, medication, management & plan.   Prior to patient's phone evaluation I spent greater than 10 minutes reviewing their past medical history and cardiac medications.

## 2023-02-22 NOTE — Progress Notes (Signed)
@Patient  ID: Brandy Townsend, female    DOB: 06/10/1950, 72 y.o.   MRN: [id number]  Chief Complaint  Patient presents with   Follow-up    Surgery Clearance.    Referring provider: Adrian Prince, MD  HPI: 72 year old female, former smoker followed for asthma. She is a patient of Dr. Jane Canary and last seen in office 01/13/2023.  Past medical history significant for hypertension, CAD, allergic rhinitis, hypothyroid, mixed conductive and sensorineural hearing loss.  Chronic perforation of right tympanic membrane, RA on Orencia, anxiety.  TEST/EVENTS:  01/23/2020 PFT: FVC 69, FEV1 66, ratio 75, TLC 109, DLCOcor 96.  Significant bronchodilator response (19% change) 12/18/2022 CTA chest: No evidence of PE.  Asymmetrically enlarged right thyroid gland, previously biopsied.  Mild diffuse bronchial wall thickening.  Increased biapical tree-in-bud nodules [not appreciated by Dr. Marchelle Gearing 01/13/2023 FeNO 66 ppb 01/16/2023 eos: 400   01/13/2023: OV with Dr. Marchelle Gearing.  Last seen spring 2024.  Overall was stable until 12/18/2022.  She went to the emergency department with multiple acute complaints.  Diagnosed with acute bronchitis based on CT scan findings.  PE ruled out.  Right upper lobe tree-in-bud infiltrates noted on the read but not appreciated when visualized.  Treated with antibiotic and feels back to baseline.  ACT score is good.  Nitric oxide testing is actually elevated from her baseline of 50 to greater than 60 today.  Surprised by this.  Interested in therapeutic options and is worried her asthma would be getting out of control.  Review of labs indicates she has always had high elevated baseline eosinophils.  Skin allergy test with Dr. Vanderbilt Callas was positive some years ago.  Of note, she has been on Orencia which is known to cause respiratory exacerbations particularly in COPD patients.  Did have a recent infusion of that.  Stepped up to [facility].  Depending on course, may need to discontinue  Orencia (if having multiple respiratory exacerbations.  Could also consider biologic therapy.  Repeat CT of the chest in 1 year.  02/22/2023: Today-surgical risk assessment Patient presents today for preoperative evaluation.  She is scheduled to have a tympanoplasty with Dr. Pollyann Kennedy for perforated eardrum, causing hearing loss.  She feels like her breathing has been better since she started on the Naomi.  She does feel like this is helping her.  Sure she did not feel that bad at the timing of starting it but has noticed a difference with it.  No significant cough, chest congestion or wheezing.  Allergies are at her baseline.  Feels like she is well-controlled today and wants to move forward with surgery.  FeNO 50   Allergies  Allergen Reactions   Cat Hair Extract     Other reaction(s): congestion   Dust Mite Extract     Other reaction(s): Unknown   Morphine And Codeine Nausea And Vomiting   Macrobid [Nitrofurantoin Monohyd Macro] Other (See Comments)    Lowered potassium, caused aches and pains    Immunization History  Administered Date(s) Administered   Fluad Quad(high Dose 65+) 01/20/2020, 12/31/2021   Fluad Trivalent(High Dose 65+) 01/09/2023   Influenza Whole 01/04/2017   Influenza, High Dose Seasonal PF 01/03/2017, 12/27/2018, 03/03/2021   Influenza, Quadrivalent, Recombinant, Inj, Pf 01/09/2018, 01/11/2019   Influenza,inj,quad, With Preservative 12/26/2016   Influenza-Unspecified 12/26/2013, 12/27/2014, 12/27/2015   PFIZER(Purple Top)SARS-COV-2 Vaccination 04/17/2019, 05/06/2019, 01/31/2020   Pfizer Covid-19 Vaccine Bivalent Booster 70yrs & up 01/22/2021   Pneumococcal Conjugate-13 04/28/2014, 05/26/2014   Pneumococcal Polysaccharide-23 03/03/2021  Respiratory Syncytial Virus Vaccine,Recomb Aduvanted(Arexvy) 03/10/2022   Zoster Recombinant(Shingrix) 01/14/2019, 04/02/2019   Zoster, Live 05/22/2013, 04/01/2019    Past Medical History:  Diagnosis Date   Asthma    CAD  (coronary artery disease)    Diverticulosis    Graves disease    s/p RAI   High cholesterol    Hypertension    Hypothyroidism    Nephrolithiasis    Osteoporosis    Rheumatoid arthritis (HCC)    Vertigo     Tobacco History: Social History   Tobacco Use  Smoking Status Former   Current packs/day: 0.00   Average packs/day: 1 pack/day for 20.0 years (20.0 ttl pk-yrs)   Types: Cigarettes   Start date: 08/26/1988   Quit date: 08/26/2008   Years since quitting: 14.5  Smokeless Tobacco Never  Tobacco Comments   social smoker   Counseling given: Not Answered Tobacco comments: social smoker   Outpatient Medications Prior to Visit  Medication Sig Dispense Refill   abatacept (ORENCIA) 250 MG injection Inject 250 mg into the vein See admin instructions. Inject 250 mg IV once every month.     albuterol (VENTOLIN HFA) 108 (90 Base) MCG/ACT inhaler Inhale 1-2 puffs into the lungs every 6 (six) hours as needed for wheezing or shortness of breath. 8 g 5   aspirin EC 81 MG tablet Take 1 tablet (81 mg total) by mouth daily. Swallow whole. 90 tablet 3   Budeson-Glycopyrrol-Formoterol (BREZTRI AEROSPHERE) 160-9-4.8 MCG/ACT AERO Inhale 2 puffs into the lungs in the morning and at bedtime.     cetirizine (ZYRTEC) 10 MG tablet Take 10 mg by mouth at bedtime as needed for allergies.      diclofenac Sodium (VOLTAREN) 1 % GEL Apply 4 g topically 4 (four) times daily. 100 g 0   KLOR-CON 10 10 MEQ tablet Take 10 mEq by mouth daily.  0   levothyroxine (SYNTHROID) 50 MCG tablet Take 50 mcg by mouth daily.      montelukast (SINGULAIR) 10 MG tablet Take 10 mg by mouth daily.     Multiple Vitamin (MULTIVITAMIN) tablet Take 1 tablet by mouth daily.     nitroGLYCERIN (NITROSTAT) 0.4 MG SL tablet Place 1 tablet (0.4 mg total) under the tongue every 5 (five) minutes as needed for chest pain. 25 tablet 3   Probiotic Product (PROBIOTIC GUMMIES PO) Take by mouth daily as needed.     rosuvastatin (CRESTOR) 10 MG  tablet TAKE 1 TABLET BY MOUTH EVERY DAY 90 tablet 2   torsemide (DEMADEX) 20 MG tablet Take 1 tablet (20 mg total) by mouth daily. 90 tablet 3   Turmeric (QC TUMERIC COMPLEX PO) Take by mouth daily.     verapamil (CALAN-SR) 240 MG CR tablet TAKE 1 TABLET BY MOUTH EVERY DAY 90 tablet 2   Vitamin D, Ergocalciferol, (DRISDOL) 1.25 MG (50000 UNIT) CAPS capsule Take 50,000 Units by mouth every 7 (seven) days. Monday     No facility-administered medications prior to visit.     Review of Systems:   Constitutional: No weight loss or gain, night sweats, fevers, chills, fatigue, or lassitude. HEENT: No headaches, difficulty swallowing, tooth/dental problems, or sore throat. No sneezing, itching, ear ache. +chronic hearing loss, nasal congestion (baseline) CV:  No chest pain, orthopnea, PND, swelling in lower extremities, anasarca, dizziness, palpitations, syncope Resp: No shortness of breath with exertion or at rest. No excess mucus or change in color of mucus. No productive or non-productive. No hemoptysis. No wheezing.  No chest wall  deformity GI:  No heartburn, indigestion GU: No dysuria, change in color of urine, urgency or frequency.   Skin: No rash, lesions, ulcerations MSK:  No joint pain or swelling.   Neuro: No dizziness or lightheadedness.  Psych: No depression or anxiety. Mood stable.     Physical Exam:  BP 100/64 (BP Location: Right Arm, Cuff Size: Normal)   Pulse 77   Ht 5' 3.5" (1.613 m)   Wt 151 lb (68.5 kg)   SpO2 97%   BMI 26.33 kg/m   GEN: Pleasant, interactive, well-appearing; in no acute distress HEENT:  Normocephalic and atraumatic. PERRLA. Sclera white. Nasal turbinates pink, moist and patent bilaterally. No rhinorrhea present. Oropharynx pink and moist, without exudate or edema. No lesions, ulcerations, or postnasal drip.  NECK:  Supple w/ fair ROM. No JVD present. Normal carotid impulses w/o bruits. Thyroid symmetrical with no goiter or nodules palpated. No  lymphadenopathy.   CV: RRR, no m/r/g, no peripheral edema. Pulses intact, +2 bilaterally. No cyanosis, pallor or clubbing. PULMONARY:  Unlabored, regular breathing. Clear bilaterally A&P w/o wheezes/rales/rhonchi. No accessory muscle use.  GI: BS present and normoactive. Soft, non-tender to palpation. No organomegaly or masses detected.  MSK: No erythema, warmth or tenderness. Cap refil <2 sec all extrem. No deformities or joint swelling noted.  Neuro: A/Ox3. No focal deficits noted.   Skin: Warm, no lesions or rashe Psych: Normal affect and behavior. Judgement and thought content appropriate.     Lab Results:  CBC    Component Value Date/Time   WBC 8.4 01/16/2023 0619   RBC 4.52 01/16/2023 0619   HGB 13.1 01/16/2023 0619   HCT 40.5 01/16/2023 0619   PLT 312 01/16/2023 0619   MCV 89.6 01/16/2023 0619   MCH 29.0 01/16/2023 0619   MCHC 32.3 01/16/2023 0619   RDW 14.4 01/16/2023 0619   LYMPHSABS 2.4 01/16/2023 0619   MONOABS 0.8 01/16/2023 0619   EOSABS 0.4 01/16/2023 0619   BASOSABS 0.1 01/16/2023 0619    BMET    Component Value Date/Time   NA 139 02/16/2023 1350   NA 141 05/13/2019 1626   K 4.1 02/16/2023 1350   CL 102 02/16/2023 1350   CO2 29 02/16/2023 1350   GLUCOSE 94 02/16/2023 1350   BUN 18 02/16/2023 1350   BUN 26 05/13/2019 1626   CREATININE 0.90 02/16/2023 1350   CALCIUM 9.5 02/16/2023 1350   GFRNONAA >60 02/16/2023 1350   GFRAA >60 07/27/2019 1105    BNP    Component Value Date/Time   BNP 28.3 02/01/2017 2032     Imaging:  No results found.  Administration History     None          Latest Ref Rng & Units 01/23/2020    8:43 AM 01/13/2017    8:40 AM  PFT Results  FVC-Pre L 2.08  2.43   FVC-Predicted Pre % 69  79   FVC-Post L 2.41  2.46   FVC-Predicted Post % 80  80   Pre FEV1/FVC % % 72  73   Post FEV1/FCV % % 75  76   FEV1-Pre L 1.51  1.77   FEV1-Predicted Pre % 66  75   FEV1-Post L 1.81  1.87   DLCO uncorrected ml/min/mmHg 18.49   16.68   DLCO UNC% % 97  72   DLCO corrected ml/min/mmHg 18.38  17.68   DLCO COR %Predicted % 96  77   DLVA Predicted % 108  83   TLC L 5.38  5.90   TLC % Predicted % 109  120   RV % Predicted % 142  162     Lab Results  Component Value Date   NITRICOXIDE 66 01/13/2023        Assessment & Plan:   Moderate asthma without complication Well-compensated on current regimen. No recurrent exacerbations following episode in September. She does have clinical benefit from Jacinto. FeNO is back to her baseline of 50 ppb, which is still elevated but seems to be normal for her. No acute symptoms. Action plan in place.  Patient Instructions  Continue Albuterol inhaler 2 puffs every 6 hours as needed for shortness of breath or wheezing. Notify if symptoms persist despite rescue inhaler/neb use.  Continue Breztri 2 puffs Twice daily. Brush tongue and rinse mouth afterwards Continue Zyrtec 1 tab daily  Continue singulair 1 tab At bedtime   Take your inhaler the day of surgery. Take your albuterol with you to your procedure. Use incentive spirometer 10 times an hour during the recovery period. Up and moving as soon as possible after surgery.   Follow up as scheduled with Dr. Marchelle Gearing. If symptoms worsen, please contact office for sooner follow up or seek emergency care.    Preoperative respiratory examination Moderate risk. Factors that increase the risk for postoperative pulmonary complications are asthma, age, RA on chronic immunosuppression. Preoperative CXR today.  Respiratory complications generally occur in 1% of ASA Class I patients, 5% of ASA Class II and 10% of ASA Class III-IV patients These complications rarely result in mortality and include postoperative pneumonia, atelectasis, pulmonary embolism, ARDS and increased time requiring postoperative mechanical ventilation.   Overall, I recommend proceeding with the surgery if the risk for respiratory complications are outweighed by the  potential benefits. This will need to be discussed between the patient and surgeon.   To reduce risks of respiratory complications, I recommend: --Pre- and post-operative incentive spirometry performed frequently while awake --Inpatient use of currently prescribed bronchodilators  --Short duration of surgery as much as possible and avoid paralytic if possible --OOB, encourage mobility post-op   1) RISK FOR PROLONGED MECHANICAL VENTILAION - > 48h  1A) Arozullah - Prolonged mech ventilation risk Arozullah Postperative Pulmonary Risk Score - for mech ventilation dependence >48h USAA, Ann Surg 2000, major non-cardiac surgery) Comment Score  Type of surgery - abd ao aneurysm (27), thoracic (21), neurosurgery / upper abdominal / vascular (21), neck (11) Tympanoplasty  5  Emergency Surgery - (11)  0  ALbumin < 3 or poor nutritional state - (9)  0  BUN > 30 -  (8)  0  Partial or completely dependent functional status - (7)  0  COPD -  (6) asthma 6  Age - 60 to 69 (4), > 70  (6) 70 6  TOTAL  17  Risk Stratifcation scores  - < 10 (0.5%), 11-19 (1.8%), 20-27 (4.2%), 28-40 (10.1%), >40 (26.6%)  1.8%     Advised if symptoms do not improve or worsen, to please contact office for sooner follow up or seek emergency care.   I spent 32 minutes of dedicated to the care of this patient on the date of this encounter to include pre-visit review of records, face-to-face time with the patient discussing conditions above, post visit ordering of testing, clinical documentation with the electronic health record, making appropriate referrals as documented, and communicating necessary findings to members of the patients care team.  Noemi Chapel, NP 02/22/2023  Pt aware and understands NP's role.

## 2023-02-22 NOTE — Telephone Encounter (Signed)
Hey do you want me to squeeze this patient on today since her procedure is scheduled for Monday? If so where should I put her?

## 2023-02-22 NOTE — Assessment & Plan Note (Signed)
Well-compensated on current regimen. No recurrent exacerbations following episode in September. She does have clinical benefit from Callender. FeNO is back to her baseline of 50 ppb, which is still elevated but seems to be normal for her. No acute symptoms. Action plan in place.  Patient Instructions  Continue Albuterol inhaler 2 puffs every 6 hours as needed for shortness of breath or wheezing. Notify if symptoms persist despite rescue inhaler/neb use.  Continue Breztri 2 puffs Twice daily. Brush tongue and rinse mouth afterwards Continue Zyrtec 1 tab daily  Continue singulair 1 tab At bedtime   Take your inhaler the day of surgery. Take your albuterol with you to your procedure. Use incentive spirometer 10 times an hour during the recovery period. Up and moving as soon as possible after surgery.   Follow up as scheduled with Dr. Marchelle Gearing. If symptoms worsen, please contact office for sooner follow up or seek emergency care.

## 2023-02-22 NOTE — Assessment & Plan Note (Addendum)
Moderate risk. Factors that increase the risk for postoperative pulmonary complications are asthma, age, RA on chronic immunosuppression. Preoperative CXR today.  Respiratory complications generally occur in 1% of ASA Class I patients, 5% of ASA Class II and 10% of ASA Class III-IV patients These complications rarely result in mortality and include postoperative pneumonia, atelectasis, pulmonary embolism, ARDS and increased time requiring postoperative mechanical ventilation.   Overall, I recommend proceeding with the surgery if the risk for respiratory complications are outweighed by the potential benefits. This will need to be discussed between the patient and surgeon.   To reduce risks of respiratory complications, I recommend: --Pre- and post-operative incentive spirometry performed frequently while awake --Inpatient use of currently prescribed bronchodilators  --Short duration of surgery as much as possible and avoid paralytic if possible --OOB, encourage mobility post-op   1) RISK FOR PROLONGED MECHANICAL VENTILAION - > 48h  1A) Arozullah - Prolonged mech ventilation risk Arozullah Postperative Pulmonary Risk Score - for mech ventilation dependence >48h USAA, Ann Surg 2000, major non-cardiac surgery) Comment Score  Type of surgery - abd ao aneurysm (27), thoracic (21), neurosurgery / upper abdominal / vascular (21), neck (11) Tympanoplasty  5  Emergency Surgery - (11)  0  ALbumin < 3 or poor nutritional state - (9)  0  BUN > 30 -  (8)  0  Partial or completely dependent functional status - (7)  0  COPD -  (6) asthma 6  Age - 60 to 69 (4), > 70  (6) 70 6  TOTAL  17  Risk Stratifcation scores  - < 10 (0.5%), 11-19 (1.8%), 20-27 (4.2%), 28-40 (10.1%), >40 (26.6%)  1.8%

## 2023-02-22 NOTE — Telephone Encounter (Signed)
Preoperative team, today we do not have any open telephone appointments on the schedule.  Patient will need to be added on to scheduled towards the end of the day.  I will try to call her sooner if I am able.  Thank you for your help.  Thomasene Ripple. Jerett Odonohue NP-C     02/22/2023, 10:55 AM [facility] [address] Office [phone] Fax [phone]

## 2023-02-22 NOTE — Telephone Encounter (Signed)
  Patient Consent for Virtual Visit         Brandy Townsend has provided verbal consent on 02/22/2023 for a virtual visit (video or telephone).   CONSENT FOR VIRTUAL VISIT FOR:  Brandy Townsend  By participating in this virtual visit I agree to the following:  I hereby voluntarily request, consent and authorize [facility] and its employed or contracted physicians, physician assistants, nurse practitioners or other licensed health care professionals (the Practitioner), to provide me with telemedicine health care services (the "Services") as deemed necessary by the treating Practitioner. I acknowledge and consent to receive the Services by the Practitioner via telemedicine. I understand that the telemedicine visit will involve communicating with the Practitioner through live audiovisual communication technology and the disclosure of certain medical information by electronic transmission. I acknowledge that I have been given the opportunity to request an in-person assessment or other available alternative prior to the telemedicine visit and am voluntarily participating in the telemedicine visit.  I understand that I have the right to withhold or withdraw my consent to the use of telemedicine in the course of my care at any time, without affecting my right to future care or treatment, and that the Practitioner or I may terminate the telemedicine visit at any time. I understand that I have the right to inspect all information obtained and/or recorded in the course of the telemedicine visit and may receive copies of available information for a reasonable fee.  I understand that some of the potential risks of receiving the Services via telemedicine include:  Delay or interruption in medical evaluation due to technological equipment failure or disruption; Information transmitted may not be sufficient (e.g. poor resolution of images) to allow for appropriate medical decision making by the Practitioner;  and/or  In rare instances, security protocols could fail, causing a breach of personal health information.  Furthermore, I acknowledge that it is my responsibility to provide information about my medical history, conditions and care that is complete and accurate to the best of my ability. I acknowledge that Practitioner's advice, recommendations, and/or decision may be based on factors not within their control, such as incomplete or inaccurate data provided by me or distortions of diagnostic images or specimens that may result from electronic transmissions. I understand that the practice of medicine is not an exact science and that Practitioner makes no warranties or guarantees regarding treatment outcomes. I acknowledge that a copy of this consent can be made available to me via my patient portal [facility] MyChart), or I can request a printed copy by calling the office of [facility].    I understand that my insurance will be billed for this visit.   I have read or had this consent read to me. I understand the contents of this consent, which adequately explains the benefits and risks of the Services being provided via telemedicine.  I have been provided ample opportunity to ask questions regarding this consent and the Services and have had my questions answered to my satisfaction. I give my informed consent for the services to be provided through the use of telemedicine in my medical care

## 2023-02-27 ENCOUNTER — Other Ambulatory Visit: Payer: Self-pay

## 2023-02-27 ENCOUNTER — Encounter (HOSPITAL_BASED_OUTPATIENT_CLINIC_OR_DEPARTMENT_OTHER): Payer: Self-pay | Admitting: Otolaryngology

## 2023-02-27 ENCOUNTER — Encounter (HOSPITAL_BASED_OUTPATIENT_CLINIC_OR_DEPARTMENT_OTHER)
Admission: RE | Disposition: A | Discharge status: Home / Self Care | Payer: Self-pay | Source: Home / Self Care | Attending: Otolaryngology

## 2023-02-27 ENCOUNTER — Emergency Department (HOSPITAL_BASED_OUTPATIENT_CLINIC_OR_DEPARTMENT_OTHER)
Admission: EM | Admit: 2023-02-27 | Discharge: 2023-02-28 | Disposition: A | Discharge status: Home / Self Care | Payer: Medicare Other | Source: Home / Self Care | Attending: Emergency Medicine | Admitting: Emergency Medicine

## 2023-02-27 ENCOUNTER — Ambulatory Visit (HOSPITAL_BASED_OUTPATIENT_CLINIC_OR_DEPARTMENT_OTHER): Payer: Medicare Other | Admitting: Anesthesiology

## 2023-02-27 ENCOUNTER — Ambulatory Visit (HOSPITAL_BASED_OUTPATIENT_CLINIC_OR_DEPARTMENT_OTHER)
Admission: RE | Admit: 2023-02-27 | Discharge: 2023-02-27 | Disposition: A | Discharge status: Home / Self Care | Payer: Medicare Other | Attending: Otolaryngology | Admitting: Otolaryngology

## 2023-02-27 ENCOUNTER — Encounter (HOSPITAL_BASED_OUTPATIENT_CLINIC_OR_DEPARTMENT_OTHER): Payer: Self-pay | Admitting: Emergency Medicine

## 2023-02-27 ENCOUNTER — Emergency Department (HOSPITAL_BASED_OUTPATIENT_CLINIC_OR_DEPARTMENT_OTHER): Payer: Medicare Other

## 2023-02-27 DIAGNOSIS — H9311 Tinnitus, right ear: Secondary | ICD-10-CM | POA: Diagnosis not present

## 2023-02-27 DIAGNOSIS — R0789 Other chest pain: Secondary | ICD-10-CM | POA: Diagnosis not present

## 2023-02-27 DIAGNOSIS — H7291 Unspecified perforation of tympanic membrane, right ear: Secondary | ICD-10-CM | POA: Diagnosis not present

## 2023-02-27 DIAGNOSIS — R112 Nausea with vomiting, unspecified: Secondary | ICD-10-CM | POA: Insufficient documentation

## 2023-02-27 DIAGNOSIS — J45909 Unspecified asthma, uncomplicated: Secondary | ICD-10-CM | POA: Insufficient documentation

## 2023-02-27 DIAGNOSIS — R110 Nausea: Secondary | ICD-10-CM | POA: Diagnosis not present

## 2023-02-27 DIAGNOSIS — M069 Rheumatoid arthritis, unspecified: Secondary | ICD-10-CM | POA: Diagnosis not present

## 2023-02-27 DIAGNOSIS — E039 Hypothyroidism, unspecified: Secondary | ICD-10-CM | POA: Insufficient documentation

## 2023-02-27 DIAGNOSIS — Z79899 Other long term (current) drug therapy: Secondary | ICD-10-CM | POA: Insufficient documentation

## 2023-02-27 DIAGNOSIS — Z9889 Other specified postprocedural states: Secondary | ICD-10-CM | POA: Insufficient documentation

## 2023-02-27 DIAGNOSIS — Z01818 Encounter for other preprocedural examination: Secondary | ICD-10-CM

## 2023-02-27 DIAGNOSIS — J9811 Atelectasis: Secondary | ICD-10-CM | POA: Insufficient documentation

## 2023-02-27 DIAGNOSIS — Z87891 Personal history of nicotine dependence: Secondary | ICD-10-CM | POA: Diagnosis not present

## 2023-02-27 DIAGNOSIS — I10 Essential (primary) hypertension: Secondary | ICD-10-CM | POA: Insufficient documentation

## 2023-02-27 DIAGNOSIS — I2510 Atherosclerotic heart disease of native coronary artery without angina pectoris: Secondary | ICD-10-CM | POA: Insufficient documentation

## 2023-02-27 DIAGNOSIS — H7201 Central perforation of tympanic membrane, right ear: Secondary | ICD-10-CM | POA: Diagnosis not present

## 2023-02-27 DIAGNOSIS — H9071 Mixed conductive and sensorineural hearing loss, unilateral, right ear, with unrestricted hearing on the contralateral side: Secondary | ICD-10-CM | POA: Insufficient documentation

## 2023-02-27 DIAGNOSIS — H908 Mixed conductive and sensorineural hearing loss, unspecified: Secondary | ICD-10-CM | POA: Diagnosis not present

## 2023-02-27 DIAGNOSIS — Z7982 Long term (current) use of aspirin: Secondary | ICD-10-CM | POA: Insufficient documentation

## 2023-02-27 DIAGNOSIS — R1111 Vomiting without nausea: Secondary | ICD-10-CM | POA: Diagnosis not present

## 2023-02-27 HISTORY — PX: TYMPANOPLASTY: SHX33

## 2023-02-27 HISTORY — PX: INNER EAR SURGERY: SHX679

## 2023-02-27 SURGERY — TYMPANOPLASTY
Anesthesia: General | Site: Ear | Laterality: Right

## 2023-02-27 MED ORDER — PROPOFOL 10 MG/ML IV BOLUS
INTRAVENOUS | Status: DC | PRN
  Administered 2023-02-27: 130 mg via INTRAVENOUS

## 2023-02-27 MED ORDER — 0.9 % SODIUM CHLORIDE (POUR BTL) OPTIME
TOPICAL | Status: DC | PRN
Start: 2023-02-27 — End: 2023-02-27
  Administered 2023-02-27: 400 mL

## 2023-02-27 MED ORDER — FENTANYL CITRATE (PF) 100 MCG/2ML IJ SOLN
INTRAMUSCULAR | Status: AC
  Filled 2023-02-27: qty 2

## 2023-02-27 MED ORDER — ROCURONIUM BROMIDE 10 MG/ML (PF) SYRINGE
PREFILLED_SYRINGE | INTRAVENOUS | Status: AC
  Filled 2023-02-27: qty 10

## 2023-02-27 MED ORDER — FENTANYL CITRATE (PF) 100 MCG/2ML IJ SOLN
25.0000 ug | INTRAMUSCULAR | Status: DC | PRN
  Administered 2023-02-27 (×2): 50 ug via INTRAVENOUS

## 2023-02-27 MED ORDER — ROCURONIUM BROMIDE 100 MG/10ML IV SOLN
INTRAVENOUS | Status: DC | PRN
  Administered 2023-02-27: 50 mg via INTRAVENOUS

## 2023-02-27 MED ORDER — ONDANSETRON HCL 4 MG/2ML IJ SOLN
4.0000 mg | Freq: Once | INTRAMUSCULAR | Status: AC
Start: 2023-02-27 — End: 2023-02-27
  Administered 2023-02-27: 4 mg via INTRAVENOUS
  Filled 2023-02-27: qty 2

## 2023-02-27 MED ORDER — ONDANSETRON HCL 4 MG/2ML IJ SOLN
4.0000 mg | Freq: Once | INTRAMUSCULAR | Status: AC | PRN
  Administered 2023-02-27: 4 mg via INTRAVENOUS

## 2023-02-27 MED ORDER — IPRATROPIUM-ALBUTEROL 0.5-2.5 (3) MG/3ML IN SOLN
3.0000 mL | Freq: Once | RESPIRATORY_TRACT | Status: AC
  Administered 2023-02-27: 3 mL via RESPIRATORY_TRACT
  Filled 2023-02-27: qty 3

## 2023-02-27 MED ORDER — BACITRACIN ZINC 500 UNIT/GM EX OINT
TOPICAL_OINTMENT | CUTANEOUS | Status: DC | PRN
  Administered 2023-02-27: 1 via TOPICAL

## 2023-02-27 MED ORDER — ONDANSETRON HCL 4 MG/2ML IJ SOLN
INTRAMUSCULAR | Status: DC | PRN
  Administered 2023-02-27: 4 mg via INTRAVENOUS

## 2023-02-27 MED ORDER — FENTANYL CITRATE (PF) 100 MCG/2ML IJ SOLN
INTRAMUSCULAR | Status: DC | PRN
  Administered 2023-02-27: 50 ug via INTRAVENOUS

## 2023-02-27 MED ORDER — OXYCODONE HCL 5 MG PO TABS
ORAL_TABLET | ORAL | Status: AC
  Filled 2023-02-27: qty 1

## 2023-02-27 MED ORDER — METHYLENE BLUE (ANTIDOTE) 1 % IV SOLN: INTRAVENOUS | Status: DC | PRN

## 2023-02-27 MED ORDER — ONDANSETRON HCL 4 MG/2ML IJ SOLN
INTRAMUSCULAR | Status: AC
  Filled 2023-02-27: qty 2

## 2023-02-27 MED ORDER — AMISULPRIDE (ANTIEMETIC) 5 MG/2ML IV SOLN
10.0000 mg | Freq: Once | INTRAVENOUS | Status: AC | PRN
  Administered 2023-02-27: 10 mg via INTRAVENOUS

## 2023-02-27 MED ORDER — LIDOCAINE-EPINEPHRINE 1 %-1:100000 IJ SOLN
INTRAMUSCULAR | Status: DC | PRN
  Administered 2023-02-27: 2.5 mL

## 2023-02-27 MED ORDER — LACTATED RINGERS IV SOLN: INTRAVENOUS | Status: DC

## 2023-02-27 MED ORDER — LIDOCAINE 2% (20 MG/ML) 5 ML SYRINGE
INTRAMUSCULAR | Status: AC
  Filled 2023-02-27: qty 5

## 2023-02-27 MED ORDER — DEXAMETHASONE SODIUM PHOSPHATE 4 MG/ML IJ SOLN
INTRAMUSCULAR | Status: DC | PRN
  Administered 2023-02-27: 10 mg via INTRAVENOUS

## 2023-02-27 MED ORDER — PROPOFOL 10 MG/ML IV BOLUS
INTRAVENOUS | Status: AC
  Filled 2023-02-27: qty 20

## 2023-02-27 MED ORDER — SUGAMMADEX SODIUM 200 MG/2ML IV SOLN
INTRAVENOUS | Status: DC | PRN
  Administered 2023-02-27: 200 mg via INTRAVENOUS

## 2023-02-27 MED ORDER — CIPROFLOXACIN-DEXAMETHASONE 0.3-0.1 % OT SUSP
OTIC | Status: DC | PRN
  Administered 2023-02-27: 4 [drp] via OTIC

## 2023-02-27 MED ORDER — LIDOCAINE HCL (CARDIAC) PF 100 MG/5ML IV SOSY
PREFILLED_SYRINGE | INTRAVENOUS | Status: DC | PRN
  Administered 2023-02-27: 60 mg via INTRAVENOUS

## 2023-02-27 MED ORDER — OXYCODONE HCL 5 MG PO TABS
5.0000 mg | ORAL_TABLET | Freq: Once | ORAL | Status: AC
  Administered 2023-02-27: 5 mg via ORAL

## 2023-02-27 MED ORDER — AMISULPRIDE (ANTIEMETIC) 5 MG/2ML IV SOLN
INTRAVENOUS | Status: AC
  Filled 2023-02-27: qty 4

## 2023-02-27 MED ORDER — AMISULPRIDE (ANTIEMETIC) 5 MG/2ML IV SOLN: 10.0000 mg | Freq: Once | INTRAVENOUS | Status: DC

## 2023-02-27 SURGICAL SUPPLY — 56 items
BENZOIN TINCTURE PRP APPL 2/3 (GAUZE/BANDAGES/DRESSINGS) IMPLANT
BLADE CLIPPER SURG (BLADE) IMPLANT
BLADE NDL 3 SS STRL (BLADE) IMPLANT
BLADE NEEDLE 3 SS STRL (BLADE) IMPLANT
BNDG GAUZE DERMACEA FLUFF 4 (GAUZE/BANDAGES/DRESSINGS) IMPLANT
BNDG STRETCH GAUZE 3IN X12FT (GAUZE/BANDAGES/DRESSINGS) IMPLANT
CANISTER SUCT 1200ML W/VALVE (MISCELLANEOUS) ×1 IMPLANT
CLEANER CAUTERY TIP PAD (MISCELLANEOUS) ×1 IMPLANT
COTTONBALL LRG STERILE PKG (GAUZE/BANDAGES/DRESSINGS) ×1 IMPLANT
DERMABOND ADVANCED .7 DNX12 (GAUZE/BANDAGES/DRESSINGS) ×1 IMPLANT
DRAPE EENT STERILE 33X51 (DRAPES) IMPLANT
DRAPE INCISE 23X17 STRL (DRAPES) IMPLANT
DRAPE INCISE IOBAN 23X17 STRL (DRAPES) IMPLANT
DRAPE MICROSCOPE URBAN (DRAPES) ×1 IMPLANT
DRAPE MICROSCOPE WILD 40.5X102 (DRAPES) IMPLANT
DROPPER MEDICINE STER 1.5ML LF (MISCELLANEOUS) IMPLANT
DRSG GLASSCOCK MASTOID ADT (GAUZE/BANDAGES/DRESSINGS) IMPLANT
DRSG GLASSCOCK MASTOID PED (GAUZE/BANDAGES/DRESSINGS) IMPLANT
DRSG TELFA 3X8 NADH STRL (GAUZE/BANDAGES/DRESSINGS) IMPLANT
ELECT COATED BLADE 2.86 ST (ELECTRODE) ×1 IMPLANT
ELECT REM PT RETURN 9FT ADLT (ELECTROSURGICAL) ×1 IMPLANT
ELECTRODE REM PT RTRN 9FT ADLT (ELECTROSURGICAL) ×1 IMPLANT
GAUZE 4X4 16PLY ~~LOC~~+RFID DBL (SPONGE) IMPLANT
GAUZE SPONGE 4X4 12PLY STRL (GAUZE/BANDAGES/DRESSINGS) IMPLANT
GAUZE SPONGE 4X4 12PLY STRL LF (GAUZE/BANDAGES/DRESSINGS) IMPLANT
GLOVE BIOGEL PI IND STRL 7.0 (GLOVE) IMPLANT
GLOVE ECLIPSE 6.5 STRL STRAW (GLOVE) IMPLANT
GLOVE ECLIPSE 7.5 STRL STRAW (GLOVE) ×1 IMPLANT
GOWN STRL REUS W/ TWL LRG LVL3 (GOWN DISPOSABLE) ×1 IMPLANT
GOWN STRL REUS W/ TWL XL LVL3 (GOWN DISPOSABLE) ×1 IMPLANT
IV CATH AUTO 14GX1.75 SAFE ORG (IV SOLUTION) IMPLANT
IV SET EXT 30 76VOL 4 MALE LL (IV SETS) ×1 IMPLANT
NDL PRECISIONGLIDE 27X1.5 (NEEDLE) ×1 IMPLANT
NDL SAFETY ECLIPSE 18X1.5 (NEEDLE) ×1 IMPLANT
NEEDLE PRECISIONGLIDE 27X1.5 (NEEDLE) ×1 IMPLANT
NS IRRIG 1000ML POUR BTL (IV SOLUTION) ×1 IMPLANT
PACK BASIN DAY SURGERY FS (CUSTOM PROCEDURE TRAY) ×1 IMPLANT
PACK ENT DAY SURGERY (CUSTOM PROCEDURE TRAY) ×1 IMPLANT
PENCIL FOOT CONTROL (ELECTRODE) ×1 IMPLANT
SHEET MEDIUM DRAPE 40X70 STRL (DRAPES) IMPLANT
SLEEVE SCD COMPRESS KNEE MED (STOCKING) IMPLANT
SPIKE FLUID TRANSFER (MISCELLANEOUS) ×1 IMPLANT
SPONGE SURGIFOAM ABS GEL 12-7 (HEMOSTASIS) ×1 IMPLANT
STRIP CLOSURE SKIN 1/2X4 (GAUZE/BANDAGES/DRESSINGS) IMPLANT
SUT CHROMIC 3 0 PS 2 (SUTURE) IMPLANT
SUT CHROMIC 4 0 P 3 18 (SUTURE) IMPLANT
SUT CHROMIC 4 0 PS 2 18 (SUTURE) IMPLANT
SUT NYLON ETHILON 5-0 P-3 1X18 (SUTURE) IMPLANT
SUT VIC AB 3-0 FS2 27 (SUTURE) IMPLANT
SUT VICRYL RAPIDE 4-0 (SUTURE) IMPLANT
SUT VICRYL RAPIDE 4/0 PS 2 (SUTURE) IMPLANT
SYR 5ML LL (SYRINGE) IMPLANT
SYR BULB EAR ULCER 3OZ GRN STR (SYRINGE) IMPLANT
TOWEL GREEN STERILE FF (TOWEL DISPOSABLE) ×1 IMPLANT
TRAY DSU PREP LF (CUSTOM PROCEDURE TRAY) ×1 IMPLANT
TUBING IRRIGATION (MISCELLANEOUS) IMPLANT

## 2023-02-27 NOTE — Anesthesia Preprocedure Evaluation (Addendum)
Anesthesia Evaluation  Patient identified by MRN, date of birth, ID band Patient awake  General Assessment Comment:Perforation of right tympanic membrane; Mixed conductive and sensorineural hearing loss of right ear; Tinnitus of right ear  Reviewed: Allergy & Precautions, NPO status , Patient's Chart, lab work & pertinent test results  Airway Mallampati: II  TM Distance: >3 FB Neck ROM: Full    Dental  (+) Dental Advisory Given, Upper Dentures, Lower Dentures   Pulmonary asthma , former smoker   Pulmonary exam normal breath sounds clear to auscultation       Cardiovascular hypertension, Pt. on medications + CAD  Normal cardiovascular exam Rhythm:Regular Rate:Normal     Neuro/Psych  PSYCHIATRIC DISORDERS Anxiety     negative neurological ROS     GI/Hepatic negative GI ROS, Neg liver ROS,,,  Endo/Other  Hypothyroidism    Renal/GU negative Renal ROS     Musculoskeletal  (+) Arthritis , Rheumatoid disorders,    Abdominal   Peds  Hematology negative hematology ROS (+)   Anesthesia Other Findings Day of surgery medications reviewed with the patient.  Reproductive/Obstetrics                             Anesthesia Physical Anesthesia Plan  ASA: 3  Anesthesia Plan: General   Post-op Pain Management: Tylenol PO (pre-op)*   Induction: Intravenous  PONV Risk Score and Plan: 4 or greater and Dexamethasone and Ondansetron  Airway Management Planned: Oral ETT  Additional Equipment:   Intra-op Plan:   Post-operative Plan: Extubation in OR  Informed Consent: I have reviewed the patients History and Physical, chart, labs and discussed the procedure including the risks, benefits and alternatives for the proposed anesthesia with the patient or authorized representative who has indicated his/her understanding and acceptance.     Dental advisory given  Plan Discussed with: CRNA  Anesthesia  Plan Comments:        Anesthesia Quick Evaluation

## 2023-02-27 NOTE — ED Triage Notes (Signed)
Patient presents via EMS from home with c/o nausea and vomiting since this evening. Patient reports sx possibly related to anesthesia given this am s/p right ear surgery.   Patient medicated with 4mg  IV Zofran en route per EMS with no relief. Patient actively vomiting upon arrival to ED

## 2023-02-27 NOTE — Transfer of Care (Signed)
Immediate Anesthesia Transfer of Care Note  Patient: Brandy Townsend  Procedure(s) Performed: TYMPANOPLASTY (Right: Ear)  Patient Location: PACU  Anesthesia Type:General  Level of Consciousness: awake, alert , and oriented  Airway & Oxygen Therapy: Patient Spontanous Breathing and Patient connected to face mask oxygen  Post-op Assessment: Report given to RN and Post -op Vital signs reviewed and stable  Post vital signs: Reviewed and stable  Last Vitals:  Vitals Value Taken Time  BP 126/52 02/27/23 1117  Temp    Pulse 87 02/27/23 1118  Resp 16 02/27/23 1118  SpO2 100 % 02/27/23 1118  Vitals shown include unfiled device data.  Last Pain:  Vitals:   02/27/23 0801  TempSrc: Oral  PainSc: 0-No pain      Patients Stated Pain Goal: 4 (02/27/23 0801)  Complications: No notable events documented.

## 2023-02-27 NOTE — Discharge Instructions (Addendum)
Remove the dressing on Tuesday.   Undo the strap and then remove the entire dressing.  The small adhesive pad in the forehead can then be peeled off the skin.  Behind the ear is a thin dressing which can be removed.  It is not attached.  The incision behind the ear is glued together so there is no care involved there.  In the ear canal there is a cottonball.  Please remove the cottonball and place the eardrops in the ear and then replace a fresh cottonball.  Repeat this twice daily until the next office visit.    Keep water out of the ear.  You may hold a small dry cloth up against the ear if you would like to shower.  The incision and glue can get wet but do not let any water get inside the ear.  Avoid blowing the nose, make sure if you sneeze your mouth is open.  Avoid any strenuous activity.   Post Anesthesia Home Care Instructions  Activity: Get plenty of rest for the remainder of the day. A responsible individual must stay with you for 24 hours following the procedure.  For the next 24 hours, DO NOT: -Drive a car -Advertising copywriter -Drink alcoholic beverages -Take any medication unless instructed by your physician -Make any legal decisions or sign important papers.  Meals: Start with liquid foods such as gelatin or soup. Progress to regular foods as tolerated. Avoid greasy, spicy, heavy foods. If nausea and/or vomiting occur, drink only clear liquids until the nausea and/or vomiting subsides. Call your physician if vomiting continues.  Special Instructions/Symptoms: Your throat may feel dry or sore from the anesthesia or the breathing tube placed in your throat during surgery. If this causes discomfort, gargle with warm salt water. The discomfort should disappear within 24 hours.  If you had a scopolamine patch placed behind your ear for the management of post- operative nausea and/or vomiting:  1. The medication in the patch is effective for 72 hours, after which it should be  removed.  Wrap patch in a tissue and discard in the trash. Wash hands thoroughly with soap and water. 2. You may remove the patch earlier than 72 hours if you experience unpleasant side effects which may include dry mouth, dizziness or visual disturbances. 3. Avoid touching the patch. Wash your hands with soap and water after contact with the patch.

## 2023-02-27 NOTE — Anesthesia Postprocedure Evaluation (Signed)
Anesthesia Post Note  Patient: Brandy Townsend  Procedure(s) Performed: TYMPANOPLASTY (Right: Ear)     Patient location during evaluation: PACU Anesthesia Type: General Level of consciousness: awake and alert Pain management: pain level controlled Vital Signs Assessment: post-procedure vital signs reviewed and stable Respiratory status: spontaneous breathing, nonlabored ventilation and respiratory function stable Cardiovascular status: blood pressure returned to baseline and stable Postop Assessment: no apparent nausea or vomiting Anesthetic complications: no   No notable events documented.  Last Vitals:  Vitals:   02/27/23 1245 02/27/23 1304  BP: 109/67 123/85  Pulse: 82 82  Resp: 20 16  Temp:  36.6 C  SpO2: 96% 98%    Last Pain:  Vitals:   02/27/23 1304  TempSrc:   PainSc: 6                  Collene Schlichter

## 2023-02-27 NOTE — Anesthesia Procedure Notes (Signed)
Procedure Name: Intubation Date/Time: 02/27/2023 9:57 AM  Performed by: Karen Kitchens, CRNAPre-anesthesia Checklist: Patient identified, Emergency Drugs available, Suction available and Patient being monitored Patient Re-evaluated:Patient Re-evaluated prior to induction Oxygen Delivery Method: Circle system utilized Preoxygenation: Pre-oxygenation with 100% oxygen Induction Type: IV induction Ventilation: Mask ventilation without difficulty Laryngoscope Size: Mac and 4 Grade View: Grade I Tube type: Oral Tube size: 7.0 mm Number of attempts: 1 Airway Equipment and Method: Stylet Placement Confirmation: ETT inserted through vocal cords under direct vision, positive ETCO2 and breath sounds checked- equal and bilateral Secured at: 20 cm Tube secured with: Tape Dental Injury: Teeth and Oropharynx as per pre-operative assessment

## 2023-02-27 NOTE — Op Note (Signed)
OPERATIVE REPORT  DATE OF SURGERY: 02/27/2023  PATIENT:  Brandy Townsend,  72 y.o. female  PRE-OPERATIVE DIAGNOSIS:  Perforation of right tympanic membrane; Mixed conductive and sensorineural hearing loss of right ear; Tinnitus of right ear  POST-OPERATIVE DIAGNOSIS:  Perforation of right tympanic membrane; Mixed conductive and sensorineural hearing loss of right ear; Tinnitus of right ear  PROCEDURE:  Procedure(s): TYMPANOPLASTY, right  SURGEON:  Susy Frizzle, MD  ASSISTANTS: None  ANESTHESIA:   General   EBL: Less than 30 ml  DRAINS: None  LOCAL MEDICATIONS USED: 1% Xylocaine with epinephrine  SPECIMEN:  none  COUNTS:  Correct  PROCEDURE DETAILS: The patient was taken to the operating room and placed on the operating table in the supine position. Following induction of general endotracheal anesthesia, the right ear was prepped and draped in standard fashion.  Local anesthetic was infiltrated into the external auditory canal in 4 quadrants.  Postauricular incision was infiltrated as well.  The operating microscope was draped sterilely and used throughout the case.  The ear canal was inspected and cleaned of a small amount of cerumen.  The tympanic membrane was evaluated and there is a large near-total perforation of the pars tensa.  There is about 60-70% perforation with the anterior superior quadrant remaining.  A sharp pick was used to clean off the edges of the perforation around the umbo, and along the anterior margin.  A tab knife was used to incise the mucosa and elevate off of the annulus all around.  The incus and stapes appeared to be in good condition.  The long process of the malleus was also in good position.  The sharp pick was used to dissect the epithelium off of the manubrium.  A postauricular incision was created using electrocautery exposing the temporalis fascia.  A temporalis fascia graft was harvested pressed and dried on the back table.  The incision was  reapproximated with interrupted deep 3 oh wrap a day and a running 3 oh rapid a subcuticular closure.  Dermabond was used on the skin.  The middle ear was then reevaluated.  A postauricular incision was created using a sickle knife at 4:00 and 8:00 and a round knife to create a flap and elevated towards the annulus.  There was no evidence of epithelium in the middle ear.  The middle ear was packed with saline soaked Gelfoam pieces.  The graft was then placed into the middle ear in an underlay technique taking great care to make sure the edges of the perforation were lying flat along the graft.  Additional Gelfoam packing was placed in the middle ear to help support it.  The tympanomeatal flap was bisected with Bellucci scissors to facilitate positioning.  Once the graft was then good position the ear canal was packed with Ciprodex soaked Gelfoam.  A Glasscock dressing was then applied.  Patient was awakened extubated and transferred to recovery in stable condition.  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    PATIENT DISPOSITION:  To PACU, stable

## 2023-02-27 NOTE — H&P (Signed)
HPI:   Brandy Townsend is a 72 y.o. female who presents as a return Patient.   Referring Provider: No ref. provider found  Chief complaint: Ear.  HPI: Interested in having repair of the right eardrum perforation. She would like to get back into swimming. She would like to have improved hearing on the right side especially when driving since her husband does not drive. She suffers with rheumatoid arthritis is currently treated by Dr. Dierdre Forth. She is on an injectable medication on a monthly basis.  PMH/Meds/All/SocHx/FamHx/ROS:   Past Medical History:  Diagnosis Date  Asthma  Hypertension  Hyperthyroidism   History reviewed. No pertinent surgical history.  No family history of bleeding disorders, wound healing problems or difficulty with anesthesia.     Current Outpatient Medications:  abatacept (Orencia, with maltose,) 250 mg injection, See admin instructions., Disp: , Rfl:  albuterol HFA (PROVENTIL HFA;VENTOLIN HFA;PROAIR HFA) 90 mcg/actuation inhaler, , Disp: , Rfl:  Breo Ellipta 100-25 mcg/dose dsdv inhaler, TAKE 1 PUFF BY MOUTH EVERY DAY, Disp: , Rfl:  budesonide-glycopyr-formoterol (Breztri Aerosphere) 160-9-4.8 mcg/actuation HFAA, Inhale 2 puffs., Disp: , Rfl:  cetirizine (ZyrTEC) 10 mg tablet, TAKE 1 TABLET EVERY DAY AS NEEDED, Disp: , Rfl: 3 Dulera 200-5 mcg/actuation HFAA inhaler, , Disp: , Rfl:  Klor-Con 10 10 mEq ER tablet, TAKE 1 TABLET BY MOUTH EVERY DAY, Disp: , Rfl: 1 leflunomide (ARAVA) 20 mg tablet, TAKE 1 TABLET BY MOUTH EVERY DAY, Disp: , Rfl:  levothyroxine (SYNTHROID) 50 mcg tablet, TAKE 1 TABLET BY MOUTH EVERY MORNING 6 DAYS A WEEK, HOLD ON SUNDAYS, Disp: , Rfl:  medroxyPROGESTERone (PROVERA) 10 mg tablet, , Disp: , Rfl:  montelukast (SINGULAIR) 10 mg tablet, Take 10 mg by mouth nightly., Disp: 30 tablet, Rfl: 2 nitroglycerin (NITROSTAT) 0.4 mg SL tablet, PLACE 1 TABLET UNDER THE TONGUE EVERY 5 MINUTES AS NEEDED FOR CHEST PAIN., Disp: , Rfl:  rosuvastatin  (CRESTOR) 10 mg tablet, , Disp: , Rfl:  torsemide (DEMADEX) 20 mg tablet, TAKE 1 TABLET EVERY DAY AND 1/2 TABLET IN THE EVENING, Disp: , Rfl:  verapamil SR (CALAN-SR) 120 mg CR tablet, TAKE 1 TABLET BY MOUTH EVERY DAY, Disp: , Rfl:  azelastine (ASTELIN) 137 mcg (0.1 %) nasal spray, 1 spray 2 (two) times a day for 90 days., Disp: 30 mL, Rfl: 11  A complete ROS was performed with pertinent positives/negatives noted in the HPI. The remainder of the ROS are negative.   Physical Exam:   Temp 96.9 F (36.1 C) (Temporal)  Resp 16  Ht 1.613 m (5' 3.5")  Wt 70.1 kg (154 lb 9.6 oz)  BMI 26.95 kg/m   General: Healthy and alert, in no distress, breathing easily. Normal affect. In a pleasant mood. Head: Normocephalic, atraumatic. No masses, or scars. Eyes: Pupils are equal, and reactive to light. Vision is grossly intact. No spontaneous or gaze nystagmus. Ears: Ear canals are clear. Left tympanic membrane and middle ear are normal to inspection. Right tympanic membrane with a 50% perforation of the pars tensa based posteroinferiorly. The ossicular chain is partially visible and appears to be intact. Hearing: Tuning fork lateralizes to the right. Nose: Nasal cavities are clear with healthy mucosa, no polyps or exudate. Airways are patent. Face: No masses or scars, facial nerve function is symmetric. Oral Cavity: No mucosal abnormalities are noted. Tongue with normal mobility. Dentition appears healthy. Oropharynx: Tonsils are symmetric. There are no mucosal masses identified. Tongue base appears normal and healthy. Larynx/Hypopharynx: deferred Chest: Deferred Neck: No palpable masses, no  cervical adenopathy, no thyroid nodules or enlargement. Neuro: Cranial nerves II-XII with normal function. Balance: Normal gate. Other findings: none.  Independent Review of Additional Tests or Records:  Audiogram:  Procedures:  none  Impression & Plans:  Right tympanic membrane perforation with hearing  loss. Perforation is clean and dry. There is no infection. Consider tympanoplasty surgery. We discussed the risks and benefits and the 80-85% chance of success. I will talk with her rheumatologist about possibly stopping her immune modulator medication temporarily if necessary. We will schedule at her convenience.

## 2023-02-28 ENCOUNTER — Encounter (HOSPITAL_BASED_OUTPATIENT_CLINIC_OR_DEPARTMENT_OTHER): Payer: Self-pay | Admitting: Otolaryngology

## 2023-02-28 DIAGNOSIS — R112 Nausea with vomiting, unspecified: Secondary | ICD-10-CM | POA: Diagnosis not present

## 2023-02-28 DIAGNOSIS — H7291 Unspecified perforation of tympanic membrane, right ear: Secondary | ICD-10-CM | POA: Diagnosis not present

## 2023-02-28 LAB — CBC WITH DIFFERENTIAL/PLATELET
Abs Immature Granulocytes: 0.04 10*3/uL (ref 0.00–0.07)
Basophils Absolute: 0 10*3/uL (ref 0.0–0.1)
Basophils Relative: 0 %
Eosinophils Absolute: 0.1 10*3/uL (ref 0.0–0.5)
Eosinophils Relative: 1 %
HCT: 37.8 % (ref 36.0–46.0)
Hemoglobin: 12.2 g/dL (ref 12.0–15.0)
Immature Granulocytes: 0 %
Lymphocytes Relative: 17 %
Lymphs Abs: 1.9 10*3/uL (ref 0.7–4.0)
MCH: 29.3 pg (ref 26.0–34.0)
MCHC: 32.3 g/dL (ref 30.0–36.0)
MCV: 90.9 fL (ref 80.0–100.0)
Monocytes Absolute: 1.1 10*3/uL — ABNORMAL HIGH (ref 0.1–1.0)
Monocytes Relative: 10 %
Neutro Abs: 7.9 10*3/uL — ABNORMAL HIGH (ref 1.7–7.7)
Neutrophils Relative %: 72 %
Platelets: 256 10*3/uL (ref 150–400)
RBC: 4.16 MIL/uL (ref 3.87–5.11)
RDW: 14.5 % (ref 11.5–15.5)
WBC: 11.1 10*3/uL — ABNORMAL HIGH (ref 4.0–10.5)
nRBC: 0 % (ref 0.0–0.2)

## 2023-02-28 LAB — COMPREHENSIVE METABOLIC PANEL
ALT: 13 U/L (ref 0–44)
AST: 11 U/L — ABNORMAL LOW (ref 15–41)
Albumin: 3.6 g/dL (ref 3.5–5.0)
Alkaline Phosphatase: 71 U/L (ref 38–126)
Anion gap: 5 (ref 5–15)
BUN: 15 mg/dL (ref 8–23)
CO2: 30 mmol/L (ref 22–32)
Calcium: 9.1 mg/dL (ref 8.9–10.3)
Chloride: 106 mmol/L (ref 98–111)
Creatinine, Ser: 0.79 mg/dL (ref 0.44–1.00)
GFR, Estimated: >60 mL/min (ref >60)
Glucose, Bld: 106 mg/dL — ABNORMAL HIGH (ref 70–99)
Potassium: 3.9 mmol/L (ref 3.5–5.1)
Sodium: 141 mmol/L (ref 135–145)
Total Bilirubin: 0.4 mg/dL (ref <1.2)
Total Protein: 5.8 g/dL — ABNORMAL LOW (ref 6.5–8.1)

## 2023-02-28 LAB — URINALYSIS, ROUTINE W REFLEX MICROSCOPIC
Bilirubin Urine: NEGATIVE
Glucose, UA: NEGATIVE mg/dL
Hgb urine dipstick: NEGATIVE
Ketones, ur: NEGATIVE mg/dL
Leukocytes,Ua: NEGATIVE
Nitrite: NEGATIVE
Protein, ur: NEGATIVE mg/dL
Specific Gravity, Urine: 1.015 (ref 1.005–1.030)
pH: 6 (ref 5.0–8.0)

## 2023-02-28 MED ORDER — SODIUM CHLORIDE 0.9 % IV BOLUS
1000.0000 mL | Freq: Once | INTRAVENOUS | Status: AC
Start: 2023-02-28 — End: 2023-02-28
  Administered 2023-02-28: 1000 mL via INTRAVENOUS

## 2023-02-28 MED ORDER — OXYCODONE-ACETAMINOPHEN 5-325 MG PO TABS
1.0000 | ORAL_TABLET | Freq: Once | ORAL | Status: AC
  Administered 2023-02-28: 1 via ORAL
  Filled 2023-02-28: qty 1

## 2023-02-28 MED ORDER — ONDANSETRON HCL 4 MG/2ML IJ SOLN
4.0000 mg | Freq: Once | INTRAMUSCULAR | Status: AC
  Administered 2023-02-28: 4 mg via INTRAVENOUS
  Filled 2023-02-28: qty 2

## 2023-02-28 NOTE — ED Provider Notes (Signed)
[place] [facility] AT [facility] Provider Note   CSN: [id number] Arrival date & time: 02/27/23  2256     History  Chief Complaint  Patient presents with   Emesis    Brandy Townsend is a 72 y.o. female.  HPI     This is a 72 year old female who presents with hypoxia and nausea.  Patient had a tympanoplasty around 11 AM yesterday.  She was discharged home in afternoon.  She has a history of asthma and reports that when she got home she had some right sided chest discomfort and noted her oxygen levels to be in the mid 80s.  She states she took some deep breath and slowly her oxygen levels improved.  However, she states that she continued to have some intermittent hypoxia.  Her chest discomfort and hypoxia also seem to improved after an albuterol inhaler.  She states that since the surgery she has had some ongoing nausea.  She has never had general anesthesia before.  She is not using incentive spirometry as she was told not to blow her nose after surgery although her pulmonologist indicated that she should use her incentive spirometer postsurgery.  Home Medications Prior to Admission medications   Medication Sig Start Date End Date Taking? Authorizing Provider  abatacept (ORENCIA) 250 MG injection Inject 250 mg into the vein See admin instructions. Inject 250 mg IV once every month.    [provider]  albuterol (VENTOLIN HFA) 108 (90 Base) MCG/ACT inhaler Inhale 1-2 puffs into the lungs every 6 (six) hours as needed for wheezing or shortness of breath. 12/02/21   Parrett, Virgel Bouquet, NP  aspirin EC 81 MG tablet Take 1 tablet (81 mg total) by mouth daily. Swallow whole. 12/17/19   Rosalio Macadamia, NP  Budeson-Glycopyrrol-Formoterol (BREZTRI AEROSPHERE) 160-9-4.8 MCG/ACT AERO Inhale 2 puffs into the lungs in the morning and at bedtime. 01/13/23   Kalman Shan, MD  cetirizine (ZYRTEC) 10 MG tablet Take 10 mg by mouth at bedtime as needed for allergies.      [provider]  diclofenac Sodium (VOLTAREN) 1 % GEL Apply 4 g topically 4 (four) times daily. 11/12/22   Palumbo, April, MD  KLOR-CON 10 10 MEQ tablet Take 10 mEq by mouth daily. 08/11/14   [provider]  levothyroxine (SYNTHROID) 50 MCG tablet Take 50 mcg by mouth daily.  08/16/18   [provider]  montelukast (SINGULAIR) 10 MG tablet Take 10 mg by mouth daily. 11/17/20   [provider]  Multiple Vitamin (MULTIVITAMIN) tablet Take 1 tablet by mouth daily.    [provider]  Multiple Vitamins-Minerals (EQ MULTIVITAMINS ADULT GUMMY PO) Take 1 each by mouth daily.    [provider]  nitroGLYCERIN (NITROSTAT) 0.4 MG SL tablet Place 1 tablet (0.4 mg total) under the tongue every 5 (five) minutes as needed for chest pain. 05/27/20   Wendall Stade, MD  Probiotic Product (PROBIOTIC GUMMIES PO) Take by mouth daily as needed.    [provider]  rosuvastatin (CRESTOR) 10 MG tablet TAKE 1 TABLET BY MOUTH EVERY DAY 10/04/22   Wendall Stade, MD  torsemide (DEMADEX) 20 MG tablet Take 1 tablet (20 mg total) by mouth daily. 05/15/19   Rosalio Macadamia, NP  Turmeric (QC TUMERIC COMPLEX PO) Take by mouth daily.    [provider]  verapamil (CALAN-SR) 240 MG CR tablet TAKE 1 TABLET BY MOUTH EVERY DAY 01/05/23   Wendall Stade, MD  Vitamin  D, Ergocalciferol, (DRISDOL) 1.25 MG (50000 UNIT) CAPS capsule Take 50,000 Units by mouth every 7 (seven) days. Monday    [provider]      Allergies    Cat hair extract, Dust mite extract, Morphine and codeine, and Macrobid [nitrofurantoin monohyd macro]    Review of Systems   Review of Systems  Constitutional:  Negative for fever.  Respiratory:  Positive for shortness of breath.   Gastrointestinal:  Positive for nausea.  All other systems reviewed and are negative.   Physical Exam Updated Vital Signs BP (!) 101/53   Pulse 73   Temp 98.3 F (36.8 C) (Oral)   Resp 10   SpO2 92%   Physical Exam Vitals and nursing note reviewed.  Constitutional:      Appearance: She is well-developed. She is not ill-appearing.  HENT:     Head: Normocephalic.     Comments: Protective dressing right ear Eyes:     Pupils: Pupils are equal, round, and reactive to light.  Cardiovascular:     Rate and Rhythm: Normal rate and regular rhythm.     Heart sounds: Normal heart sounds.  Pulmonary:     Effort: Pulmonary effort is normal. No respiratory distress.     Breath sounds: No wheezing.     Comments: Diminished breath sounds at all lung fields no significant wheezing Abdominal:     General: Bowel sounds are normal.     Palpations: Abdomen is soft.     Tenderness: There is no abdominal tenderness. There is no guarding or rebound.  Musculoskeletal:     Cervical back: Neck supple.  Skin:    General: Skin is warm and dry.  Neurological:     Mental Status: She is alert and oriented to person, place, and time.  Psychiatric:        Mood and Affect: Mood normal.     ED Results / Procedures / Treatments   Labs (all labs ordered are listed, but only abnormal results are displayed) Labs Reviewed - No data to display  EKG EKG Interpretation Date/Time:  Tuesday February 28 2023 02:55:12 EST Ventricular Rate:  72 PR Interval:  133 QRS Duration:  83 QT Interval:  360 QTC Calculation: 394 R Axis:   45  Text Interpretation: Sinus rhythm Abnormal R-wave progression, early transition Confirmed by Ross Marcus ([id number]) on 02/28/2023 3:10:20 AM  Radiology DG Chest Portable 1 View  Result Date: 02/28/2023 CLINICAL DATA:  Nausea and vomiting. EXAM: PORTABLE CHEST 1 VIEW COMPARISON:  February 22, 2023 FINDINGS: The heart size and mediastinal contours are within normal limits. Stable, diffuse, mild to moderate severity increased interstitial lung markings are seen. There is no evidence of an acute infiltrate, pleural effusion or pneumothorax. Multilevel degenerative changes seen  throughout the thoracic spine. IMPRESSION: Stable chronic interstitial lung disease without evidence of acute or active cardiopulmonary disease. Electronically Signed   By: Aram Candela M.D.   On: 02/28/2023 02:52    Procedures Procedures    Medications Ordered in ED Medications  ondansetron (ZOFRAN) injection 4 mg (4 mg Intravenous Given 02/27/23 2347)  ipratropium-albuterol (DUONEB) 0.5-2.5 (3) MG/3ML nebulizer solution 3 mL (3 mLs Nebulization Given 02/27/23 2351)  oxyCODONE-acetaminophen (PERCOCET/ROXICET) 5-325 MG per tablet 1 tablet (1 tablet Oral Given 02/28/23 0212)    ED Course/ Medical Decision Making/ A&P Clinical Course as of 02/28/23 9562  Tue Feb 28, 2023  1308 Spoke with Dr. Jenne Pane, ENT.  Discussed safety of incentive spirometry given recent procedure and patient was  told not to blow her nose.  Dr. Jenne Pane states incentive spirometry ok. [CH]    Clinical Course User Index [CH] Parminder Trapani, Mayer Masker, MD                                 Medical Decision Making Amount and/or Complexity of Data Reviewed Radiology: ordered.  Risk Prescription drug management.   This patient presents to the ED for concern of hypoxia, nausea vomiting, this involves an extensive number of treatment options, and is a complaint that carries with it a high risk of complications and morbidity.  I considered the following differential and admission for this acute, potentially life threatening condition.  The differential diagnosis includes atelectasis, pneumonia, asthma, less likely ACS  MDM:    This is a 72 year old female who presents with concern for low oxygen levels at home.  Also noted to have some nausea.  She is less than 24 hours out from tympanoplasty.  She noted her oxygen levels to be slightly low at home in the mid 80s.  Reports she has not been using incentive spirometry as recommended by pulmonology secondary to recommendations from ENT to not blow her nose.  She has not had any fevers  or cough.  On exam she is not wheezing and has fairly clear breath sounds.  EKG shows no evidence of arrhythmia or ischemia.  Chest x-ray without pneumothorax or pneumonia.  Oxygen levels have been from high 80s to high 90s.  She self recovers quickly.  Suspect that this is likely atelectasis related.  She is not having any ongoing chest pain.  She was given a DuoNeb with minimal improvement.  See discussion with Dr. Jenne Pane above.  Feel she would benefit from incentive spirometry.  She feels comfortable doing this.  She was monitored in the ER overnight without any recurrence of hypoxia or recurrence of symptoms.  Feel she can be safely discharged home with pulmonology and ENT follow-up.  (Labs, imaging, consults)  Labs: I Ordered, and personally interpreted labs.  The pertinent results include: None  Imaging Studies ordered: I ordered imaging studies including chest x-ray I independently visualized and interpreted imaging. I agree with the radiologist interpretation  Additional history obtained from chart review.  External records from outside source obtained and reviewed including op notes and pulmonology notes  Cardiac Monitoring: The patient was maintained on a cardiac monitor.  If on the cardiac monitor, I personally viewed and interpreted the cardiac monitored which showed an underlying rhythm of: Sinus rhythm  Reevaluation: After the interventions noted above, I reevaluated the patient and found that they have :improved  Social Determinants of Health:  lives independently  Disposition: Discharge  Co morbidities that complicate the patient evaluation  Past Medical History:  Diagnosis Date   Asthma    CAD (coronary artery disease)    Diverticulosis    Graves disease    s/p RAI   High cholesterol    Hypertension    Hypothyroidism    Nephrolithiasis    Osteoporosis    Rheumatoid arthritis (HCC)    Vertigo      Medicines Meds ordered this encounter  Medications    ondansetron (ZOFRAN) injection 4 mg   ipratropium-albuterol (DUONEB) 0.5-2.5 (3) MG/3ML nebulizer solution 3 mL   oxyCODONE-acetaminophen (PERCOCET/ROXICET) 5-325 MG per tablet 1 tablet    I have reviewed the patients home medicines and have made adjustments as needed  Problem List / ED  Course: Problem List Items Addressed This Visit   None Visit Diagnoses     Atelectasis    -  Primary   Post-operative nausea and vomiting       Asthma, unspecified asthma severity, unspecified whether complicated, unspecified whether persistent       Relevant Medications   ipratropium-albuterol (DUONEB) 0.5-2.5 (3) MG/3ML nebulizer solution 3 mL (Completed)                   Final Clinical Impression(s) / ED Diagnoses Final diagnoses:  Atelectasis  Post-operative nausea and vomiting  Asthma, unspecified asthma severity, unspecified whether complicated, unspecified whether persistent    Rx / DC Orders ED Discharge Orders     None         Shon Baton, MD 02/28/23 [phone]

## 2023-02-28 NOTE — ED Notes (Addendum)
RT Note:    Patient oxygen saturation on room air while at rest= 96%, HR 79, RR18 Patient oxygen saturation on room air while ambulating- 95%, HR 84, RR18  Patient tolerated  walk well

## 2023-02-28 NOTE — Discharge Instructions (Signed)
You were seen today for concerns for low oxygen levels at home.  This does not appear consistent with bronchitis but more likely related to atelectasis from your recent surgery.  Make sure that you are using your incentive spirometer.  Continue your pain medication and nausea medication postoperatively.  Follow-up with pulmonary and ENT.  Oxygen levels overnight were reassuring.

## 2023-02-28 NOTE — ED Notes (Signed)
ED Provider at bedside. 

## 2023-02-28 NOTE — ED Notes (Signed)
Writer went into room to assess patient after seeing O2 drop to 85% with a good waveform. When talking with patient, O2 came quickly back up to 99%.

## 2023-02-28 NOTE — ED Notes (Signed)
Patient provided with IS per EDP. Patient able to perform IS independently with no distress noted.

## 2023-02-28 NOTE — ED Provider Notes (Signed)
Pt was waiting for d/c when she told the nurse she was still feeling nauseous and has not urinated since 1800 yesterday.  She was given IVFs and additional zofran and is now much improved.  Cmp nl, ua nl, cbc nl.    Pt has been ambulatory and is eating/drinking.  O2 sats have stayed excellent.  Pt is feeling able to go home now.  She knows to return if worse.  F/u with ENT and with PCP.   Jacalyn Lefevre, MD 02/28/23 1209

## 2023-03-01 ENCOUNTER — Telehealth: Payer: Self-pay | Admitting: Nurse Practitioner

## 2023-03-01 NOTE — Telephone Encounter (Signed)
Patient states oxygen levels dropped to the 80 % on 02/27/2023. Now in the upper 90 %. Was seen at the emergency room. Patient phone number is [phone].

## 2023-03-01 NOTE — Telephone Encounter (Signed)
Patient reports she was advised by ED to contact pulmonologist with oxygen level reading today. She reports oxygen level this morning was 97%%. She denies any shortness of breath. Her back pain has improved. Patient is still using incentive spirometry. Nothing further needed.

## 2023-03-01 NOTE — Telephone Encounter (Signed)
Needs to go back to the ED if she has low O2 levels <88-90% or return of symptoms. Could be sign of a blood clot with recent surgery.

## 2023-03-02 ENCOUNTER — Ambulatory Visit (HOSPITAL_COMMUNITY): Admit: 2023-03-02 | Payer: Medicare Other | Admitting: Otolaryngology

## 2023-03-02 SURGERY — TYMPANOPLASTY
Anesthesia: General

## 2023-03-03 DIAGNOSIS — R109 Unspecified abdominal pain: Secondary | ICD-10-CM | POA: Diagnosis not present

## 2023-03-03 DIAGNOSIS — K5730 Diverticulosis of large intestine without perforation or abscess without bleeding: Secondary | ICD-10-CM | POA: Diagnosis not present

## 2023-03-03 DIAGNOSIS — Z87442 Personal history of urinary calculi: Secondary | ICD-10-CM | POA: Diagnosis not present

## 2023-03-06 NOTE — Telephone Encounter (Signed)
Patient advised as below. She reports she is checking her oxygen level at home 2-3 times a day and has been staying at 98-99%. She is also using incentive spirometry. Patient would like to reiterate that her oxygen level had dropped due to the anesthesia. And reports she is still using the incentive spirometry.

## 2023-03-06 NOTE — Telephone Encounter (Signed)
Brandy Townsend seen the pt 02/22/23 and risk assessment was done then

## 2023-03-06 NOTE — Telephone Encounter (Signed)
If she's back to normal and not having any more issues with her oxygen, then likely just some postoperative atelectasis, which is decreased airflow through the lungs. Glad she is feeling better and back to normal!

## 2023-03-09 DIAGNOSIS — H7291 Unspecified perforation of tympanic membrane, right ear: Secondary | ICD-10-CM | POA: Diagnosis not present

## 2023-03-10 NOTE — Telephone Encounter (Signed)
Noted  

## 2023-03-16 DIAGNOSIS — M0579 Rheumatoid arthritis with rheumatoid factor of multiple sites without organ or systems involvement: Secondary | ICD-10-CM | POA: Diagnosis not present

## 2023-03-23 DIAGNOSIS — H7291 Unspecified perforation of tympanic membrane, right ear: Secondary | ICD-10-CM | POA: Diagnosis not present

## 2023-04-06 ENCOUNTER — Ambulatory Visit: Payer: Medicare Other | Admitting: Nurse Practitioner

## 2023-04-07 ENCOUNTER — Ambulatory Visit: Payer: Medicare Other | Admitting: Internal Medicine

## 2023-04-12 ENCOUNTER — Encounter: Payer: Self-pay | Admitting: Primary Care

## 2023-04-12 ENCOUNTER — Telehealth: Payer: Self-pay | Admitting: Primary Care

## 2023-04-12 ENCOUNTER — Ambulatory Visit: Payer: Medicare Other | Admitting: Primary Care

## 2023-04-12 VITALS — BP 101/66 | HR 62 | Temp 97.7°F | Ht 63.0 in | Wt 157.2 lb

## 2023-04-12 DIAGNOSIS — J4540 Moderate persistent asthma, uncomplicated: Secondary | ICD-10-CM

## 2023-04-12 DIAGNOSIS — M069 Rheumatoid arthritis, unspecified: Secondary | ICD-10-CM

## 2023-04-12 DIAGNOSIS — J9589 Other postprocedural complications and disorders of respiratory system, not elsewhere classified: Secondary | ICD-10-CM | POA: Diagnosis not present

## 2023-04-12 DIAGNOSIS — J9811 Atelectasis: Secondary | ICD-10-CM

## 2023-04-12 LAB — POCT EXHALED NITRIC OXIDE: FeNO level (ppb): 34

## 2023-04-12 MED ORDER — BREZTRI AEROSPHERE 160-9-4.8 MCG/ACT IN AERO: 2.0000 | INHALATION_SPRAY | Freq: Two times a day (BID) | RESPIRATORY_TRACT | Status: DC

## 2023-04-12 NOTE — Telephone Encounter (Signed)
 Patient of yours. She has RA, you mentioned in your note about possibly needing to discontinue Orencia  if patient continued to have recurrent respiratory infections. She reports that she has not but she did have an ED visit for hypoxemia related to atelectasis after having right tympanoplasty under general anesthesia with Dr. Donalee Fruits. She has recovered from this. She plans to see Dr. Ebbie Goldmann in February. FENO today was 34 (previous 60s). She is maintained on Breztri . No acute respiratory complaints.

## 2023-04-12 NOTE — Patient Instructions (Addendum)
 Surgery is the most common cause of atelectasis. When anesthesia is used during surgery to keep you asleep, you don't breathe deeply enough to fill your lungs all the way or cough to clear your lungs of mucus. This can lead to blockages or lack of air to the alveoli, causing resorptive atelectasis.   -POSTOPERATIVE ATELECTASIS: Postoperative atelectasis is a condition where part of your lung collapses or doesn't inflate properly after surgery. You experienced low oxygen levels after your tympanic membrane repair surgery, likely due to this condition. Continue using the incentive spirometer daily to help keep your lungs inflated.  -ASTHMA: Asthma is a chronic condition that affects your airways and can cause difficulty breathing. You are currently using Breztri  to manage your asthma, and your nitric oxide  levels have decreased. Continue taking Breztri  twice daily, and we will recheck your nitric oxide  levels today. FENO was 35 (improved significantly)  -RHEUMATOID ARTHRITIS: Rheumatoid arthritis is an autoimmune disease that causes joint inflammation and pain. You are on Orencia  for this condition and have reported significant improvement in your hand pain. Continue taking Orencia  as prescribed, and we will discuss potential changes to your medication with Dr. Bertrum Brodie before you see Dr. Ebbie Goldmann in February.  -UPPER RESPIRATORY CONGESTION: You have reported recent upper respiratory congestion, which may or may not be related to your recent ear surgery. Please contact Dr. Donalee Fruits regarding the use of nasal sprays to help manage this congestion.  INSTRUCTIONS: Please follow up with Dr. Donalee Fruits at the end of the month and Dr. Ebbie Goldmann in February.  Follow-up: 3-6 months with Dr. Bertrum Brodie

## 2023-04-12 NOTE — Progress Notes (Signed)
 @Patient  ID: Brandy Townsend, female    DOB: [birth year], 73 y.o.   MRN: [id number]  Chief Complaint  Patient presents with   Follow-up    Referring provider: Rosslyn Coons, MD  HPI: 73 year old female, former smoker followed for asthma. She is a patient of Dr. Mardell Shade and last seen in office 01/13/2023.  Past medical history significant for hypertension, CAD, allergic rhinitis, hypothyroid, mixed conductive and sensorineural hearing loss.  Chronic perforation of right tympanic membrane, RA on Orencia , anxiety.  TEST/EVENTS:  01/23/2020 PFT: FVC 69, FEV1 66, ratio 75, TLC 109, DLCOcor 96.  Significant bronchodilator response (19% change) 12/18/2022 CTA chest: No evidence of PE.  Asymmetrically enlarged right thyroid  gland, previously biopsied.  Mild diffuse bronchial wall thickening.  Increased biapical tree-in-bud nodules [not appreciated by Dr. Bertrum Brodie 01/13/2023 FeNO 66 ppb 01/16/2023 eos: 400   01/13/2023: OV with Dr. Bertrum Brodie.  Last seen spring 2024.  Overall was stable until 12/18/2022.  She went to the emergency department with multiple acute complaints.  Diagnosed with acute bronchitis based on CT scan findings.  PE ruled out.  Right upper lobe tree-in-bud infiltrates noted on the read but not appreciated when visualized.  Treated with antibiotic and feels back to baseline.  ACT score is good.  Nitric oxide  testing is actually elevated from her baseline of 50 to greater than 60 today.  Surprised by this.  Interested in therapeutic options and is worried her asthma would be getting out of control.  Review of labs indicates she has always had high elevated baseline eosinophils.  Skin allergy test with Dr. Almeda Jacobs was positive some years ago.  Of note, she has been on Orencia  which is known to cause respiratory exacerbations particularly in COPD patients.  Did have a recent infusion of that.  Stepped up to Breztri .  Depending on course, may need to discontinue Orencia  (if having multiple  respiratory exacerbations.  Could also consider biologic therapy.  Repeat CT of the chest in 1 year.  02/22/2023 Patient presents today for preoperative evaluation.  She is scheduled to have a tympanoplasty with Dr. Donalee Fruits for perforated eardrum, causing hearing loss.  She feels like her breathing has been better since she started on the Breztri .  She does feel like this is helping her.  Sure she did not feel that bad at the timing of starting it but has noticed a difference with it.  No significant cough, chest congestion or wheezing.  Allergies are at her baseline.  Feels like she is well-controlled today and wants to move forward with surgery.  FeNO 50    04/12/2023- Interim hx  Patient presents today for 6 week follow-up.   Discussed the use of AI scribe software for clinical note transcription with the patient, who gave verbal consent to proceed.  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 This was her first experience with general anesthesia. Post-surgery, the patient reported no pain but took prescribed hydrocodone  as a precaution. Approximately 30 minutes later, she woke up with discomfort in the back of her head. Upon checking her oxygen level, it was found to be at 80. After using her rescue inhaler twice, the level increased to 84, but she was advised to go to the ER.  At the ER, the patient's oxygen level fluctuated, dropping low but rebounding quickly. She was discharged the next day, but her  oxygen levels remained lower than usual, staying around 93-94. The patient has not experienced levels below 92 since the surgery.  In the week following the surgery, the patient reported feeling like she developed a kidney stone, had a stomach bug, and picked up a congestion bug from an ER visit with her husband. Despite these issues, her oxygen levels have not dropped  below 93.  The patient has been using an incentive spirometer almost daily since the surgery. She also reported a cough, but it is not producing anything and she believes it is more related to postnasal drip. The patient has been using Breztri  for her asthma and her nitric oxide  levels have decreased from 60 to 50 since starting this medication.  The patient also has a history of rheumatoid arthritis and has been on Orencia  for two and a half years. She reported that her hand pain, which was previously severe, has significantly improved since switching her turmeric supplement in mid-October. She has a little hip pain, but no other pain.       Allergies  Allergen Reactions   Cat Dander     Other reaction(s): congestion   Dust Mite Extract     Other reaction(s): Unknown   Morphine  And Codeine Nausea And Vomiting   Macrobid [Nitrofurantoin Monohyd Macro] Other (See Comments)    Lowered potassium, caused aches and pains    Immunization History  Administered Date(s) Administered   Fluad Quad(high Dose 65+) 01/20/2020, 12/31/2021   Fluad Trivalent(High Dose 65+) 01/09/2023   Influenza Whole 01/04/2017   Influenza, High Dose Seasonal PF 01/03/2017, 12/27/2018, 03/03/2021   Influenza, Quadrivalent, Recombinant, Inj, Pf 01/09/2018, 01/11/2019   Influenza,inj,quad, With Preservative 12/26/2016   Influenza-Unspecified 12/26/2013, 12/27/2014, 12/27/2015   PFIZER(Purple Top)SARS-COV-2 Vaccination 04/17/2019, 05/06/2019, 01/31/2020   Pfizer Covid-19 Vaccine Bivalent Booster 19yrs & up 01/22/2021   Pneumococcal Conjugate-13 04/28/2014, 05/26/2014   Pneumococcal Polysaccharide-23 03/03/2021   Respiratory Syncytial Virus Vaccine ,Recomb Aduvanted(Arexvy ) 03/10/2022   Zoster Recombinant(Shingrix) 01/14/2019, 04/02/2019   Zoster, Live 05/22/2013, 04/01/2019    Past Medical History:  Diagnosis Date   Asthma    CAD (coronary artery disease)    Diverticulosis    Graves disease    s/p RAI   High  cholesterol    Hypertension    Hypothyroidism    Nephrolithiasis    Osteoporosis    Rheumatoid arthritis (HCC)    Vertigo     Tobacco History: Social History   Tobacco Use  Smoking Status Former   Current packs/day: 0.00   Average packs/day: 1 pack/day for 20.0 years (20.0 ttl pk-yrs)   Types: Cigarettes   Start date: 08/26/1988   Quit date: 08/26/2008   Years since quitting: 14.6  Smokeless Tobacco Never  Tobacco Comments   social smoker   Counseling given: Not Answered Tobacco comments: social smoker   Outpatient Medications Prior to Visit  Medication Sig Dispense Refill   abatacept  (ORENCIA ) 250 MG injection Inject 250 mg into the vein See admin instructions. Inject 250 mg IV once every month.     albuterol  (VENTOLIN  HFA) 108 (90 Base) MCG/ACT inhaler Inhale 1-2 puffs into the lungs every 6 (six) hours as needed for wheezing or shortness of breath. 8 g 5   aspirin  EC 81 MG tablet Take 1 tablet (81 mg total) by mouth daily. Swallow whole. 90 tablet 3   Budeson-Glycopyrrol-Formoterol  (BREZTRI  AEROSPHERE) 160-9-4.8 MCG/ACT AERO Inhale 2 puffs into the lungs in the morning and at bedtime.     cetirizine (ZYRTEC) 10 MG  tablet Take 10 mg by mouth at bedtime as needed for allergies.      diclofenac  Sodium (VOLTAREN ) 1 % GEL Apply 4 g topically 4 (four) times daily. 100 g 0   KLOR-CON  10 10 MEQ tablet Take 10 mEq by mouth daily.  0   levothyroxine  (SYNTHROID ) 50 MCG tablet Take 50 mcg by mouth daily.      montelukast (SINGULAIR) 10 MG tablet Take 10 mg by mouth daily.     Multiple Vitamin (MULTIVITAMIN) tablet Take 1 tablet by mouth daily.     Multiple Vitamins-Minerals (EQ MULTIVITAMINS ADULT GUMMY PO) Take 1 each by mouth daily.     nitroGLYCERIN  (NITROSTAT ) 0.4 MG SL tablet Place 1 tablet (0.4 mg total) under the tongue every 5 (five) minutes as needed for chest pain. 25 tablet 3   Probiotic Product (PROBIOTIC GUMMIES PO) Take by mouth daily as needed.     rosuvastatin  (CRESTOR )  10 MG tablet TAKE 1 TABLET BY MOUTH EVERY DAY 90 tablet 2   torsemide  (DEMADEX ) 20 MG tablet Take 1 tablet (20 mg total) by mouth daily. 90 tablet 3   Turmeric (QC TUMERIC COMPLEX PO) Take by mouth daily.     verapamil  (CALAN -SR) 240 MG CR tablet TAKE 1 TABLET BY MOUTH EVERY DAY 90 tablet 2   Vitamin D, Ergocalciferol, (DRISDOL) 1.25 MG (50000 UNIT) CAPS capsule Take 50,000 Units by mouth every 7 (seven) days. Monday     No facility-administered medications prior to visit.      Review of Systems  Review of Systems  Constitutional: Negative.   HENT:  Positive for postnasal drip.   Respiratory:  Negative for chest tightness, shortness of breath and wheezing.        Throat clearing cough   Physical Exam  BP 101/66 (BP Location: Right Arm, Patient Position: Sitting, Cuff Size: Large)   Pulse 62   Temp 97.7 F (36.5 C) (Oral)   Ht 5\' 3"  (1.6 m)   Wt 157 lb 3.2 oz (71.3 kg)   SpO2 97%   BMI 27.85 kg/m  Physical Exam Constitutional:      Appearance: Normal appearance.  HENT:     Head: Normocephalic and atraumatic.  Cardiovascular:     Rate and Rhythm: Normal rate and regular rhythm.  Pulmonary:     Effort: Pulmonary effort is normal.     Breath sounds: Normal breath sounds. No wheezing, rhonchi or rales.     Comments: 02 97% RA Neurological:     Mental Status: She is alert.      Lab Results:  CBC    Component Value Date/Time   WBC 11.1 (H) 02/28/2023 0847   RBC 4.16 02/28/2023 0847   HGB 12.2 02/28/2023 0847   HCT 37.8 02/28/2023 0847   PLT 256 02/28/2023 0847   MCV 90.9 02/28/2023 0847   MCH 29.3 02/28/2023 0847   MCHC 32.3 02/28/2023 0847   RDW 14.5 02/28/2023 0847   LYMPHSABS 1.9 02/28/2023 0847   MONOABS 1.1 (H) 02/28/2023 0847   EOSABS 0.1 02/28/2023 0847   BASOSABS 0.0 02/28/2023 0847    BMET    Component Value Date/Time   NA 141 02/28/2023 0847   NA 141 05/13/2019 1626   K 3.9 02/28/2023 0847   CL 106 02/28/2023 0847   CO2 30 02/28/2023 0847    GLUCOSE 106 (H) 02/28/2023 0847   BUN 15 02/28/2023 0847   BUN 26 05/13/2019 1626   CREATININE 0.79 02/28/2023 0847   CALCIUM  9.1 02/28/2023  0847   GFRNONAA >60 02/28/2023 0847   GFRAA >60 07/27/2019 1105    BNP    Component Value Date/Time   BNP 28.3 02/01/2017 2032    ProBNP No results found for: "PROBNP"  Imaging: No results found.   Assessment & Plan:   1. Moderate persistent asthma without complication (Primary) - POCT EXHALED NITRIC OXIDE   2. Rheumatoid arthritis involving multiple sites, unspecified whether rheumatoid factor present (HCC)  3. Postoperative atelectasis     Postoperative Atelectasis Patient experienced hypoxia post tympanic membrane repair surgery, likely secondary to atelectasis. No current respiratory distress or wheezing noted. Oxygen levels have normalized  -Continue use of incentive spirometer daily and bronchodilator regimen   Asthma Patient on Breztri  for asthma control. No current symptoms of asthma exacerbation. FeNO previously elevated at 50 while on Breztri . -Continue Breztri  twice daily. -Recheck FeNO today.  Rheumatoid Arthritis Patient on Orencia  for RA. Arthritis symptoms have improved. No current respiratory complaints. I will discuss previous concern expressed by Dr. Bertrum Brodie about potential lung effects of Orencia . -Continue Orencia  as prescribed. -Follow up with Dr. Ebbie Goldmann in February.  Upper Respiratory Congestion Patient reports recent upper respiratory congestion. Unclear if related to recent ear surgery. -Advise patient to contact Dr. Donalee Fruits regarding use of nasal sprays.  Follow-up Patient to follow up with Dr. Donalee Fruits at the end of the month and Dr. Ebbie Goldmann in February.      Antonio Baumgarten, NP 04/12/2023

## 2023-04-13 ENCOUNTER — Other Ambulatory Visit: Payer: Self-pay | Admitting: Cardiovascular Disease

## 2023-04-13 DIAGNOSIS — M0579 Rheumatoid arthritis with rheumatoid factor of multiple sites without organ or systems involvement: Secondary | ICD-10-CM | POA: Diagnosis not present

## 2023-04-13 NOTE — Telephone Encounter (Signed)
Thanks for information. Let her know to bring it up with Dr Dierdre Forth

## 2023-04-14 NOTE — Telephone Encounter (Signed)
Can you tell patient Dr. Debroah Baller told patient to discuss with Dr. Dierdre Forth, he will have access to our notes

## 2023-04-18 NOTE — Telephone Encounter (Signed)
Pt was informed and will discuss with Dr Dierdre Forth. Pt verbalized understanding. NFN.

## 2023-04-25 DIAGNOSIS — H7291 Unspecified perforation of tympanic membrane, right ear: Secondary | ICD-10-CM | POA: Diagnosis not present

## 2023-04-28 DIAGNOSIS — E785 Hyperlipidemia, unspecified: Secondary | ICD-10-CM | POA: Diagnosis not present

## 2023-04-28 DIAGNOSIS — E042 Nontoxic multinodular goiter: Secondary | ICD-10-CM | POA: Diagnosis not present

## 2023-04-28 DIAGNOSIS — E890 Postprocedural hypothyroidism: Secondary | ICD-10-CM | POA: Diagnosis not present

## 2023-04-28 DIAGNOSIS — I10 Essential (primary) hypertension: Secondary | ICD-10-CM | POA: Diagnosis not present

## 2023-05-02 DIAGNOSIS — M810 Age-related osteoporosis without current pathological fracture: Secondary | ICD-10-CM | POA: Diagnosis not present

## 2023-05-02 DIAGNOSIS — Z6826 Body mass index (BMI) 26.0-26.9, adult: Secondary | ICD-10-CM | POA: Diagnosis not present

## 2023-05-02 DIAGNOSIS — M25531 Pain in right wrist: Secondary | ICD-10-CM | POA: Diagnosis not present

## 2023-05-02 DIAGNOSIS — Z79899 Other long term (current) drug therapy: Secondary | ICD-10-CM | POA: Diagnosis not present

## 2023-05-02 DIAGNOSIS — M1991 Primary osteoarthritis, unspecified site: Secondary | ICD-10-CM | POA: Diagnosis not present

## 2023-05-02 DIAGNOSIS — R9389 Abnormal findings on diagnostic imaging of other specified body structures: Secondary | ICD-10-CM | POA: Diagnosis not present

## 2023-05-02 DIAGNOSIS — M0579 Rheumatoid arthritis with rheumatoid factor of multiple sites without organ or systems involvement: Secondary | ICD-10-CM | POA: Diagnosis not present

## 2023-05-02 DIAGNOSIS — E663 Overweight: Secondary | ICD-10-CM | POA: Diagnosis not present

## 2023-05-04 DIAGNOSIS — E785 Hyperlipidemia, unspecified: Secondary | ICD-10-CM | POA: Diagnosis not present

## 2023-05-04 DIAGNOSIS — J4540 Moderate persistent asthma, uncomplicated: Secondary | ICD-10-CM | POA: Diagnosis not present

## 2023-05-04 DIAGNOSIS — D126 Benign neoplasm of colon, unspecified: Secondary | ICD-10-CM | POA: Diagnosis not present

## 2023-05-04 DIAGNOSIS — I2510 Atherosclerotic heart disease of native coronary artery without angina pectoris: Secondary | ICD-10-CM | POA: Diagnosis not present

## 2023-05-04 DIAGNOSIS — I700 Atherosclerosis of aorta: Secondary | ICD-10-CM | POA: Diagnosis not present

## 2023-05-04 DIAGNOSIS — R002 Palpitations: Secondary | ICD-10-CM | POA: Diagnosis not present

## 2023-05-04 DIAGNOSIS — M060A Rheumatoid arthritis without rheumatoid factor, other specified site: Secondary | ICD-10-CM | POA: Diagnosis not present

## 2023-05-04 DIAGNOSIS — E890 Postprocedural hypothyroidism: Secondary | ICD-10-CM | POA: Diagnosis not present

## 2023-05-04 DIAGNOSIS — E042 Nontoxic multinodular goiter: Secondary | ICD-10-CM | POA: Diagnosis not present

## 2023-05-04 DIAGNOSIS — I10 Essential (primary) hypertension: Secondary | ICD-10-CM | POA: Diagnosis not present

## 2023-05-04 DIAGNOSIS — M810 Age-related osteoporosis without current pathological fracture: Secondary | ICD-10-CM | POA: Diagnosis not present

## 2023-05-11 DIAGNOSIS — M0579 Rheumatoid arthritis with rheumatoid factor of multiple sites without organ or systems involvement: Secondary | ICD-10-CM | POA: Diagnosis not present

## 2023-05-17 ENCOUNTER — Ambulatory Visit: Payer: Medicare Other | Admitting: Internal Medicine

## 2023-06-09 DIAGNOSIS — M0579 Rheumatoid arthritis with rheumatoid factor of multiple sites without organ or systems involvement: Secondary | ICD-10-CM | POA: Diagnosis not present

## 2023-06-13 DIAGNOSIS — M0579 Rheumatoid arthritis with rheumatoid factor of multiple sites without organ or systems involvement: Secondary | ICD-10-CM | POA: Diagnosis not present

## 2023-06-16 DIAGNOSIS — M25551 Pain in right hip: Secondary | ICD-10-CM | POA: Diagnosis not present

## 2023-06-26 ENCOUNTER — Emergency Department (HOSPITAL_BASED_OUTPATIENT_CLINIC_OR_DEPARTMENT_OTHER): Admitting: Radiology

## 2023-06-26 ENCOUNTER — Encounter (HOSPITAL_BASED_OUTPATIENT_CLINIC_OR_DEPARTMENT_OTHER): Payer: Self-pay | Admitting: Emergency Medicine

## 2023-06-26 ENCOUNTER — Emergency Department (HOSPITAL_BASED_OUTPATIENT_CLINIC_OR_DEPARTMENT_OTHER)
Admission: EM | Admit: 2023-06-26 | Discharge: 2023-06-26 | Disposition: A | Discharge status: Home / Self Care | Attending: Emergency Medicine | Admitting: Emergency Medicine

## 2023-06-26 ENCOUNTER — Other Ambulatory Visit (HOSPITAL_BASED_OUTPATIENT_CLINIC_OR_DEPARTMENT_OTHER): Payer: Self-pay

## 2023-06-26 ENCOUNTER — Other Ambulatory Visit: Payer: Self-pay

## 2023-06-26 DIAGNOSIS — E039 Hypothyroidism, unspecified: Secondary | ICD-10-CM | POA: Diagnosis not present

## 2023-06-26 DIAGNOSIS — I10 Essential (primary) hypertension: Secondary | ICD-10-CM | POA: Insufficient documentation

## 2023-06-26 DIAGNOSIS — J45909 Unspecified asthma, uncomplicated: Secondary | ICD-10-CM | POA: Insufficient documentation

## 2023-06-26 DIAGNOSIS — R079 Chest pain, unspecified: Secondary | ICD-10-CM | POA: Diagnosis present

## 2023-06-26 DIAGNOSIS — I2510 Atherosclerotic heart disease of native coronary artery without angina pectoris: Secondary | ICD-10-CM | POA: Insufficient documentation

## 2023-06-26 DIAGNOSIS — X501XXA Overexertion from prolonged static or awkward postures, initial encounter: Secondary | ICD-10-CM | POA: Insufficient documentation

## 2023-06-26 DIAGNOSIS — S2231XA Fracture of one rib, right side, initial encounter for closed fracture: Secondary | ICD-10-CM | POA: Insufficient documentation

## 2023-06-26 DIAGNOSIS — R0789 Other chest pain: Secondary | ICD-10-CM | POA: Diagnosis not present

## 2023-06-26 MED ORDER — OXYCODONE-ACETAMINOPHEN 5-325 MG PO TABS
1.0000 | ORAL_TABLET | Freq: Four times a day (QID) | ORAL | 0 refills | Status: DC | PRN
  Filled 2023-06-26: qty 15, 4d supply, fill #0

## 2023-06-26 MED ORDER — KETOROLAC TROMETHAMINE 30 MG/ML IJ SOLN
30.0000 mg | Freq: Once | INTRAMUSCULAR | Status: AC
  Administered 2023-06-26: 30 mg via INTRAMUSCULAR
  Filled 2023-06-26: qty 1

## 2023-06-26 MED ORDER — SENNOSIDES-DOCUSATE SODIUM 8.6-50 MG PO TABS
1.0000 | ORAL_TABLET | Freq: Every evening | ORAL | 0 refills | Status: DC | PRN
Start: 2023-06-26 — End: 2023-07-25
  Filled 2023-06-26: qty 20, 20d supply, fill #0

## 2023-06-26 NOTE — ED Triage Notes (Signed)
 Pt c/o RT side CP under breast radiating to RT flank. Endorses lifting box and feeling "pop". Endorses difficulty with deep inspiration. 600 mg ibuprofen at 0500

## 2023-06-26 NOTE — ED Provider Notes (Signed)
 Emergency Department Provider Note   I have reviewed the triage vital signs and the nursing notes.   HISTORY  Chief Complaint Chest Pain   HPI Brandy Townsend is a 73 y.o. female with past history reviewed below including RA presents to the emergency department with right side chest and flank pain after lifting a heavy box.  She states she was lifting the box when she suddenly felt a "pop" in the right chest followed by severe pain.  Pain is significantly worse with touch, movement, deep breathing.  Notes she had a similar presentation several months ago but on the contralateral side.  She does take it ibuprofen and methocarbamol without relief.   Past Medical History:  Diagnosis Date   Asthma    CAD (coronary artery disease)    Diverticulosis    Graves disease    s/p RAI   High cholesterol    Hypertension    Hypothyroidism    Nephrolithiasis    Osteoporosis    Rheumatoid arthritis (HCC)    Vertigo     Review of Systems  Constitutional: No fever/chills Cardiovascular: Positive chest pain. Respiratory: Denies shortness of breath. Gastrointestinal: No abdominal pain.  No nausea, no vomiting.  No diarrhea.  No constipation. Musculoskeletal: Positive chest wall pain.  Skin: Negative for rash. Neurological: Negative for headaches.  ____________________________________________   PHYSICAL EXAM:  VITAL SIGNS: ED Triage Vitals  Encounter Vitals Group     BP 06/26/23 0731 116/61     Pulse Rate 06/26/23 0731 70     Resp 06/26/23 0731 16     Temp 06/26/23 0731 97.8 F (36.6 C)     Temp src --      SpO2 06/26/23 0731 99 %     Weight 06/26/23 0728 149 lb (67.6 kg)   Constitutional: Alert and oriented. Well appearing and in no acute distress. Eyes: Conjunctivae are normal.  Head: Atraumatic. Nose: No congestion/rhinnorhea. Mouth/Throat: Mucous membranes are moist. Neck: No stridor.   Cardiovascular: Normal rate, regular rhythm. Good peripheral circulation. Grossly  normal heart sounds.   Respiratory: Normal respiratory effort.  No retractions. Lungs CTAB. Gastrointestinal: Soft and nontender. No distention.  Musculoskeletal: No lower extremity tenderness nor edema. No gross deformities of extremities.  Focal tenderness through the right lateral, inferior chest wall without crepitus or bruising.  No overlying rash. Neurologic:  Normal speech and language. No gross focal neurologic deficits are appreciated.  Skin:  Skin is warm, dry and intact. No rash noted.  ____________________________________________  RADIOLOGY  No results found.  ____________________________________________   PROCEDURES  Procedure(s) performed:   Procedures   ____________________________________________   INITIAL IMPRESSION / ASSESSMENT AND PLAN / ED COURSE  Pertinent labs & imaging results that were available during my care of the patient were reviewed by me and considered in my medical decision making (see chart for details).   This patient is Presenting for Evaluation of CP, which does require a range of treatment options, and is a complaint that involves a moderate risk of morbidity and mortality.  The Differential Diagnoses include rib fracture, contusion, muscle strain, pneumothorax, thoracic radiculopathy, etc.  Critical Interventions-    Medications  ketorolac (TORADOL) 30 MG/ML injection 30 mg (has no administration in time range)    Reassessment after intervention: pain improved  Radiologic Tests Ordered, included CXR. I independently interpreted the images and agree with radiology interpretation.   Medical Decision Making: Summary:  The patient presents emergency department with right chest wall pain.  Pain  is very easy to reproduce with palpation and movement.  Exceedingly low suspicion for ACS, PE, other intrathoracic etiology for pain.  Plan for chest x-ray, Toradol, reassess.  Reevaluation with update and discussion with patient.  Discussed rib  fracture diagnosis.  She has an incentive spirometer at home and uses it regularly.  She declines additional incentive spirometer here.  ***Considered admission***  Patient's presentation is most consistent with acute presentation with potential threat to life or bodily function.   Disposition:   ____________________________________________  FINAL CLINICAL IMPRESSION(S) / ED DIAGNOSES  Final diagnoses:  None     NEW OUTPATIENT MEDICATIONS STARTED DURING THIS VISIT:  New Prescriptions   No medications on file    Note:  This document was prepared using Dragon voice recognition software and may include unintentional dictation errors.  Alona Bene, MD, [facility]

## 2023-06-26 NOTE — Discharge Instructions (Signed)
Your workup today showed that you have a fracture to one or more ribs.  Unfortunately this type of injury hurts but there is no way to fix it immediately; it must heal over time.  Be sure to take plenty of deep breaths so that you get rid of the "bad air" in your lungs.  If you are given a device called an incentive spirometer, please use it as recommended.  Unless you have been told by your doctor not to do so, we recommend you take ibuprofen 600 mg 3 times daily with meals for no more than 5 days.  You can also take Tylenol 1000 mg every 6 hours for pain.  Follow-up at the clinics or with the doctors described in this paperwork.  Return to the emergency department if he develop new or worsening symptoms that concern you.   

## 2023-06-27 DIAGNOSIS — M069 Rheumatoid arthritis, unspecified: Secondary | ICD-10-CM | POA: Diagnosis not present

## 2023-06-27 DIAGNOSIS — S2239XA Fracture of one rib, unspecified side, initial encounter for closed fracture: Secondary | ICD-10-CM | POA: Diagnosis not present

## 2023-06-27 DIAGNOSIS — M25551 Pain in right hip: Secondary | ICD-10-CM | POA: Diagnosis not present

## 2023-06-27 DIAGNOSIS — J45909 Unspecified asthma, uncomplicated: Secondary | ICD-10-CM | POA: Diagnosis not present

## 2023-06-28 ENCOUNTER — Telehealth: Payer: Self-pay | Admitting: *Deleted

## 2023-06-28 NOTE — Telephone Encounter (Signed)
 Copied from CRM [id number]. Topic: Appointments - Scheduling Inquiry for Clinic >> Jun 26, 2023 11:53 AM Nila Nephew wrote: Reason for CRM: Patient is calling to state that she has broken a few ribs and would like to know if she needs to reschedule her upcoming appointment. Patient is concerned she will not be able to do a breathing test. Please advise patient.  I called and spoke with the pt  I advised that she keep her ov with MR  There is not a breathing test scheduled for her

## 2023-06-29 ENCOUNTER — Ambulatory Visit: Payer: Medicare Other | Admitting: Internal Medicine

## 2023-06-29 ENCOUNTER — Encounter: Payer: Self-pay | Admitting: Internal Medicine

## 2023-06-29 VITALS — BP 128/74 | HR 74 | Ht 63.0 in | Wt 151.0 lb

## 2023-06-29 DIAGNOSIS — R918 Other nonspecific abnormal finding of lung field: Secondary | ICD-10-CM

## 2023-06-29 DIAGNOSIS — H7291 Unspecified perforation of tympanic membrane, right ear: Secondary | ICD-10-CM | POA: Diagnosis not present

## 2023-06-29 DIAGNOSIS — J4540 Moderate persistent asthma, uncomplicated: Secondary | ICD-10-CM | POA: Diagnosis not present

## 2023-06-29 DIAGNOSIS — Z87891 Personal history of nicotine dependence: Secondary | ICD-10-CM

## 2023-06-29 DIAGNOSIS — M069 Rheumatoid arthritis, unspecified: Secondary | ICD-10-CM

## 2023-06-29 DIAGNOSIS — S2231XA Fracture of one rib, right side, initial encounter for closed fracture: Secondary | ICD-10-CM

## 2023-06-29 MED ORDER — BREZTRI AEROSPHERE 160-9-4.8 MCG/ACT IN AERO: 2.0000 | INHALATION_SPRAY | Freq: Two times a day (BID) | RESPIRATORY_TRACT | Status: AC

## 2023-06-29 NOTE — Progress Notes (Signed)
 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 Juleen China, intermittent chest tightness, no wheezing or cough   wheezng started roughly the same year she quit smoking assoc with wt gain x 35lb wt gain typically better p prednisone and better since spring 2017  On asthmanex 2 each am with rare saba needed but then Feb 10 2016 chest tightness and did not try saba > ER 02/21/16 with w/u neg w/u except for a/f levels both max sinuses> Rx Rosen= prednsione > back to baseline at time of ov with no cough or sob or chest tightness and  Not limited by breathing from desired activities including treadmill at 3.5 mph flat x 1.5 miles  Other than assoc of flares with cold/sinus infections >> No obvious patterns in day to day or daytime variability or assoc excess/ purulent sputum or mucus plugs or hemoptysis or cp or chest tightness, subjective wheeze or overt   hb symptoms. No unusual exp hx or h/o childhood pna/ asthma or knowledge of premature birth. 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  No new complaints. 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 She has not had to use her albuterol inhaler at all and has only been using the symbicort 1-2 puffs every am.   symb 80 1-2 each am at most never using the 2 bid max  Not limited by breathing from desired activities   No cough   No obvious day to day or daytime variability  or assoc excess/ purulent sputum or mucus plugs or hemoptysis or cp or chest tightness, subjective wheeze or overt sinus or hb symptoms. No unusual exp hx or h/o childhood pna/ asthma or knowledge of premature birth.  Sleeping ok flat without nocturnal  or early am exacerbation  of respiratory  c/o's or need for noct saba. Also denies any obvious fluctuation of symptoms with weather or environmental changes or other aggravating or alleviating factors except as outlined above   Current Allergies, Complete Past Medical History, Past Surgical History, Family History, and Social History were reviewed in [name] record.  ROS  The following are not active complaints unless bolded Hoarseness, sore throat, dysphagia, dental problems, itching, sneezing,  nasal congestion or discharge of excess mucus or purulent secretions,  ear ache,   fever, chills, sweats, unintended wt loss or wt gain, classically pleuritic or exertional cp,  orthopnea pnd or leg swelling, presyncope, palpitations, abdominal pain, anorexia, nausea, vomiting, diarrhea  or change in bowel habits or change in bladder habits, change in stools or change in urine, dysuria, hematuria,  rash, arthralgias, visual complaints, headache, numbness, weakness or ataxia or problems with walking or coordination,  change in mood/affect or memory.       OV 08/29/2019  Subjective:  Patient ID: Brandy Townsend, female , DOB: 11-Oct-1950 , age 35 y.o. , MRN: [id number] , ADDRESS: [address] [place] [address]   08/29/2019 -   Chief Complaint  Patient presents with   Consult    Abnormal CT, hx of asthma. hx of low oxygen level and cough.    Transfer of care from Dr. Sandrea Hughs to Dr. Marchelle Gearing.  HPI Brandy Townsend 73 y.o. -referred for possible abnormalities in the CT scan of the chest in April 2021 in the setting of rheumatoid arthritis 2018 (RF + and CCP > 250 per Elpidio Anis over phone) and being on [place] and longstanding history of allergic asthma on dulera. 20 pack former smoking   73 year old female.  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  She reports a many year many decade history of allergic asthma which flares up in the fall.  Between episodes she is not on any maintenance treatment.  In the fall when it flares up her previous primary care physician Dr. Shawna Clamp retired] would give her antibiotics and prednisone one course and this would help it.  However in 2020 when the flareup happened she could not get access to the prednisone and antibiotics because the primary care physician retired and in the setting of Covid it was televisit.  By the end of 2020 she ended up with 2 rounds of antibiotics and prednisone that helped clear things up.  In the interim in the last 4 years she has had a diagnosis of  rheumatoid arthritis.  She was on methotrexate but had raised liver function test.  This seemed to help.  She failed Plaquenil and then was started on leflunomide which she is on currently.   In 2018 she did have spirometry and DLCO on pulmonary function testing that shows hyperinflation and air trapping with reduced DLCO but also normal spirometry.  In the backdrop of this in January 2020 when she underwent her first Covid vaccine.  Then in early February 2020 when she had a second Covid vaccine.  24 hours later she got hypotensive tachycardic and also hypoxemic and ended up in the ER.  I reviewed the ER notes.  She was observed in the ER for whole day and her symptoms and signs resolved and the hypoxemia also resolved.  She was discharged with a prednisone course.  She says subsequent to that she was short of breath and fatigue for 2 weeks and this also resolved.  She did see cardiology in February 2021.  She was reassured by a normal stress test.  After that she switched primary care physicians to Dr. Adrian Prince.  She is now established with a new rheumatologist at [facility] and is seen Elpidio Anis the physician's assistant for the first time on August 27, 2019 2 days ago.  She also had a thyroid biopsy which was nondiagnostic.  On the rheumatology visit 2 days ago it was felt her rheumatoid arthritis was active.  Blood has been drawn and work-up is in progress.  Through all this primary care physician did do CT chest without contrast.  I personally visualized this.  There is evidence of tree-in-bud that is mild and subtle.  Therefore she is referred here.  However at this point in time she is asymptomatic from a respiratory standpoint no wheezing no orthopnea no proximal nocturnal dyspnea no dyspnea on exertion no cough.  However in terms of rheumatoid arthritis there is plans to increase her immunomodulation. She has been started on low dose pred now by Elpidio Anis. Currently labs  pending are CRP, Quant Gold and Hep Panel   D/w Elpidio Anis later -> she is considering adding DMARD -discussed and she is supportive of getting bronch and rule out opportunistic infection  ROS - per HPI  IMPRESSION: CT chest visualized 1. Very mild, stable tree-in-bud appearing opacities along the anterolateral aspect of the bilateral upper lobes. Sequelae associated with atypical infection cannot be excluded. 2. Predominant stable thyroid goiter.   Aortic Atherosclerosis (ICD10-I70.0).     Electronically Signed   By: Aram Candela M.D.   On: 07/22/2019 22:34    OV 10/17/2019  Subjective:  Patient ID: Brandy Townsend, female , DOB: 02/07/1951 , age 77 y.o. , MRN: [id number] , ADDRESS: [address] [place] [address]   10/17/2019 -   Chief Complaint  Patient presents with   Follow-up    Pt is here to discuss results of the bronch. Pt states she has been doing good since last visit and denies any complaints.     HPI Brandy Townsend 73 y.o. -returns for follow-up.  At this point in time she is on prednisone 20 mg/day.  She has gained weight.  She is got some bruises.  She is not happy about her prednisone.  She denies any respiratory complaints.  Mid June 2020 when she underwent bronchoscopy with lavage.  I reviewed the results.  She is got some mild neutrophilia but organisms such as PCP and TB and fungus are negative.  She really wants to get started on a DMA RD so she can come down or get off prednisone.  I discussed with rheumatology PA Elpidio Anis and she is of the understanding.  I stated there is no ILD.  They are going to focus on joint treatment.  Patient did indicate that in the fall she is at risk for respiratory exacerbations.  But she lives nearby and is agreeable to a 9-3-month follow-up.  She is supposed to get pulmonary function test today but that was not scheduled by our office.  Results for CURTISTINE, PETTITT (MRN [id number]) as of 10/17/2019 16:38  Ref.  Range  09/12/2019 08:59  Monocyte-Macrophage-Serous Fluid Latest Ref Range: 50 - 90 % 38 (L)  Other Cells, Fluid Latest Units: % CORRELATE WITH CYTOLOGY.  Fluid Type-FCT Unknown Bronch Lavag  Color, Fluid Latest Ref Range: YELLOW  COLORLESS (A)  Total Nucleated Cell Count, Fluid Latest Ref Range: 0 - 1,000 cu mm 15  Lymphs, Fluid Latest Units: % 4  Eos, Fluid Latest Units: % 2  Appearance, Fluid Latest Ref Range: CLEAR  CLOUDY (A)  Neutrophil Count, Fluid Latest Ref Range: 0 - 25 % 56 (H)   ROS - per HPI   Patient ID: Brandy Townsend, female    DOB: February 28, 1951, 73 y.o.   MRN: [id number]  Chief Complaint  Patient presents with   Follow-up    Cough     Referring provider: Adrian Prince, MD    12/10/2019 Acute OV : Cough  HPI: 73 year old female former smoker followed for abnormal CT chest with pulmonary infiltrates Medical history significant for rheumatoid arthritis    Patient presents for an acute office visit.  She complains of 1 week increased cough,congestion , and wheezing . Mucus is clear , hard to cough anything up.  She has underlying rheumatoid arthritis and is recently had her maintenance regimen changed to methotrexate in infliximab.    She had recent Covid test has been negative.  Her Covid vaccines are up-to-date.  She denies any fever or loss of taste or smell.   Patient is being followed for mild pulmonary infiltrates noted on CT chest.  She is negative for interstitial lung disease.  She underwent bronchoscopy in June 2021 that showed negative cultures and BAL.  Patient is followed by rheumatology and is on methotrexate and Renflexis     TEST/EVENTS :  In 2018 she did have spirometry and DLCO on pulmonary function testing that shows hyperinflation and air trapping with reduced DLCO but also normal spirometry.  CT chest July 22, 2019 showed very mild stable tree-in-bud appearing opacities along the bilateral upper lobes.  Bronchoscopy June 2021 showed cytology  positive for benign bronchial cells and pulmonary macrophages.,  AFB negative, BAL negative, M tubercular goes complex negative, fungal negative PCP negative   OV 01/23/2020  Subjective:  Patient ID: Brandy Townsend, female , DOB: [birth year] , age 89 y.o. , MRN: [id number] , ADDRESS: [address] [place] [address] PCP Adrian Prince, MD Patient Care Team: Adrian Prince, MD as PCP - General (Endocrinology) Annamaria Helling, MD as PCP - OBGYN (Obstetrics and Gynecology) Wendall Stade, MD as PCP - Cardiology (Cardiology)  This Provider for this visit: Treatment Team:  Attending Provider: Kalman Shan, MD    01/23/2020 -   Chief Complaint  Patient presents with   Follow-up    Pt states she has been doing okay since last visit. States her breathing has been doing okay. Still becomes SOB but it happens out of nowhere and she isn't sure why or if it is coming due to her heart.   Rheumatoid arthritis with immunosuppression and mild pulmonary infiltrates.  BAL with neutrophilia blood culture negative-June 2021  Long history of allergic asthma previously on Dulera.  HPI Brandy Townsend 73 y.o. -returns for follow-up.  Since last seeing me she is seen nurse practitioner in September 2021.  At that time she was wheezing.  She was given Depo-Medrol and outpatient Omnicef.  She says within a few days she responded to this.  Since then she feels back to baseline but she says even at baseline  she is short of breath with exertion relieved by rest.  She says is not normal for her.  She recently had a cardiac cath and she reports this is normal.  I reviewed the chart and agree with that.  She is also dealing with her husband has esophageal cancer.  Recently was in the ER because of' false Hyperkalemia according to history.  She is currently on TNF alpha blockade and methotrexate for her rheumatoid arthritis.  She says the joint symptoms have not fully improved.  She is waiting for the third  dose of the TNF alpha blockade before she is expecting to show improvement in her symptoms.  Overall the last few months have been high level of social stress.  She thinks it is slowly settling down.  She feels like things are changed after getting her Covid vaccine.  Nevertheless she understands she is immunosuppressed and she is in need of a booster.  She has some trepidation about getting the booster.  She is willing to have a IgG antibody levels checked as a means of triage her understanding antibody response to previous Covid vaccine.  Today she pulmonary function test in my view shows obstruction with significant bronchodilator response consistent with an asthma pattern.  Documented below.  I reviewed the graph.  Nitric oxide test and asthma control test scores are listed below and shows disease activity   Asthma Control Test ACT Total Score  01/23/2020 18  08/29/2019 24     Lab Results  Component Value Date   NITRICOXIDE 34 01/23/2020      OV 03/04/2021  Subjective:  Patient ID: Brandy Townsend, female , DOB: [birth year] , age 3 y.o. , MRN: [id number] , ADDRESS: [address] [place] [address] PCP Adrian Prince, MD Patient Care Team: Adrian Prince, MD as PCP - General (Endocrinology) Annamaria Helling, MD as PCP - OBGYN (Obstetrics and Gynecology) Wendall Stade, MD as PCP - Cardiology (Cardiology)  This Provider for this visit: Treatment Team:  Attending Provider: Kalman Shan, MD    03/04/2021 -   Chief Complaint  Patient presents with   Follow-up    No new concerns     HPI Brandy Townsend 74 y.o. -returns for follow-up.  She tells me overall her asthma is doing well.  In September/October 2022 Dr. Brynda Peon added Singulair.  She was referred to allergist.  Work-up there has been reassuring.  She would not plan to go back to allergy clinic again.  She saw Dr. Marshall Callas.  She feels the [facility] and Singulair have helped her.  She wants a refill on her albuterol.   Her ACT control score shows improvement.  However her exhaled nitric oxide score is 55 and is borderline high.  However she is not feeling it.  Of note she had a fourth COVID mRNA booster vaccine against delta and oh micron.  However 2 days after that she got significant side effects of joint flare and like fatigue.  She does not want to do the mRNA vaccine again.  This is second time she has had significant side effects.  She needed prednisone this time.  After the second shot she ended up in the ER with tachycardia.  I have advised her that mRNA vaccine should be listed as allergy.  She is agreeable.     OV 06/01/2021  Subjective:  Patient ID: Brandy Townsend, female , DOB: December 08, 1950 , age 8 y.o. , MRN: [id number] , ADDRESS: [address]  [place] [address] PCP Adrian Prince, MD Patient Care Team: Adrian Prince, MD as PCP - General (Endocrinology) Annamaria Helling, MD as PCP - OBGYN (Obstetrics and Gynecology) Wendall Stade, MD as PCP - Cardiology (Cardiology)  This Provider for this visit: Treatment Team:  Attending Provider: Kalman Shan, MD   06/01/2021 -   Chief Complaint  Patient presents with   Acute Visit    Pt has had complaints of cough and chest tightness which began overnight 3/6.    Rheumatoid arthritis with immunosuppression and mild pulmonary infiltrates.  BAL with neutrophilia blood culture negative-June 2021  Long history of allergic asthma HPI Brandy Townsend 73 y.o. -this is an acute visit.  She tells me that a few weeks ago she had cough and it went away.  Then she went to [place].  She has returned and has been doing well.  No sick contacts.  Then all of a sudden she woke up in the middle of the night with chest pain.  It was as though there was some sharp sensation in the sternal area.  She reports history of normal stress test within a year.  She did have normal troponins in 2022 and 2021.  Review of the records indicate low risk cardiac stress  test February 2021.  Cardiac cath in October 2021 showed nonobstructive coronary artery disease with moderate disease in the first OM.  But in any event later she started having significant cough with green sputum.  She is also short of breath and fatigue.  The chest pain was associated with the cough.     12/02/2021 Follow up : Asthma  Patient presents for a 36-month follow-up.  Patient has moderate persistent asthma with an allergic phenotype.  She says overall she is doing very well.  Asthma is been under good control.  She denies any flare of cough or wheezing.  ACT score today is 24.  Exhaled nitric oxide testing is at 62 ppb.  She remains on Breo inhaler daily.  She takes Zyrtec and Singulair daily. No increased albuterol use .   She does have rheumatoid arthritis followed by rheumatology on Orencia. Did have flare this summer treated with steroids , none since.   Very active.  Caregiver for her husband. Husband has had esophageal cancer, has feeding tube. Goes in and out of hospital.  Has went back to work partime , 1 day a week. At [employer] .  This is known to cause asthma exacerbation.  Her maintenance medication for asthma includes Breo and Singulair  She continues to deal with easy bruising on her forearms and arms that are pressure related.  She says it is idiopathic.  She believes her platelets are normal.   OV 06/07/2022  Subjective:  Patient ID: Brandy Townsend, female , DOB: 04-20-1950 , age 30 y.o. , MRN: [id number] , ADDRESS: [address] [place] [address] PCP Adrian Prince, MD Patient Care Team: Adrian Prince, MD as PCP - General (Endocrinology) Annamaria Helling, MD as PCP - OBGYN (Obstetrics and Gynecology) Wendall Stade, MD as PCP - Cardiology (Cardiology)  This Provider for this visit: Treatment Team:  Attending Provider: Kalman Shan, MD    06/07/2022 -   Chief Complaint  Patient presents with   Follow-up    Asthma well controlled.   Needs refill on Breo inhaler.  Has not used Breo in about a month.     HPI Brandy Townsend 73 y.o. -returns for follow-up.  She last saw me personally in May 2023.  Then in September [address] Tammy Parrott.  At the time the exam nitric oxide was slightly high.  Then after Christmas 2023 she had influenza and got another round of prednisone.  Currently she is doing well.  She ran out of Breo a month ago and she still doing well ACT scores are at baseline.  She is on Singulair but she is out of [place].  She is wondering whether she should restart the [facility] or not. FENO IS [id number]  Social - Husband who is esophageal cancer and has a feeding tube is now getting at least 350 cal orally.  He is eating peaches and cheese crackers. - Daughter is in [place] -currently she is safe    OV 01/13/2023  Subjective:  Patient ID: Brandy Townsend, female , DOB: 04-17-1950 , age 37 y.o. , MRN: [id number] , ADDRESS: [address] [place] [address] PCP Adrian Prince, MD Patient Care Team: Adrian Prince, MD as PCP - General (Endocrinology) Annamaria Helling, MD as PCP - OBGYN (Obstetrics and Gynecology) Wendall Stade, MD as PCP - Cardiology (Cardiology)  This Provider for this visit: Treatment Team:  Attending Provider: Kalman Shan, MD    01/13/2023 -   Chief Complaint  Patient presents with   Follow-up    Breathing is overall doing well. She has sneezing and cough with clear sputum in the mornings. FENO= 66 today.      HPI Brandy Townsend 73 y.o. -returns for follow-up.  I saw her last in the spring 2024.  After that overall she was stable but December 18, 2022 she returned to the emergency department with multiple acute complaints.  I personally reviewed the external records.  Visualized the CT scan and the blood work.  Diagnose of acute bronchitis based on CT scan findings pulmonary embolism ruled out.  Right upper lobe tree-in-bud infiltrates noted.  But I do not appreciate that.  She says  she was having head numbness shortness of breath chest pressure but no cough or wheezing.  She was treated with antibiotic and she is at baseline.  She feels great ACT score is good but nitric oxide is actually gone up from her baseline of 50 to greater than 60.  She is surprised by this.  We she is interested in therapeutic options she is worried her asthma would be getting out of control.  Review of the labs indicate she always has high elevated baseline eosinophils.  Skin allergy testing with Dr. [place] Callas was positive some years ago  Of note for the last 2 years she is on Orencia which is known to cause respiratory exacerbations particularly in COPD patients.  She did have a recent infusion of that.  She thought her MCHC in the blood work was low but at least in September in our records it was normal and I showed her this.    FeNO 50    04/12/2023- Interim hx  Patient presents today for 6 week follow-up.   Discussed the use of AI scribe software for clinical note transcription with the patient, who gave verbal consent to proceed.  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 This was her first experience with general anesthesia. Post-surgery, the patient reported no pain but took prescribed hydrocodone as a precaution. Approximately 30 minutes later, she woke up with discomfort  in the back of her head. Upon checking her oxygen level, it was found to be at 80. After using her rescue inhaler twice, the level increased to 84, but she was advised to go to the ER.  At the ER, the patient's oxygen level fluctuated, dropping low but rebounding quickly. She was discharged the next day, but her oxygen levels remained lower than usual, staying around 93-94. The patient has not experienced levels below 92 since the surgery.  In the week following the surgery, the patient reported  feeling like she developed a kidney stone, had a stomach bug, and picked up a congestion bug from an ER visit with her husband. Despite these issues, her oxygen levels have not dropped below 93.  The patient has been using an incentive spirometer almost daily since the surgery. She also reported a cough, but it is not producing anything and she believes it is more related to postnasal drip. The patient has been using Breztri for her asthma and her nitric oxide levels have decreased from 60 to 50 since starting this medication.  The patient also has a history of rheumatoid arthritis and has been on Orencia for two and a half years. She reported that her hand pain, which was previously severe, has significantly improved since switching her turmeric supplement in mid-October. She has a little hip pain, but no other pain.        OV 06/29/2023  Subjective:  Patient ID: Brandy Townsend, female , DOB: June 08, 1950 , age 17 y.o. , MRN: [id number] , ADDRESS: [address] [place] [address] PCP Adrian Prince, MD Patient Care Team: Adrian Prince, MD as PCP - General (Endocrinology) Annamaria Helling, MD as PCP - OBGYN (Obstetrics and Gynecology) Wendall Stade, MD as PCP - Cardiology (Cardiology)  This Provider for this visit: Treatment Team:  Attending Provider: Kalman Shan, MD    06/29/2023 -   Chief Complaint  Patient presents with   Follow-up    Breathing has overall been stable. She fractured rib picking up a heavy box a few days ago.    Rheumatoid arthritis with immunosuppression and mild pulmonary infiltrates.  BAL with neutrophilia blood culture negative-June 2021  Long history of allergic asthma previously on Dulera.   HPI Brandy Townsend 73 y.o. -returns for follow-up.  From an asthma perspective she is doing well.  She has tympanoplasty and she is recovering although during the tympanoplasty she says she desaturated.  The acute issue is that 1 her breast tree which works  really well for his very expensive her insurance coverage this week so I gave her 2 months worth of samples giving her time to inquire with insurance about an alternative.  In addition on March 30/2025 she is picking up somewhat of a heavy box but she has been doing this for 5 years.  Contains liquid drinks for her husband who has a PEG tube.  Later that evening she had a pop and since then excruciating pain.  She has a right rib 10 fracture.  She is communicating with the primary care physician about it.  Ends of note in September 2024 showed pulm infiltrates on the CT and this requires a 1 year follow-up.  She continues to be immunosuppressed.  Otherwise no new issues.   PFT     Latest Ref Rng & Units 01/23/2020    8:43 AM 01/13/2017    8:40 AM  PFT Results  FVC-Pre L 2.08  2.43   FVC-Predicted Pre %  69  79   FVC-Post L 2.41  2.46   FVC-Predicted Post % 80  80   Pre FEV1/FVC % % 72  73   Post FEV1/FCV % % 75  76   FEV1-Pre L 1.51  1.77   FEV1-Predicted Pre % 66  75   FEV1-Post L 1.81  1.87   DLCO uncorrected ml/min/mmHg 18.49  16.68   DLCO UNC% % 97  72   DLCO corrected ml/min/mmHg 18.38  17.68   DLCO COR %Predicted % 96  77   DLVA Predicted % 108  83   TLC L 5.38  5.90   TLC % Predicted % 109  120   RV % Predicted % 142  162        LAB RESULTS last 96 hours DG Ribs Unilateral W/Chest Right Result Date: 06/26/2023 CLINICAL DATA:  Chest wall pain. EXAM: RIGHT RIBS AND CHEST - 3+ VIEW COMPARISON:  Radiographs 02/28/2023 and 02/22/2023. Abdominal CT 01/16/2023. Chest CT 12/18/2022. FINDINGS: The heart size and mediastinal contours are stable. Stable mild chronic central airway thickening and emphysematous changes. There is no edema, confluent airspace disease, pleural effusion or pneumothorax. No displaced acute rib fractures are identified. On the oblique view, there is mild irregularity of the anterior aspect of the right 10th rib near the BB indicating the area of pain, suspicious  for an acute nondisplaced fracture. Thoracolumbar scoliosis noted. IMPRESSION: 1. Suspected acute nondisplaced fracture of the anterior aspect of the right 10th rib. Correlate with area of pain. No displaced rib fractures or pneumothorax. 2. No evidence of pneumothorax, pleural effusion or other acute cardiopulmonary process. Electronically Signed   By: Carey Bullocks M.D.   On: 06/26/2023 09:50         has a past medical history of Asthma, CAD (coronary artery disease), Diverticulosis, Graves disease, High cholesterol, Hypertension, Hypothyroidism, Nephrolithiasis, Osteoporosis, Rheumatoid arthritis (HCC), and Vertigo.   reports that she quit smoking about 14 years ago. Her smoking use included cigarettes. She started smoking about 34 years ago. She has a 20 pack-year smoking history. She has never used smokeless tobacco.  Past Surgical History:  Procedure Laterality Date   BRONCHIAL WASHINGS  09/12/2019   Procedure: BRONCHIAL WASHINGS;  Surgeon: Kalman Shan, MD;  Location: [facility];  Service: Endoscopy;;   CORONARY PRESSURE/FFR STUDY N/A 01/08/2020   Procedure: INTRAVASCULAR PRESSURE WIRE/FFR STUDY;  Surgeon: Swaziland, Peter M, MD;  Location: [facility] INVASIVE CV LAB;  Service: Cardiovascular;  Laterality: N/A;   INNER EAR SURGERY Right 02/27/2023   LEFT HEART CATH AND CORONARY ANGIOGRAPHY N/A 01/08/2020   Procedure: LEFT HEART CATH AND CORONARY ANGIOGRAPHY;  Surgeon: Swaziland, Peter M, MD;  Location: [facility] INVASIVE CV LAB;  Service: Cardiovascular;  Laterality: N/A;   TYMPANOPLASTY Right 02/27/2023   Procedure: TYMPANOPLASTY;  Surgeon: Serena Colonel, MD;  Location: [facility];  Service: ENT;  Laterality: Right;   VIDEO BRONCHOSCOPY N/A 09/12/2019   Procedure: VIDEO BRONCHOSCOPY WITHOUT FLUORO;  Surgeon: Kalman Shan, MD;  Location: [facility];  Service: Endoscopy;  Laterality: N/A;    Allergies  Allergen Reactions   Cat Dander     Other reaction(s): congestion    Dust Mite Extract     Other reaction(s): Unknown   Morphine And Codeine Nausea And Vomiting   Macrobid [Nitrofurantoin Monohyd Macro] Other (See Comments)    Lowered potassium, caused aches and pains    Immunization History  Administered Date(s) Administered   Fluad Quad(high Dose 65+) 01/20/2020, 12/31/2021   Fluad  Trivalent(High Dose 65+) 01/09/2023   Influenza Whole 01/04/2017   Influenza, High Dose Seasonal PF 01/03/2017, 12/27/2018, 03/03/2021   Influenza, Quadrivalent, Recombinant, Inj, Pf 01/09/2018, 01/11/2019   Influenza,inj,quad, With Preservative 12/26/2016   Influenza-Unspecified 12/26/2013, 12/27/2014, 12/27/2015   PFIZER(Purple Top)SARS-COV-2 Vaccination 04/17/2019, 05/06/2019, 01/31/2020   Pfizer Covid-19 Vaccine Bivalent Booster 61yrs & up 01/22/2021   Pneumococcal Conjugate-13 04/28/2014, 05/26/2014   Pneumococcal Polysaccharide-23 11/22/2017, 03/03/2021   Respiratory Syncytial Virus Vaccine,Recomb Aduvanted(Arexvy) 03/10/2022   Zoster Recombinant(Shingrix) 01/14/2019, 04/02/2019   Zoster, Live 05/22/2013, 04/01/2019    Family History  Problem Relation Age of Onset   Breast cancer Mother    Heart disease Father    Cancer Daughter 15       stage 3 breast      Current Outpatient Medications:    abatacept (ORENCIA) 250 MG injection, Inject 250 mg into the vein See admin instructions. Inject 250 mg IV once every month., Disp: , Rfl:    albuterol (VENTOLIN HFA) 108 (90 Base) MCG/ACT inhaler, Inhale 1-2 puffs into the lungs every 6 (six) hours as needed for wheezing or shortness of breath., Disp: 8 g, Rfl: 5   aspirin EC 81 MG tablet, Take 1 tablet (81 mg total) by mouth daily. Swallow whole., Disp: 90 tablet, Rfl: 3   Budeson-Glycopyrrol-Formoterol (BREZTRI AEROSPHERE) 160-9-4.8 MCG/ACT AERO, Inhale 2 puffs into the lungs in the morning and at bedtime., Disp: , Rfl:    Budeson-Glycopyrrol-Formoterol (BREZTRI AEROSPHERE) 160-9-4.8 MCG/ACT AERO, Inhale 2 puffs into  the lungs in the morning and at bedtime., Disp: , Rfl:    budeson-glycopyrrolate-formoterol (BREZTRI AEROSPHERE) 160-9-4.8 MCG/ACT AERO, Inhale 2 puffs into the lungs in the morning and at bedtime., Disp: , Rfl:    cetirizine (ZYRTEC) 10 MG tablet, Take 10 mg by mouth at bedtime as needed for allergies. , Disp: , Rfl:    diclofenac Sodium (VOLTAREN) 1 % GEL, Apply 4 g topically 4 (four) times daily., Disp: 100 g, Rfl: 0   KLOR-CON 10 10 MEQ tablet, Take 10 mEq by mouth daily., Disp: , Rfl: 0   levothyroxine (SYNTHROID) 75 MCG tablet, Take 75 mcg by mouth daily before breakfast., Disp: , Rfl:    montelukast (SINGULAIR) 10 MG tablet, Take 10 mg by mouth daily., Disp: , Rfl:    Multiple Vitamin (MULTIVITAMIN) tablet, Take 1 tablet by mouth daily., Disp: , Rfl:    Multiple Vitamins-Minerals (EQ MULTIVITAMINS ADULT GUMMY PO), Take 1 each by mouth daily., Disp: , Rfl:    nitroGLYCERIN (NITROSTAT) 0.4 MG SL tablet, Place 1 tablet (0.4 mg total) under the tongue every 5 (five) minutes as needed for chest pain., Disp: 25 tablet, Rfl: 3   oxyCODONE-acetaminophen (PERCOCET/ROXICET) 5-325 MG tablet, Take 1 tablet by mouth every 6 (six) hours as needed for severe pain (pain score 7-10)., Disp: 15 tablet, Rfl: 0   Probiotic Product (PROBIOTIC GUMMIES PO), Take by mouth daily as needed., Disp: , Rfl:    rosuvastatin (CRESTOR) 10 MG tablet, TAKE 1 TABLET BY MOUTH EVERY DAY, Disp: 90 tablet, Rfl: 0   senna-docusate (SENOKOT-S) 8.6-50 MG tablet, Take 1 tablet by mouth at bedtime as needed for mild constipation., Disp: 20 tablet, Rfl: 0   torsemide (DEMADEX) 20 MG tablet, Take 1 tablet (20 mg total) by mouth daily., Disp: 90 tablet, Rfl: 3   Turmeric (QC TUMERIC COMPLEX PO), Take by mouth daily., Disp: , Rfl:    verapamil (CALAN-SR) 240 MG CR tablet, TAKE 1 TABLET BY MOUTH EVERY DAY, Disp: 90 tablet, Rfl:  2   Vitamin D, Ergocalciferol, (DRISDOL) 1.25 MG (50000 UNIT) CAPS capsule, Take 50,000 Units by mouth every 7  (seven) days. Monday, Disp: , Rfl:       Objective:   Vitals:   06/29/23 1110  BP: 128/74  Pulse: 74  SpO2: 98%  Weight: 151 lb (68.5 kg)  Height: 5\' 3"  (1.6 m)    Estimated body mass index is 26.75 kg/m as calculated from the following:   Height as of this encounter: 5\' 3"  (1.6 m).   Weight as of this encounter: 151 lb (68.5 kg).  @WEIGHTCHANGE @  Filed Weights   06/29/23 1110  Weight: 151 lb (68.5 kg)     Physical Exam   General: No distress. In pain when O2 at rest: no Cane present: no Sitting in wheel chair: no Frail: no Obese: no Neuro: Alert and Oriented x 3. GCS 15. Speech normal Psych: Pleasant Resp:  Barrel Chest - no.  Wheeze - no, Crackles - no, No overt respiratory distress CVS: Normal heart sounds. Murmurs - no Ext: Stigmata of Connective Tissue Disease - no HEENT: Normal upper airway. PEERL +. No post nasal drip        Assessment:       ICD-10-CM   1. Moderate persistent asthma without complication  J45.40 CT Chest Wo Contrast    2. Pulmonary infiltrate present on computed tomography  R91.8 CT Chest Wo Contrast    3. Rheumatoid arthritis involving multiple sites, unspecified whether rheumatoid factor present (HCC)  M06.9 CT Chest Wo Contrast    4. Closed fracture of one rib of right side, initial encounter  S22.31XA CT Chest Wo Contrast         Plan:     Patient Instructions     ICD-10-CM   1. Moderate persistent asthma without complication  J45.40     2. Pulmonary infiltrate present on computed tomography  R91.8     3. Rheumatoid arthritis involving multiple sites, unspecified whether rheumatoid factor present (HCC)  M06.9     4. Closed fracture of one rib of right side, initial encounter  S22.31XA       CT scan September 2024 mild upper lobe pulmonary tree-in-bud infiltrates noted per radiology [not fully appreciable for me] Currently asthma stable but noted high because of Breztri inhaler which works well for you.   Plan  -shared decision making  - Continue t Breztri 2 puff 2 times daily [this is step up inhaler therapy]  -Call insurance find out cheaper alternatives  -- Take sample for the moment -Have CT scan of the chest in 6 months without contrast  - re rib fracture - talk to PCP Adrian Prince, MD regarding secondary causes   But keep doing incentive spiometryno   follow-up - Return in 6 months or sooner if needed     FOLLOWUP Return in about 6 months (around 12/29/2023) for with Dr Marchelle Gearing, Face to Face Visit, 15 min visit.    SIGNATURE    Dr. Kalman Shan, M.D., F.C.C.P,  Pulmonary and Critical Care Medicine Staff Physician, [facility] Center Director - Interstitial Lung Disease  Program  Pulmonary Fibrosis [facility] at [facility] [place], [place], [address]  Pager: [phone], If no answer or between  15:00h - 7:00h: call [phone] Telephone: [phone]  12:01 PM 06/29/2023

## 2023-06-29 NOTE — Patient Instructions (Addendum)
 ICD-10-CM   1. Moderate persistent asthma without complication  J45.40     2. Pulmonary infiltrate present on computed tomography  R91.8     3. Rheumatoid arthritis involving multiple sites, unspecified whether rheumatoid factor present (HCC)  M06.9     4. Closed fracture of one rib of right side, initial encounter  S22.31XA       CT scan September 2024 mild upper lobe pulmonary tree-in-bud infiltrates noted per radiology [not fully appreciable for me] Currently asthma stable but noted high because of Breztri inhaler which works well for you.   Plan -shared decision making  - Continue t Breztri 2 puff 2 times daily [this is step up inhaler therapy]  -Call insurance find out cheaper alternatives  -- Take sample for the moment -Have CT scan of the chest in 6 months without contrast  - re rib fracture - talk to PCP Adrian Prince, MD regarding secondary causes   But keep doing incentive spiometryno   follow-up - Return in 6 months or sooner if needed

## 2023-07-04 DIAGNOSIS — H90A31 Mixed conductive and sensorineural hearing loss, unilateral, right ear with restricted hearing on the contralateral side: Secondary | ICD-10-CM | POA: Diagnosis not present

## 2023-07-10 DIAGNOSIS — M0579 Rheumatoid arthritis with rheumatoid factor of multiple sites without organ or systems involvement: Secondary | ICD-10-CM | POA: Diagnosis not present

## 2023-07-24 NOTE — Progress Notes (Unsigned)
 Cardiology Office Note    Patient Name: Brandy Townsend Date of Encounter: 07/24/2023  Primary Care Provider:  Rosslyn Coons, MD Primary Cardiologist:  Janelle Mediate, MD Primary Electrophysiologist: None   Past Medical History    Past Medical History:  Diagnosis Date   Asthma    CAD (coronary artery disease)    Diverticulosis    Graves disease    s/p RAI   High cholesterol    Hypertension    Hypothyroidism    Nephrolithiasis    Osteoporosis    Rheumatoid arthritis (HCC)    Vertigo     History of Present Illness  Brandy Townsend is a 73 y.o. female with a PMH of CAD recurrent atypical chest pain, palpitations, aortic atherosclerosis, HTN, hypothyroidism s/p thyroid  ablation, asthma, rheumatoid arthritis, former tobacco abuse who presents today for annual follow-up.  Brandy Townsend was seen initially by Dr. Stann Earnest in 2017 plan of elevated heart rate and dyspnea.  She denied any associated chest pain but noted headache and reproducible palpitations in anterior neck.  She underwent a previous Myoview  in 05/17/2015 for complaint of chest pain that showed diaphragmatic attenuation but no ischemia.  She underwent a cardiac CTA on 01/06/2020 that shows significant stenosis in the RCA and OM1 with positive FFR.  She was advised to undergo LHC and was seen in the ED on 01/08/2020 with complaint of intermittent chest pain.  LHC was completed showing 70% stenosed ramus lesion with 20% mid LAD, 40% first diagonal lesion and 55% stenosed proximal RCA to mid RCA.  She was started on Imdur  and carvedilol  with verapamil  discontinued.  She was seen in follow-up and did not tolerate Imdur  due to headache.  She endorsed shortness of breath with Coreg  and was changed back to verapamil .  She was seen in the ED on 07/24/2021 with complaint of abdominal pain and chest pain with palpitations and shortness of breath.  EKG was completed showing no ischemia and troponins were negative.  She was started on Toprol  XL for  palpitations and completed an event monitor that showed predominantly sinus rhythm with runs of SVT.  She reported elevated heart rate with metoprolol  and this was discontinued and patient was referred to EP and evaluated by Dr. Carolynne Citron with advisement for watchful waiting.  TTE was completed showing EF of 55 to 60% with no valvular abnormalities.  She was last seen by Dr. Stann Earnest on 06/30/2022 for follow-up and patient's BP was running lower with verapamil  decreased to 120 mg.  She denied any chest pain and shortness of breath was being managed by pulmonology.  Brandy Townsend presents today for annual follow-up. She reports experiencing fatigue and occasional gasping, uncertain if related to asthma or cardiac issues. Gasping symptoms began around late February to mid-March, absent during the winter. She is sensitive to medications, as evidenced by a reaction to a sleep aid causing prolonged sleep, and does not tolerate drugs like Benadryl  well. Palpitations occur more frequently when relaxed, such as in the morning during quiet time or in the evening while watching TV, about once a month. She uses an incentive spirometer twice daily, and her pulmonologist is pleased with her progress. Her blood pressure is typically low, around 106/55-60, and she is currently on verapamil  240 mg.  She was advised by Dr. Nishan to decrease to 120 but reports they received a new prescription.  She attributes her fatigue and occasional shortness of breath to asthma or rheumatoid arthritis. 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 She usually sleeps seven hours, though not consecutively. Patient denies chest pain, palpitations, dyspnea, PND, orthopnea, nausea, vomiting, dizziness, syncope, edema, weight gain, or early satiety.  Discussed the use of AI scribe  software for clinical note transcription with the patient, who gave verbal consent to proceed.  History of Present Illness   Review of Systems  Please see the history of present illness.    All other systems reviewed and are otherwise negative except as noted above.  Physical Exam    Wt Readings from Last 3 Encounters:  06/29/23 151 lb (68.5 kg)  06/26/23 149 lb (67.6 kg)  04/12/23 157 lb 3.2 oz (71.3 kg)   NW:GNFAO were no vitals filed for this visit.,There is no height or weight on file to calculate BMI. GEN: Well nourished, well developed in no acute distress Neck: No JVD; No carotid bruits Pulmonary: Clear to auscultation without rales, wheezing or rhonchi  Cardiovascular: Normal rate. Regular rhythm. Normal S1. Normal S2.   Murmurs: There is no murmur.  ABDOMEN: Soft, non-tender, non-distended EXTREMITIES:  No edema; No deformity   EKG/LABS/ Recent Cardiac Studies   ECG personally reviewed by me today -none completed today  Risk Assessment/Calculations:          Lab Results  Component Value Date   WBC 11.1 (H) 02/28/2023   HGB 12.2 02/28/2023   HCT 37.8 02/28/2023   MCV 90.9 02/28/2023   PLT 256 02/28/2023   Lab Results  Component Value Date   CREATININE 0.79 02/28/2023   BUN 15 02/28/2023   NA 141 02/28/2023   K 3.9 02/28/2023   CL 106 02/28/2023   CO2 30 02/28/2023   Lab Results  Component Value Date   CHOL 145 03/16/2020   HDL 84 03/16/2020   LDLCALC 48 03/16/2020   TRIG 66 03/16/2020   CHOLHDL 1.7 03/16/2020    No results found for: "HGBA1C" Assessment & Plan    1.  CAD: -s/p LHC 12/2019 following abnormal CT with 0% stenosed ramus lesion with 20% mid LAD, 40% first diagonal lesion and 55% stenosed proximal RCA to mid RCA with medical therapy recommended - Today patient reports no chest pain or angina since previous follow-up. - Continue current GDMT with ASA 81 mg, Nitrostat  0.4 mg, Crestor  10 mg  2.  Essential hypertension: - Patient's last  blood pressure was stable at 116/56 - Reports ongoing fatigue and shortness of breath no diastolic BP. - Prescribe trial of verapamil  120 mg extended release tablets. - Monitor blood pressure and symptoms closely during trial period. - Instruct to take blood pressure 30 minutes to an hour after medication administration  3.  Hyperlipidemia: - Patient's last LDL cholesterol was 53 - Continue Crestor  10 mg daily  4. Hx of Asthma: - Currently followed by pulmonology with reports of gasping with season changing. - Continue current treatment plan per pulmonology - Consider use of a cool mist humidifier to manage environmental triggers.  5.  Palpitations: -s/p event monitor showing SVT and atrial tach with EP evaluated and no indication for ablation or antiarrhythmic medication - Patient currently on verapamil  240 mg daily  6. Fatigue: Chronic fatigue potentially due to stress, sleep disruption, and low diastolic pressure. No clear cardiac or respiratory cause. Consider verapamil  dose reduction. - Trial reduction of verapamil  to assess impact on fatigue. - Encourage stress  management techniques and adequate hydration.  Disposition: Follow-up with Janelle Mediate, MD or APP in 12 months   Signed, Francene Ing, Retha Cast, NP 07/24/2023, 11:20 AM [facility]

## 2023-07-25 ENCOUNTER — Ambulatory Visit: Attending: Nurse Practitioner | Admitting: Nurse Practitioner

## 2023-07-25 ENCOUNTER — Encounter: Payer: Self-pay | Admitting: Nurse Practitioner

## 2023-07-25 VITALS — BP 116/56 | HR 76 | Ht 63.0 in | Wt 151.8 lb

## 2023-07-25 DIAGNOSIS — I10 Essential (primary) hypertension: Secondary | ICD-10-CM | POA: Insufficient documentation

## 2023-07-25 DIAGNOSIS — E785 Hyperlipidemia, unspecified: Secondary | ICD-10-CM | POA: Diagnosis not present

## 2023-07-25 DIAGNOSIS — R002 Palpitations: Secondary | ICD-10-CM | POA: Insufficient documentation

## 2023-07-25 DIAGNOSIS — J4540 Moderate persistent asthma, uncomplicated: Secondary | ICD-10-CM | POA: Diagnosis not present

## 2023-07-25 DIAGNOSIS — I25119 Atherosclerotic heart disease of native coronary artery with unspecified angina pectoris: Secondary | ICD-10-CM | POA: Insufficient documentation

## 2023-07-25 MED ORDER — NITROGLYCERIN 0.4 MG SL SUBL
0.4000 mg | SUBLINGUAL_TABLET | SUBLINGUAL | 3 refills | Status: AC | PRN
  Filled 2023-09-27: qty 25, 1d supply, fill #0

## 2023-07-25 MED ORDER — VERAPAMIL HCL ER 120 MG PO TBCR: 120.0000 mg | EXTENDED_RELEASE_TABLET | Freq: Every day | ORAL | 0 refills | Status: DC | PRN

## 2023-07-25 NOTE — Patient Instructions (Signed)
 Medication Instructions:  START Verapamil  120mg  Take 1 tablet daily as needed *If you need a refill on your cardiac medications before your next appointment, please call your pharmacy*  Lab Work: None ordered If you have labs (blood work) drawn today and your tests are completely normal, you will receive your results only by: MyChart Message (if you have MyChart) OR A paper copy in the mail If you have any lab test that is abnormal or we need to change your treatment, we will call you to review the results.  Testing/Procedures: None ordered  Follow-Up: At [facility], you and your health needs are our priority.  As part of our continuing mission to provide you with exceptional heart care, our providers are all part of one team.  This team includes your primary Cardiologist (physician) and Advanced Practice Providers or APPs (Physician Assistants and Nurse Practitioners) who all work together to provide you with the care you need, when you need it.  Your next appointment:   12 month(s)  Provider:   Janelle Mediate, MD    We recommend signing up for the patient portal called "MyChart".  Sign up information is provided on this After Visit Summary.  MyChart is used to connect with patients for Virtual Visits (Telemedicine).  Patients are able to view lab/test results, encounter notes, upcoming appointments, etc.  Non-urgent messages can be sent to your provider as well.   To learn more about what you can do with MyChart, go to [email or web address].   Other Instructions

## 2023-07-28 DIAGNOSIS — M25551 Pain in right hip: Secondary | ICD-10-CM | POA: Diagnosis not present

## 2023-08-07 DIAGNOSIS — M0579 Rheumatoid arthritis with rheumatoid factor of multiple sites without organ or systems involvement: Secondary | ICD-10-CM | POA: Diagnosis not present

## 2023-08-25 ENCOUNTER — Other Ambulatory Visit: Payer: Self-pay | Admitting: Nurse Practitioner

## 2023-08-31 DIAGNOSIS — Z1231 Encounter for screening mammogram for malignant neoplasm of breast: Secondary | ICD-10-CM | POA: Diagnosis not present

## 2023-09-04 DIAGNOSIS — M0579 Rheumatoid arthritis with rheumatoid factor of multiple sites without organ or systems involvement: Secondary | ICD-10-CM | POA: Diagnosis not present

## 2023-09-08 DIAGNOSIS — E042 Nontoxic multinodular goiter: Secondary | ICD-10-CM | POA: Diagnosis not present

## 2023-09-08 DIAGNOSIS — Z638 Other specified problems related to primary support group: Secondary | ICD-10-CM | POA: Diagnosis not present

## 2023-09-08 DIAGNOSIS — D692 Other nonthrombocytopenic purpura: Secondary | ICD-10-CM | POA: Diagnosis not present

## 2023-09-08 DIAGNOSIS — E890 Postprocedural hypothyroidism: Secondary | ICD-10-CM | POA: Diagnosis not present

## 2023-09-08 DIAGNOSIS — R5382 Chronic fatigue, unspecified: Secondary | ICD-10-CM | POA: Diagnosis not present

## 2023-09-08 DIAGNOSIS — I10 Essential (primary) hypertension: Secondary | ICD-10-CM | POA: Diagnosis not present

## 2023-09-08 DIAGNOSIS — M060A Rheumatoid arthritis without rheumatoid factor, other specified site: Secondary | ICD-10-CM | POA: Diagnosis not present

## 2023-09-08 DIAGNOSIS — R002 Palpitations: Secondary | ICD-10-CM | POA: Diagnosis not present

## 2023-09-08 DIAGNOSIS — G4700 Insomnia, unspecified: Secondary | ICD-10-CM | POA: Diagnosis not present

## 2023-09-08 DIAGNOSIS — E039 Hypothyroidism, unspecified: Secondary | ICD-10-CM | POA: Diagnosis not present

## 2023-09-25 ENCOUNTER — Other Ambulatory Visit: Payer: Self-pay

## 2023-09-25 ENCOUNTER — Other Ambulatory Visit (HOSPITAL_BASED_OUTPATIENT_CLINIC_OR_DEPARTMENT_OTHER): Payer: Self-pay

## 2023-09-25 MED ORDER — MONTELUKAST SODIUM 10 MG PO TABS
10.0000 mg | ORAL_TABLET | Freq: Every day | ORAL | 3 refills | Status: AC
  Filled 2023-09-27: qty 90, 90d supply, fill #0

## 2023-09-25 MED ORDER — TORSEMIDE 20 MG PO TABS
ORAL_TABLET | ORAL | 4 refills | Status: AC
  Filled 2023-09-27: qty 135, 90d supply, fill #0

## 2023-09-25 MED ORDER — CIPROFLOXACIN-DEXAMETHASONE 0.3-0.1 % OT SUSP
4.0000 [drp] | Freq: Two times a day (BID) | OTIC | 1 refills | Status: DC
  Filled 2023-09-27: qty 7.5, 19d supply, fill #0

## 2023-09-25 MED ORDER — VERAPAMIL HCL ER 240 MG PO TBCR
240.0000 mg | EXTENDED_RELEASE_TABLET | Freq: Every day | ORAL | 2 refills | Status: DC
Start: 2023-09-25 — End: 2023-11-12

## 2023-09-25 MED ORDER — POTASSIUM CHLORIDE ER 10 MEQ PO TBCR
10.0000 meq | EXTENDED_RELEASE_TABLET | Freq: Every day | ORAL | 3 refills | Status: AC
  Filled 2023-09-27: qty 90, 90d supply, fill #0

## 2023-09-25 MED ORDER — LEVOTHYROXINE SODIUM 75 MCG PO TABS
75.0000 ug | ORAL_TABLET | Freq: Every morning | ORAL | 4 refills | Status: AC
  Filled 2023-09-27: qty 90, 90d supply, fill #0

## 2023-09-25 MED ORDER — ROSUVASTATIN CALCIUM 10 MG PO TABS
10.0000 mg | ORAL_TABLET | Freq: Every day | ORAL | 2 refills | Status: AC
  Filled 2023-09-25 – 2023-09-27 (×2): qty 90, 90d supply, fill #0

## 2023-09-26 ENCOUNTER — Other Ambulatory Visit (HOSPITAL_BASED_OUTPATIENT_CLINIC_OR_DEPARTMENT_OTHER): Payer: Self-pay

## 2023-09-27 ENCOUNTER — Other Ambulatory Visit (HOSPITAL_BASED_OUTPATIENT_CLINIC_OR_DEPARTMENT_OTHER): Payer: Self-pay

## 2023-09-27 MED FILL — Verapamil HCl Tab ER 120 MG: ORAL | 90 days supply | Qty: 90 | Fill #0 | Status: CN

## 2023-10-02 DIAGNOSIS — M0579 Rheumatoid arthritis with rheumatoid factor of multiple sites without organ or systems involvement: Secondary | ICD-10-CM | POA: Diagnosis not present

## 2023-10-05 ENCOUNTER — Emergency Department (HOSPITAL_BASED_OUTPATIENT_CLINIC_OR_DEPARTMENT_OTHER)
Admission: EM | Admit: 2023-10-05 | Discharge: 2023-10-05 | Disposition: A | Discharge status: Home / Self Care | Attending: Emergency Medicine | Admitting: Emergency Medicine

## 2023-10-05 ENCOUNTER — Emergency Department (HOSPITAL_BASED_OUTPATIENT_CLINIC_OR_DEPARTMENT_OTHER)

## 2023-10-05 ENCOUNTER — Other Ambulatory Visit: Payer: Self-pay

## 2023-10-05 ENCOUNTER — Encounter (HOSPITAL_BASED_OUTPATIENT_CLINIC_OR_DEPARTMENT_OTHER): Payer: Self-pay | Admitting: Emergency Medicine

## 2023-10-05 DIAGNOSIS — I10 Essential (primary) hypertension: Secondary | ICD-10-CM | POA: Insufficient documentation

## 2023-10-05 DIAGNOSIS — M542 Cervicalgia: Secondary | ICD-10-CM | POA: Insufficient documentation

## 2023-10-05 DIAGNOSIS — I2510 Atherosclerotic heart disease of native coronary artery without angina pectoris: Secondary | ICD-10-CM | POA: Insufficient documentation

## 2023-10-05 DIAGNOSIS — E039 Hypothyroidism, unspecified: Secondary | ICD-10-CM | POA: Diagnosis not present

## 2023-10-05 DIAGNOSIS — J45909 Unspecified asthma, uncomplicated: Secondary | ICD-10-CM | POA: Diagnosis not present

## 2023-10-05 DIAGNOSIS — M50322 Other cervical disc degeneration at C5-C6 level: Secondary | ICD-10-CM | POA: Diagnosis not present

## 2023-10-05 DIAGNOSIS — Z87891 Personal history of nicotine dependence: Secondary | ICD-10-CM | POA: Insufficient documentation

## 2023-10-05 DIAGNOSIS — W228XXA Striking against or struck by other objects, initial encounter: Secondary | ICD-10-CM | POA: Insufficient documentation

## 2023-10-05 DIAGNOSIS — S199XXA Unspecified injury of neck, initial encounter: Secondary | ICD-10-CM | POA: Diagnosis not present

## 2023-10-05 DIAGNOSIS — Z7982 Long term (current) use of aspirin: Secondary | ICD-10-CM | POA: Diagnosis not present

## 2023-10-05 DIAGNOSIS — S0990XA Unspecified injury of head, initial encounter: Secondary | ICD-10-CM | POA: Diagnosis present

## 2023-10-05 DIAGNOSIS — S060X0A Concussion without loss of consciousness, initial encounter: Secondary | ICD-10-CM | POA: Diagnosis not present

## 2023-10-05 DIAGNOSIS — M47812 Spondylosis without myelopathy or radiculopathy, cervical region: Secondary | ICD-10-CM | POA: Diagnosis not present

## 2023-10-05 MED ORDER — ONDANSETRON 4 MG PO TBDP
4.0000 mg | ORAL_TABLET | Freq: Once | ORAL | Status: AC
  Administered 2023-10-05: 4 mg via ORAL
  Filled 2023-10-05: qty 1

## 2023-10-05 NOTE — Discharge Instructions (Addendum)
 CT head and neck without any acute findings.  Symptoms seem to be consistent with concussion.  Recommend brain rest.  Minimize screen time.  Avoid any situations where if he got dizzy it could be dangerous.  If symptoms do not improve over the next few days follow-up with your doctor.
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
## 2023-10-10 DIAGNOSIS — J0100 Acute maxillary sinusitis, unspecified: Secondary | ICD-10-CM | POA: Diagnosis not present

## 2023-10-10 DIAGNOSIS — H6991 Unspecified Eustachian tube disorder, right ear: Secondary | ICD-10-CM | POA: Diagnosis not present

## 2023-10-10 DIAGNOSIS — H90A31 Mixed conductive and sensorineural hearing loss, unilateral, right ear with restricted hearing on the contralateral side: Secondary | ICD-10-CM | POA: Diagnosis not present

## 2023-10-10 DIAGNOSIS — H7291 Unspecified perforation of tympanic membrane, right ear: Secondary | ICD-10-CM | POA: Diagnosis not present
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
## 2023-10-11 DIAGNOSIS — G44309 Post-traumatic headache, unspecified, not intractable: Secondary | ICD-10-CM | POA: Diagnosis not present

## 2023-10-11 DIAGNOSIS — M542 Cervicalgia: Secondary | ICD-10-CM | POA: Diagnosis not present

## 2023-10-11 DIAGNOSIS — S0990XD Unspecified injury of head, subsequent encounter: Secondary | ICD-10-CM | POA: Diagnosis not present

## 2023-11-01 DIAGNOSIS — Z111 Encounter for screening for respiratory tuberculosis: Secondary | ICD-10-CM | POA: Diagnosis not present

## 2023-11-01 DIAGNOSIS — R5383 Other fatigue: Secondary | ICD-10-CM | POA: Diagnosis not present

## 2023-11-01 DIAGNOSIS — M0579 Rheumatoid arthritis with rheumatoid factor of multiple sites without organ or systems involvement: Secondary | ICD-10-CM | POA: Diagnosis not present

## 2023-11-01 DIAGNOSIS — Z79899 Other long term (current) drug therapy: Secondary | ICD-10-CM | POA: Diagnosis not present

## 2023-11-08 ENCOUNTER — Other Ambulatory Visit: Payer: Self-pay

## 2023-11-08 ENCOUNTER — Emergency Department (HOSPITAL_COMMUNITY)

## 2023-11-08 ENCOUNTER — Emergency Department (HOSPITAL_COMMUNITY)
Admission: EM | Admit: 2023-11-08 | Discharge: 2023-11-08 | Disposition: A | Discharge status: Home / Self Care | Attending: Emergency Medicine | Admitting: Emergency Medicine

## 2023-11-08 ENCOUNTER — Encounter (HOSPITAL_COMMUNITY): Payer: Self-pay

## 2023-11-08 DIAGNOSIS — R079 Chest pain, unspecified: Secondary | ICD-10-CM | POA: Diagnosis not present

## 2023-11-08 DIAGNOSIS — I2510 Atherosclerotic heart disease of native coronary artery without angina pectoris: Secondary | ICD-10-CM | POA: Insufficient documentation

## 2023-11-08 DIAGNOSIS — Z87891 Personal history of nicotine dependence: Secondary | ICD-10-CM | POA: Diagnosis not present

## 2023-11-08 DIAGNOSIS — R918 Other nonspecific abnormal finding of lung field: Secondary | ICD-10-CM | POA: Diagnosis not present

## 2023-11-08 DIAGNOSIS — G4489 Other headache syndrome: Secondary | ICD-10-CM | POA: Diagnosis not present

## 2023-11-08 DIAGNOSIS — Z7982 Long term (current) use of aspirin: Secondary | ICD-10-CM | POA: Diagnosis not present

## 2023-11-08 DIAGNOSIS — I10 Essential (primary) hypertension: Secondary | ICD-10-CM | POA: Insufficient documentation

## 2023-11-08 DIAGNOSIS — J45909 Unspecified asthma, uncomplicated: Secondary | ICD-10-CM | POA: Diagnosis not present

## 2023-11-08 DIAGNOSIS — R0789 Other chest pain: Secondary | ICD-10-CM | POA: Diagnosis not present

## 2023-11-08 DIAGNOSIS — Z79899 Other long term (current) drug therapy: Secondary | ICD-10-CM | POA: Insufficient documentation

## 2023-11-08 DIAGNOSIS — Z7951 Long term (current) use of inhaled steroids: Secondary | ICD-10-CM | POA: Diagnosis not present

## 2023-11-08 DIAGNOSIS — R110 Nausea: Secondary | ICD-10-CM | POA: Diagnosis not present

## 2023-11-08 DIAGNOSIS — R519 Headache, unspecified: Secondary | ICD-10-CM | POA: Insufficient documentation

## 2023-11-08 LAB — CBC WITH DIFFERENTIAL/PLATELET
Abs Immature Granulocytes: 0.03 K/uL (ref 0.00–0.07)
Basophils Absolute: 0.1 K/uL (ref 0.0–0.1)
Basophils Relative: 1 %
Eosinophils Absolute: 0.5 K/uL (ref 0.0–0.5)
Eosinophils Relative: 6 %
HCT: 38.4 % (ref 36.0–46.0)
Hemoglobin: 12.4 g/dL (ref 12.0–15.0)
Immature Granulocytes: 0 %
Lymphocytes Relative: 29 %
Lymphs Abs: 2.5 K/uL (ref 0.7–4.0)
MCH: 29.2 pg (ref 26.0–34.0)
MCHC: 32.3 g/dL (ref 30.0–36.0)
MCV: 90.4 fL (ref 80.0–100.0)
Monocytes Absolute: 0.9 K/uL (ref 0.1–1.0)
Monocytes Relative: 11 %
Neutro Abs: 4.6 K/uL (ref 1.7–7.7)
Neutrophils Relative %: 53 %
Platelets: 322 K/uL (ref 150–400)
RBC: 4.25 MIL/uL (ref 3.87–5.11)
RDW: 13.4 % (ref 11.5–15.5)
WBC: 8.6 K/uL (ref 4.0–10.5)
nRBC: 0 % (ref 0.0–0.2)

## 2023-11-08 LAB — BASIC METABOLIC PANEL WITH GFR
Anion gap: 9 (ref 5–15)
BUN: 18 mg/dL (ref 8–23)
CO2: 24 mmol/L (ref 22–32)
Calcium: 9.2 mg/dL (ref 8.9–10.3)
Chloride: 107 mmol/L (ref 98–111)
Creatinine, Ser: 0.77 mg/dL (ref 0.44–1.00)
GFR, Estimated: >60 mL/min (ref >60)
Glucose, Bld: 124 mg/dL — ABNORMAL HIGH (ref 70–99)
Potassium: 3.7 mmol/L (ref 3.5–5.1)
Sodium: 140 mmol/L (ref 135–145)

## 2023-11-08 LAB — TROPONIN I (HIGH SENSITIVITY)
Troponin I (High Sensitivity): 2 ng/L (ref <18)
Troponin I (High Sensitivity): 2 ng/L (ref <18)

## 2023-11-08 MED ORDER — METOCLOPRAMIDE HCL 5 MG/ML IJ SOLN
10.0000 mg | Freq: Once | INTRAMUSCULAR | Status: AC
  Administered 2023-11-08 (×2): 10 mg via INTRAVENOUS
  Filled 2023-11-08: qty 2

## 2023-11-08 NOTE — ED Triage Notes (Addendum)
 Pt arrived via [facility] from home c/o of chest pain/pressure. Pt states the pain radiates down back and neck. Pt also c/o headache that worsened with nitroglycerin . Initial BP with EMS 160/100, after nitroglycerin  BP 124/72. 2mg  morphine  and 4mg  zofran  given by EMS.
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
## 2023-11-08 NOTE — Discharge Instructions (Addendum)
 Your workup in the ER today was reassuring for acute findings.  I recommend you call your PCP to schedule a close follow-up appointment.  Please call your cardiologist as well.  Return if development of any new or worsening symptoms

## 2023-11-08 NOTE — ED Provider Notes (Signed)
 Care assumed from Ubaldo High, PA-C at shift change. Please see their note for further information Physical Exam  BP 104/61   Pulse (!) 56   Temp 97.9 F (36.6 C) (Oral)   Resp 17   Ht 5' 3 (1.6 m)   Wt 67.6 kg   SpO2 96%   BMI 26.39 kg/m   Physical Exam Vitals and nursing note reviewed.  Constitutional:      General: She is not in acute distress.    Appearance: Normal appearance. She is normal weight. She is not ill-appearing, toxic-appearing or diaphoretic.  HENT:     Head: Normocephalic and atraumatic.  Cardiovascular:     Rate and Rhythm: Normal rate.  Pulmonary:     Effort: Pulmonary effort is normal. No respiratory distress.  Musculoskeletal:        General: Normal range of motion.     Cervical back: Normal range of motion.  Skin:    General: Skin is warm and dry.  Neurological:     General: No focal deficit present.     Mental Status: She is alert.  Psychiatric:        Mood and Affect: Mood normal.        Behavior: Behavior normal.     Procedures  Procedures  ED Course / MDM    Medical Decision Making Amount and/or Complexity of Data Reviewed Labs: ordered. Radiology: ordered.  Risk Prescription drug management.   Briefly: Patient with chest pain, awoke her from sleep this morning, took aspirin  and nitroglycerin . 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 Work-up so far is unremarkable, plan for second troponin and then likely d/c if normal.   Patient's second troponin is 2.  Upon reassessment, patient is feeling significantly improved, she does not want to stay in the hospital and feels ready to go home.  Her chest x-ray did show potential early infiltrate, however discussed this with the patient and she is not having any symptoms to suggest pneumonia as the etiology of her symptoms.  She has a follow-up appointment with her pcp later this week and will follow-up with her cardiologist as well. Her work-up is benign, has  no signs or symptoms to suggest AAA or PE. She feels like this is related to her RA as she has had a few flares this week and reports she has had similar pain previously that has been attributed to her RA. Plan for d/c to home per previous providers recommendations.   Evaluation and diagnostic testing in the emergency department does not suggest an emergent condition requiring admission or immediate intervention beyond what has been performed at this time.  Plan for discharge with close PCP follow-up.  Patient is understanding and amenable with plan, educated on red flag symptoms that would prompt immediate return.  Patient discharged in stable condition.     Brandy Townsend 11/08/23 [id number]    Garrick Charleston, MD 11/08/23 1215

## 2023-11-10 DIAGNOSIS — M810 Age-related osteoporosis without current pathological fracture: Secondary | ICD-10-CM | POA: Diagnosis not present

## 2023-11-10 DIAGNOSIS — E042 Nontoxic multinodular goiter: Secondary | ICD-10-CM | POA: Diagnosis not present

## 2023-11-10 DIAGNOSIS — R9389 Abnormal findings on diagnostic imaging of other specified body structures: Secondary | ICD-10-CM | POA: Diagnosis not present

## 2023-11-10 DIAGNOSIS — R002 Palpitations: Secondary | ICD-10-CM | POA: Diagnosis not present

## 2023-11-10 DIAGNOSIS — I2510 Atherosclerotic heart disease of native coronary artery without angina pectoris: Secondary | ICD-10-CM | POA: Diagnosis not present

## 2023-11-10 DIAGNOSIS — R5382 Chronic fatigue, unspecified: Secondary | ICD-10-CM | POA: Diagnosis not present

## 2023-11-10 DIAGNOSIS — M060A Rheumatoid arthritis without rheumatoid factor, other specified site: Secondary | ICD-10-CM | POA: Diagnosis not present

## 2023-11-10 DIAGNOSIS — I700 Atherosclerosis of aorta: Secondary | ICD-10-CM | POA: Diagnosis not present

## 2023-11-10 DIAGNOSIS — E890 Postprocedural hypothyroidism: Secondary | ICD-10-CM | POA: Diagnosis not present

## 2023-11-10 DIAGNOSIS — I10 Essential (primary) hypertension: Secondary | ICD-10-CM | POA: Diagnosis not present

## 2023-11-10 DIAGNOSIS — E785 Hyperlipidemia, unspecified: Secondary | ICD-10-CM | POA: Diagnosis not present

## 2023-11-10 DIAGNOSIS — J4540 Moderate persistent asthma, uncomplicated: Secondary | ICD-10-CM | POA: Diagnosis not present

## 2023-11-12 ENCOUNTER — Emergency Department (HOSPITAL_COMMUNITY)

## 2023-11-12 ENCOUNTER — Other Ambulatory Visit: Payer: Self-pay

## 2023-11-12 ENCOUNTER — Observation Stay (HOSPITAL_COMMUNITY)
Admission: EM | Admit: 2023-11-12 | Discharge: 2023-11-13 | Disposition: A | Discharge status: Home / Self Care | Attending: Family Medicine | Admitting: Family Medicine

## 2023-11-12 ENCOUNTER — Encounter (HOSPITAL_COMMUNITY): Payer: Self-pay

## 2023-11-12 DIAGNOSIS — J45909 Unspecified asthma, uncomplicated: Secondary | ICD-10-CM | POA: Diagnosis not present

## 2023-11-12 DIAGNOSIS — M069 Rheumatoid arthritis, unspecified: Secondary | ICD-10-CM | POA: Diagnosis present

## 2023-11-12 DIAGNOSIS — R0789 Other chest pain: Secondary | ICD-10-CM | POA: Diagnosis not present

## 2023-11-12 DIAGNOSIS — F419 Anxiety disorder, unspecified: Secondary | ICD-10-CM | POA: Diagnosis not present

## 2023-11-12 DIAGNOSIS — I25110 Atherosclerotic heart disease of native coronary artery with unstable angina pectoris: Secondary | ICD-10-CM | POA: Diagnosis not present

## 2023-11-12 DIAGNOSIS — J449 Chronic obstructive pulmonary disease, unspecified: Secondary | ICD-10-CM | POA: Diagnosis not present

## 2023-11-12 DIAGNOSIS — J4520 Mild intermittent asthma, uncomplicated: Secondary | ICD-10-CM

## 2023-11-12 DIAGNOSIS — Z87891 Personal history of nicotine dependence: Secondary | ICD-10-CM | POA: Insufficient documentation

## 2023-11-12 DIAGNOSIS — M359 Systemic involvement of connective tissue, unspecified: Secondary | ICD-10-CM | POA: Insufficient documentation

## 2023-11-12 DIAGNOSIS — Z8679 Personal history of other diseases of the circulatory system: Secondary | ICD-10-CM | POA: Insufficient documentation

## 2023-11-12 DIAGNOSIS — I200 Unstable angina: Secondary | ICD-10-CM | POA: Diagnosis not present

## 2023-11-12 DIAGNOSIS — G4489 Other headache syndrome: Secondary | ICD-10-CM | POA: Diagnosis not present

## 2023-11-12 DIAGNOSIS — Z7982 Long term (current) use of aspirin: Secondary | ICD-10-CM | POA: Insufficient documentation

## 2023-11-12 DIAGNOSIS — R110 Nausea: Secondary | ICD-10-CM | POA: Diagnosis not present

## 2023-11-12 DIAGNOSIS — I10 Essential (primary) hypertension: Secondary | ICD-10-CM | POA: Diagnosis not present

## 2023-11-12 DIAGNOSIS — E039 Hypothyroidism, unspecified: Secondary | ICD-10-CM | POA: Insufficient documentation

## 2023-11-12 DIAGNOSIS — R079 Chest pain, unspecified: Principal | ICD-10-CM

## 2023-11-12 DIAGNOSIS — R42 Dizziness and giddiness: Secondary | ICD-10-CM | POA: Diagnosis not present

## 2023-11-12 DIAGNOSIS — J949 Pleural condition, unspecified: Secondary | ICD-10-CM | POA: Diagnosis not present

## 2023-11-12 LAB — D-DIMER, QUANTITATIVE: D-Dimer, Quant: 0.6 ug{FEU}/mL — ABNORMAL HIGH (ref 0.00–0.50)

## 2023-11-12 LAB — BASIC METABOLIC PANEL WITH GFR
Anion gap: 9 (ref 5–15)
BUN: 24 mg/dL — ABNORMAL HIGH (ref 8–23)
CO2: 21 mmol/L — ABNORMAL LOW (ref 22–32)
Calcium: 8.8 mg/dL — ABNORMAL LOW (ref 8.9–10.3)
Chloride: 112 mmol/L — ABNORMAL HIGH (ref 98–111)
Creatinine, Ser: 0.91 mg/dL (ref 0.44–1.00)
GFR, Estimated: >60 mL/min (ref >60)
Glucose, Bld: 114 mg/dL — ABNORMAL HIGH (ref 70–99)
Potassium: 3.8 mmol/L (ref 3.5–5.1)
Sodium: 142 mmol/L (ref 135–145)

## 2023-11-12 LAB — TROPONIN I (HIGH SENSITIVITY)
Troponin I (High Sensitivity): 3 ng/L (ref <18)
Troponin I (High Sensitivity): 3 ng/L (ref <18)

## 2023-11-12 LAB — CBC
HCT: 35.7 % — ABNORMAL LOW (ref 36.0–46.0)
Hemoglobin: 11.5 g/dL — ABNORMAL LOW (ref 12.0–15.0)
MCH: 29.4 pg (ref 26.0–34.0)
MCHC: 32.2 g/dL (ref 30.0–36.0)
MCV: 91.3 fL (ref 80.0–100.0)
Platelets: 269 K/uL (ref 150–400)
RBC: 3.91 MIL/uL (ref 3.87–5.11)
RDW: 13.5 % (ref 11.5–15.5)
WBC: 8.8 K/uL (ref 4.0–10.5)
nRBC: 0 % (ref 0.0–0.2)

## 2023-11-12 MED ORDER — HYDROCODONE-ACETAMINOPHEN 5-325 MG PO TABS
1.0000 | ORAL_TABLET | Freq: Four times a day (QID) | ORAL | Status: DC | PRN
  Administered 2023-11-12 (×2): 1 via ORAL
  Filled 2023-11-12 (×2): qty 1

## 2023-11-12 MED ORDER — SODIUM CHLORIDE 0.9 % IV SOLN: 250.0000 mL | INTRAVENOUS | Status: AC | PRN

## 2023-11-12 MED ORDER — LEVOTHYROXINE SODIUM 75 MCG PO TABS
75.0000 ug | ORAL_TABLET | Freq: Every morning | ORAL | Status: DC
  Administered 2023-11-12 – 2023-11-13 (×2): 75 ug via ORAL
  Filled 2023-11-12 (×2): qty 1

## 2023-11-12 MED ORDER — ASPIRIN 81 MG PO TBEC
81.0000 mg | DELAYED_RELEASE_TABLET | Freq: Every day | ORAL | Status: DC
  Administered 2023-11-12 – 2023-11-13 (×2): 81 mg via ORAL
  Filled 2023-11-12 (×2): qty 1

## 2023-11-12 MED ORDER — ONDANSETRON HCL 4 MG/2ML IJ SOLN
4.0000 mg | Freq: Four times a day (QID) | INTRAMUSCULAR | Status: DC | PRN
  Administered 2023-11-12: 4 mg via INTRAVENOUS
  Filled 2023-11-12: qty 2

## 2023-11-12 MED ORDER — ASPIRIN 81 MG PO CHEW: 324.0000 mg | CHEWABLE_TABLET | Freq: Once | ORAL | Status: DC

## 2023-11-12 MED ORDER — LEVALBUTEROL HCL 0.63 MG/3ML IN NEBU: 0.6300 mg | INHALATION_SOLUTION | Freq: Four times a day (QID) | RESPIRATORY_TRACT | Status: DC | PRN

## 2023-11-12 MED ORDER — BUDESON-GLYCOPYRROL-FORMOTEROL 160-9-4.8 MCG/ACT IN AERO
2.0000 | INHALATION_SPRAY | Freq: Two times a day (BID) | RESPIRATORY_TRACT | Status: DC
  Administered 2023-11-12: 2 via RESPIRATORY_TRACT
  Filled 2023-11-12 (×2): qty 5.9

## 2023-11-12 MED ORDER — ROSUVASTATIN CALCIUM 5 MG PO TABS
10.0000 mg | ORAL_TABLET | Freq: Every day | ORAL | Status: DC
  Administered 2023-11-12 – 2023-11-13 (×2): 10 mg via ORAL
  Filled 2023-11-12 (×2): qty 2

## 2023-11-12 MED ORDER — SODIUM CHLORIDE 0.9% FLUSH
3.0000 mL | Freq: Two times a day (BID) | INTRAVENOUS | Status: DC
  Administered 2023-11-13: 3 mL via INTRAVENOUS

## 2023-11-12 MED ORDER — ASPIRIN 81 MG PO TBEC: 81.0000 mg | DELAYED_RELEASE_TABLET | Freq: Every day | ORAL | Status: DC

## 2023-11-12 MED ORDER — MONTELUKAST SODIUM 10 MG PO TABS
10.0000 mg | ORAL_TABLET | Freq: Every day | ORAL | Status: DC
Start: 2023-11-12 — End: 2023-11-13
  Administered 2023-11-12 – 2023-11-13 (×2): 10 mg via ORAL
  Filled 2023-11-12 (×2): qty 1

## 2023-11-12 MED ORDER — ENOXAPARIN SODIUM 40 MG/0.4ML IJ SOSY
40.0000 mg | PREFILLED_SYRINGE | INTRAMUSCULAR | Status: DC
  Administered 2023-11-12 – 2023-11-13 (×2): 40 mg via SUBCUTANEOUS
  Filled 2023-11-12 (×2): qty 0.4

## 2023-11-12 MED ORDER — ALBUTEROL SULFATE (2.5 MG/3ML) 0.083% IN NEBU: 2.5000 mg | INHALATION_SOLUTION | Freq: Four times a day (QID) | RESPIRATORY_TRACT | Status: DC | PRN

## 2023-11-12 MED ORDER — FREE WATER
500.0000 mL | Freq: Once | Status: AC
  Administered 2023-11-13: 500 mL via ORAL

## 2023-11-12 MED ORDER — ONDANSETRON HCL 4 MG PO TABS: 4.0000 mg | ORAL_TABLET | Freq: Four times a day (QID) | ORAL | Status: DC | PRN

## 2023-11-12 MED ORDER — FENTANYL CITRATE PF 50 MCG/ML IJ SOSY
50.0000 ug | PREFILLED_SYRINGE | Freq: Once | INTRAMUSCULAR | Status: AC
  Administered 2023-11-12: 50 ug via INTRAVENOUS
  Filled 2023-11-12: qty 1

## 2023-11-12 MED ORDER — VERAPAMIL HCL ER 120 MG PO TBCR: 120.0000 mg | EXTENDED_RELEASE_TABLET | Freq: Every day | ORAL | Status: DC

## 2023-11-12 MED ORDER — ACETAMINOPHEN 650 MG RE SUPP: 650.0000 mg | Freq: Four times a day (QID) | RECTAL | Status: DC | PRN

## 2023-11-12 MED ORDER — PROCHLORPERAZINE EDISYLATE 10 MG/2ML IJ SOLN
5.0000 mg | Freq: Four times a day (QID) | INTRAMUSCULAR | Status: DC | PRN
  Administered 2023-11-12: 5 mg via INTRAVENOUS
  Filled 2023-11-12: qty 2

## 2023-11-12 MED ORDER — VERAPAMIL HCL ER 120 MG PO TBCR
120.0000 mg | EXTENDED_RELEASE_TABLET | Freq: Every day | ORAL | Status: DC
  Administered 2023-11-12: 120 mg via ORAL
  Filled 2023-11-12 (×2): qty 1

## 2023-11-12 MED ORDER — SODIUM CHLORIDE 0.9% FLUSH
3.0000 mL | Freq: Two times a day (BID) | INTRAVENOUS | Status: DC
  Administered 2023-11-12 – 2023-11-13 (×4): 3 mL via INTRAVENOUS

## 2023-11-12 MED ORDER — ACETAMINOPHEN 325 MG PO TABS
650.0000 mg | ORAL_TABLET | Freq: Four times a day (QID) | ORAL | Status: DC | PRN
  Administered 2023-11-12: 650 mg via ORAL
  Filled 2023-11-12: qty 2

## 2023-11-12 MED ORDER — SENNA 8.6 MG PO TABS
1.0000 | ORAL_TABLET | Freq: Every day | ORAL | Status: DC
  Administered 2023-11-12 – 2023-11-13 (×2): 8.6 mg via ORAL
  Filled 2023-11-12 (×2): qty 1

## 2023-11-12 MED ORDER — VERAPAMIL HCL ER 240 MG PO TBCR: 240.0000 mg | EXTENDED_RELEASE_TABLET | Freq: Every day | ORAL | Status: DC

## 2023-11-12 MED ORDER — NITROGLYCERIN 0.4 MG SL SUBL: 0.4000 mg | SUBLINGUAL_TABLET | SUBLINGUAL | Status: DC | PRN

## 2023-11-12 MED ORDER — SODIUM CHLORIDE 0.9% FLUSH: 3.0000 mL | INTRAVENOUS | Status: DC | PRN

## 2023-11-12 NOTE — Consult Note (Signed)
 Cardiology Consultation   Patient ID: [name] MRN: [id number]; DOB: 1950-12-11  Admit date: 11/12/2023 Date of Consult: 11/12/2023  PCP:  Brandy Senior, MD   [facility] Providers Cardiologist:  Brandy Emmer, MD    Patient Profile: Brandy Townsend is a 73 y.o. 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  History of Present Illness: Brandy Townsend is a 73 year old female with the above history who is followed by Dr. Emmer.  She has a history of atypical chest pain in the past and has had multiple different ischemic evaluations.  Last ischemic evaluation was a cardiac catheterization in 12/2019 which showed 70% stenosis and a tiny ramus branch, 60% stenosis of OM1, 55% stenosis of proximal to mid RCA, 40% stenosis of first diagonal, and 20% stenosis of mid LAD.  Medical therapy was recommended.  Last echo in 07/2021 showed LVEF of 55-60% with no normal wall motion and diastolic function, normal RV function, and no significant valvular disease.  Monitor in 07/2021 for further evaluation of palpitations showed 57 short runs of SVT (longest run 17 beats) and rare PACs/PVCs.  She was very to EP following this and seen by Brandy Townsend who recommended watchful waiting.  She was last seen by Brandy Alberts, NP, at which time she reported fatigue and occasional gasping sensation which she felt was likely due to her asthma.  Overall, it sounds like she was stable from a cardiac standpoint.  She was advised to decrease her Verapamil  dose to see if that helped her fatigue.  Patient was seen in the ED on 11/08/2023 for further evaluation of chest chest tightness that woke her up from sleep and radiated to her neck.  EKG showed no acute ischemic changes and  high-sensitivity troponin was negative x2.  Other labs were unremarkable and she was felt to be stable for discharge.  She presented back to the ED earlier this morning via EMS again for further evaluation of chest pain that woke her up from sleep.  EKG showed sinus bradycardia, rate 59 bpm, with low voltage QRS but no acute ischemic changes.  High-sensitivity troponin negative x2. Chest x-ray shows no acute findings. WBC 8.8, Hgb 11.5, Plts 269. Na 142, K 3.8, Glucose 114, BUN 24, Cr 0.91.  Patient was admitted to the Internal Medicine service and Cardiology consulted for further evaluation.  She tells me she wakes up around 1 AM with intense sharp chest discomfort that been results in pressure in her chest she feels like someone is pressing on her.  Symptoms lasted for 7 minutes.  They simply wake her up from sleep.  She goes to the gym and exercises and cannot reproduce the discomfort.  However given that she has had 2 episodes in the middle of the night she was a bit alarmed.  Cardiac workup negative.  CV exam normal.  No infectious symptoms.  No positional component.  No association with food.  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  She seems to control her stress level well.   Past Medical History:  Diagnosis Date   Asthma    CAD (coronary artery disease)    Diverticulosis    Graves disease    s/p  RAI   High cholesterol    Hypertension    Hypothyroidism    Nephrolithiasis    Osteoporosis    Rheumatoid arthritis (HCC)    Vertigo     Past Surgical History:  Procedure Laterality Date   BRONCHIAL WASHINGS  09/12/2019   Procedure: BRONCHIAL WASHINGS;  Surgeon: Geronimo Amel, MD;  Location: [facility];  Service: Endoscopy;;   CORONARY PRESSURE/FFR STUDY N/A 01/08/2020   Procedure: INTRAVASCULAR PRESSURE WIRE/FFR STUDY;  Surgeon: Swaziland, Peter M, MD;  Location: [facility] INVASIVE CV LAB;  Service: Cardiovascular;   Laterality: N/A;   INNER EAR SURGERY Right 02/27/2023   LEFT HEART CATH AND CORONARY ANGIOGRAPHY N/A 01/08/2020   Procedure: LEFT HEART CATH AND CORONARY ANGIOGRAPHY;  Surgeon: Swaziland, Peter M, MD;  [facility]: [facility] INVASIVE CV LAB;  Service: Cardiovascular;  Laterality: N/A;   TYMPANOPLASTY Right 02/27/2023   Procedure: TYMPANOPLASTY;  Surgeon: Jesus Oliphant, MD;  Location:  SURGERY CENTER;  Service: ENT;  Laterality: Right;   VIDEO BRONCHOSCOPY N/A 09/12/2019   Procedure: VIDEO BRONCHOSCOPY WITHOUT FLUORO;  Surgeon: Geronimo Amel, MD;  Location: [facility] END[facility];  Service: Endoscopy;  Laterality: N/A;     Home Medications:  Prior to Admission medications   Medication Sig Start Date End Date Taking? Authorizing Provider  abatacept  (ORENCIA ) 250 MG injection Inject 250 mg into the vein See admin instructions. Inject 250 mg IV once every month.   Yes [provider]  aspirin  EC 81 MG tablet Take 1 tablet (81 mg total) by mouth daily. Swallow whole. 12/17/19  Yes Army Katheryn BROCKS, NP  budeson-glycopyrrolate -formoterol  (BREZTRI  AEROSPHERE) 160-9-4.8 MCG/ACT AERO Inhale 2 puffs into the lungs in the morning and at bedtime. 06/29/23  Yes Geronimo Amel, MD  cetirizine (ZYRTEC) 10 MG tablet Take 10 mg by mouth daily.   Yes [provider]  diclofenac  Sodium (VOLTAREN ) 1 % GEL Apply 4 g topically 4 (four) times daily. Patient taking differently: Apply 4 g topically daily as needed (for RA). 11/12/22  Yes Palumbo, April, MD  levothyroxine  (SYNTHROID ) 75 MCG tablet Take 1 tablet (75 mcg total) by mouth every morning on an empty stomach 05/04/23  Yes   montelukast  (SINGULAIR ) 10 MG tablet Take 1 tablet (10 mg total) by mouth daily at night 04/14/23  Yes   Multiple Vitamins-Minerals (EQ MULTIVITAMINS ADULT GUMMY PO) Take 1 each by mouth daily.   Yes [provider]  nitroGLYCERIN  (NITROSTAT ) 0.4 MG SL tablet Place 1 tablet (0.4 mg total) under the tongue every 5 (five) minutes  as needed for chest pain. 07/25/23  Yes Wyn Brandy Townsend., NP  potassium chloride  (KLOR-CON ) 10 MEQ tablet Take 1 tablet (10 mEq total) by mouth daily. 04/14/23  Yes   rosuvastatin  (CRESTOR ) 10 MG tablet Take 1 tablet (10 mg total) by mouth daily. 09/25/23  Yes Delford Brandy BROCKS, MD  torsemide  (DEMADEX ) 20 MG tablet Take 1 tablet (20 mg total) by mouth in the morning AND ONE-HALF tablet (10 mg total) every evening. 06/19/23  Yes   Turmeric (QC TUMERIC COMPLEX PO) Take 2 tablets by mouth daily.   Yes [provider]  verapamil  (CALAN -SR) 120 MG CR tablet TAKE 1 TABLET (120 MG TOTAL) BY MOUTH DAILY AS NEEDED. 08/25/23  Yes Wyn Brandy Townsend., NP  Vitamin D, Ergocalciferol, (DRISDOL) 1.25 MG (50000 UNIT) CAPS capsule Take 50,000 Units by mouth every 7 (seven) days. Monday   Yes [provider]  albuterol  (VENTOLIN  HFA) 108 (90 Base) MCG/ACT inhaler Inhale 1-2 puffs into  the lungs every 6 (six) hours as needed for wheezing or shortness of breath. Patient not taking: Reported on 11/12/2023 12/02/21   Parrett, Madelin RAMAN, NP    Scheduled Meds:  aspirin  EC  81 mg Oral Daily   budesonide -glycopyrrolate -formoterol   2 puff Inhalation BID   enoxaparin  (LOVENOX ) injection  40 mg Subcutaneous Q24H   levothyroxine   75 mcg Oral q morning   montelukast   10 mg Oral Daily   rosuvastatin   10 mg Oral Daily   sodium chloride  flush  3 mL Intravenous Q12H   sodium chloride  flush  3 mL Intravenous Q12H   verapamil   240 mg Oral Daily   Continuous Infusions:  sodium chloride      PRN Meds: sodium chloride , acetaminophen  **OR** acetaminophen , levalbuterol , nitroGLYCERIN , ondansetron  **OR** ondansetron  (ZOFRAN ) IV, sodium chloride  flush  Allergies:    Allergies  Allergen Reactions   Cat Dander     Other reaction(s): congestion   Dust Mite Extract     Other reaction(s): Unknown   Morphine  And Codeine Nausea And Vomiting   Macrobid [Nitrofurantoin Monohyd Macro] Other (See Comments)    Lowered potassium,  caused aches and pains    Social History:   Social History   Socioeconomic History   Marital status: Married    Spouse name: Alm   Number of children: 2   Years of education: Not on file   Highest education level: Not on file  Occupational History   Occupation: retired    Comment: Haematologist  Tobacco Use   Smoking status: Former    Current packs/day: 0.00    Average packs/day: 1 pack/day for 20.0 years (20.0 ttl pk-yrs)    Types: Cigarettes    Start date: 08/26/1988    Quit date: 08/26/2008    Years since quitting: 15.2   Smokeless tobacco: Never   Tobacco comments:    social smoker  Vaping Use   Vaping status: Never Used  Substance and Sexual Activity   Alcohol use: No   Drug use: No   Sexual activity: Not Currently  Other Topics Concern   Not on file  Social History Narrative   Not on file   Social Drivers of Health   Financial Resource Strain: Not on file  Food Insecurity: Low Risk  (09/13/2022)   Received from [facility]   Hunger Vital Sign    Within the past 12 months, you worried that your food would run out before you got money to buy more: Never true    Within the past 12 months, the food you bought just didn't last and you didn't have money to get more. : Never true  Transportation Needs: Not on file (09/13/2022)  Physical Activity: Not on file  Stress: Not on file  Social Connections: Not on file  Intimate Partner Violence: Not on file    Family History:    Family History  Problem Relation Age of Onset   Breast cancer Mother    Heart disease Father    Cancer Daughter 96       stage 3 breast      ROS:  Please see the history of present illness.   All other ROS reviewed and negative.     Physical Exam/Data: Vitals:   11/12/23 0355 11/12/23 0400 11/12/23 0415 11/12/23 0445  BP: (!) 124/90 138/76 120/61 127/74  Pulse: 65 64 69 61  Resp: 20 (!) 21 14 15   Temp: 98.1 F (36.7 C)     TempSrc: Temporal  SpO2: 99% 100% 92%  94%  Weight:      Height:       No intake or output data in the 24 hours ending 11/12/23 0747    11/12/2023    3:53 AM 11/08/2023    4:30 AM 10/05/2023    6:37 PM  Last 3 Weights  Weight (lbs) 148 lb 13 oz 149 lb 149 lb  Weight (kg) 67.5 kg 67.586 kg 67.586 kg     Body mass index is 26.36 kg/m.  General:  Well nourished, well developed, in no acute distress HEENT: normal Neck: no JVD Vascular: No carotid bruits; Distal pulses 2+ bilaterally Cardiac:  normal S1, S2; RRR; no murmur  Lungs:  clear to auscultation bilaterally, no wheezing, rhonchi or rales  Abd: soft, nontender, no hepatomegaly  Ext: no edema Musculoskeletal:  No deformities, BUE and BLE strength normal and equal Skin: warm and dry  Neuro:  CNs 2-12 intact, no focal abnormalities noted Psych:  Normal affect   EKG:  The EKG was personally reviewed and demonstrates:   Sinus bradycardia, rate 59 bpm, with low voltage QRS but no acute ischemic changes.  Telemetry:  Telemetry was personally reviewed and demonstrates:  SR  s Relevant CV Studies: LHC 01/08/2020 Mid LAD lesion is 20% stenosed. 1st Diag lesion is 40% stenosed. Ramus lesion is 70% stenosed. 1st Mrg lesion is 60% stenosed. Prox RCA to Mid RCA lesion is 55% stenosed. The left ventricular systolic function is normal. LV end diastolic pressure is normal. The left ventricular ejection fraction is 55-65% by visual estimate.   1. Nonobstructive CAD. There is modest disease in the mid RCA with normal DFR. Modest disease in the first OM. The ramus branch is tiny.  2. Normal LV function 3. Normal LVEDP.    Laboratory Data: High Sensitivity Troponin:   Recent Labs  Lab 11/08/23 0448 11/08/23 0744 11/12/23 0427 11/12/23 0626  TROPONINIHS 2 2 3 3      Chemistry Recent Labs  Lab 11/08/23 0448 11/12/23 0427  NA 140 142  K 3.7 3.8  CL 107 112*  CO2 24 21*  GLUCOSE 124* 114*  BUN 18 24*  CREATININE 0.77 0.91  CALCIUM  9.2 8.8*  GFRNONAA >60 >60   ANIONGAP 9 9    No results for input(s): PROT, ALBUMIN, AST, ALT, ALKPHOS, BILITOT in the last 168 hours. Lipids No results for input(s): CHOL, TRIG, HDL, LABVLDL, LDLCALC, CHOLHDL in the last 168 hours.  Hematology Recent Labs  Lab 11/08/23 0448 11/12/23 0427  WBC 8.6 8.8  RBC 4.25 3.91  HGB 12.4 11.5*  HCT 38.4 35.7*  MCV 90.4 91.3  MCH 29.2 29.4  MCHC 32.3 32.2  RDW 13.4 13.5  PLT 322 269   Thyroid  No results for input(s): TSH, FREET4 in the last 168 hours.  BNPNo results for input(s): BNP, PROBNP in the last 168 hours.  DDimer No results for input(s): DDIMER in the last 168 hours.  Radiology/Studies:  DG Chest Portable 1 View Result Date: 11/12/2023 EXAM: 1 VIEW XRAY OF THE CHEST 11/12/2023 04:14:00 AM COMPARISON: 1 view chest x-ray 11/08/2023. CLINICAL HISTORY: CP FINDINGS: LUNGS AND PLEURA: Chronic changes of COPD are present. Aeration at the lung bases has improved. No focal pulmonary opacity. No pulmonary edema. No pleural effusion. No pneumothorax. HEART AND MEDIASTINUM: No acute abnormality of the cardiac and mediastinal silhouettes. BONES AND SOFT TISSUES: No acute osseous abnormality. IMPRESSION: 1. No acute process. 2. Chronic changes of COPD. Aeration at the lung bases has  improved. Electronically signed by: Lonni Necessary MD 11/12/2023 04:35 AM EDT RP Workstation: [id number]     Assessment and Plan:  # Unstable Angina # CAD - She presents again with recurrent episodes of chest discomfort at night.  I do wonder if she has sleep apnea that is triggering angina.  Given that symptoms are occurring in the overnight I bit worried about unstable angina.  I reviewed her left heart catheterization from 2021.  She had diffuse disease.  70% ramus, 60% OM1, 55% proximal RCA.  She has been managed medically since that time. - EKG is nonischemic.  Troponins are negative x 2 which is reassuring.  However given severity of symptoms we will  proceed with left heart catheterization tomorrow.  Working diagnosis is unstable angina.  N.p.o. at midnight.  Risk and benefits explained.  She is willing to proceed. - No need for IV heparin  given negative troponins. - Continue home medications.  Monitor on telemetry. - Reorder echo. - Sublingual nitro as needed for discomfort.  Continue Crestor  10 mg daily.  Check TSH and lipids tomorrow. - continue home verapamil  as well.  Informed Consent   Shared Decision Making/Informed Consent The risks [stroke (1 in 1000), death (1 in 1000), kidney failure [usually temporary] (1 in 500), bleeding (1 in 200), allergic reaction [possibly serious] (1 in 200)], benefits (diagnostic support and management of coronary artery disease) and alternatives of a cardiac catheterization were discussed in detail with Ms. Strough and she is willing to proceed.      For questions or updates, please contact [facility] Please consult [email or web address] for contact info under   Signed, Darryle T. Barbaraann, MD, [facility]  [facility]  [address] [place], [place] [address] [phone]  11:00 AM

## 2023-11-12 NOTE — H&P (Signed)
 History and Physical    Brandy Townsend:[id number] DOB: 10-22-1950 DOA: 11/12/2023  PCP: Nichole Senior, MD   Patient coming from: Home   Chief Complaint:  Chief Complaint  Patient presents with   Chest Pain   ED TRIAGE note:Pt bib [facility] from home complaining of centralized chest pain that woke her up at 0100. Pt had the same pain last week so she took 1 NTG and 324mg  ASA at home.   HPI:  Brandy Townsend is a 73 y.o. female with medical history significant of essential hypertension, hyperlipidemia, CAD, allergic rhinitis, hypothyroidism, mixed connective tissue disorder, asthma, diverticulosis, vertigo, Graves' disease, and rheumatoid arthritis presented to emergency department with complaining of chest tightness.  Patient stated that she was sleeping and woke up in the middle of the night with chest tightness 9 out of 10 in intensity with radiation to the neck and back. Patient reported that she experienced mid substernal chest pain chest pressure while she was sleeping around 1 AM and woke up from sleep due to significant chest pain with radiation to the back and neck.  Patient denies any nausea and diaphoresis.  Patient reported she took total 4 baby aspirin  and sublingual nitroglycerin  however chest pain did not improve.  The ED when she received fentanyl  that time chest pain finally improved to 5/10.  Patient denies any palpitation, shortness of breath, nausea, vomiting and diaphoresis.  Patient reported she has 1 more episode of chest pain on Tuesday night around same time while she was sleeping.  The chest pain is most likely chest pressure and tightness in quality and that time she checked her blood pressure it was elevated but today the blood pressure was within good range.  Patient reported that at baseline she is very active lifestyle and goes to gym do low intensity take exercise without any chest pain or pressure.  Since the heart cath 2021 this is patient's total 3 episodes of  chest pain.  Patient is stating that her husband had history of cancer with multiple comorbidities and she is the primary caregiver however she is not stressed out at all given she has been dealing with the situation for a long time and handling everything appropriately.  Per chart review patient has history of heart cath in 2021 following abnormal CTA chest in the setting of shortness of breath and chest pain.  Heart cath showed multivessel nonobstructive CAD and patient is on medical management.   At presentation to ED patient is hemodynamically stable. CBC unremarkable normal WBC count, stable H&H normal platelet count. BMP unremarkable except low bicarb 21. Troponin 3. EKG showed normal sinus rhythm heart rate 59.  Chest x-ray no active disease process.  Chronic changes of COPD.  In the ED patient has been given fentanyl  and another dose of aspirin  324 mg.  Hospitalist has been consulted for further management of chest pain. Consulted cardiology Dr. Cesario for evaluation for chest pain.  Significant labs in the ED:  heart rate 59.Lab Orders         Basic metabolic panel         CBC         CBC         Comprehensive metabolic panel       Review of Systems:  Review of Systems  Constitutional:  Negative for chills, fever, malaise/fatigue and weight loss.  Respiratory:  Negative for cough, sputum production and shortness of breath.   Cardiovascular:  Negative for chest pain, palpitations  and orthopnea.  Gastrointestinal:  Negative for heartburn, nausea and vomiting.  Musculoskeletal:  Negative for back pain, myalgias and neck pain.  Neurological:  Negative for dizziness and headaches.  Psychiatric/Behavioral:  The patient is not nervous/anxious.     Past Medical History:  Diagnosis Date   Asthma    CAD (coronary artery disease)    Diverticulosis    Graves disease    s/p RAI   High cholesterol    Hypertension    Hypothyroidism    Nephrolithiasis    Osteoporosis     Rheumatoid arthritis (HCC)    Vertigo     Past Surgical History:  Procedure Laterality Date   BRONCHIAL WASHINGS  09/12/2019   Procedure: BRONCHIAL WASHINGS;  Surgeon: Geronimo Amel, MD;  Location: [facility];  Service: Endoscopy;;   CORONARY PRESSURE/FFR STUDY N/A 01/08/2020   Procedure: INTRAVASCULAR PRESSURE WIRE/FFR STUDY;  Surgeon: Swaziland, Peter M, MD;  Location: [facility]AS[facility];  Service: Cardiovascular;  Laterality: N/A;   INNER EAR SURGERY Right 02/27/2023   LEFT HEART CATH AND CORONARY ANGIOGRAPHY N/A 01/08/2020   Procedure: LEFT HEART CATH AND CORONARY ANGIOGRAPHY;  Surgeon: Swaziland, Peter M, MD;  Location: [facility] INVASIVE CV LAB;  Service: Cardiovascular;  Laterality: N/A;   TYMPANOPLASTY Right 02/27/2023   Procedure: TYMPANOPLASTY;  Surgeon: Jesus Oliphant, MD;  Location: [facility];  Service: ENT;  Laterality: Right;   VIDEO BRONCHOSCOPY N/A 09/12/2019   Procedure: VIDEO BRONCHOSCOPY WITHOUT FLUORO;  Surgeon: Geronimo Amel, MD;  Location: [facility];  Service: Endoscopy;  Laterality: N/A;     reports that she quit smoking about 15 years ago. Her smoking use included cigarettes. She started smoking about 35 years ago. She has a 20 pack-year smoking history. She has never used smokeless tobacco. She reports that she does not drink alcohol and does not use drugs.  Allergies  Allergen Reactions   Cat Dander     Other reaction(s): congestion   Dust Mite Extract     Other reaction(s): Unknown   Morphine  And Codeine Nausea And Vomiting   Macrobid [Nitrofurantoin Monohyd Macro] Other (See Comments)    Lowered potassium, caused aches and pains    Family History  Problem Relation Age of Onset   Breast cancer Mother    Heart disease Father    Cancer Daughter 77       stage 3 breast     Prior to Admission medications   Medication Sig Start Date End Date Taking? Authorizing Provider  abatacept  (ORENCIA ) 250 MG injection Inject 250 mg into the vein See  admin instructions. Inject 250 mg IV once every month.    [provider]  albuterol  (VENTOLIN  HFA) 108 (90 Base) MCG/ACT inhaler Inhale 1-2 puffs into the lungs every 6 (six) hours as needed for wheezing or shortness of breath. 12/02/21   Parrett, Madelin RAMAN, NP  aspirin  EC 81 MG tablet Take 1 tablet (81 mg total) by mouth daily. Swallow whole. 12/17/19   Army Katheryn BROCKS, NP  budeson-glycopyrrolate -formoterol  (BREZTRI  AEROSPHERE) 160-9-4.8 MCG/ACT AERO Inhale 2 puffs into the lungs in the morning and at bedtime. 06/29/23   Geronimo Amel, MD  cetirizine (ZYRTEC) 10 MG tablet Take 10 mg by mouth at bedtime as needed for allergies.     [provider]  ciprofloxacin -dexamethasone  (CIPRODEX ) OTIC suspension Place 4 drops into the right ear 2 (two) times daily. 02/26/23     diclofenac  Sodium (VOLTAREN ) 1 % GEL Apply 4 g topically 4 (four) times daily. 11/12/22  Palumbo, April, MD  KLOR-CON  10 10 MEQ tablet Take 10 mEq by mouth daily. 08/11/14   [provider]  levothyroxine  (SYNTHROID ) 75 MCG tablet Take 75 mcg by mouth daily before breakfast.    [provider]  levothyroxine  (SYNTHROID ) 75 MCG tablet Take 1 tablet (75 mcg total) by mouth every morning on an empty stomach 05/04/23     montelukast  (SINGULAIR ) 10 MG tablet Take 10 mg by mouth daily. 11/17/20   [provider]  montelukast  (SINGULAIR ) 10 MG tablet Take 1 tablet (10 mg total) by mouth daily at night 04/14/23     Multiple Vitamin (MULTIVITAMIN) tablet Take 1 tablet by mouth daily.    [provider]  Multiple Vitamins-Minerals (EQ MULTIVITAMINS ADULT GUMMY PO) Take 1 each by mouth daily.    [provider]  nitroGLYCERIN  (NITROSTAT ) 0.4 MG SL tablet Place 1 tablet (0.4 mg total) under the tongue every 5 (five) minutes as needed for chest pain. 07/25/23   Wyn Jackee VEAR Mickey., NP  potassium chloride  (KLOR-CON ) 10 MEQ tablet Take 1 tablet (10 mEq total) by mouth daily. 04/14/23     Probiotic  Product (PROBIOTIC GUMMIES PO) Take by mouth daily as needed.    [provider]  rosuvastatin  (CRESTOR ) 10 MG tablet Take 1 tablet (10 mg total) by mouth daily. 09/25/23   Delford Maude BROCKS, MD  torsemide  (DEMADEX ) 20 MG tablet Take 1 tablet (20 mg total) by mouth daily. 05/15/19   Army Katheryn BROCKS, NP  torsemide  (DEMADEX ) 20 MG tablet Take 1 tablet (20 mg total) by mouth in the morning AND ONE-HALF tablet (10 mg total) every evening. 06/19/23     Turmeric (QC TUMERIC COMPLEX PO) Take by mouth daily.    [provider]  verapamil  (CALAN -SR) 120 MG CR tablet TAKE 1 TABLET (120 MG TOTAL) BY MOUTH DAILY AS NEEDED. 08/25/23   Wyn Jackee VEAR Mickey., NP  verapamil  (CALAN -SR) 240 MG CR tablet Take 1 tablet (240 mg total) by mouth daily. 09/25/23   Nishan, Peter C, MD  Vitamin D, Ergocalciferol, (DRISDOL) 1.25 MG (50000 UNIT) CAPS capsule Take 50,000 Units by mouth every 7 (seven) days. Monday    [provider]     Physical Exam: Vitals:   11/12/23 0355 11/12/23 0400 11/12/23 0415 11/12/23 0445  BP: (!) 124/90 138/76 120/61 127/74  Pulse: 65 64 69 61  Resp: 20 (!) 21 14 15   Temp: 98.1 F (36.7 C)     TempSrc: Temporal     SpO2: 99% 100% 92% 94%  Weight:      Height:        Physical Exam Vitals and nursing note reviewed.  Constitutional:      General: She is not in acute distress.    Appearance: She is not ill-appearing.  Eyes:     Pupils: Pupils are equal, round, and reactive to light.  Cardiovascular:     Rate and Rhythm: Normal rate and regular rhythm.     Heart sounds: Normal heart sounds. No murmur heard.    No friction rub. No gallop.  Pulmonary:     Breath sounds: Normal breath sounds.  Chest:     Chest wall: Tenderness present. No mass, deformity, crepitus or edema.     Comments: Left-sided substernal area tenderness on palpation Musculoskeletal:     Cervical back: Normal range of motion and neck supple.     Right lower leg: No edema.     Left lower leg:  No edema.  Skin:    General: Skin is dry.     Capillary Refill: Capillary refill takes less than 2 seconds.  Neurological:     Mental Status: She is alert and oriented to person, place, and time.      Labs on Admission: I have personally reviewed following labs and imaging studies  CBC: Recent Labs  Lab 11/08/23 0448 11/12/23 0427  WBC 8.6 8.8  NEUTROABS 4.6  --   HGB 12.4 11.5*  HCT 38.4 35.7*  MCV 90.4 91.3  PLT 322 269   Basic Metabolic Panel: Recent Labs  Lab 11/08/23 0448 11/12/23 0427  NA 140 142  K 3.7 3.8  CL 107 112*  CO2 24 21*  GLUCOSE 124* 114*  BUN 18 24*  CREATININE 0.77 0.91  CALCIUM  9.2 8.8*   GFR: Estimated Creatinine Clearance: 52.3 mL/min (by C-G formula based on SCr of 0.91 mg/dL). Liver Function Tests: No results for input(s): AST, ALT, ALKPHOS, BILITOT, PROT, ALBUMIN in the last 168 hours. No results for input(s): LIPASE, AMYLASE in the last 168 hours. No results for input(s): AMMONIA in the last 168 hours. Coagulation Profile: No results for input(s): INR, PROTIME in the last 168 hours. Cardiac Enzymes: Recent Labs  Lab 11/08/23 0448 11/08/23 0744 11/12/23 0427  TROPONINIHS 2 2 3    BNP (last 3 results) No results for input(s): BNP in the last 8760 hours. HbA1C: No results for input(s): HGBA1C in the last 72 hours. CBG: No results for input(s): GLUCAP in the last 168 hours. Lipid Profile: No results for input(s): CHOL, HDL, LDLCALC, TRIG, CHOLHDL, LDLDIRECT in the last 72 hours. Thyroid  Function Tests: No results for input(s): TSH, T4TOTAL, FREET4, T3FREE, THYROIDAB in the last 72 hours. Anemia Panel: No results for input(s): VITAMINB12, FOLATE, FERRITIN, TIBC, IRON, RETICCTPCT in the last 72 hours. Urine analysis:    Component Value Date/Time   COLORURINE STRAW (A) 02/28/2023 1054   APPEARANCEUR CLEAR 02/28/2023 1054   LABSPEC 1.015 02/28/2023 1054   PHURINE 6.0  02/28/2023 1054   GLUCOSEU NEGATIVE 02/28/2023 1054   HGBUR NEGATIVE 02/28/2023 1054   BILIRUBINUR NEGATIVE 02/28/2023 1054   KETONESUR NEGATIVE 02/28/2023 1054   PROTEINUR NEGATIVE 02/28/2023 1054   NITRITE NEGATIVE 02/28/2023 1054   LEUKOCYTESUR NEGATIVE 02/28/2023 1054    Radiological Exams on Admission: I have personally reviewed images DG Chest Portable 1 View Result Date: 11/12/2023 EXAM: 1 VIEW XRAY OF THE CHEST 11/12/2023 04:14:00 AM COMPARISON: 1 view chest x-ray 11/08/2023. CLINICAL HISTORY: CP FINDINGS: LUNGS AND PLEURA: Chronic changes of COPD are present. Aeration at the lung bases has improved. No focal pulmonary opacity. No pulmonary edema. No pleural effusion. No pneumothorax. HEART AND MEDIASTINUM: No acute abnormality of the cardiac and mediastinal silhouettes. BONES AND SOFT TISSUES: No acute osseous abnormality. IMPRESSION: 1. No acute process. 2. Chronic changes of COPD. Aeration at the lung bases has improved. Electronically signed by: Lonni Necessary MD 11/12/2023 04:35 AM EDT RP Workstation: [id number]     EKG: My personal interpretation of EKG shows: Normal sinus rhythm heart rate 59.  There is no ST-T wave abnormality.  Assessment/Plan: Principal Problem:   Unstable angina (HCC) Active Problems:   Essential hypertension   Rheumatoid arthritis (HCC)   Anxiety   Asthma, chronic   History of CAD (coronary artery disease)   Connective tissue disorder (HCC)   Hypothyroidism    Assessment and Plan: Unstable angina vs costochondritis History of nonobstructive multivessel CAD -Patient presented to emergency department complaint of mid substernal chest pain//pressure/tightness  that woke her up during sleep 9/10 intensity with radiation to the neck and back.  Patient reported associated nausea.  Patient denies any shortness of breath, palpitation, and diaphoresis. Patient took sublingual nitro glycerin and baby aspirin  total 4 doses without any improvement of  chest pain and at the end in the ED patient received fentanyl  that improved with chest pain. - Currently patient is chest pain-free however physical exam revealed reproducible chest pain to left substernal area. -Concern for unstable angina versus osteochondritis in the setting of underlying rheumatoid arthritis. -Given patient multiple risk factor including hypertension, hyperlipidemia and nonobstructive CAD it is better to do further investigation regarding the chest pain. - At presentation to ED patient is hemodynamically stable. - Normal troponin level and EKG showing normal sinus rhythm without any ST-T wave abnormality.  Chest x-ray evidence of COPD. - Patient has history of previous nonobstructive CAD and last heart cath in 12/2019 showing multivessel nonobstructive CAD on medical management. - CBC and BMP unremarkable. -Previous echo from 07/2021 showed preserved ejection fraction 55 to 60% and normal left ventricular heart strain.  Normal diastolic parameters. - Continue to trend troponin. - Obtaining echocardiogram to assess for any wall motion abnormality. - Continue aspirin , Lipitor and verapamil . -Consult to cardiology for further evaluation.   -Continue sublingual nitroglycerin . -Continue cardiac monitoring.   Essential hypertension - Continue verapamil . Holding torsemide  as physical exam showing patient is euvolemic and chest x-ray no evidence of pulmonary vascular congestion.  History of asthma - Continue albuterol  nebulizer as needed, Breztri  twice daily and Singulair .  Hypothyroidism - Continue thyroxine  Mixed connective  tissue disorder Rheumatoid arthritis - Patient follows [facility] rheumatology.  Currently on Orencia  IV infusion.    DVT prophylaxis:  Lovenox  Code Status:  Full Code Diet: Heart healthy diet Family Communication:   Family was present at bedside, at the time of interview. Opportunity was given to ask question and all questions were answered  satisfactorily.  Disposition Plan: Continue to trend troponin and follow-up with echo.  Pending cardiology evaluation Consults: Cardiology Admission status:   Observation, Telemetry bed  Severity of Illness: The appropriate patient status for this patient is OBSERVATION. Observation status is judged to be reasonable and necessary in order to provide the required intensity of service to ensure the patient's safety. The patient's presenting symptoms, physical exam findings, and initial radiographic and laboratory data in the context of their medical condition is felt to place them at decreased risk for further clinical deterioration. Furthermore, it is anticipated that the patient will be medically stable for discharge from the hospital within 2 midnights of admission.     Sawyer Mentzer, MD [facility]  How to contact the [facility] Attending or Consulting provider 7A - 7P or covering provider during after hours 7P -7A, for this patient.  Check the care team in [facility] and look for a) attending/consulting [facility] provider listed and b) the [facility] team listed Log into [email or web address] and use [facility]'s universal password to access. If you do not have the password, please contact the hospital operator. Locate the [facility] provider you are looking for under [facility] and page to a number that you can be directly reached. If you still have difficulty reaching the provider, please page the [facility] (Director on Call) for the Hospitalists listed on amion for assistance.  11/12/2023, 6:23 AM

## 2023-11-12 NOTE — H&P (View-Only) (Signed)
 Cardiology Consultation   Patient ID: [name] MRN: [id number]; DOB: 1950-12-11  Admit date: 11/12/2023 Date of Consult: 11/12/2023  PCP:  Nichole Senior, MD   [facility] Providers Cardiologist:  Maude Emmer, MD    Patient Profile: Brandy Townsend is a 73 y.o. 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 Sundil.  History of Present Illness: Brandy Townsend is a 73 year old female with the above history who is followed by Dr. Emmer.  She has a history of atypical chest pain in the past and has had multiple different ischemic evaluations.  Last ischemic evaluation was a cardiac catheterization in 12/2019 which showed 70% stenosis and a tiny ramus branch, 60% stenosis of OM1, 55% stenosis of proximal to mid RCA, 40% stenosis of first diagonal, and 20% stenosis of mid LAD.  Medical therapy was recommended.  Last echo in 07/2021 showed LVEF of 55-60% with no normal wall motion and diastolic function, normal RV function, and no significant valvular disease.  Monitor in 07/2021 for further evaluation of palpitations showed 57 short runs of SVT (longest run 17 beats) and rare PACs/PVCs.  She was very to EP following this and seen by Dr. Waddell who recommended watchful waiting.  She was last seen by Jackee Alberts, NP, at which time she reported fatigue and occasional gasping sensation which she felt was likely due to her asthma.  Overall, it sounds like she was stable from a cardiac standpoint.  She was advised to decrease her Verapamil  dose to see if that helped her fatigue.  Patient was seen in the ED on 11/08/2023 for further evaluation of chest chest tightness that woke her up from sleep and radiated to her neck.  EKG showed no acute ischemic changes and  high-sensitivity troponin was negative x2.  Other labs were unremarkable and she was felt to be stable for discharge.  She presented back to the ED earlier this morning via EMS again for further evaluation of chest pain that woke her up from sleep.  EKG showed sinus bradycardia, rate 59 bpm, with low voltage QRS but no acute ischemic changes.  High-sensitivity troponin negative x2. Chest x-ray shows no acute findings. WBC 8.8, Hgb 11.5, Plts 269. Na 142, K 3.8, Glucose 114, BUN 24, Cr 0.91.  Patient was admitted to the Internal Medicine service and Cardiology consulted for further evaluation.  She tells me she wakes up around 1 AM with intense sharp chest discomfort that been results in pressure in her chest she feels like someone is pressing on her.  Symptoms lasted for 7 minutes.  They simply wake her up from sleep.  She goes to the gym and exercises and cannot reproduce the discomfort.  However given that she has had 2 episodes in the middle of the night she was a bit alarmed.  Cardiac workup negative.  CV exam normal.  No infectious symptoms.  No positional component.  No association with food.  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  She seems to control her stress level well.   Past Medical History:  Diagnosis Date   Asthma    CAD (coronary artery disease)    Diverticulosis    Graves disease    s/p  RAI   High cholesterol    Hypertension    Hypothyroidism    Nephrolithiasis    Osteoporosis    Rheumatoid arthritis (HCC)    Vertigo     Past Surgical History:  Procedure Laterality Date   BRONCHIAL WASHINGS  09/12/2019   Procedure: BRONCHIAL WASHINGS;  Surgeon: Geronimo Amel, MD;  Location: [facility];  Service: Endoscopy;;   CORONARY PRESSURE/FFR STUDY N/A 01/08/2020   Procedure: INTRAVASCULAR PRESSURE WIRE/FFR STUDY;  Surgeon: Swaziland, Peter M, MD;  Location: [facility] INVASIVE CV LAB;  Service: Cardiovascular;   Laterality: N/A;   INNER EAR SURGERY Right 02/27/2023   LEFT HEART CATH AND CORONARY ANGIOGRAPHY N/A 01/08/2020   Procedure: LEFT HEART CATH AND CORONARY ANGIOGRAPHY;  Surgeon: Swaziland, Peter M, MD;  [facility]: [facility] INVASIVE CV LAB;  Service: Cardiovascular;  Laterality: N/A;   TYMPANOPLASTY Right 02/27/2023   Procedure: TYMPANOPLASTY;  Surgeon: Jesus Oliphant, MD;  Location:  SURGERY CENTER;  Service: ENT;  Laterality: Right;   VIDEO BRONCHOSCOPY N/A 09/12/2019   Procedure: VIDEO BRONCHOSCOPY WITHOUT FLUORO;  Surgeon: Geronimo Amel, MD;  Location: [facility];  Service: Endoscopy;  Laterality: N/A;     Home Medications:  Prior to Admission medications   Medication Sig Start Date End Date Taking? Authorizing Provider  abatacept  (ORENCIA ) 250 MG injection Inject 250 mg into the vein See admin instructions. Inject 250 mg IV once every month.   Yes [provider]  aspirin  EC 81 MG tablet Take 1 tablet (81 mg total) by mouth daily. Swallow whole. 12/17/19  Yes Army Katheryn BROCKS, NP  budeson-glycopyrrolate -formoterol  (BREZTRI  AEROSPHERE) 160-9-4.8 MCG/ACT AERO Inhale 2 puffs into the lungs in the morning and at bedtime. 06/29/23  Yes Geronimo Amel, MD  cetirizine (ZYRTEC) 10 MG tablet Take 10 mg by mouth daily.   Yes [provider]  diclofenac  Sodium (VOLTAREN ) 1 % GEL Apply 4 g topically 4 (four) times daily. Patient taking differently: Apply 4 g topically daily as needed (for RA). 11/12/22  Yes Palumbo, April, MD  levothyroxine  (SYNTHROID ) 75 MCG tablet Take 1 tablet (75 mcg total) by mouth every morning on an empty stomach 05/04/23  Yes   montelukast  (SINGULAIR ) 10 MG tablet Take 1 tablet (10 mg total) by mouth daily at night 04/14/23  Yes   Multiple Vitamins-Minerals (EQ MULTIVITAMINS ADULT GUMMY PO) Take 1 each by mouth daily.   Yes [provider]  nitroGLYCERIN  (NITROSTAT ) 0.4 MG SL tablet Place 1 tablet (0.4 mg total) under the tongue every 5 (five) minutes  as needed for chest pain. 07/25/23  Yes Wyn Jackee VEAR Mickey., NP  potassium chloride  (KLOR-CON ) 10 MEQ tablet Take 1 tablet (10 mEq total) by mouth daily. 04/14/23  Yes   rosuvastatin  (CRESTOR ) 10 MG tablet Take 1 tablet (10 mg total) by mouth daily. 09/25/23  Yes Delford Maude BROCKS, MD  torsemide  (DEMADEX ) 20 MG tablet Take 1 tablet (20 mg total) by mouth in the morning AND ONE-HALF tablet (10 mg total) every evening. 06/19/23  Yes   Turmeric (QC TUMERIC COMPLEX PO) Take 2 tablets by mouth daily.   Yes [provider]  verapamil  (CALAN -SR) 120 MG CR tablet TAKE 1 TABLET (120 MG TOTAL) BY MOUTH DAILY AS NEEDED. 08/25/23  Yes Wyn Jackee VEAR Mickey., NP  Vitamin D, Ergocalciferol, (DRISDOL) 1.25 MG (50000 UNIT) CAPS capsule Take 50,000 Units by mouth every 7 (seven) days. Monday   Yes [provider]  albuterol  (VENTOLIN  HFA) 108 (90 Base) MCG/ACT inhaler Inhale 1-2 puffs into  the lungs every 6 (six) hours as needed for wheezing or shortness of breath. Patient not taking: Reported on 11/12/2023 12/02/21   Parrett, Madelin RAMAN, NP    Scheduled Meds:  aspirin  EC  81 mg Oral Daily   budesonide -glycopyrrolate -formoterol   2 puff Inhalation BID   enoxaparin  (LOVENOX ) injection  40 mg Subcutaneous Q24H   levothyroxine   75 mcg Oral q morning   montelukast   10 mg Oral Daily   rosuvastatin   10 mg Oral Daily   sodium chloride  flush  3 mL Intravenous Q12H   sodium chloride  flush  3 mL Intravenous Q12H   verapamil   240 mg Oral Daily   Continuous Infusions:  sodium chloride      PRN Meds: sodium chloride , acetaminophen  **OR** acetaminophen , levalbuterol , nitroGLYCERIN , ondansetron  **OR** ondansetron  (ZOFRAN ) IV, sodium chloride  flush  Allergies:    Allergies  Allergen Reactions   Cat Dander     Other reaction(s): congestion   Dust Mite Extract     Other reaction(s): Unknown   Morphine  And Codeine Nausea And Vomiting   Macrobid [Nitrofurantoin Monohyd Macro] Other (See Comments)    Lowered potassium,  caused aches and pains    Social History:   Social History   Socioeconomic History   Marital status: Married    Spouse name: Alm   Number of children: 2   Years of education: Not on file   Highest education level: Not on file  Occupational History   Occupation: retired    Comment: Haematologist  Tobacco Use   Smoking status: Former    Current packs/day: 0.00    Average packs/day: 1 pack/day for 20.0 years (20.0 ttl pk-yrs)    Types: Cigarettes    Start date: 08/26/1988    Quit date: 08/26/2008    Years since quitting: 15.2   Smokeless tobacco: Never   Tobacco comments:    social smoker  Vaping Use   Vaping status: Never Used  Substance and Sexual Activity   Alcohol use: No   Drug use: No   Sexual activity: Not Currently  Other Topics Concern   Not on file  Social History Narrative   Not on file   Social Drivers of Health   Financial Resource Strain: Not on file  Food Insecurity: Low Risk  (09/13/2022)   Received from [facility]   Hunger Vital Sign    Within the past 12 months, you worried that your food would run out before you got money to buy more: Never true    Within the past 12 months, the food you bought just didn't last and you didn't have money to get more. : Never true  Transportation Needs: Not on file (09/13/2022)  Physical Activity: Not on file  Stress: Not on file  Social Connections: Not on file  Intimate Partner Violence: Not on file    Family History:    Family History  Problem Relation Age of Onset   Breast cancer Mother    Heart disease Father    Cancer Daughter 96       stage 3 breast      ROS:  Please see the history of present illness.   All other ROS reviewed and negative.     Physical Exam/Data: Vitals:   11/12/23 0355 11/12/23 0400 11/12/23 0415 11/12/23 0445  BP: (!) 124/90 138/76 120/61 127/74  Pulse: 65 64 69 61  Resp: 20 (!) 21 14 15   Temp: 98.1 F (36.7 C)     TempSrc: Temporal  SpO2: 99% 100% 92%  94%  Weight:      Height:       No intake or output data in the 24 hours ending 11/12/23 0747    11/12/2023    3:53 AM 11/08/2023    4:30 AM 10/05/2023    6:37 PM  Last 3 Weights  Weight (lbs) 148 lb 13 oz 149 lb 149 lb  Weight (kg) 67.5 kg 67.586 kg 67.586 kg     Body mass index is 26.36 kg/m.  General:  Well nourished, well developed, in no acute distress HEENT: normal Neck: no JVD Vascular: No carotid bruits; Distal pulses 2+ bilaterally Cardiac:  normal S1, S2; RRR; no murmur  Lungs:  clear to auscultation bilaterally, no wheezing, rhonchi or rales  Abd: soft, nontender, no hepatomegaly  Ext: no edema Musculoskeletal:  No deformities, BUE and BLE strength normal and equal Skin: warm and dry  Neuro:  CNs 2-12 intact, no focal abnormalities noted Psych:  Normal affect   EKG:  The EKG was personally reviewed and demonstrates:   Sinus bradycardia, rate 59 bpm, with low voltage QRS but no acute ischemic changes.  Telemetry:  Telemetry was personally reviewed and demonstrates:  SR  s Relevant CV Studies: LHC 01/08/2020 Mid LAD lesion is 20% stenosed. 1st Diag lesion is 40% stenosed. Ramus lesion is 70% stenosed. 1st Mrg lesion is 60% stenosed. Prox RCA to Mid RCA lesion is 55% stenosed. The left ventricular systolic function is normal. LV end diastolic pressure is normal. The left ventricular ejection fraction is 55-65% by visual estimate.   1. Nonobstructive CAD. There is modest disease in the mid RCA with normal DFR. Modest disease in the first OM. The ramus branch is tiny.  2. Normal LV function 3. Normal LVEDP.    Laboratory Data: High Sensitivity Troponin:   Recent Labs  Lab 11/08/23 0448 11/08/23 0744 11/12/23 0427 11/12/23 0626  TROPONINIHS 2 2 3 3      Chemistry Recent Labs  Lab 11/08/23 0448 11/12/23 0427  NA 140 142  K 3.7 3.8  CL 107 112*  CO2 24 21*  GLUCOSE 124* 114*  BUN 18 24*  CREATININE 0.77 0.91  CALCIUM  9.2 8.8*  GFRNONAA >60 >60   ANIONGAP 9 9    No results for input(s): PROT, ALBUMIN, AST, ALT, ALKPHOS, BILITOT in the last 168 hours. Lipids No results for input(s): CHOL, TRIG, HDL, LABVLDL, LDLCALC, CHOLHDL in the last 168 hours.  Hematology Recent Labs  Lab 11/08/23 0448 11/12/23 0427  WBC 8.6 8.8  RBC 4.25 3.91  HGB 12.4 11.5*  HCT 38.4 35.7*  MCV 90.4 91.3  MCH 29.2 29.4  MCHC 32.3 32.2  RDW 13.4 13.5  PLT 322 269   Thyroid  No results for input(s): TSH, FREET4 in the last 168 hours.  BNPNo results for input(s): BNP, PROBNP in the last 168 hours.  DDimer No results for input(s): DDIMER in the last 168 hours.  Radiology/Studies:  DG Chest Portable 1 View Result Date: 11/12/2023 EXAM: 1 VIEW XRAY OF THE CHEST 11/12/2023 04:14:00 AM COMPARISON: 1 view chest x-ray 11/08/2023. CLINICAL HISTORY: CP FINDINGS: LUNGS AND PLEURA: Chronic changes of COPD are present. Aeration at the lung bases has improved. No focal pulmonary opacity. No pulmonary edema. No pleural effusion. No pneumothorax. HEART AND MEDIASTINUM: No acute abnormality of the cardiac and mediastinal silhouettes. BONES AND SOFT TISSUES: No acute osseous abnormality. IMPRESSION: 1. No acute process. 2. Chronic changes of COPD. Aeration at the lung bases has  improved. Electronically signed by: Lonni Necessary MD 11/12/2023 04:35 AM EDT RP Workstation: [id number]     Assessment and Plan:  # Unstable Angina # CAD - She presents again with recurrent episodes of chest discomfort at night.  I do wonder if she has sleep apnea that is triggering angina.  Given that symptoms are occurring in the overnight I bit worried about unstable angina.  I reviewed her left heart catheterization from 2021.  She had diffuse disease.  70% ramus, 60% OM1, 55% proximal RCA.  She has been managed medically since that time. - EKG is nonischemic.  Troponins are negative x 2 which is reassuring.  However given severity of symptoms we will  proceed with left heart catheterization tomorrow.  Working diagnosis is unstable angina.  N.p.o. at midnight.  Risk and benefits explained.  She is willing to proceed. - No need for IV heparin  given negative troponins. - Continue home medications.  Monitor on telemetry. - Reorder echo. - Sublingual nitro as needed for discomfort.  Continue Crestor  10 mg daily.  Check TSH and lipids tomorrow. - continue home verapamil  as well.  Informed Consent   Shared Decision Making/Informed Consent The risks [stroke (1 in 1000), death (1 in 1000), kidney failure [usually temporary] (1 in 500), bleeding (1 in 200), allergic reaction [possibly serious] (1 in 200)], benefits (diagnostic support and management of coronary artery disease) and alternatives of a cardiac catheterization were discussed in detail with Brandy Townsend and she is willing to proceed.      For questions or updates, please contact [facility] Please consult [email or web address] for contact info under   Signed, Darryle T. Barbaraann, MD, [facility]  [facility]  [address] [place], [place] [address] [phone]  11:00 AM

## 2023-11-12 NOTE — ED Triage Notes (Signed)
 Pt bib [facility] from home complaining of centralized chest pain that woke her up at 0100. Pt had the same pain last week so she took 1 NTG and 324mg  ASA at home.

## 2023-11-12 NOTE — ED Provider Notes (Signed)
 Brandy Townsend EMERGENCY DEPARTMENT AT [facility] Provider Note   CSN: [id number] Arrival date & time: 11/12/23  9652     Patient presents with: Chest Pain   Brandy Townsend is a 73 y.o. female.   The history is provided by the patient.  Patient w/history of asthma, hypertension, RA, CAD presents with chest pain.  Patient reports around 1 AM she woke up with sharp centralized chest pain that is now pressure.  She also reports some pain in her left shoulder.  She took aspirin  and nitroglycerin  with some relief. She reports mild shortness of breath and diaphoresis She had a similar episode just a few days ago and was seen in the ER but it improved.  Since that time she has seen her PCP Dr. Nichole and he recommended to follow-up with a cardiologist  The pain does not radiate to her back Past Medical History:  Diagnosis Date   Asthma    CAD (coronary artery disease)    Diverticulosis    Graves disease    s/p RAI   High cholesterol    Hypertension    Hypothyroidism    Nephrolithiasis    Osteoporosis    Rheumatoid arthritis (HCC)    Vertigo     Prior to Admission medications   Medication Sig Start Date End Date Taking? Authorizing Provider  abatacept  (ORENCIA ) 250 MG injection Inject 250 mg into the vein See admin instructions. Inject 250 mg IV once every month.    [provider]  albuterol  (VENTOLIN  HFA) 108 (90 Base) MCG/ACT inhaler Inhale 1-2 puffs into the lungs every 6 (six) hours as needed for wheezing or shortness of breath. 12/02/21   Parrett, Madelin RAMAN, NP  aspirin  EC 81 MG tablet Take 1 tablet (81 mg total) by mouth daily. Swallow whole. 12/17/19   Army Katheryn BROCKS, NP  budeson-glycopyrrolate -formoterol  (BREZTRI  AEROSPHERE) 160-9-4.8 MCG/ACT AERO Inhale 2 puffs into the lungs in the morning and at bedtime. 06/29/23   Geronimo Amel, MD  cetirizine (ZYRTEC) 10 MG tablet Take 10 mg by mouth at bedtime as needed for allergies.     [provider]   ciprofloxacin -dexamethasone  (CIPRODEX ) OTIC suspension Place 4 drops into the right ear 2 (two) times daily. 02/26/23     diclofenac  Sodium (VOLTAREN ) 1 % GEL Apply 4 g topically 4 (four) times daily. 11/12/22   Palumbo, April, MD  KLOR-CON  10 10 MEQ tablet Take 10 mEq by mouth daily. 08/11/14   [provider]  levothyroxine  (SYNTHROID ) 75 MCG tablet Take 75 mcg by mouth daily before breakfast.    [provider]  levothyroxine  (SYNTHROID ) 75 MCG tablet Take 1 tablet (75 mcg total) by mouth every morning on an empty stomach 05/04/23     montelukast  (SINGULAIR ) 10 MG tablet Take 10 mg by mouth daily. 11/17/20   [provider]  montelukast  (SINGULAIR ) 10 MG tablet Take 1 tablet (10 mg total) by mouth daily at night 04/14/23     Multiple Vitamin (MULTIVITAMIN) tablet Take 1 tablet by mouth daily.    [provider]  Multiple Vitamins-Minerals (EQ MULTIVITAMINS ADULT GUMMY PO) Take 1 each by mouth daily.    [provider]  nitroGLYCERIN  (NITROSTAT ) 0.4 MG SL tablet Place 1 tablet (0.4 mg total) under the tongue every 5 (five) minutes as needed for chest pain. 07/25/23   Wyn Jackee VEAR Mickey., NP  potassium chloride  (KLOR-CON ) 10 MEQ tablet Take 1 tablet (10 mEq total) by mouth daily. 04/14/23  Probiotic Product (PROBIOTIC GUMMIES PO) Take by mouth daily as needed.    [provider]  rosuvastatin  (CRESTOR ) 10 MG tablet Take 1 tablet (10 mg total) by mouth daily. 09/25/23   Delford Maude BROCKS, MD  torsemide  (DEMADEX ) 20 MG tablet Take 1 tablet (20 mg total) by mouth daily. 05/15/19   Army Katheryn BROCKS, NP  torsemide  (DEMADEX ) 20 MG tablet Take 1 tablet (20 mg total) by mouth in the morning AND ONE-HALF tablet (10 mg total) every evening. 06/19/23     Turmeric (QC TUMERIC COMPLEX PO) Take by mouth daily.    [provider]  verapamil  (CALAN -SR) 120 MG CR tablet TAKE 1 TABLET (120 MG TOTAL) BY MOUTH DAILY AS NEEDED. 08/25/23   Wyn Jackee VEAR Mickey., NP   verapamil  (CALAN -SR) 240 MG CR tablet Take 1 tablet (240 mg total) by mouth daily. 09/25/23   Nishan, Peter C, MD  Vitamin D, Ergocalciferol, (DRISDOL) 1.25 MG (50000 UNIT) CAPS capsule Take 50,000 Units by mouth every 7 (seven) days. Monday    [provider]    Allergies: Cat dander, Dust mite extract, Morphine  and codeine, and Macrobid [nitrofurantoin monohyd macro]    Review of Systems  Constitutional:  Positive for diaphoresis.  Respiratory:  Positive for shortness of breath.   Cardiovascular:  Positive for chest pain.  Gastrointestinal:  Negative for vomiting.    Updated Vital Signs BP 127/74   Pulse 61   Temp 98.1 F (36.7 C) (Temporal)   Resp 15   Ht 1.6 m (5' 3)   Wt 67.5 kg   SpO2 94%   BMI 26.36 kg/m   Physical Exam CONSTITUTIONAL: Well developed/well nourished, no distress HEAD: Normocephalic/atraumatic ENMT: Mucous membranes moist NECK: supple no meningeal signs CV: S1/S2 noted, no murmurs/rubs/gallops noted LUNGS: Lungs are clear to auscultation bilaterally, no apparent distress ABDOMEN: soft, nontender NEURO: Pt is awake/alert/appropriate, moves all extremitiesx4.  No facial droop.   EXTREMITIES: pulses normal/equalx4, full ROM SKIN: warm, color normal PSYCH: no abnormalities of mood noted, alert and oriented to situation  (all labs ordered are listed, but only abnormal results are displayed) Labs Reviewed  BASIC METABOLIC PANEL WITH GFR - Abnormal; Notable for the following components:      Result Value   Chloride 112 (*)    CO2 21 (*)    Glucose, Bld 114 (*)    BUN 24 (*)    Calcium  8.8 (*)    All other components within normal limits  CBC - Abnormal; Notable for the following components:   Hemoglobin 11.5 (*)    HCT 35.7 (*)    All other components within normal limits  TROPONIN I (HIGH SENSITIVITY)  TROPONIN I (HIGH SENSITIVITY)    EKG: EKG Interpretation Date/Time:  Sunday November 12 2023 03:54:21 EDT Ventricular Rate:  59 PR  Interval:  127 QRS Duration:  82 QT Interval:  402 QTC Calculation: 399 R Axis:   51  Text Interpretation: Sinus rhythm Low voltage, precordial leads No significant change since last tracing Confirmed by Midge Golas ([id number]) on 11/12/2023 4:07:12 AM  Radiology: DG Chest Portable 1 View Result Date: 11/12/2023 EXAM: 1 VIEW XRAY OF THE CHEST 11/12/2023 04:14:00 AM COMPARISON: 1 view chest x-ray 11/08/2023. CLINICAL HISTORY: CP FINDINGS: LUNGS AND PLEURA: Chronic changes of COPD are present. Aeration at the lung bases has improved. No focal pulmonary opacity. No pulmonary edema. No pleural effusion. No pneumothorax. HEART AND MEDIASTINUM: No acute abnormality of the cardiac and mediastinal silhouettes. BONES AND SOFT  TISSUES: No acute osseous abnormality. IMPRESSION: 1. No acute process. 2. Chronic changes of COPD. Aeration at the lung bases has improved. Electronically signed by: Lonni Necessary MD 11/12/2023 04:35 AM EDT RP Workstation: [id number]     Procedures   Medications Ordered in the ED  albuterol  (VENTOLIN  HFA) 108 (90 Base) MCG/ACT inhaler 1-2 puff (has no administration in time range)  levothyroxine  (SYNTHROID ) tablet 75 mcg (has no administration in time range)  aspirin  EC tablet 81 mg (has no administration in time range)  nitroGLYCERIN  (NITROSTAT ) SL tablet 0.4 mg (has no administration in time range)  rosuvastatin  (CRESTOR ) tablet 10 mg (has no administration in time range)  verapamil  (CALAN -SR) CR tablet 240 mg (has no administration in time range)  budesonide -glycopyrrolate -formoterol  (BREZTRI ) 160-9-4.8 MCG/ACT inhaler 2 puff (has no administration in time range)  montelukast  (SINGULAIR ) tablet 10 mg (has no administration in time range)  enoxaparin  (LOVENOX ) injection 40 mg (has no administration in time range)  sodium chloride  flush (NS) 0.9 % injection 3 mL (has no administration in time range)  sodium chloride  flush (NS) 0.9 % injection 3 mL (has no  administration in time range)  sodium chloride  flush (NS) 0.9 % injection 3 mL (has no administration in time range)  0.9 %  sodium chloride  infusion (has no administration in time range)  acetaminophen  (TYLENOL ) tablet 650 mg (has no administration in time range)    Or  acetaminophen  (TYLENOL ) suppository 650 mg (has no administration in time range)  ondansetron  (ZOFRAN ) tablet 4 mg (has no administration in time range)    Or  ondansetron  (ZOFRAN ) injection 4 mg (has no administration in time range)  fentaNYL  (SUBLIMAZE ) injection 50 mcg (50 mcg Intravenous Given 11/12/23 0438)    Clinical Course as of 11/12/23 0548  Sun Nov 12, 2023  0432 Patient presents for second ER visit this month for chest pain. Patient does have history of CAD but never required intervention Workup pending at this time but she will need to be admitted She declines nitroglycerin , will try fentanyl  for pain [DW]  0547 Patient is chest pain free at this time. However given her underlying risk factors and history, she will be admitted Initial troponin is negative History and exam not consistent with PE or dissection [DW]  [phone] Discussed with Dr. Sundil for admission [DW]    Clinical Course User Index [DW] Midge Golas, MD                                 Medical Decision Making Amount and/or Complexity of Data Reviewed Labs: ordered. Radiology: ordered.  Risk OTC drugs. Prescription drug management. Decision regarding hospitalization.   This patient presents to the ED for concern of chest pain, this involves an extensive number of treatment options, and is a complaint that carries with it a high risk of complications and morbidity.  The differential diagnosis includes but is not limited to acute coronary syndrome, aortic dissection, pulmonary embolism, pericarditis, pneumothorax, pneumonia, myocarditis, pleurisy, esophageal rupture    Comorbidities that complicate the patient evaluation: Patient's  presentation is complicated by their history of hypertension, CAD  Social Determinants of Health: Patient's previous tobacco use  increases the complexity of managing their presentation  Additional history obtained: Records reviewed previous cath report reviewed  Lab Tests: I Ordered, and personally interpreted labs.  The pertinent results include: Mild anemia  Imaging Studies ordered: I ordered imaging studies including X-ray chest  I independently  visualized and interpreted imaging which showed no acute findings I agree with the radiologist interpretation  Cardiac Monitoring: The patient was maintained on a cardiac monitor.  I personally viewed and interpreted the cardiac monitor which showed an underlying rhythm of:  sinus rhythm  Medicines ordered and prescription drug management: I ordered medication including fentanyl  for pain Reevaluation of the patient after these medicines showed that the patient    improved  Critical Interventions:   admission for chest pain evaluation  Consultations Obtained: I requested consultation with the admitting physician [facility], and discussed  findings as well as pertinent plan - they recommend: Admit  Reevaluation: After the interventions noted above, I reevaluated the patient and found that they have :improved  Complexity of problems addressed: Patient's presentation is most consistent with  acute presentation with potential threat to life or bodily function  Disposition: After consideration of the diagnostic results and the patient's response to treatment,  I feel that the patent would benefit from admission  .        Final diagnoses:  Chest pain, rule out acute myocardial infarction    ED Discharge Orders     None          Midge Golas, MD 11/12/23 [phone]

## 2023-11-12 NOTE — Plan of Care (Signed)

## 2023-11-12 NOTE — Progress Notes (Signed)
 PROGRESS NOTE    Brandy Townsend  FMW:[id number] DOB: 1950-12-19 DOA: 11/12/2023 PCP: Nichole Senior, MD   Brief Narrative: [place] COHICK is a 73 y.o. female with a history of hypertension, hyperlipidemia, CAD, hypothyroidism, rheumatoid arthritis, mixed connective tissue disorder, asthma, diverticulosis.  Patient presented secondary to substernal chest tightness with concern for possible cardiac etiology. Cardiology consulted.   Assessment/Plan:  Chest pain Concern for possible unstable angina. Multivessel CAD history. EKG normal sinus rhythm without ST-T wave changes. Troponin normal at 3 with negative delta of 3. Chest pain not improved with sublingual nitroglycerin . Possible pain could be related to costochondritis as well. Cardiology consulted. -Cardiology recommendations: plan for cardiac catheterization -Follow-up Transthoracic Echocardiogram -Check d-dimer; if elevated, may consider CTA chest, will need to be mindful since there is a plan for cardiac cath  Primary hypertension -Continue Verapamil   Asthma Noted. No wheezing. -Continue Breztri   Hypothyroidism -Continue Synthroid   Mixed connective tissue disorder Rheumatoid arthritis Patient follows with rheumatology and is on Orencia  as an outpatient.   DVT prophylaxis: Lovenox  Code Status:   Code Status: Full Code Family Communication: None at bedside Disposition Plan: Discharge home likely in 24 hours pending continued cardiac workup and cardiology recommendations.   Consultants:  Cardiology  Procedures:  None  Antimicrobials: None    Subjective: Patient reports ongoing chest tightness.  Objective: BP 127/74   Pulse 61   Temp 98.1 F (36.7 C) (Temporal)   Resp 15   Ht 5' 3 (1.6 m)   Wt 67.5 kg   SpO2 94%   BMI 26.36 kg/m   Examination:  General exam: Appears calm and comfortable Respiratory system: Clear to auscultation. Respiratory effort normal. Cardiovascular system: S1 & S2 heard, RRR.  No murmurs, rubs, gallops or clicks. Gastrointestinal system: Abdomen is nondistended, soft and nontender. Normal bowel sounds heard. Central nervous system: Alert and oriented. No focal neurological deficits. Musculoskeletal: No edema. No calf tenderness Skin: No cyanosis. No rashes Psychiatry: Judgement and insight appear normal. Mood & affect appropriate.    Data Reviewed: I have personally reviewed following labs and imaging studies   Last CBC Lab Results  Component Value Date   WBC 8.8 11/12/2023   HGB 11.5 (L) 11/12/2023   HCT 35.7 (L) 11/12/2023   MCV 91.3 11/12/2023   MCH 29.4 11/12/2023   RDW 13.5 11/12/2023   PLT 269 11/12/2023     Last metabolic panel Lab Results  Component Value Date   GLUCOSE 114 (H) 11/12/2023   NA 142 11/12/2023   K 3.8 11/12/2023   CL 112 (H) 11/12/2023   CO2 21 (L) 11/12/2023   BUN 24 (H) 11/12/2023   CREATININE 0.91 11/12/2023   GFRNONAA >60 11/12/2023   CALCIUM  8.8 (L) 11/12/2023   PROT 5.8 (L) 02/28/2023   ALBUMIN 3.6 02/28/2023   BILITOT 0.4 02/28/2023   ALKPHOS 71 02/28/2023   AST 11 (L) 02/28/2023   ALT 13 02/28/2023   ANIONGAP 9 11/12/2023     Creatinine Clearance: Estimated Creatinine Clearance: 52.3 mL/min (by C-G formula based on SCr of 0.91 mg/dL).  No results found for this or any previous visit (from the past 240 hours).    Radiology Studies: DG Chest Portable 1 View Result Date: 11/12/2023 EXAM: 1 VIEW XRAY OF THE CHEST 11/12/2023 04:14:00 AM COMPARISON: 1 view chest x-ray 11/08/2023. CLINICAL HISTORY: CP FINDINGS: LUNGS AND PLEURA: Chronic changes of COPD are present. Aeration at the lung bases has improved. No focal pulmonary opacity. No pulmonary edema. No pleural  effusion. No pneumothorax. HEART AND MEDIASTINUM: No acute abnormality of the cardiac and mediastinal silhouettes. BONES AND SOFT TISSUES: No acute osseous abnormality. IMPRESSION: 1. No acute process. 2. Chronic changes of COPD. Aeration at the lung  bases has improved. Electronically signed by: Lonni Necessary MD 11/12/2023 04:35 AM EDT RP Workstation: [id number]      LOS: 0 days    Elgin Lam, MD [facility] 11/12/2023, 7:33 AM   If 7PM-7AM, please contact night-coverage [email or web address]

## 2023-11-13 ENCOUNTER — Encounter (HOSPITAL_COMMUNITY)
Admission: EM | Disposition: A | Discharge status: Home / Self Care | Payer: Self-pay | Source: Home / Self Care | Attending: Emergency Medicine

## 2023-11-13 ENCOUNTER — Observation Stay (HOSPITAL_BASED_OUTPATIENT_CLINIC_OR_DEPARTMENT_OTHER)

## 2023-11-13 ENCOUNTER — Encounter (HOSPITAL_COMMUNITY): Payer: Self-pay | Admitting: Internal Medicine

## 2023-11-13 DIAGNOSIS — I200 Unstable angina: Secondary | ICD-10-CM | POA: Diagnosis not present

## 2023-11-13 DIAGNOSIS — I25110 Atherosclerotic heart disease of native coronary artery with unstable angina pectoris: Principal | ICD-10-CM

## 2023-11-13 DIAGNOSIS — R079 Chest pain, unspecified: Secondary | ICD-10-CM

## 2023-11-13 HISTORY — PX: LEFT HEART CATH AND CORONARY ANGIOGRAPHY: CATH118249

## 2023-11-13 LAB — ECHOCARDIOGRAM COMPLETE
Area-P 1/2: 3.85 cm2
Height: 63 in
S' Lateral: 2.8 cm
Weight: 2380.97 [oz_av]

## 2023-11-13 LAB — COMPREHENSIVE METABOLIC PANEL WITH GFR
ALT: 15 U/L (ref 0–44)
AST: 16 U/L (ref 15–41)
Albumin: 3.1 g/dL — ABNORMAL LOW (ref 3.5–5.0)
Alkaline Phosphatase: 74 U/L (ref 38–126)
Anion gap: 6 (ref 5–15)
BUN: 12 mg/dL (ref 8–23)
CO2: 24 mmol/L (ref 22–32)
Calcium: 9 mg/dL (ref 8.9–10.3)
Chloride: 111 mmol/L (ref 98–111)
Creatinine, Ser: 0.76 mg/dL (ref 0.44–1.00)
GFR, Estimated: >60 mL/min (ref >60)
Glucose, Bld: 101 mg/dL — ABNORMAL HIGH (ref 70–99)
Potassium: 4.2 mmol/L (ref 3.5–5.1)
Sodium: 141 mmol/L (ref 135–145)
Total Bilirubin: 0.5 mg/dL (ref 0.0–1.2)
Total Protein: 5.9 g/dL — ABNORMAL LOW (ref 6.5–8.1)

## 2023-11-13 LAB — TSH: TSH: 1.022 u[IU]/mL (ref 0.350–4.500)

## 2023-11-13 LAB — CBC
HCT: 37.2 % (ref 36.0–46.0)
Hemoglobin: 11.9 g/dL — ABNORMAL LOW (ref 12.0–15.0)
MCH: 29.2 pg (ref 26.0–34.0)
MCHC: 32 g/dL (ref 30.0–36.0)
MCV: 91.4 fL (ref 80.0–100.0)
Platelets: 285 K/uL (ref 150–400)
RBC: 4.07 MIL/uL (ref 3.87–5.11)
RDW: 13.5 % (ref 11.5–15.5)
WBC: 8 K/uL (ref 4.0–10.5)
nRBC: 0 % (ref 0.0–0.2)

## 2023-11-13 LAB — LIPID PANEL
Cholesterol: 121 mg/dL (ref 0–200)
HDL: 65 mg/dL (ref >40)
LDL Cholesterol: 47 mg/dL (ref 0–99)
Total CHOL/HDL Ratio: 1.9 ratio
Triglycerides: 43 mg/dL (ref <150)
VLDL: 9 mg/dL (ref 0–40)

## 2023-11-13 SURGERY — LEFT HEART CATH AND CORONARY ANGIOGRAPHY
Anesthesia: LOCAL

## 2023-11-13 MED ORDER — LIDOCAINE HCL (PF) 1 % IJ SOLN
INTRAMUSCULAR | Status: DC | PRN
  Administered 2023-11-13: 2 mL via INTRADERMAL

## 2023-11-13 MED ORDER — HEPARIN (PORCINE) IN NACL 1000-0.9 UT/500ML-% IV SOLN
INTRAVENOUS | Status: DC | PRN
  Administered 2023-11-13: 1000 mL

## 2023-11-13 MED ORDER — VERAPAMIL HCL 2.5 MG/ML IV SOLN
INTRAVENOUS | Status: DC | PRN
  Administered 2023-11-13: 10 mL via INTRA_ARTERIAL

## 2023-11-13 MED ORDER — VERAPAMIL HCL 2.5 MG/ML IV SOLN
INTRAVENOUS | Status: AC
  Filled 2023-11-13: qty 2

## 2023-11-13 MED ORDER — LABETALOL HCL 5 MG/ML IV SOLN
10.0000 mg | INTRAVENOUS | Status: AC | PRN
Start: 2023-11-13 — End: 2023-11-13

## 2023-11-13 MED ORDER — HEPARIN SODIUM (PORCINE) 1000 UNIT/ML IJ SOLN
INTRAMUSCULAR | Status: DC | PRN
  Administered 2023-11-13: 3500 [IU] via INTRAVENOUS

## 2023-11-13 MED ORDER — SODIUM CHLORIDE 0.9% FLUSH: 3.0000 mL | INTRAVENOUS | Status: DC | PRN

## 2023-11-13 MED ORDER — SODIUM CHLORIDE 0.9 % IV SOLN: 250.0000 mL | INTRAVENOUS | Status: DC | PRN

## 2023-11-13 MED ORDER — HEPARIN SODIUM (PORCINE) 1000 UNIT/ML IJ SOLN
INTRAMUSCULAR | Status: AC
  Filled 2023-11-13: qty 10

## 2023-11-13 MED ORDER — FREE WATER
500.0000 mL | Freq: Once | Status: AC
  Administered 2023-11-13: 500 mL via ORAL

## 2023-11-13 MED ORDER — SODIUM CHLORIDE 0.9% FLUSH
3.0000 mL | Freq: Two times a day (BID) | INTRAVENOUS | Status: DC
  Administered 2023-11-13: 3 mL via INTRAVENOUS

## 2023-11-13 MED ORDER — HYDRALAZINE HCL 20 MG/ML IJ SOLN: 10.0000 mg | INTRAMUSCULAR | Status: AC | PRN

## 2023-11-13 MED ORDER — FENTANYL CITRATE (PF) 100 MCG/2ML IJ SOLN
INTRAMUSCULAR | Status: AC
  Filled 2023-11-13: qty 2

## 2023-11-13 MED ORDER — MIDAZOLAM HCL 2 MG/2ML IJ SOLN
INTRAMUSCULAR | Status: DC | PRN
  Administered 2023-11-13 (×2): 1 mg via INTRAVENOUS

## 2023-11-13 MED ORDER — FENTANYL CITRATE (PF) 100 MCG/2ML IJ SOLN
INTRAMUSCULAR | Status: DC | PRN
  Administered 2023-11-13 (×2): 25 ug via INTRAVENOUS

## 2023-11-13 MED ORDER — IOHEXOL 350 MG/ML SOLN
INTRAVENOUS | Status: DC | PRN
Start: 2023-11-13 — End: 2023-11-13
  Administered 2023-11-13: 30 mL

## 2023-11-13 MED ORDER — MIDAZOLAM HCL 2 MG/2ML IJ SOLN
INTRAMUSCULAR | Status: AC
  Filled 2023-11-13: qty 2

## 2023-11-13 MED ORDER — ENOXAPARIN SODIUM 40 MG/0.4ML IJ SOSY: 40.0000 mg | PREFILLED_SYRINGE | INTRAMUSCULAR | Status: DC

## 2023-11-13 SURGICAL SUPPLY — 7 items
CATH 5FR JL3.5 JR4 ANG PIG MP (CATHETERS) IMPLANT
DEVICE RAD COMP TR BAND LRG (VASCULAR PRODUCTS) IMPLANT
GLIDESHEATH SLEND SS 6F .021 (SHEATH) IMPLANT
GUIDEWIRE INQWIRE 1.5J.035X260 (WIRE) IMPLANT
KIT SINGLE USE MANIFOLD (KITS) IMPLANT
PACK CARDIAC CATHETERIZATION (CUSTOM PROCEDURE TRAY) ×1 IMPLANT
SET ATX-X65L (MISCELLANEOUS) IMPLANT

## 2023-11-13 NOTE — Plan of Care (Signed)
  Problem: Education: Goal: Knowledge of General Education information will improve Description: Including pain rating scale, medication(s)/side effects and non-pharmacologic comfort measures Outcome: Adequate for Discharge   Problem: Health Behavior/Discharge Planning: Goal: Ability to manage health-related needs will improve Outcome: Adequate for Discharge   Problem: Clinical Measurements: Goal: Ability to maintain clinical measurements within normal limits will improve 11/13/2023 1455 by Dallie Waddell SAUNDERS, RN Outcome: Adequate for Discharge 11/13/2023 1446 by Dallie Waddell SAUNDERS, RN Outcome: Progressing Goal: Will remain free from infection 11/13/2023 1455 by Dallie Waddell SAUNDERS, RN Outcome: Adequate for Discharge 11/13/2023 1446 by Dallie Waddell SAUNDERS, RN Outcome: Progressing Goal: Diagnostic test results will improve 11/13/2023 1455 by Dallie Waddell SAUNDERS, RN Outcome: Adequate for Discharge 11/13/2023 1446 by Dallie Waddell SAUNDERS, RN Outcome: Progressing Goal: Respiratory complications will improve 11/13/2023 1455 by Dallie Waddell SAUNDERS, RN Outcome: Adequate for Discharge 11/13/2023 1446 by Dallie Waddell SAUNDERS, RN Outcome: Progressing Goal: Cardiovascular complication will be avoided 11/13/2023 1455 by Dallie Waddell SAUNDERS, RN Outcome: Adequate for Discharge 11/13/2023 1446 by Dallie Waddell SAUNDERS, RN Outcome: Progressing   Problem: Activity: Goal: Risk for activity intolerance will decrease 11/13/2023 1455 by Dallie Waddell SAUNDERS, RN Outcome: Adequate for Discharge 11/13/2023 1446 by Dallie Waddell SAUNDERS, RN Outcome: Progressing   Problem: Nutrition: Goal: Adequate nutrition will be maintained Outcome: Adequate for Discharge   Problem: Coping: Goal: Level of anxiety will decrease 11/13/2023 1455 by Dallie Waddell SAUNDERS, RN Outcome: Adequate for Discharge 11/13/2023 1446 by Dallie Waddell SAUNDERS, RN Outcome: Progressing   Problem: Elimination: Goal: Will not experience complications related to bowel motility 11/13/2023 1455 by  Dallie Waddell SAUNDERS, RN Outcome: Adequate for Discharge 11/13/2023 1446 by Dallie Waddell SAUNDERS, RN Outcome: Progressing Goal: Will not experience complications related to urinary retention 11/13/2023 1455 by Dallie Waddell SAUNDERS, RN Outcome: Adequate for Discharge 11/13/2023 1446 by Dallie Waddell SAUNDERS, RN Outcome: Progressing   Problem: Pain Managment: Goal: General experience of comfort will improve and/or be controlled 11/13/2023 1455 by Dallie Waddell SAUNDERS, RN Outcome: Adequate for Discharge 11/13/2023 1446 by Dallie Waddell SAUNDERS, RN Outcome: Progressing   Problem: Safety: Goal: Ability to remain free from injury will improve 11/13/2023 1455 by Dallie Waddell SAUNDERS, RN Outcome: Adequate for Discharge 11/13/2023 1446 by Dallie Waddell SAUNDERS, RN Outcome: Progressing   Problem: Skin Integrity: Goal: Risk for impaired skin integrity will decrease 11/13/2023 1455 by Dallie Waddell SAUNDERS, RN Outcome: Adequate for Discharge 11/13/2023 1446 by Dallie Waddell SAUNDERS, RN Outcome: Progressing   Problem: Education: Goal: Understanding of CV disease, CV risk reduction, and recovery process will improve Outcome: Adequate for Discharge Goal: Individualized Educational Video(s) Outcome: Adequate for Discharge   Problem: Activity: Goal: Ability to return to baseline activity level will improve 11/13/2023 1455 by Dallie Waddell SAUNDERS, RN Outcome: Adequate for Discharge 11/13/2023 1446 by Dallie Waddell SAUNDERS, RN Outcome: Progressing   Problem: Cardiovascular: Goal: Ability to achieve and maintain adequate cardiovascular perfusion will improve Outcome: Adequate for Discharge Goal: Vascular access site(s) Level 0-1 will be maintained 11/13/2023 1455 by Dallie Waddell SAUNDERS, RN Outcome: Adequate for Discharge 11/13/2023 1446 by Dallie Waddell SAUNDERS, RN Outcome: Progressing   Problem: Health Behavior/Discharge Planning: Goal: Ability to safely manage health-related needs after discharge will improve Outcome: Adequate for Discharge

## 2023-11-13 NOTE — Interval H&P Note (Signed)
 History and Physical Interval Note:  11/13/2023 7:37 AM  Brandy Townsend  has presented today for surgery, with the diagnosis of unstable angina.  The various methods of treatment have been discussed with the patient and family. After consideration of risks, benefits and other options for treatment, the patient has consented to  Procedure(s): LEFT HEART CATH AND CORONARY ANGIOGRAPHY (N/A) as a surgical intervention.  The patient's history has been reviewed, patient examined, no change in status, stable for surgery.  I have reviewed the patient's chart and labs.  Questions were answered to the patient's satisfaction.    Cath Lab Visit (complete for each Cath Lab visit)  Clinical Evaluation Leading to the Procedure:   ACS: Yes.  (Unstable angina)  Non-ACS:  N/A  [name]

## 2023-11-13 NOTE — TOC Transition Note (Signed)
 Transition of Care [facility]) - Discharge Note   Patient Details  Name: Brandy Townsend MRN: [id number] Date of Birth: Jul 02, 1950  [facility] CM/SW Contact:  Andrez JULIANNA George, RN Phone Number: 11/13/2023, 3:33 PM   Clinical Narrative:     Pt is discharging home with self care. No needs per IP Care management.   Final next level of care: Home/Self Care Barriers to Discharge: No Barriers Identified   Patient Goals and CMS Choice            Discharge Placement                       Discharge Plan and Services Additional resources added to the After Visit Summary for                                       Social Drivers of Health (SDOH) Interventions SDOH Screenings   Food Insecurity: No Food Insecurity (11/12/2023)  Housing: Low Risk  (11/12/2023)  Transportation Needs: No Transportation Needs (11/12/2023)  Utilities: Not At Risk (11/12/2023)  Social Connections: Moderately Isolated (11/12/2023)  Tobacco Use: Medium Risk (11/12/2023)     Readmission Risk Interventions     No data to display

## 2023-11-13 NOTE — Progress Notes (Signed)
  Echocardiogram 2D Echocardiogram has been performed.  Koleen KANDICE Popper, RDCS 11/13/2023, 3:27 PM

## 2023-11-13 NOTE — Discharge Summary (Signed)
 Physician Discharge Summary   Patient: Brandy Townsend MRN: [id number] DOB: 03-26-1951  Admit date:     11/12/2023  Discharge date: 11/13/23  Discharge Physician: Elgin Lam, MD   PCP: Nichole Senior, MD   Recommendations at discharge:  PCP visit for hospital follow-up Cardiology visit for hospital follow-up  Discharge Diagnoses: Principal Problem:   Unstable angina [facility]) Active Problems:   Essential hypertension   Rheumatoid arthritis (HCC)   Anxiety   Asthma, chronic   History of CAD (coronary artery disease)   Connective tissue disorder (HCC)   Hypothyroidism  Resolved Problems:   * No resolved hospital problems. *  Hospital Course: Brandy Townsend is a 73 y.o. female with a history of hypertension, hyperlipidemia, CAD, hypothyroidism, rheumatoid arthritis, mixed connective tissue disorder, asthma, diverticulosis.  Patient presented secondary to substernal chest tightness with concern for possible cardiac etiology. Cardiology consulted and performed cardiac catheterization on 8/18 revealing mild-moderate multivessel CAD with recommendations for continued medical management. Transthoracic Echocardiogram obtained and significant for LVEF of 60-65% with no regional wall motion abnormalities.  Assessment and Plan:  Chest pain Concern for possible unstable angina. Multivessel CAD history. EKG normal sinus rhythm without ST-T wave changes. Troponin normal at 3 with negative delta of 3. Chest pain not improved with sublingual nitroglycerin . Possible pain could be related to costochondritis as well. Cardiology consulted and performed a left heart catheterization on 8/18 revealing mild-moderate multivessel CAD. Cardiology recommendations to continue aspirin  and Crestor  on discharge. Transthoracic Echocardiogram with normal LVEF and no regional wall motion abnormality.   Primary hypertension Continue Verapamil    Asthma Noted. No wheezing. Continue Breztri    Hypothyroidism Continue  Synthroid    Mixed connective tissue disorder Rheumatoid arthritis Patient follows with rheumatology and is on Orencia  as an outpatient.   Consultants:  Cardiology   Procedures:  Left heart catheterization Transthoracic Echocardiogram  Disposition: Home Diet recommendation: Cardiac diet   DISCHARGE MEDICATION: Allergies as of 11/13/2023       Reactions   Cat Dander    Other reaction(s): congestion   Dust Mite Extract    Other reaction(s): Unknown   Morphine  And Codeine Nausea And Vomiting   Macrobid [nitrofurantoin Monohyd Macro] Other (See Comments)   Lowered potassium, caused aches and pains        Medication List     TAKE these medications    albuterol  108 (90 Base) MCG/ACT inhaler Commonly known as: VENTOLIN  HFA Inhale 1-2 puffs into the lungs every 6 (six) hours as needed for wheezing or shortness of breath.   aspirin  EC 81 MG tablet Take 1 tablet (81 mg total) by mouth daily. Swallow whole.   Breztri  Aerosphere 160-9-4.8 MCG/ACT Aero inhaler Generic drug: budesonide -glycopyrrolate -formoterol  Inhale 2 puffs into the lungs in the morning and at bedtime.   cetirizine 10 MG tablet Commonly known as: ZYRTEC Take 10 mg by mouth daily.   diclofenac  Sodium 1 % Gel Commonly known as: Voltaren  Apply 4 g topically 4 (four) times daily. What changed:  when to take this reasons to take this   EQ MULTIVITAMINS ADULT GUMMY PO Take 1 each by mouth daily.   levothyroxine  75 MCG tablet Commonly known as: SYNTHROID  Take 1 tablet (75 mcg total) by mouth every morning on an empty stomach   montelukast  10 MG tablet Commonly known as: SINGULAIR  Take 1 tablet (10 mg total) by mouth daily at night   nitroGLYCERIN  0.4 MG SL tablet Commonly known as: NITROSTAT  Place 1 tablet (0.4 mg total) under the tongue every  5 (five) minutes as needed for chest pain.   Orencia  250 MG injection Generic drug: abatacept  Inject 250 mg into the vein See admin instructions. Inject  250 mg IV once every month.   potassium chloride  10 MEQ tablet Commonly known as: KLOR-CON  Take 1 tablet (10 mEq total) by mouth daily.   QC TUMERIC COMPLEX PO Take 2 tablets by mouth daily.   rosuvastatin  10 MG tablet Commonly known as: CRESTOR  Take 1 tablet (10 mg total) by mouth daily.   torsemide  20 MG tablet Commonly known as: DEMADEX  Take 1 tablet (20 mg total) by mouth in the morning AND ONE-HALF tablet (10 mg total) every evening.   verapamil  120 MG CR tablet Commonly known as: CALAN -SR TAKE 1 TABLET (120 MG TOTAL) BY MOUTH DAILY AS NEEDED.   Vitamin D (Ergocalciferol) 1.25 MG (50000 UNIT) Caps capsule Commonly known as: DRISDOL Take 50,000 Units by mouth every 7 (seven) days. Monday        Discharge Exam: BP 106/65 (BP Location: Left Arm)   Pulse 66   Temp 98.2 F (36.8 C) (Oral)   Resp 19   Ht 5' 3 (1.6 m)   Wt 67.5 kg   SpO2 99%   BMI 26.36 kg/m   General exam: Appears calm and comfortable Respiratory system: Clear to auscultation. Respiratory effort normal. Cardiovascular system: S1 & S2 heard, RRR. No murmurs. Gastrointestinal system: Abdomen is nondistended, soft and nontender. Normal bowel sounds heard. Central nervous system: Alert and oriented. No focal neurological deficits. Psychiatry: Judgement and insight appear normal. Mood & affect appropriate.   Condition at discharge: stable  The results of significant diagnostics from this hospitalization (including imaging, microbiology, ancillary and laboratory) are listed below for reference.   Imaging Studies: CARDIAC CATHETERIZATION Result Date: 11/13/2023 Conclusions: Mild-moderate multivessel coronary artery disease, as detailed below, similar to last catheterization in 2021.  DFR of 50-60% RCA stenosis at that time was normal. Normal left ventricular systolic function (LVEF 55-65%) with normal filling pressure (LVEDP 15 mmHg). Recommendations: Continue medical therapy and risk factor  modification to prevent progression of coronary artery disease. Follow-up echocardiogram. Lonni Hanson, MD [facility]  DG Chest Portable 1 View Result Date: 11/12/2023 EXAM: 1 VIEW XRAY OF THE CHEST 11/12/2023 04:14:00 AM COMPARISON: 1 view chest x-ray 11/08/2023. CLINICAL HISTORY: CP FINDINGS: LUNGS AND PLEURA: Chronic changes of COPD are present. Aeration at the lung bases has improved. No focal pulmonary opacity. No pulmonary edema. No pleural effusion. No pneumothorax. HEART AND MEDIASTINUM: No acute abnormality of the cardiac and mediastinal silhouettes. BONES AND SOFT TISSUES: No acute osseous abnormality. IMPRESSION: 1. No acute process. 2. Chronic changes of COPD. Aeration at the lung bases has improved. Electronically signed by: Lonni Necessary MD 11/12/2023 04:35 AM EDT RP Workstation: [id number]   DG Chest Port 1 View Result Date: 11/08/2023 EXAM: 1 VIEW XRAY OF THE CHEST 11/08/2023 05:00:00 AM COMPARISON: 06/26/2023. CLINICAL HISTORY: Chest pain. FINDINGS: LUNGS AND PLEURA: Focal opacity is noted within the periphery of the left lung base. No pulmonary edema. No pleural effusion. No pneumothorax. HEART AND MEDIASTINUM: No acute abnormality of the cardiac and mediastinal silhouettes. BONES AND SOFT TISSUES: No acute osseous abnormality. IMPRESSION: 1. Focal opacity within the periphery of the left lung base. This may represent a small area of atelectasis or early infiltrate. 2. No pleural effusion or pneumothorax. Electronically signed by: Waddell Calk MD 11/08/2023 05:31 AM EDT RP Workstation: [id number]    Microbiology: Results for orders placed or performed during the hospital  encounter of 03/19/22  Resp panel by RT-PCR (RSV, Flu A&B, Covid) Anterior Nasal Swab     Status: Abnormal   Collection Time: 03/19/22  7:35 AM   Specimen: Anterior Nasal Swab  Result Value Ref Range Status   SARS Coronavirus 2 by RT PCR NEGATIVE NEGATIVE Final    Comment: (NOTE) SARS-CoV-2 target  nucleic acids are NOT DETECTED.  The SARS-CoV-2 RNA is generally detectable in upper respiratory specimens during the acute phase of infection. The lowest concentration of SARS-CoV-2 viral copies this assay can detect is 138 copies/mL. A negative result does not preclude SARS-Cov-2 infection and should not be used as the sole basis for treatment or other patient management decisions. A negative result may occur with  improper specimen collection/handling, submission of specimen other than nasopharyngeal swab, presence of viral mutation(s) within the areas targeted by this assay, and inadequate number of viral copies(<138 copies/mL). A negative result must be combined with clinical observations, patient history, and epidemiological information. The expected result is Negative.  Fact Sheet for Patients:  BloggerCourse.com  Fact Sheet for Healthcare Providers:  SeriousBroker.it  This test is no t yet approved or cleared by the [place]  FDA and  has been authorized for detection and/or diagnosis of SARS-CoV-2 by FDA under an Emergency Use Authorization (EUA). This EUA will remain  in effect (meaning this test can be used) for the duration of the COVID-19 declaration under Section 564(b)(1) of the Act, 21 U.S.C.section 360bbb-3(b)(1), unless the authorization is terminated  or revoked sooner.       Influenza A by PCR POSITIVE (A) NEGATIVE Final   Influenza B by PCR NEGATIVE NEGATIVE Final    Comment: (NOTE) The Xpert Xpress SARS-CoV-2/FLU/RSV plus assay is intended as an aid in the diagnosis of influenza from Nasopharyngeal swab specimens and should not be used as a sole basis for treatment. Nasal washings and aspirates are unacceptable for Xpert Xpress SARS-CoV-2/FLU/RSV testing.  Fact Sheet for Patients: BloggerCourse.com  Fact Sheet for Healthcare  Providers: SeriousBroker.it  This test is not yet approved or cleared by the [place]  FDA and has been authorized for detection and/or diagnosis of SARS-CoV-2 by FDA under an Emergency Use Authorization (EUA). This EUA will remain in effect (meaning this test can be used) for the duration of the COVID-19 declaration under Section 564(b)(1) of the Act, 21 U.S.C. section 360bbb-3(b)(1), unless the authorization is terminated or revoked.     Resp Syncytial Virus by PCR NEGATIVE NEGATIVE Final    Comment: (NOTE) Fact Sheet for Patients: BloggerCourse.com  Fact Sheet for Healthcare Providers: SeriousBroker.it  This test is not yet approved or cleared by the [place]  FDA and has been authorized for detection and/or diagnosis of SARS-CoV-2 by FDA under an Emergency Use Authorization (EUA). This EUA will remain in effect (meaning this test can be used) for the duration of the COVID-19 declaration under Section 564(b)(1) of the Act, 21 U.S.C. section 360bbb-3(b)(1), unless the authorization is terminated or revoked.  Performed at [facility], [address], [place], [place] [address]     Labs: CBC: Recent Labs  Lab 11/08/23 0448 11/12/23 0427 11/13/23 0704  WBC 8.6 8.8 8.0  NEUTROABS 4.6  --   --   HGB 12.4 11.5* 11.9*  HCT 38.4 35.7* 37.2  MCV 90.4 91.3 91.4  PLT 322 269 285   Basic Metabolic Panel: Recent Labs  Lab 11/08/23 0448 11/12/23 0427 11/13/23 0704  NA 140 142 141  K 3.7 3.8 4.2  CL  107 112* 111  CO2 24 21* 24  GLUCOSE 124* 114* 101*  BUN 18 24* 12  CREATININE 0.77 0.91 0.76  CALCIUM  9.2 8.8* 9.0   Liver Function Tests: Recent Labs  Lab 11/13/23 0704  AST 16  ALT 15  ALKPHOS 74  BILITOT 0.5  PROT 5.9*  ALBUMIN 3.1*    Discharge time spent: 35 minutes.  Signed: Elgin Lam, MD [facility] 11/13/2023

## 2023-11-13 NOTE — Hospital Course (Addendum)
 Brandy Townsend is a 73 y.o. female with a history of hypertension, hyperlipidemia, CAD, hypothyroidism, rheumatoid arthritis, mixed connective tissue disorder, asthma, diverticulosis.  Patient presented secondary to substernal chest tightness with concern for possible cardiac etiology. Cardiology consulted and performed cardiac catheterization on 8/18 revealing mild-moderate multivessel CAD with recommendations for continued medical management. Transthoracic Echocardiogram obtained and significant for LVEF of 60-65% with no regional wall motion abnormalities.

## 2023-11-13 NOTE — Care Management Obs Status (Signed)
 MEDICARE OBSERVATION STATUS NOTIFICATION   Patient Details  Name: SACHIKO METHOT MRN: [id number] Date of Birth: [birth year]   Medicare Observation Status Notification Given:  Yes    Andrez JULIANNA George, RN 11/13/2023, 3:49 PM

## 2023-11-13 NOTE — Discharge Instructions (Signed)
 Brandy Townsend,  You were in the hospital with chest pain. Your cardiac workup does not suggest that your chest pain is related to your heart, thankfully. It is possible it could be stress related. Other possibilities are reflux disease and inflammation. We checked for a possible blood clot, but this also appears unlikely. Please follow-up with your PCP.

## 2023-11-13 NOTE — Plan of Care (Signed)
  Problem: Clinical Measurements: Goal: Ability to maintain clinical measurements within normal limits will improve Outcome: Progressing Goal: Will remain free from infection Outcome: Progressing Goal: Diagnostic test results will improve Outcome: Progressing Goal: Respiratory complications will improve Outcome: Progressing Goal: Cardiovascular complication will be avoided Outcome: Progressing   Problem: Activity: Goal: Risk for activity intolerance will decrease Outcome: Progressing   Problem: Coping: Goal: Level of anxiety will decrease Outcome: Progressing   Problem: Elimination: Goal: Will not experience complications related to bowel motility Outcome: Progressing Goal: Will not experience complications related to urinary retention Outcome: Progressing   Problem: Pain Managment: Goal: General experience of comfort will improve and/or be controlled Outcome: Progressing   Problem: Safety: Goal: Ability to remain free from injury will improve Outcome: Progressing   Problem: Skin Integrity: Goal: Risk for impaired skin integrity will decrease Outcome: Progressing   Problem: Activity: Goal: Ability to return to baseline activity level will improve Outcome: Progressing   Problem: Cardiovascular: Goal: Vascular access site(s) Level 0-1 will be maintained Outcome: Progressing

## 2023-11-27 NOTE — Progress Notes (Signed)
 " Cardiology Office Note    Patient Name: Brandy Townsend Date of Encounter: 11/27/2023  Primary Care Provider:  Nichole Senior, MD Primary Cardiologist:  Brandy Emmer, MD Primary Electrophysiologist: None   Past Medical History    Past Medical History:  Diagnosis Date   Asthma    CAD (coronary artery disease)    Diverticulosis    Graves disease    s/p RAI   High cholesterol    Hypertension    Hypothyroidism    Nephrolithiasis    Osteoporosis    Rheumatoid arthritis (HCC)    Vertigo     History of Present Illness   Brandy Townsend is a 73 y.o. female with a PMH of CAD recurrent atypical chest pain, palpitations, aortic atherosclerosis, HTN, hypothyroidism s/p thyroid  ablation, asthma, rheumatoid arthritis, former tobacco abuse who presents today for post cath follow-up.  Brandy Townsend was last seen on 07/25/2023 for annual follow-up.  During her visit she reported fatigue and ongoing gasping sensation.  Any angina or chest pain at that time.  She noted palpitations and was started on a trial of verapamil  120 mg.  She was also advised to increase hydration.  She presented to the ED via EMS on 11/08/2023 with complaint of chest pain.  She reported pain radiating to the neck with 9 out of 10 in severity.  She was treated in the field with nitro and morphine  as well as Zofran .  EKG was completed showing no acute changes and she troponins were negative x 2.  She presented back to the ED 4 days later on 11/12/2023 with complaint of chest discomfort that woke her out of her sleep.  She noted an intense sharp pain that felt like someone pressing on her chest.  She had relief of symptoms with nitroglycerin .  Due to severity of symptoms LHC was recommended revealing mild to moderate multivessel CAD similar to her last heart cath with recommendation to continue medical management.  2D echo was also completed showing EF of 60 to 65% with no RWMA.  She was continued on aspirin  and Crestor  at discharge with no  further changes made to therapy.   Brandy Townsend presents today for post left heart cath follow-up.  She reports since her previous cath procedure no complaints of chest pain. She experiences fatigue, which she describes as 'exhausting,' and notes that she has slept for extended periods, such as 11 hours on one occasion. She attributes some of her fatigue to stress and anxiety, mentioning personal stressors including her husband's health issues and family living abroad. She recently returned to work part-time, which she feels helps reduce her stress. She has a history of rheumatoid arthritis, with symptoms including hand pain. She missed a scheduled appointment with her rheumatologist due to her recent cardiac procedure and is scheduled to see her in October. She questions whether her RA could be contributing to her fatigue. Her asthma is reportedly well-controlled, and she is scheduled for a CT scan with her pulmonologist. No recent asthma flares are reported. She is on cholesterol medication, which has effectively managed her cholesterol levels, with an LDL of 47. Patient denies chest pain, palpitations, dyspnea, PND, orthopnea, nausea, vomiting, dizziness, syncope, edema, weight gain, or early satiety.   Discussed the use of AI scribe software for clinical note transcription with the patient, who gave verbal consent to proceed.  History of Present Illness   Review of Systems  Please see the history of present illness.    All other  systems reviewed and are otherwise negative except as noted above.  Physical Exam    Wt Readings from Last 3 Encounters:  11/12/23 148 lb 13 oz (67.5 kg)  11/08/23 149 lb (67.6 kg)  10/05/23 149 lb (67.6 kg)   CD:Uyzmz were no vitals filed for this visit.,There is no height or weight on file to calculate BMI. GEN: Well nourished, well developed in no acute distress Neck: No JVD; No carotid bruits Pulmonary: Clear to auscultation without rales, wheezing or rhonchi   Cardiovascular: Right radial site clean dry and intact no evidence of ecchymosis normal rate. Regular rhythm. Normal S1. Normal S2.   Murmurs: There is no murmur.  ABDOMEN: Soft, non-tender, non-distended EXTREMITIES:  No edema; No deformity   EKG/LABS/ Recent Cardiac Studies   ECG personally reviewed by me today -none completed today  Risk Assessment/Calculations:        Lab Results  Component Value Date   WBC 8.0 11/13/2023   HGB 11.9 (L) 11/13/2023   HCT 37.2 11/13/2023   MCV 91.4 11/13/2023   PLT 285 11/13/2023   Lab Results  Component Value Date   CREATININE 0.76 11/13/2023   BUN 12 11/13/2023   NA 141 11/13/2023   K 4.2 11/13/2023   CL 111 11/13/2023   CO2 24 11/13/2023   Lab Results  Component Value Date   CHOL 121 11/13/2023   HDL 65 11/13/2023   LDLCALC 47 11/13/2023   TRIG 43 11/13/2023   CHOLHDL 1.9 11/13/2023    No results found for: HGBA1C Assessment & Plan    Assessment & Plan  1.  History of CAD: -s/p LHC completed on 11/13/2023 mild to moderate multivessel CAD with normal DFR of 50-60% RCA stenosis that was normal - Today patient reports complaints of chest pain since LHC - Right radial site clean dry intact with no hematoma or ecchymosis - Continue current GDMT with ASA 81 mg and Crestor  10 mg - ED precautions encouraged if chest pain returns and is not relieved with as needed nitroglycerin   2.  Essential hypertension: - BP stable at 100/54 - Advised to change verapamil  to as needed monitor BP and heart rate  3.  Hyperlipidemia: -LDL at 47, below target for coronary disease patients. - Continue Crestor  10 mg daily   4.  Moderate persistent asthma: - Well-controlled with no new symptoms - Continue treatment plan per pulmonology  5.  Palpitations: -Palpitations improved with current management.  - Change verapamil  to as needed for palpitations due to low BP and fatigue  6. Fatigue: Persistent fatigue possibly due to stress, anxiety,  or multifactorial causes. Cardiac workup reassuring. Possible contribution from rheumatoid arthritis, stress, or deconditioning. - Clear to resume exercise gradually. - Discuss stress and anxiety management with Doctor South. - Consider sleep apnea evaluation with pulmonologist.  Disposition: Follow-up with Brandy Emmer, MD or APP in 6 months   Signed, Wyn Raddle, Jackee Shove, NP 11/27/2023, 12:23 PM [facility] "

## 2023-11-28 ENCOUNTER — Encounter: Payer: Self-pay | Admitting: Nurse Practitioner

## 2023-11-28 ENCOUNTER — Ambulatory Visit: Attending: Nurse Practitioner | Admitting: Nurse Practitioner

## 2023-11-28 VITALS — BP 100/54 | HR 70 | Ht 63.0 in | Wt 149.0 lb

## 2023-11-28 DIAGNOSIS — E785 Hyperlipidemia, unspecified: Secondary | ICD-10-CM | POA: Diagnosis not present

## 2023-11-28 DIAGNOSIS — I25119 Atherosclerotic heart disease of native coronary artery with unspecified angina pectoris: Secondary | ICD-10-CM | POA: Diagnosis not present

## 2023-11-28 DIAGNOSIS — J4540 Moderate persistent asthma, uncomplicated: Secondary | ICD-10-CM

## 2023-11-28 DIAGNOSIS — I10 Essential (primary) hypertension: Secondary | ICD-10-CM | POA: Diagnosis not present

## 2023-11-28 DIAGNOSIS — R5383 Other fatigue: Secondary | ICD-10-CM | POA: Diagnosis not present

## 2023-11-28 DIAGNOSIS — R002 Palpitations: Secondary | ICD-10-CM

## 2023-11-28 NOTE — Patient Instructions (Signed)
 Medication Instructions:  TAKE Verapamil  daily as needed *If you need a refill on your cardiac medications before your next appointment, please call your pharmacy*  Lab Work: None ordered  If you have labs (blood work) drawn today and your tests are completely normal, you will receive your results only by: MyChart Message (if you have MyChart) OR A paper copy in the mail If you have any lab test that is abnormal or we need to change your treatment, we will call you to review the results.  Testing/Procedures: None ordered  Follow-Up: At [facility], you and your health needs are our priority.  As part of our continuing mission to provide you with exceptional heart care, our providers are all part of one team.  This team includes your primary Cardiologist (physician) and Advanced Practice Providers or APPs (Physician Assistants and Nurse Practitioners) who all work together to provide you with the care you need, when you need it.  Your next appointment:   6 month(s)  Provider:   Maude Emmer, MD    We recommend signing up for the patient portal called MyChart.  Sign up information is provided on this After Visit Summary.  MyChart is used to connect with patients for Virtual Visits (Telemedicine).  Patients are able to view lab/test results, encounter notes, upcoming appointments, etc.  Non-urgent messages can be sent to your provider as well.   To learn more about what you can do with MyChart, go to [email or web address].   Other Instructions

## 2023-11-29 ENCOUNTER — Ambulatory Visit: Admitting: Physician Assistant

## 2023-11-29 DIAGNOSIS — M0579 Rheumatoid arthritis with rheumatoid factor of multiple sites without organ or systems involvement: Secondary | ICD-10-CM | POA: Diagnosis not present

## 2023-12-01 DIAGNOSIS — R002 Palpitations: Secondary | ICD-10-CM | POA: Diagnosis not present

## 2023-12-01 DIAGNOSIS — E785 Hyperlipidemia, unspecified: Secondary | ICD-10-CM | POA: Diagnosis not present

## 2023-12-01 DIAGNOSIS — R5382 Chronic fatigue, unspecified: Secondary | ICD-10-CM | POA: Diagnosis not present

## 2023-12-01 DIAGNOSIS — I2510 Atherosclerotic heart disease of native coronary artery without angina pectoris: Secondary | ICD-10-CM | POA: Diagnosis not present

## 2023-12-01 DIAGNOSIS — E042 Nontoxic multinodular goiter: Secondary | ICD-10-CM | POA: Diagnosis not present

## 2023-12-01 DIAGNOSIS — I10 Essential (primary) hypertension: Secondary | ICD-10-CM | POA: Diagnosis not present

## 2023-12-06 DIAGNOSIS — M0579 Rheumatoid arthritis with rheumatoid factor of multiple sites without organ or systems involvement: Secondary | ICD-10-CM | POA: Diagnosis not present

## 2023-12-12 DIAGNOSIS — E663 Overweight: Secondary | ICD-10-CM | POA: Diagnosis not present

## 2023-12-12 DIAGNOSIS — M1991 Primary osteoarthritis, unspecified site: Secondary | ICD-10-CM | POA: Diagnosis not present

## 2023-12-12 DIAGNOSIS — M810 Age-related osteoporosis without current pathological fracture: Secondary | ICD-10-CM | POA: Diagnosis not present

## 2023-12-12 DIAGNOSIS — R9389 Abnormal findings on diagnostic imaging of other specified body structures: Secondary | ICD-10-CM | POA: Diagnosis not present

## 2023-12-12 DIAGNOSIS — Z79899 Other long term (current) drug therapy: Secondary | ICD-10-CM | POA: Diagnosis not present

## 2023-12-12 DIAGNOSIS — M7989 Other specified soft tissue disorders: Secondary | ICD-10-CM | POA: Diagnosis not present

## 2023-12-12 DIAGNOSIS — M0579 Rheumatoid arthritis with rheumatoid factor of multiple sites without organ or systems involvement: Secondary | ICD-10-CM | POA: Diagnosis not present

## 2023-12-12 DIAGNOSIS — M25531 Pain in right wrist: Secondary | ICD-10-CM | POA: Diagnosis not present

## 2023-12-12 DIAGNOSIS — Z6825 Body mass index (BMI) 25.0-25.9, adult: Secondary | ICD-10-CM | POA: Diagnosis not present

## 2023-12-15 ENCOUNTER — Ambulatory Visit (HOSPITAL_BASED_OUTPATIENT_CLINIC_OR_DEPARTMENT_OTHER)
Admission: RE | Admit: 2023-12-15 | Discharge: 2023-12-15 | Disposition: A | Discharge status: Home / Self Care | Source: Ambulatory Visit | Attending: Internal Medicine | Admitting: Internal Medicine

## 2023-12-15 DIAGNOSIS — R918 Other nonspecific abnormal finding of lung field: Secondary | ICD-10-CM | POA: Diagnosis not present

## 2023-12-15 DIAGNOSIS — M069 Rheumatoid arthritis, unspecified: Secondary | ICD-10-CM | POA: Insufficient documentation

## 2023-12-15 DIAGNOSIS — J4540 Moderate persistent asthma, uncomplicated: Secondary | ICD-10-CM | POA: Insufficient documentation

## 2023-12-15 DIAGNOSIS — S2231XA Fracture of one rib, right side, initial encounter for closed fracture: Secondary | ICD-10-CM | POA: Insufficient documentation

## 2023-12-15 DIAGNOSIS — I700 Atherosclerosis of aorta: Secondary | ICD-10-CM | POA: Diagnosis not present

## 2023-12-27 DIAGNOSIS — M0579 Rheumatoid arthritis with rheumatoid factor of multiple sites without organ or systems involvement: Secondary | ICD-10-CM | POA: Diagnosis not present

## 2024-01-07 ENCOUNTER — Ambulatory Visit: Payer: Self-pay | Admitting: Internal Medicine

## 2024-01-07 NOTE — Progress Notes (Signed)
 See you in a few days Brandy Townsend  IMPRESSION: 1. 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 This could be postinflammatory, or less likely might reflect chronic atypical lung infection (middle lobes and lower lobe spared). Chronically stable mild nodularity of the tracheal wall also appears to be benign.   2. Recommend ensuring patient up-to-date on screening Mammography: Asymmetric Right breast tissue appears grossly stable from a 2023 CTA.   3. Aortic Atherosclerosis (ICD10-I70.0). Coronary artery atherosclerosis.     Electronically Signed   By: VEAR Hurst M.D.   On: 12/21/2023 13:30

## 2024-01-09 ENCOUNTER — Ambulatory Visit: Admitting: Internal Medicine

## 2024-01-09 ENCOUNTER — Encounter: Payer: Self-pay | Admitting: Internal Medicine

## 2024-01-09 VITALS — BP 118/64 | HR 69 | Temp 98.3°F | Ht 63.0 in | Wt 148.4 lb

## 2024-01-09 DIAGNOSIS — J4540 Moderate persistent asthma, uncomplicated: Secondary | ICD-10-CM | POA: Diagnosis not present

## 2024-01-09 DIAGNOSIS — M069 Rheumatoid arthritis, unspecified: Secondary | ICD-10-CM | POA: Diagnosis not present

## 2024-01-09 DIAGNOSIS — R9230 Dense breasts, unspecified: Secondary | ICD-10-CM

## 2024-01-09 DIAGNOSIS — R233 Spontaneous ecchymoses: Secondary | ICD-10-CM | POA: Diagnosis not present

## 2024-01-09 MED ORDER — BREZTRI AEROSPHERE 160-9-4.8 MCG/ACT IN AERO: 2.0000 | INHALATION_SPRAY | Freq: Two times a day (BID) | RESPIRATORY_TRACT | Status: AC

## 2024-01-09 NOTE — Progress Notes (Signed)
 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 Loetta, intermittent chest tightness, no wheezing or cough   wheezng started roughly the same year she quit smoking assoc with wt gain x 35lb wt gain typically better p prednisone  and better since spring 2017  On asthmanex 2 each am with rare saba needed but then Feb 10 2016 chest tightness and did not try saba > ER 02/21/16 with w/u neg w/u except for a/f levels both max sinuses> Rx Rosen= prednsione > back to baseline at time of ov with no cough or sob or chest tightness and  Not limited by breathing from desired activities including treadmill at 3.5 mph flat x 1.5 miles  Other than assoc of flares with cold/sinus infections >> No obvious patterns in day to day or daytime variability or assoc excess/ purulent sputum or mucus plugs or hemoptysis or cp or chest tightness, subjective wheeze or overt   hb symptoms. No unusual exp hx or h/o childhood pna/ asthma or knowledge of premature birth. 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  No new complaints. Pt notes joint pain in hands x 3 months -- could this be from the Symbicort   generalized stiffness both hands gradual onset persistent daily despite cutting back on symb Not needing rescue at all  rec Stop symbicort  to see what difference it makes and if breathing worse start back  If hands only bother you while on symbicort  we need to find you an alternatives Only use your albuterol  as a rescue medication  If not satisfied return with your formulary list of alternatives   Dx of RA by Brandy Townsend >  Brandy Townsend > rx mtx started  June 6th 2018    01/13/2017  f/u ov/Brandy Townsend re: 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 She has not had to use her albuterol  inhaler at all and has only been using the symbicort  1-2 puffs every am.   symb 80 1-2 each am at most never using the 2 bid max  Not limited by breathing from desired activities   No cough   No obvious day to day or  daytime variability or assoc excess/ purulent sputum or mucus plugs or hemoptysis or cp or chest tightness, subjective wheeze or overt sinus or hb symptoms. No unusual exp hx or h/o childhood pna/ asthma or knowledge of premature birth.  Sleeping ok flat without nocturnal  or early am exacerbation  of respiratory  c/o's or need for noct saba. Also denies any obvious fluctuation of symptoms with weather or environmental changes or other aggravating or alleviating factors except as outlined above   Current Allergies, Complete Past Medical History, Past Surgical History, Family History, and Social History were reviewed in Brandy Townsend record.  ROS  The following are not active complaints unless bolded Hoarseness, sore throat, dysphagia, dental problems, itching, sneezing,  nasal congestion or discharge of excess mucus or purulent secretions,  ear ache,   fever, chills, sweats, unintended wt loss or wt gain, classically pleuritic or exertional cp,  orthopnea pnd or leg swelling, presyncope, palpitations, abdominal pain, anorexia, nausea, vomiting, diarrhea  or change in bowel habits or change in bladder habits, change in stools or change in urine, dysuria, hematuria,  rash, arthralgias, visual complaints, headache, numbness, weakness or ataxia or problems with walking or coordination,  change in mood/affect or memory.       OV 08/29/2019  Subjective:  Patient ID: Brandy Townsend, female , DOB: 1950-09-11 , age 73 y.o. , MRN: [id number] , ADDRESS: [address] [place] [address]   08/29/2019 -   Chief Complaint  Patient presents with   Consult    Abnormal CT, hx of asthma. hx of low oxygen level and cough.    Transfer of care from Brandy Townsend to Brandy Townsend.  HPI Brandy Townsend 73 y.o. -referred for possible abnormalities in the CT scan of the chest in April 2021 in the setting of rheumatoid arthritis 2018 (RF + and CCP > 250 per Brandy Townsend over phone) and being on Arava and longstanding history of allergic asthma on dulera . 20 pack former smoking   73 year old female.  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  She reports a many year many decade history of allergic asthma which flares up in the fall.  Between episodes she is not on any maintenance treatment.  In the fall when it flares up her previous primary care physician Brandy. Loetta chou retired] would give her antibiotics and prednisone  one course and this would help it.  However in 2020 when the flareup happened she could not get access to the prednisone  and antibiotics because the primary care physician retired and in the setting of Covid it was televisit.  By the end of 2020 she ended up with 2 rounds of antibiotics and prednisone  that helped clear things up.  In the interim in the last 4 years she has had a diagnosis of  rheumatoid arthritis.  She was on methotrexate but had raised liver function test.  This seemed to help.  She failed Plaquenil and then was started on leflunomide which she is on currently.   In 2018 she did have spirometry and DLCO on pulmonary function testing that shows hyperinflation and air trapping with reduced DLCO but also normal spirometry.  In the backdrop of this in January 2020 when she underwent her first Covid vaccine.  Then in early February 2020 when she had a second Covid vaccine.  24 hours later she got hypotensive tachycardic and also hypoxemic and ended up in the ER.  I reviewed the  ER notes.  She was observed in the ER for whole day and her symptoms and signs resolved and the hypoxemia also resolved.  She was discharged with a prednisone  course.  She says subsequent to that she was short of breath and fatigue for 2 weeks and this also resolved.  She did see cardiology in February 2021.  She was reassured by a normal stress test.  After that she switched primary care physicians to Brandy. Garnette Ore.  She is now established with a new rheumatologist at [facility] and is seen Brandy Townsend the physician's assistant for the first time on August 27, 2019 2 days ago.  She also had a thyroid  biopsy which was nondiagnostic.  On the rheumatology visit 2 days ago it was felt her rheumatoid arthritis was active.  Blood has been drawn and work-up is in progress.  Through all this primary care physician did do CT chest without contrast.  I personally visualized this.  There is evidence of tree-in-bud that is mild and subtle.  Therefore she is referred here.  However at this point in time she is asymptomatic from a respiratory standpoint no wheezing no orthopnea no proximal nocturnal dyspnea no dyspnea on exertion no cough.  However in terms of rheumatoid arthritis there is plans to increase her immunomodulation. She has been started on low dose pred now by Brandy Townsend. Currently labs  pending are CRP, Quant Gold and Hep Panel   D/w Brandy Townsend later -> she is considering adding DMARD -discussed and she is supportive of getting bronch and rule out opportunistic infection  ROS - per HPI  IMPRESSION: CT chest visualized 1. Very mild, stable tree-in-bud appearing opacities along the anterolateral aspect of the bilateral upper lobes. Sequelae associated with atypical infection cannot be excluded. 2. Predominant stable thyroid  goiter.   Aortic Atherosclerosis (ICD10-I70.0).     Electronically Signed   By: Suzen Dials M.D.   On: 07/22/2019 22:34    OV 10/17/2019  Subjective:  Patient ID: Brandy Townsend, female , DOB: [birth year] , age 89 y.o. , MRN: [id number] , ADDRESS: [address] [place] [address]   10/17/2019 -   Chief Complaint  Patient presents with   Follow-up    Pt is here to discuss results of the bronch. Pt states she has been doing good since last visit and denies any complaints.     HPI Brandy Townsend 73 y.o. -returns for follow-up.  At this point in time she is on prednisone  20 mg/day.  She has gained weight.  She is got some bruises.  She is not happy about her prednisone .  She denies any respiratory complaints.  Mid June 2020 when she underwent bronchoscopy with lavage.  I reviewed the results.  She is got some mild neutrophilia but organisms such as PCP and TB and fungus are negative.  She really wants to get started on a DMA RD so she can come down or get off prednisone .  I discussed with rheumatology PA Brandy Townsend and she is of the understanding.  I stated there is no ILD.  They are going to focus on joint treatment.  Patient did indicate that in the fall she is at risk for respiratory exacerbations.  But she lives nearby and is agreeable to a 9-72-month follow-up.  She is supposed to get pulmonary function test today but that was not scheduled by our office.  Results for Hasley, Janara G (MRN [id number]) as of 10/17/2019 16:38  Ref.  Range 09/12/2019 08:59  Monocyte-Macrophage-Serous Fluid Latest Ref Range: 50 - 90 % 38 (L)  Other Cells, Fluid Latest Units: % CORRELATE WITH CYTOLOGY.  Fluid Type-FCT Unknown Bronch Lavag  Color, Fluid Latest Ref Range: YELLOW  COLORLESS (A)  Total Nucleated Cell Count, Fluid Latest Ref Range: 0 - 1,000 cu mm 15  Lymphs, Fluid Latest Units: % 4  Eos, Fluid Latest Units: % 2  Appearance, Fluid Latest Ref Range: CLEAR  CLOUDY (A)  Neutrophil Count, Fluid Latest Ref Range: 0 - 25 % 56 (H)   ROS - per HPI   Patient ID: Brandy Townsend, female    DOB: 1950/11/01, 73 y.o.   MRN: [id number]  Chief Complaint  Patient presents with   Follow-up    Cough     Referring provider: Nichole Senior, MD    12/10/2019 Acute OV : Cough  HPI: 73 year old female former smoker followed for abnormal CT chest with pulmonary infiltrates Medical history significant for rheumatoid arthritis    Patient presents for an acute office visit.  She complains of 1 week increased cough,congestion , and wheezing . Mucus is clear , hard to cough anything up.  She has underlying rheumatoid arthritis and is recently had her maintenance regimen changed to methotrexate in infliximab .    She had recent Covid test has been negative.  Her Covid vaccines are up-to-date.  She denies any fever or loss of taste or smell.   Patient is being followed for mild pulmonary infiltrates noted on CT chest.  She is negative for interstitial lung disease.  She underwent bronchoscopy in June 2021 that showed negative cultures and BAL.  Patient is followed by rheumatology and is on methotrexate and Renflexis      TEST/EVENTS :  In 2018 she did have spirometry and DLCO on pulmonary function testing that shows hyperinflation and air trapping with reduced DLCO but also normal spirometry.  CT chest July 22, 2019 showed very mild stable tree-in-bud appearing opacities along the bilateral upper lobes.  Bronchoscopy June 2021 showed cytology  positive for benign bronchial cells and pulmonary macrophages.,  AFB negative, BAL negative, M tubercular goes complex negative, fungal negative PCP negative   OV 01/23/2020  Subjective:  Patient ID: Brandy Townsend, female , DOB: [birth year] , age 40 y.o. , MRN: [id number] , ADDRESS: [address] [place] [address] PCP Nichole Senior, MD Patient Care Team: Nichole Senior, MD as PCP - General (Endocrinology) Rox Charleston, MD as PCP - OBGYN (Obstetrics and Gynecology) Delford Maude BROCKS, MD as PCP - Cardiology (Cardiology)  This Provider for this visit: Treatment Team:  Attending Provider: Geronimo Amel, MD    01/23/2020 -   Chief Complaint  Patient presents with   Follow-up    Pt states she has been doing okay since last visit. States her breathing has been doing okay. Still becomes SOB but it happens out of nowhere and she isn't sure why or if it is coming due to her heart.   Rheumatoid arthritis with immunosuppression and mild pulmonary infiltrates.  BAL with neutrophilia blood culture negative-June 2021  Long history of allergic asthma previously on Dulera .  HPI Brandy Townsend 73 y.o. -returns for follow-up.  Since last seeing me she is seen nurse practitioner in September 2021.  At that time she was wheezing.  She was given Depo-Medrol  and outpatient Omnicef .  She says within a few days she responded to this.  Since then she feels back to baseline but she says  even at baseline she is short of breath with exertion relieved by rest.  She says is not normal for her.  She recently had a cardiac cath and she reports this is normal.  I reviewed the chart and agree with that.  She is also dealing with her husband has esophageal cancer.  Recently was in the ER because of' false Hyperkalemia according to history.  She is currently on TNF alpha blockade and methotrexate for her rheumatoid arthritis.  She says the joint symptoms have not fully improved.  She is waiting for the third  dose of the TNF alpha blockade before she is expecting to show improvement in her symptoms.  Overall the last few months have been high level of social stress.  She thinks it is slowly settling down.  She feels like things are changed after getting her Covid vaccine.  Nevertheless she understands she is immunosuppressed and she is in need of a booster.  She has some trepidation about getting the booster.  She is willing to have a IgG antibody levels checked as a means of triage her understanding antibody response to previous Covid vaccine.  Today she pulmonary function test in my view shows obstruction with significant bronchodilator response consistent with an asthma pattern.  Documented below.  I reviewed the graph.  Nitric oxide  test and asthma control test scores are listed below and shows disease activity   Asthma Control Test ACT Total Score  01/23/2020 18  08/29/2019 24     Lab Results  Component Value Date   NITRICOXIDE 34 01/23/2020      OV 03/04/2021  Subjective:  Patient ID: Brandy Townsend, female , DOB: 04/19/1950 , age 28 y.o. , MRN: [id number] , ADDRESS: [address] [place] [address] PCP Nichole Senior, MD Patient Care Team: Nichole Senior, MD as PCP - General (Endocrinology) Rox Charleston, MD as PCP - OBGYN (Obstetrics and Gynecology) Delford Maude BROCKS, MD as PCP - Cardiology (Cardiology)  This Provider for this visit: Treatment Team:  Attending Provider: Geronimo Amel, MD    03/04/2021 -   Chief Complaint  Patient presents with   Follow-up    No new concerns     HPI Brandy Townsend 73 y.o. -returns for follow-up.  She tells me overall her asthma is doing well.  In September/October 2022 Brandy. Reyes Loader added Singulair .  She was referred to allergist.  Work-up there has been reassuring.  She would not plan to go back to allergy clinic again.  She saw Brandy. Frutoso.  She feels the Dulera  and Singulair  have helped her.  She wants a refill on her albuterol .   Her ACT control score shows improvement.  However her exhaled nitric oxide  score is 55 and is borderline high.  However she is not feeling it.  Of note she had a fourth COVID mRNA booster vaccine against delta and oh micron.  However 2 days after that she got significant side effects of joint flare and like fatigue.  She does not want to do the mRNA vaccine again.  This is second time she has had significant side effects.  She needed prednisone  this time.  After the second shot she ended up in the ER with tachycardia.  I have advised her that mRNA vaccine should be listed as allergy.  She is agreeable.     OV 06/01/2021  Subjective:  Patient ID: Brandy Townsend, female , DOB: 1951-01-16 , age 29 y.o. , MRN: [id number] , AD[address]ESS: [address]  [place] [address] PCP Nichole Senior, MD Patient Care Team: Nichole Senior, MD as PCP - General (Endocrinology) Rox Charleston, MD as PCP - OBGYN (Obstetrics and Gynecology) Delford Maude BROCKS, MD as PCP - Cardiology (Cardiology)  This Provider for this visit: Treatment Team:  Attending Provider: Geronimo Amel, MD   06/01/2021 -   Chief Complaint  Patient presents with   Acute Visit    Pt has had complaints of cough and chest tightness which began overnight 3/6.    Rheumatoid arthritis with immunosuppression and mild pulmonary infiltrates.  BAL with neutrophilia blood culture negative-June 2021  Long history of allergic asthma HPI Brandy Townsend 73 y.o. -this is an acute visit.  She tells me that a few weeks ago she had cough and it went away.  Then she went to [place] .  She has returned and has been doing well.  No sick contacts.  Then all of a sudden she woke up in the middle of the night with chest pain.  It was as though there was some sharp sensation in the sternal area.  She reports history of normal stress test within a year.  She did have normal troponins in 2022 and 2021.  Review of the records indicate low risk cardiac stress  test February 2021.  Cardiac cath in October 2021 showed nonobstructive coronary artery disease with moderate disease in the first OM.  But in any event later she started having significant cough with green sputum.  She is also short of breath and fatigue.  The chest pain was associated with the cough.     12/02/2021 Follow up : Asthma  Patient presents for a 22-month follow-up.  Patient has moderate persistent asthma with an allergic phenotype.  She says overall she is doing very well.  Asthma is been under good control.  She denies any flare of cough or wheezing.  ACT score today is 24.  Exhaled nitric oxide  testing is at 62 ppb.  She remains on Breo inhaler daily.  She takes Zyrtec and Singulair  daily. No increased albuterol  use .   She does have rheumatoid arthritis followed by rheumatology on Orencia . Did have flare this summer treated with steroids , none since.   Very active.  Caregiver for her husband. Husband has had esophageal cancer, has feeding tube. Goes in and out of hospital.  Has went back to work partime , 1 day a week. At [employer] .  This is known to cause asthma exacerbation.  Her maintenance medication for asthma includes Breo and Singulair   She continues to deal with easy bruising on her forearms and arms that are pressure related.  She says it is idiopathic.  She believes her platelets are normal.   OV 06/07/2022  Subjective:  Patient ID: Brandy Townsend, female , DOB: [birth year] , age 64 y.o. , MRN: [id number] , ADDRESS: [address] [place] [address] PCP Nichole Senior, MD Patient Care Team: Nichole Senior, MD as PCP - General (Endocrinology) Rox Charleston, MD as PCP - OBGYN (Obstetrics and Gynecology) Delford Maude BROCKS, MD as PCP - Cardiology (Cardiology)  This Provider for this visit: Treatment Team:  Attending Provider: Geronimo Amel, MD    06/07/2022 -   Chief Complaint  Patient presents with   Follow-up    Asthma well controlled.   Needs refill on Breo inhaler.  Has not used Breo in about a month.     HPI Brandy Townsend 73 y.o. -returns  for follow-up.  She last saw me personally in May 2023.  Then in September [address] Tammy Parrott.  At the time the exam nitric oxide  was slightly high.  Then after Christmas 2023 she had influenza and got another round of prednisone .  Currently she is doing well.  She ran out of Breo a month ago and she still doing well ACT scores are at baseline.  She is on Singulair  but she is out of [place].  She is wondering whether she should restart the Breo or not. FENO IS HIGH52ppb  Social - Husband who is esophageal cancer and has a feeding tube is now getting at least 350 cal orally.  He is eating peaches and cheese crackers. - Daughter is in [place] -currently she is safe    OV 01/13/2023  Subjective:  Patient ID: Brandy Townsend, female , DOB: [birth year] , age 73 y.o. , MRN: [id number] , ADDRESS: [address] [place] [address] PCP Nichole Senior, MD Patient Care Team: Nichole Senior, MD as PCP - General (Endocrinology) Rox Charleston, MD as PCP - OBGYN (Obstetrics and Gynecology) Delford Maude BROCKS, MD as PCP - Cardiology (Cardiology)  This Provider for this visit: Treatment Team:  Attending Provider: Geronimo Amel, MD    01/13/2023 -   Chief Complaint  Patient presents with   Follow-up    Breathing is overall doing well. She has sneezing and cough with clear sputum in the mornings. FENO= 66 today.      HPI Brandy Townsend 73 y.o. -returns for follow-up.  I saw her last in the spring 2024.  After that overall she was stable but December 18, 2022 she returned to the emergency department with multiple acute complaints.  I personally reviewed the external records.  Visualized the CT scan and the blood work.  Diagnose of acute bronchitis based on CT scan findings pulmonary embolism ruled out.  Right upper lobe tree-in-bud infiltrates noted.  But I do not appreciate that.  She says  she was having head numbness shortness of breath chest pressure but no cough or wheezing.  She was treated with antibiotic and she is at baseline.  She feels great ACT score is good but nitric oxide  is actually gone up from her baseline of 50 to greater than 60.  She is surprised by this.  We she is interested in therapeutic options she is worried her asthma would be getting out of control.  Review of the labs indicate she always has high elevated baseline eosinophils.  Skin allergy testing with Brandy. Frutoso was positive some years ago  Of note for the last 2 years she is on Orencia  which is known to cause respiratory exacerbations particularly in COPD patients.  She did have a recent infusion of that.  She thought her MCHC in the blood work was low but at least in September in our records it was normal and I showed her this.    FeNO 50    04/12/2023- Interim hx  Patient presents today for 6 week follow-up.   Discussed the use of AI scribe software for clinical note transcription with the patient, who gave verbal consent to proceed.  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 This was her first experience with general anesthesia. Post-surgery, the patient reported no pain but took prescribed hydrocodone  as a precaution. Approximately 30 minutes later, she woke  up with discomfort in the back of her head. Upon checking her oxygen level, it was found to be at 80. After using her rescue inhaler twice, the level increased to 84, but she was advised to go to the ER.  At the ER, the patient's oxygen level fluctuated, dropping low but rebounding quickly. She was discharged the next day, but her oxygen levels remained lower than usual, staying around 93-94. The patient has not experienced levels below 92 since the surgery.  In the week following the surgery, the patient reported  feeling like she developed a kidney stone, had a stomach bug, and picked up a congestion bug from an ER visit with her husband. Despite these issues, her oxygen levels have not dropped below 93.  The patient has been using an incentive spirometer almost daily since the surgery. She also reported a cough, but it is not producing anything and she believes it is more related to postnasal drip. The patient has been using Breztri  for her asthma and her nitric oxide  levels have decreased from 60 to 50 since starting this medication.  The patient also has a history of rheumatoid arthritis and has been on Orencia  for two and a half years. She reported that her hand pain, which was previously severe, has significantly improved since switching her turmeric supplement in mid-October. She has a little hip pain, but no other pain.        OV 06/29/2023  Subjective:  Patient ID: Brandy Townsend, female , DOB: 1950/08/05 , age 80 y.o. , MRN: [id number] , ADDRESS: [address] [place] [address] PCP Nichole Senior, MD Patient Care Team: Nichole Senior, MD as PCP - General (Endocrinology) Rox Charleston, MD as PCP - OBGYN (Obstetrics and Gynecology) Delford Maude BROCKS, MD as PCP - Cardiology (Cardiology)  This Provider for this visit: Treatment Team:  Attending Provider: Geronimo Amel, MD    06/29/2023 -   Chief Complaint  Patient presents with   Follow-up    Breathing has overall been stable. She fractured rib picking up a heavy box a few days ago.      HPI Brandy Townsend 73 y.o. -returns for follow-up.  From an asthma perspective she is doing well.  She has tympanoplasty and she is recovering although during the tympanoplasty she says she desaturated.  The acute issue is that 1 her breast tree which works really well for his very expensive her insurance coverage this week so I gave her 2 months worth of samples giving her time to inquire with insurance about an alternative.  In addition on March  30/2025 she is picking up somewhat of a heavy box but she has been doing this for 5 years.  Contains liquid drinks for her husband who has a PEG tube.  Later that evening she had a pop and since then excruciating pain.  She has a right rib 10 fracture.  She is communicating with the primary care physician about it.  Ends of note in September 2024 showed pulm infiltrates on the CT and this requires a 1 year follow-up.  She continues to be immunosuppressed.  Otherwise no new issues.   OV 01/09/2024  Subjective:  Patient ID: Brandy Townsend, female , DOB: 1951/01/16 , age 54 y.o. , MRN: [id number] , ADDRESS: [address] 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  BAL with neutrophilia blood culture negative-June 2021  Long history of allergic asthma previously on Dulera .  01/09/2024 -   Chief Complaint  Patient presents with   Interstitial Lung Disease    Pt states she has had not problems with breathing since LOV     HPI Brandy Townsend 73 y.o. -returns for routine follow-up of asthma in the setting of rheumatoid arthritis.  She is overall doing well symptoms are minimal she is exercising regularly.  It has been 5 years since her husband had cancer in remission.  They travel to [place]  which was his first trip.  They enjoyed this.  Her only main complaint is that she has easy bruising.  Between April 2025 and August 2025 she stopped her baby aspirin  but the bruising continued.  In August 2020 for she got admitted to the hospital with unstable angina per chart review.  After that she was restarted on aspirin .  This been no change in  bruising because of the aspirin .  She is doing well she is exercising.  I did indicate to her the breast tree which is inhaled steroid could be contributing to the bruising but she feels it is helping for her.  She pays a lot of money for this but she feels it is the best inhaler.  Gave her some samples.  She does not want to stop the Breztri .  Her general symptoms and exercise hypoxemia test is listed below.  Of note her CT scan of the chest showed some asymmetric breast tissue on the right side.  Unclear if it was chronic from 2023 or new.  I did show her the arrow by personal visualization of the image by the radiologist pointed out.  I asked her to talk to her primary care physician Brandy. Nichole.   SYMPTOM SCALE -general 01/09/2024  Current weight   O2 use ra  Shortness of Breath 0 -> 5 scale with 5 being worst (score 6 If unable to do)  At rest 0  Simple tasks - showers, clothes change, eating, shaving 0  Household (dishes, doing bed, laundry) 1  Shopping 0  Walking level at own pace 1  Walking up Stairs 1  Total (30-36) Dyspnea Score 3  How bad is your cough? 0  How bad is your fatigue 3  How bad is appetiee 2  How bad is nausea 0  How bad is vomiting?  0  How bad is diarrhea? 0  How bad is anxiety? 3  How bad is depression 0  Any chronic pain - if so where and how bad Easy bruosing all limbs        SIT STAND TEST - goal 15 times   01/09/2024    O2 used ra   PRobe - finter or forehead finger   Number sit and stand completed - goal 15 15   Time taken to complete 38 sec   Resting Pulse Ox/HR/Dyspnea  99% and 74/min and dyspnea of 0/10    Peak measures 98 % and 92/min and dyspnea of 1/10   Final Pulse Ox/HR 100% and 71/min and dyspnea of 0/10   Desaturated </= 88% no   Desaturated <= 3% points no   Got Tachycardic >/= 90/min yes   Miscellaneous comments x      CT Chest  data from date: 12/15/23  - personally visualized and independently interpreted : yes - my  findings are: as below  IMPRESSION: 1. 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 This could be postinflammatory, or less likely might reflect chronic atypical lung infection (middle lobes and lower lobe spared). Chronically stable mild nodularity of the tracheal wall also appears to be benign.   2. Recommend ensuring patient up-to-date on screening Mammography: Asymmetric Right breast tissue appears grossly stable from a 2023 CTA.   3. Aortic Atherosclerosis (ICD10-I70.0). Coronary artery atherosclerosis.     Electronically Signed   By: VEAR Hurst M.D.   On: 12/21/2023 13:3  PFT     Latest Ref Rng & Units 01/23/2020    8:43 AM 01/13/2017    8:40 AM  PFT Results  FVC-Pre L 2.08  2.43   FVC-Predicted Pre % 69  79   FVC-Post L 2.41  2.46   FVC-Predicted Post % 80  80   Pre FEV1/FVC % % 72  73   Post FEV1/FCV % % 75  76   FEV1-Pre L 1.51  1.77   FEV1-Predicted Pre % 66  75   FEV1-Post L 1.81  1.87   DLCO uncorrected ml/min/mmHg 18.49  16.68   DLCO UNC% % 97  72   DLCO corrected ml/min/mmHg 18.38  17.68   DLCO COR %Predicted % 96  77   DLVA Predicted % 108  83   TLC L 5.38  5.90   TLC % Predicted % 109  120   RV % Predicted % 142  162        LAB RESULTS last 96 hours No results found.       has a past medical history of Asthma, CAD (coronary artery disease), Diverticulosis, Graves disease, High cholesterol, Hypertension, Hypothyroidism, Nephrolithiasis, Osteoporosis, Rheumatoid arthritis (HCC), and Vertigo.   reports that she quit smoking about 15 years ago. Her smoking use included cigarettes. She started smoking about 35 years ago. She has a 20 pack-year smoking history. She has never used smokeless tobacco.  Past Surgical History:  Procedure Laterality Date   BRONCHIAL WASHINGS  09/12/2019   Procedure: BRONCHIAL WASHINGS;  Surgeon:  Townsend Amel, MD;  Location: [facility];  Service: Endoscopy;;   CORONARY PRESSURE/FFR STUDY N/A 01/08/2020   Procedure: INTRAVASCULAR PRESSURE WIRE/FFR STUDY;  Surgeon: Swaziland, Peter M, MD;  Location[facility] INVASIVE CV LAB;  Service: Cardiovascular;  Laterality: N/A;   INNER EAR SURGERY Right 02/27/2023   LEFT HEART CATH AND CORONARY ANGIOGRAPHY N/A 01/08/2020   Procedure: LEFT HEART CATH AND CORONARY ANGIOGRAPHY;  Surgeon: Swaziland, Peter M, MD;  Location: [facility] INVASIVE CV LAB;  Service: Cardiovascular;  Laterality: N/A;   LEFT HEART CATH AND CORONARY ANGIOGRAPHY N/A 11/13/2023   Procedure: LEFT HEART CATH AND CORONARY ANGIOGRAPHY;  Surgeon: Mady Bruckner, MD;  Location: [facility] INVASIVE CV LAB;  Service: Cardiovascular;  Laterality: N/A;   TYMPANOPLASTY Right 02/27/2023   Procedure: TYMPANOPLASTY;  Surgeon: Jesus Oliphant, MD;  Location: [facility];  Service: ENT;  Laterality: Right;   VIDEO BRONCHOSCOPY N/A 09/12/2019   Procedure: VIDEO BRONCHOSCOPY WITHOUT FLUORO;  Surgeon: Townsend Amel, MD;  Location: [facility];  Service: Endoscopy;  Laterality: N/A;    Allergies  Allergen Reactions   Cat Dander     Other reaction(s): congestion   Dust Mite Extract     Other reaction(s): Unknown  Macrobid [Nitrofurantoin Monohyd Macro] Other (See Comments)    Lowered potassium, caused aches and pains   Morphine  And Codeine Nausea And Vomiting    Immunization History  Administered Date(s) Administered   Fluad  Quad(high Dose 65+) 01/20/2020, 12/31/2021   Fluad  Trivalent(High Dose 65+) 01/09/2023   INFLUENZA, HIGH DOSE SEASONAL PF 01/03/2017, 12/27/2018, 03/03/2021   Influenza Whole 01/04/2017   Influenza, Quadrivalent, Recombinant, Inj, Pf 01/09/2018, 01/11/2019   Influenza,inj,quad, With Preservative 12/26/2016   Influenza-Unspecified 12/26/2013, 12/27/2014, 12/27/2015   PFIZER(Purple Top)SARS-COV-2 Vaccination 04/17/2019, 05/06/2019, 01/31/2020   Pfizer Covid-19 Vaccine  Bivalent Booster 15yrs & up 01/22/2021   Pneumococcal Conjugate-13 04/28/2014, 05/26/2014   Pneumococcal Polysaccharide-23 11/22/2017, 03/03/2021   Respiratory Syncytial Virus Vaccine ,Recomb Aduvanted(Arexvy ) 03/10/2022   Zoster Recombinant(Shingrix) 01/14/2019, 04/02/2019   Zoster, Live 05/22/2013, 04/01/2019    Family History  Problem Relation Age of Onset   Breast cancer Mother    Heart disease Father    Cancer Daughter 61       stage 3 breast      Current Outpatient Medications:    abatacept  (ORENCIA ) 250 MG injection, Inject 250 mg into the vein See admin instructions. Inject 250 mg IV once every month., Disp: , Rfl:    albuterol  (VENTOLIN  HFA) 108 (90 Base) MCG/ACT inhaler, Inhale 1-2 puffs into the lungs every 6 (six) hours as needed for wheezing or shortness of breath., Disp: 8 g, Rfl: 5   aspirin  EC 81 MG tablet, Take 1 tablet (81 mg total) by mouth daily. Swallow whole., Disp: 90 tablet, Rfl: 3   budeson-glycopyrrolate -formoterol  (BREZTRI  AEROSPHERE) 160-9-4.8 MCG/ACT AERO, Inhale 2 puffs into the lungs in the morning and at bedtime., Disp: , Rfl:    budesonide -glycopyrrolate -formoterol  (BREZTRI  AEROSPHERE) 160-9-4.8 MCG/ACT AERO inhaler, Inhale 2 puffs into the lungs in the morning and at bedtime., Disp: , Rfl:    cetirizine (ZYRTEC) 10 MG tablet, Take 10 mg by mouth daily., Disp: , Rfl:    diclofenac  Sodium (VOLTAREN ) 1 % GEL, Apply 4 g topically 4 (four) times daily., Disp: 100 g, Rfl: 0   folic acid (FOLVITE) 1 MG tablet, 1 tablet Orally Once a day; Duration: 90 days, Disp: , Rfl:    levothyroxine  (SYNTHROID ) 75 MCG tablet, Take 1 tablet (75 mcg total) by mouth every morning on an empty stomach, Disp: 90 tablet, Rfl: 4   methotrexate (RHEUMATREX) 2.5 MG tablet, Take 10 mg by mouth once a week., Disp: , Rfl:    montelukast  (SINGULAIR ) 10 MG tablet, Take 1 tablet (10 mg total) by mouth daily at night, Disp: 90 tablet, Rfl: 3   Multiple Vitamins-Minerals (EQ MULTIVITAMINS  ADULT GUMMY PO), Take 1 each by mouth daily., Disp: , Rfl:    nitroGLYCERIN  (NITROSTAT ) 0.4 MG SL tablet, Place 1 tablet (0.4 mg total) under the tongue every 5 (five) minutes as needed for chest pain., Disp: 25 tablet, Rfl: 3   potassium chloride  (KLOR-CON ) 10 MEQ tablet, Take 1 tablet (10 mEq total) by mouth daily., Disp: 90 tablet, Rfl: 3   rosuvastatin  (CRESTOR ) 10 MG tablet, Take 1 tablet (10 mg total) by mouth daily., Disp: 90 tablet, Rfl: 2   torsemide  (DEMADEX ) 20 MG tablet, Take 1 tablet (20 mg total) by mouth in the morning AND ONE-HALF tablet (10 mg total) every evening., Disp: 135 tablet, Rfl: 4   Turmeric (QC TUMERIC COMPLEX PO), Take 2 tablets by mouth daily., Disp: , Rfl:    verapamil  (CALAN -SR) 120 MG CR tablet, TAKE 1 TABLET (120 MG TOTAL) BY MOUTH DAILY  AS NEEDED. (Patient taking differently: Take 80 mg by mouth daily.), Disp: 90 tablet, Rfl: 3   Vitamin D, Ergocalciferol, (DRISDOL) 1.25 MG (50000 UNIT) CAPS capsule, Take 50,000 Units by mouth every 7 (seven) days. Monday, Disp: , Rfl:       Objective:   Vitals:   01/09/24 0937  BP: 118/64  Pulse: 69  Temp: 98.3 F (36.8 C)  TempSrc: Oral  SpO2: 99%  Weight: 148 lb 6.4 oz (67.3 kg)  Height: 5' 3 (1.6 m)    Estimated body mass index is 26.29 kg/m as calculated from the following:   Height as of this encounter: 5' 3 (1.6 m).   Weight as of this encounter: 148 lb 6.4 oz (67.3 kg).  @WEIGHTCHANGE @  Filed Weights   01/09/24 0937  Weight: 148 lb 6.4 oz (67.3 kg)     Physical Exam   General: No distress. Looks well O2 at rest: no Cane present: no Sitting in wheel chair: no Frail: no Obese: no Neuro: Alert and Oriented x 3. GCS 15. Speech normal Psych: Pleasant Resp:  Barrel Chest - no.  Wheeze - no, Crackles - no, No overt respiratory distress CVS: Normal heart sounds. Murmurs - no Ext: Stigmata of Connective Tissue Disease - no HEENT: Normal upper airway. PEERL +. No post nasal drip         Assessment/     Assessment & Plan Moderate persistent asthma without complication  Rheumatoid arthritis involving multiple sites, unspecified whether rheumatoid factor present (HCC)  Easy bruising  Dense breast tissue    PLAN Patient Instructions     ICD-10-CM   1. Moderate persistent asthma without complication  J45.40     2. Rheumatoid arthritis involving multiple sites, unspecified whether rheumatoid factor present (HCC)  M06.9     3. Easy bruising  R23.3     4. Dense breast tissue  R92.30       Moderate persistent asthma without complication  - stable and well controlled  Plan  - Continue Breztri  daily; respite the fact you do not - Continue albuterol  as needed - Flu shot on your own  Easy bruising  -This did not go away.  Aspirin  will be in April 2025 and August 2025 =-I suspect inhaled steroid and Breztri  might be contributing to this  Plan - Continue to monitor because we took a shared decision making that he want to continue Breztri   Dense breast tissue  -Right-sided asymmetry in breast tissue reported in CT scan September 2025.  I do not know what this means  Plan - Please take this up with your primary care physician   follow-up - Return in 6-9 months or sooner if needed     FOLLOWUP    Return for  Return in 6-9 months or sooner if needed .    SIGNATURE    Brandy. Dorethia Cave, M.D., F.C.C.P,  Pulmonary and Critical Care Medicine Staff Physician, [facility] Director - Interstitial Lung Disease  Program  Pulmonary Fibrosis [facility] [place], [place], [address]  Pager: [phone], If no answer or between  15:00h - 7:00h: call [phone] Telephone: [phone]  5:58 PM 01/09/2024

## 2024-01-09 NOTE — Patient Instructions (Addendum)
 ICD-10-CM   1. Moderate persistent asthma without complication  J45.40     2. Rheumatoid arthritis involving multiple sites, unspecified whether rheumatoid factor present (HCC)  M06.9     3. Easy bruising  R23.3     4. Dense breast tissue  R92.30       Moderate persistent asthma without complication  - stable and well controlled  Plan  - Continue Breztri  daily; respite the fact you do not - Continue albuterol  as needed - Flu shot on your own  Easy bruising  -This did not go away.  Aspirin  will be in April 2025 and August 2025 =-I suspect inhaled steroid and Breztri  might be contributing to this  Plan - Continue to monitor because we took a shared decision making that he want to continue Breztri   Dense breast tissue  -Right-sided asymmetry in breast tissue reported in CT scan September 2025.  I do not know what this means  Plan - Please take this up with your primary care physician   follow-up - Return in 6-9 months or sooner if needed

## 2024-01-19 ENCOUNTER — Other Ambulatory Visit (HOSPITAL_BASED_OUTPATIENT_CLINIC_OR_DEPARTMENT_OTHER): Payer: Self-pay

## 2024-01-19 DIAGNOSIS — Z23 Encounter for immunization: Secondary | ICD-10-CM | POA: Diagnosis not present

## 2024-01-19 MED ORDER — FLUZONE HIGH-DOSE 0.5 ML IM SUSY
0.5000 mL | PREFILLED_SYRINGE | Freq: Once | INTRAMUSCULAR | 0 refills | Status: AC
  Filled 2024-01-19: qty 0.5, 1d supply, fill #0

## 2024-01-24 DIAGNOSIS — M0579 Rheumatoid arthritis with rheumatoid factor of multiple sites without organ or systems involvement: Secondary | ICD-10-CM | POA: Diagnosis not present

## 2024-01-31 DIAGNOSIS — H35033 Hypertensive retinopathy, bilateral: Secondary | ICD-10-CM | POA: Diagnosis not present

## 2024-02-08 ENCOUNTER — Other Ambulatory Visit (HOSPITAL_BASED_OUTPATIENT_CLINIC_OR_DEPARTMENT_OTHER): Payer: Self-pay

## 2024-02-08 ENCOUNTER — Emergency Department (HOSPITAL_COMMUNITY): Admission: EM | Admit: 2024-02-08 | Discharge: 2024-02-08 | Disposition: A | Discharge status: Home / Self Care

## 2024-02-08 ENCOUNTER — Emergency Department (HOSPITAL_COMMUNITY)

## 2024-02-08 DIAGNOSIS — Z7982 Long term (current) use of aspirin: Secondary | ICD-10-CM | POA: Diagnosis not present

## 2024-02-08 DIAGNOSIS — J4521 Mild intermittent asthma with (acute) exacerbation: Secondary | ICD-10-CM | POA: Insufficient documentation

## 2024-02-08 DIAGNOSIS — M47812 Spondylosis without myelopathy or radiculopathy, cervical region: Secondary | ICD-10-CM | POA: Diagnosis not present

## 2024-02-08 DIAGNOSIS — M4312 Spondylolisthesis, cervical region: Secondary | ICD-10-CM | POA: Diagnosis not present

## 2024-02-08 DIAGNOSIS — I2510 Atherosclerotic heart disease of native coronary artery without angina pectoris: Secondary | ICD-10-CM | POA: Diagnosis not present

## 2024-02-08 DIAGNOSIS — R531 Weakness: Secondary | ICD-10-CM | POA: Diagnosis not present

## 2024-02-08 DIAGNOSIS — R519 Headache, unspecified: Secondary | ICD-10-CM | POA: Insufficient documentation

## 2024-02-08 DIAGNOSIS — Z87891 Personal history of nicotine dependence: Secondary | ICD-10-CM | POA: Diagnosis not present

## 2024-02-08 DIAGNOSIS — S0990XA Unspecified injury of head, initial encounter: Secondary | ICD-10-CM | POA: Diagnosis not present

## 2024-02-08 DIAGNOSIS — W19XXXA Unspecified fall, initial encounter: Secondary | ICD-10-CM | POA: Diagnosis not present

## 2024-02-08 DIAGNOSIS — R110 Nausea: Secondary | ICD-10-CM | POA: Diagnosis not present

## 2024-02-08 DIAGNOSIS — M542 Cervicalgia: Secondary | ICD-10-CM | POA: Diagnosis not present

## 2024-02-08 DIAGNOSIS — R069 Unspecified abnormalities of breathing: Secondary | ICD-10-CM | POA: Diagnosis not present

## 2024-02-08 DIAGNOSIS — Z0389 Encounter for observation for other suspected diseases and conditions ruled out: Secondary | ICD-10-CM | POA: Diagnosis not present

## 2024-02-08 DIAGNOSIS — R0602 Shortness of breath: Secondary | ICD-10-CM | POA: Diagnosis present

## 2024-02-08 DIAGNOSIS — H538 Other visual disturbances: Secondary | ICD-10-CM | POA: Diagnosis not present

## 2024-02-08 DIAGNOSIS — M4802 Spinal stenosis, cervical region: Secondary | ICD-10-CM | POA: Diagnosis not present

## 2024-02-08 DIAGNOSIS — R42 Dizziness and giddiness: Secondary | ICD-10-CM | POA: Diagnosis not present

## 2024-02-08 DIAGNOSIS — Z79899 Other long term (current) drug therapy: Secondary | ICD-10-CM | POA: Insufficient documentation

## 2024-02-08 DIAGNOSIS — I959 Hypotension, unspecified: Secondary | ICD-10-CM | POA: Diagnosis not present

## 2024-02-08 DIAGNOSIS — S199XXA Unspecified injury of neck, initial encounter: Secondary | ICD-10-CM | POA: Diagnosis not present

## 2024-02-08 LAB — COMPREHENSIVE METABOLIC PANEL WITH GFR
ALT: 23 U/L (ref 0–44)
AST: 22 U/L (ref 15–41)
Albumin: 4.1 g/dL (ref 3.5–5.0)
Alkaline Phosphatase: 127 U/L — ABNORMAL HIGH (ref 38–126)
Anion gap: 15 (ref 5–15)
BUN: 19 mg/dL (ref 8–23)
CO2: 23 mmol/L (ref 22–32)
Calcium: 9.7 mg/dL (ref 8.9–10.3)
Chloride: 104 mmol/L (ref 98–111)
Creatinine, Ser: 0.8 mg/dL (ref 0.44–1.00)
GFR, Estimated: >60 mL/min (ref >60)
Glucose, Bld: 112 mg/dL — ABNORMAL HIGH (ref 70–99)
Potassium: 3.4 mmol/L — ABNORMAL LOW (ref 3.5–5.1)
Sodium: 142 mmol/L (ref 135–145)
Total Bilirubin: 0.3 mg/dL (ref 0.0–1.2)
Total Protein: 7.2 g/dL (ref 6.5–8.1)

## 2024-02-08 LAB — CBC WITH DIFFERENTIAL/PLATELET
Abs Immature Granulocytes: 0.03 K/uL (ref 0.00–0.07)
Basophils Absolute: 0.1 K/uL (ref 0.0–0.1)
Basophils Relative: 1 %
Eosinophils Absolute: 0.3 K/uL (ref 0.0–0.5)
Eosinophils Relative: 3 %
HCT: 42.5 % (ref 36.0–46.0)
Hemoglobin: 13.8 g/dL (ref 12.0–15.0)
Immature Granulocytes: 0 %
Lymphocytes Relative: 36 %
Lymphs Abs: 3.7 K/uL (ref 0.7–4.0)
MCH: 29.4 pg (ref 26.0–34.0)
MCHC: 32.5 g/dL (ref 30.0–36.0)
MCV: 90.6 fL (ref 80.0–100.0)
Monocytes Absolute: 0.8 K/uL (ref 0.1–1.0)
Monocytes Relative: 7 %
Neutro Abs: 5.3 K/uL (ref 1.7–7.7)
Neutrophils Relative %: 53 %
Platelets: 302 K/uL (ref 150–400)
RBC: 4.69 MIL/uL (ref 3.87–5.11)
RDW: 14.6 % (ref 11.5–15.5)
WBC: 10.1 K/uL (ref 4.0–10.5)
nRBC: 0 % (ref 0.0–0.2)

## 2024-02-08 LAB — MAGNESIUM: Magnesium: 2.1 mg/dL (ref 1.7–2.4)

## 2024-02-08 LAB — TSH: TSH: 0.529 u[IU]/mL (ref 0.350–4.500)

## 2024-02-08 MED ORDER — IPRATROPIUM-ALBUTEROL 0.5-2.5 (3) MG/3ML IN SOLN
3.0000 mL | Freq: Once | RESPIRATORY_TRACT | Status: AC
  Administered 2024-02-08: 3 mL via RESPIRATORY_TRACT
  Filled 2024-02-08: qty 3

## 2024-02-08 MED ORDER — OXYCODONE HCL 5 MG PO TABS
2.5000 mg | ORAL_TABLET | Freq: Once | ORAL | Status: AC
  Administered 2024-02-08: 2.5 mg via ORAL
  Filled 2024-02-08: qty 1

## 2024-02-08 MED ORDER — MECLIZINE HCL 25 MG PO TABS
25.0000 mg | ORAL_TABLET | Freq: Once | ORAL | Status: AC
  Administered 2024-02-08: 25 mg via ORAL
  Filled 2024-02-08: qty 1

## 2024-02-08 MED ORDER — ACETAMINOPHEN 500 MG PO TABS
1000.0000 mg | ORAL_TABLET | Freq: Once | ORAL | Status: AC
  Administered 2024-02-08: 1000 mg via ORAL
  Filled 2024-02-08: qty 2

## 2024-02-08 MED ORDER — MECLIZINE HCL 12.5 MG PO TABS
12.5000 mg | ORAL_TABLET | Freq: Three times a day (TID) | ORAL | 0 refills | Status: AC | PRN
  Filled 2024-02-08: qty 30, 10d supply, fill #0

## 2024-02-08 MED ORDER — PREDNISONE 10 MG PO TABS: 30.0000 mg | ORAL_TABLET | Freq: Every day | ORAL | 0 refills | Status: DC

## 2024-02-08 MED ORDER — SODIUM CHLORIDE 0.9 % IV BOLUS
1000.0000 mL | Freq: Once | INTRAVENOUS | Status: AC
  Administered 2024-02-08: 1000 mL via INTRAVENOUS

## 2024-02-08 MED ORDER — PREDNISONE 10 MG PO TABS
30.0000 mg | ORAL_TABLET | Freq: Every day | ORAL | 0 refills | Status: DC
  Filled 2024-02-08: qty 4, 1d supply, fill #0

## 2024-02-08 MED ORDER — MECLIZINE HCL 12.5 MG PO TABS: 12.5000 mg | ORAL_TABLET | Freq: Three times a day (TID) | ORAL | 0 refills | Status: DC | PRN

## 2024-02-08 NOTE — ED Triage Notes (Signed)
 Increased falls over the last few days after tripped over furniture. No HI or LOC in any of the falls. Ha started around the same time. Now having visual changes, nausea, and worsening headache. 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 Positional changes worsen the symptoms, could not drive herself.  Called Ems for SOB, feeling like her heart was racing. Has been using her inhaler repeatedly without relief. Ems gave 125mg  Solumedrol, 4mg  Zofran , 10 Albuterol ,  0.5 atrovent . CBG 98%

## 2024-02-08 NOTE — Progress Notes (Signed)
 CARDIOLOGY OFFICE NOTE  Date:  02/19/2024    Brandy Townsend Date of Birth: 04-May-1950 Medical Record #[id number]  PCP:  Nichole Senior, MD  Cardiologist: Delford    History of Present Illness: 73 y.o.  history of palpitations, atypical chest pain with prior normal Myoview , asthma, RA, hx thyroid  ablation, remote tobacco use, and +vaccines for Covid.   Recurrent chest pain  Cardiac CT 01/06/20 calcium  score 790 97 th percentile CAD RADS 3 possibly obstructive RCA/OM1/D1 FFR CT positive RCA/D1 Cath 01/08/20 [name] 70% OM` 60% RCA 55% Medical Rx Did not tolerate imdur  with headache    Quit smoking 2012 gained a lot of weight Sees [name] for asthma On Dulera  and albuterol  as needed   Seen in ED 05/14/20 with weakness and vertigo and vomiting after eating some rare hamburger Hydrated and d/c with meclizine  and Phenergan    Her husband is recovering from esophageal cancer  Has two grand children in [place]  9/16  that see her over summer and two here   BP drops with RA infusion  Verapamil  cut back to 120 mg daily 05/11/20   COVID 07/14/20 CXR NAD had 3 vaccines and got antibody titers in June Suspect they were checked due to immunosuppression with Rheumatrex   No angina despite trials and tribulations with husband  Monitor 08/26/21 with some self limited triggered atrial tachycardia Seen by Dr Waddell 09/15/21 and he only recommended watchful waiting no AAT or ablation TTE 08/09/21 EF 55-60% no significant valve dx  Tried to vacation at [facility] summer 2023 but husbands FT fell out and it was a bad experience trying to get it replaced at [facility] Eventually came back to [facility] and had IR do it after he lost 9 lbs   Seen in ED for cough and malaise 03/19/22 and was Influenza A positive by PCR  Husband stable actually taking some PO. Grand kids safe but are only 12 miles from [place]   Seen in ER multiple times for atypical chest pain r/o Cath 11/13/23 with no change from 2021  with 55% mid /distal RCA and 70% small ramus 60% mid OM1 Medical Rx . Echo 11/13/23 EF 60-65% normal RV no significant valve dx  Seen in ED 02/08/24 with falls, visual changes, headache , nausea and dyspnea. Using inhaler repeatedly Rx EMS with solumedrol , Zofran  , albuterol  and atrovent  Seems to have had vertigo  Seen by Geronimo told to stop Methotrexate. Primary decreased dose of verapamil  D dimer and BNP normal in office   Past Medical History:  Diagnosis Date   Asthma    CAD (coronary artery disease)    Diverticulosis    Graves disease    s/p RAI   High cholesterol    Hypertension    Hypothyroidism    Nephrolithiasis    Osteoporosis    Rheumatoid arthritis (HCC)    Vertigo     Past Surgical History:  Procedure Laterality Date   BRONCHIAL WASHINGS  09/12/2019   Procedure: BRONCHIAL WASHINGS;  Surgeon: Geronimo Amel, MD;  Location: [facility];  Service: Endoscopy;;   CORONARY PRESSURE/FFR STUDY N/A 01/08/2020   Procedure: INTRAVASCULAR PRESSURE WIRE/FFR STUDY;  Surgeon: Jordan, Talulah Schirmer M, MD;  Location: [facility] INVASIVE CV LAB;  Service: Cardiovascular;  Laterality: N/A;   INNER EAR SURGERY Right 02/27/2023   LEFT HEART CATH AND CORONARY ANGIOGRAPHY N/A 01/08/2020   Procedure: LEFT HEART CATH AND CORONARY ANGIOGRAPHY;  Surgeon: Jordan, Kellin Fifer M, MD;  Location: [facility] INVASIVE CV  LAB;  Service: Cardiovascular;  Laterality: N/A;   LEFT HEART CATH AND CORONARY ANGIOGRAPHY N/A 11/13/2023   Procedure: LEFT HEART CATH AND CORONARY ANGIOGRAPHY;  Surgeon: Mady Bruckner, MD;  Location: [facility] INVASIVE CV LAB;  Service: Cardiovascular;  Laterality: N/A;   TYMPANOPLASTY Right 02/27/2023   Procedure: TYMPANOPLASTY;  Surgeon: Jesus Oliphant, MD;  Location: [facility];  Service: ENT;  Laterality: Right;   VIDEO BRONCHOSCOPY N/A 09/12/2019   Procedure: VIDEO BRONCHOSCOPY WITHOUT FLUORO;  Surgeon: Geronimo Amel, MD;  Location: [facility];  Service: Endoscopy;  Laterality: N/A;      Medications: Current Meds  Medication Sig   abatacept  (ORENCIA ) 250 MG injection Inject 250 mg into the vein See admin instructions. Inject 250 mg IV once every month.   albuterol  (VENTOLIN  HFA) 108 (90 Base) MCG/ACT inhaler Inhale 1-2 puffs into the lungs every 6 (six) hours as needed for wheezing or shortness of breath.   aspirin  EC 81 MG tablet Take 1 tablet (81 mg total) by mouth daily. Swallow whole.   budesonide -glycopyrrolate -formoterol  (BREZTRI  AEROSPHERE) 160-9-4.8 MCG/ACT AERO inhaler Inhale 2 puffs into the lungs in the morning and at bedtime.   cetirizine (ZYRTEC) 10 MG tablet Take 10 mg by mouth daily.   diclofenac  Sodium (VOLTAREN ) 1 % GEL Apply 4 g topically 4 (four) times daily.   folic acid (FOLVITE) 1 MG tablet 1 tablet Orally Once a day; Duration: 90 days   levothyroxine  (SYNTHROID ) 75 MCG tablet Take 1 tablet (75 mcg total) by mouth every morning on an empty stomach   meclizine  (ANTIVERT ) 12.5 MG tablet Take 1 tablet (12.5 mg total) by mouth 3 (three) times daily as needed for dizziness.   montelukast  (SINGULAIR ) 10 MG tablet Take 1 tablet (10 mg total) by mouth daily at night   Multiple Vitamins-Minerals (EQ MULTIVITAMINS ADULT GUMMY PO) Take 1 each by mouth daily.   nitroGLYCERIN  (NITROSTAT ) 0.4 MG SL tablet Place 1 tablet (0.4 mg total) under the tongue every 5 (five) minutes as needed for chest pain.   potassium chloride  (KLOR-CON ) 10 MEQ tablet Take 1 tablet (10 mEq total) by mouth daily.   rosuvastatin  (CRESTOR ) 10 MG tablet Take 1 tablet (10 mg total) by mouth daily.   torsemide  (DEMADEX ) 20 MG tablet Take 1 tablet (20 mg total) by mouth in the morning AND ONE-HALF tablet (10 mg total) every evening.   Turmeric (QC TUMERIC COMPLEX PO) Take 2 tablets by mouth daily.   verapamil  (CALAN -SR) 120 MG CR tablet TAKE 1 TABLET (120 MG TOTAL) BY MOUTH DAILY AS NEEDED. (Patient taking differently: Take 80 mg by mouth daily.)   Vitamin D, Ergocalciferol, (DRISDOL) 1.25 MG  (50000 UNIT) CAPS capsule Take 50,000 Units by mouth every 7 (seven) days. Monday     Allergies: Allergies  Allergen Reactions   Cat Dander     Other reaction(s): congestion   Dust Mite Extract     Other reaction(s): Unknown   Macrobid [Nitrofurantoin Monohyd Macro] Other (See Comments)    Lowered potassium, caused aches and pains   Morphine  And Codeine Nausea And Vomiting    Social History: The patient  reports that she quit smoking about 15 years ago. Her smoking use included cigarettes. She started smoking about 35 years ago. She has a 20 pack-year smoking history. She has never used smokeless tobacco. She reports that she does not drink alcohol and does not use drugs.   Family History: The patient's family history includes Breast cancer in her mother; Cancer (age of  onset: 79) in her daughter; Heart disease in her father. FH + for CAD.   Review of Systems: Please see the history of present illness.   All other systems are reviewed and negative.   Physical Exam: VS:  BP 100/72   Pulse 69   Ht 5' 3 (1.6 m)   Wt 152 lb (68.9 kg)   SpO2 96%   BMI 26.93 kg/m  .  BMI Body mass index is 26.93 kg/m.  Wt Readings from Last 3 Encounters:  02/19/24 152 lb (68.9 kg)  02/13/24 150 lb 12.8 oz (68.4 kg)  01/09/24 148 lb 6.4 oz (67.3 kg)   Affect appropriate Healthy:  appears stated age HEENT: normal Neck supple with no adenopathy JVP normal no bruits no thyromegaly Lungs clear with no wheezing and good diaphragmatic motion Heart:  S1/S2 no murmur, no rub, gallop or click PMI normal Abdomen: benighn, BS positve, no tenderness, no AAA no bruit.  No HSM or HJR Distal pulses intact with no bruits No edema Neuro non-focal Skin warm and dry No muscular weakness .   LABORATORY DATA:  EKG:   2/17 SR ? Limb lead reversal normal ST segments   Lab Results  Component Value Date   WBC 10.1 02/08/2024   HGB 13.8 02/08/2024   HCT 42.5 02/08/2024   PLT 302 02/08/2024    GLUCOSE 142 (H) 02/13/2024   CHOL 121 11/13/2023   TRIG 43 11/13/2023   HDL 65 11/13/2023   LDLCALC 47 11/13/2023   ALT 23 02/08/2024   AST 22 02/08/2024   NA 141 02/13/2024   K 3.8 02/13/2024   CL 103 02/13/2024   CREATININE 0.93 02/13/2024   BUN 19 02/13/2024   CO2 29 02/13/2024   TSH 0.529 02/08/2024     BNP (last 3 results) No results for input(s): BNP in the last 8760 hours.  ProBNP (last 3 results) Recent Labs    02/13/24 1512  PROBNP 26.0     Other Studies Reviewed Today:  Cath 11/13/23: Conclusions: Mild-moderate multivessel coronary artery disease, as detailed below, similar to last catheterization in 2021.  DFR of 50-60% RCA stenosis at that time was normal. Normal left ventricular systolic function (LVEF 55-65%) with normal filling pressure (LVEDP 15 mmHg).   Recommendations: Continue medical therapy and risk factor modification to prevent progression of coronary artery disease. Follow-up echocardiogram.   Lonni Hanson, MD [facility]  Echo 11/13/23   1. Left ventricular ejection fraction, by estimation, is 60 to 65%. The  left ventricle has normal function. The left ventricle has no regional  wall motion abnormalities. Left ventricular diastolic parameters were  normal. The average left ventricular  global longitudinal strain is -19.2 %. The global longitudinal strain is  normal.   2. Right ventricular systolic function is normal. The right ventricular  size is normal. There is normal pulmonary artery systolic pressure.   3. The mitral valve is abnormal. No evidence of mitral valve  regurgitation. No evidence of mitral stenosis.   4. The aortic valve is tricuspid. Aortic valve regurgitation is not  visualized. No aortic stenosis is present.   5. The inferior vena cava is normal in size with greater than 50%  respiratory variability, suggesting right atrial pressure of 3 mmHg.    ECG:  02/19/2024 NSR normal ECG      ASSESSMENT & PLAN:      1. CAD - Cath 11/13/23 no change from 2021 with moderate mid/distal RCA stenosis, 70% small IM and 60% OM1.  She is on statin Did not tolerate nitrates  Fatigue and significant asthma not on beta blocker continue ASA and verapamil    2. HTN - seems to be running lower decrease verapamil  to 120 mg daily .   3. HLD - on statin tolerating 10 mg Crestor  LDL 48   4. RA - followed by Rheumatology on Methotrexate and Orencia .  Folic acid added to help with hair thinning   5. Pulmonary : f/u [name] asthma, COPD Gold 2  Continue Breztri , ventolin  and singulair . BNP / D dimer negative Methotrexate DC/  6. Situational stress - not on Rx f/u primary   7. Thyroid :  On synthroid  TSH normal   8. COVID:  07/10/20 has had 3 vaccines good antibody titers 09/15/20 improved CXR with NAD Influenza A 02/2022 CXR NAD Rx with Tamiflu   9. Palpitations:  benign self limited atrial tachycardia on monitor Seen by EP Dr Waddell no ablation or AAT recommended Not on beta blocker with asthma and lung dx Continue verapamil     F/U in 6 months     Signed: Maude Emmer, MD  02/19/2024 8:00 AM  [facility] [address] [place], [place]  [address] Phone: [phone] Fax: [phone]

## 2024-02-08 NOTE — ED Provider Notes (Signed)
 Patient requested change in pharmacy.   Garrick Charleston, MD 02/08/24 [phone]

## 2024-02-08 NOTE — ED Provider Notes (Signed)
 [facility] AT [facility] Provider Note   CSN: [id number] Arrival date & time: 02/08/24  1330     Patient presents with: Shortness of Breath   Brandy Townsend is a 73 y.o. female.    Shortness of Breath   Presents with multiple complaints.  Including vertigo, headache, shortness of breath.  In terms of the headache and vertigo, feels like this, over the past 3 to 4 days.  Happened around same time as she fell.  Patient states that during this time, she will have worsening positional vertigo whenever she moves her head back-and-forth.  No diplopia.  Noted some blurriness of vision whenever she was feeling vertiginous earlier.  But no diplopia.  No vision field cuts.  No numbness or tinging anywhere.  No weakness.  Endorses some pain in the base of her neck since the fall.  Once again, no numbness or tingling anywhere.  No fever no chills.  Patient states that she has a history of asthma.  Used to smoke but no longer smoking quit about 14-15 years ago.  Patient states that she was told by EMS that her lung sounds were diminished and so was given medication and route.  Currently, does not experience any kind of shortness of breath.  No chest pain.  No pleuritic chest pain or hemoptysis.  No exertional chest pain here recently.    Per EMS, patient was given home 25 mg Solu-Medrol , 4 mg Zofran , 10 of albuterol  and 0.5 Atrovent .  Previous medical history reviewed : Patient last discharged in August 2025.  Chest pain.  Concern for possible unstable angina.  Multivessel coronary artery disease history.  Negative delta x 3.  Cardiology performed a heart catheterization on August 18.  Revealing mild to moderate multivessel coronary artery disease.  Cards recommended continue aspirin  as well as Crestor  on discharge.  Echo normal EF.   Prior to Admission medications   Medication Sig Start Date End Date Taking? Authorizing Provider  meclizine  (ANTIVERT ) 12.5 MG  tablet Take 1 tablet (12.5 mg total) by mouth 3 (three) times daily as needed for dizziness. 02/08/24  Yes Simon Lavonia SAILOR, MD  predniSONE  (DELTASONE ) 10 MG tablet Take 3 tablets (30 mg total) by mouth daily. 02/08/24  Yes Simon Lavonia SAILOR, MD  abatacept  (ORENCIA ) 250 MG injection Inject 250 mg into the vein See admin instructions. Inject 250 mg IV once every month.    [provider]  albuterol  (VENTOLIN  HFA) 108 (90 Base) MCG/ACT inhaler Inhale 1-2 puffs into the lungs every 6 (six) hours as needed for wheezing or shortness of breath. 12/02/21   Parrett, Madelin RAMAN, NP  aspirin  EC 81 MG tablet Take 1 tablet (81 mg total) by mouth daily. Swallow whole. 12/17/19   Army Katheryn BROCKS, NP  budeson-glycopyrrolate -formoterol  (BREZTRI  AEROSPHERE) 160-9-4.8 MCG/ACT AERO Inhale 2 puffs into the lungs in the morning and at bedtime. 06/29/23   Geronimo Amel, MD  budesonide -glycopyrrolate -formoterol  (BREZTRI  AEROSPHERE) 160-9-4.8 MCG/ACT AERO inhaler Inhale 2 puffs into the lungs in the morning and at bedtime. 01/09/24   Geronimo Amel, MD  cetirizine (ZYRTEC) 10 MG tablet Take 10 mg by mouth daily.    [provider]  diclofenac  Sodium (VOLTAREN ) 1 % GEL Apply 4 g topically 4 (four) times daily. 11/12/22   Palumbo, April, MD  folic acid (FOLVITE) 1 MG tablet 1 tablet Orally Once a day; Duration: 90 days 12/12/23   [provider]  levothyroxine  (SYNTHROID ) 75 MCG tablet Take 1 tablet (  75 mcg total) by mouth every morning on an empty stomach 05/04/23     methotrexate (RHEUMATREX) 2.5 MG tablet Take 10 mg by mouth once a week. 12/12/23   [provider]  montelukast  (SINGULAIR ) 10 MG tablet Take 1 tablet (10 mg total) by mouth daily at night 04/14/23     Multiple Vitamins-Minerals (EQ MULTIVITAMINS ADULT GUMMY PO) Take 1 each by mouth daily.    [provider]  nitroGLYCERIN  (NITROSTAT ) 0.4 MG SL tablet Place 1 tablet (0.4 mg total) under the tongue every 5 (five) minutes as  needed for chest pain. 07/25/23   Wyn Jackee VEAR Mickey., NP  potassium chloride  (KLOR-CON ) 10 MEQ tablet Take 1 tablet (10 mEq total) by mouth daily. 04/14/23     rosuvastatin  (CRESTOR ) 10 MG tablet Take 1 tablet (10 mg total) by mouth daily. 09/25/23   Delford Maude BROCKS, MD  torsemide  (DEMADEX ) 20 MG tablet Take 1 tablet (20 mg total) by mouth in the morning AND ONE-HALF tablet (10 mg total) every evening. 06/19/23     Turmeric (QC TUMERIC COMPLEX PO) Take 2 tablets by mouth daily.    [provider]  verapamil  (CALAN -SR) 120 MG CR tablet TAKE 1 TABLET (120 MG TOTAL) BY MOUTH DAILY AS NEEDED. Patient taking differently: Take 80 mg by mouth daily. 08/25/23   Wyn Jackee VEAR Mickey., NP  Vitamin D, Ergocalciferol, (DRISDOL) 1.25 MG (50000 UNIT) CAPS capsule Take 50,000 Units by mouth every 7 (seven) days. Monday    [provider]    Allergies: Cat dander, Dust mite extract, Macrobid [nitrofurantoin monohyd macro], and Morphine  and codeine    Review of Systems  Respiratory:  Positive for shortness of breath.     Updated Vital Signs BP (!) 114/59 (BP Location: Right Arm)   Pulse 88   Resp 16   SpO2 95%   Physical Exam Vitals and nursing note reviewed.  Constitutional:      General: She is not in acute distress.    Appearance: She is well-developed.  HENT:     Head: Normocephalic and atraumatic.  Eyes:     Conjunctiva/sclera: Conjunctivae normal.  Cardiovascular:     Rate and Rhythm: Normal rate and regular rhythm.     Heart sounds: No murmur heard. Pulmonary:     Effort: Pulmonary effort is normal. No respiratory distress.     Breath sounds: Normal breath sounds.  Abdominal:     Palpations: Abdomen is soft.     Tenderness: There is no abdominal tenderness.  Musculoskeletal:        General: No swelling.     Cervical back: Neck supple.  Skin:    General: Skin is warm and dry.     Capillary Refill: Capillary refill takes less than 2 seconds.  Neurological:     Mental  Status: She is alert.  Psychiatric:        Mood and Affect: Mood normal.     (all labs ordered are listed, but only abnormal results are displayed) Labs Reviewed  COMPREHENSIVE METABOLIC PANEL WITH GFR - Abnormal; Notable for the following components:      Result Value   Potassium 3.4 (*)    Glucose, Bld 112 (*)    Alkaline Phosphatase 127 (*)    All other components within normal limits  CBC WITH DIFFERENTIAL/PLATELET  TSH  MAGNESIUM    EKG: None  Radiology: DG Chest Portable 1 View Result Date: 02/08/2024 CLINICAL DATA:  Evaluate for pneumonia EXAM: PORTABLE CHEST 1  VIEW COMPARISON:  Chest x-ray 11/12/2023 FINDINGS: The heart size and mediastinal contours are within normal limits. Both lungs are clear. The visualized skeletal structures are unremarkable. IMPRESSION: No active disease. Electronically Signed   By: Greig Pique M.D.   On: 02/08/2024 15:20   CT Cervical Spine Wo Contrast Result Date: 02/08/2024 EXAM: CT CERVICAL SPINE WITHOUT CONTRAST 10/05/2023 TECHNIQUE: CT of the cervical spine was performed without the administration of intravenous contrast. Multiplanar reformatted images are provided for review. Automated exposure control, iterative reconstruction, and/or weight based adjustment of the mA/kV was utilized to reduce the radiation dose to as low as reasonably achievable. COMPARISON: None available. CLINICAL HISTORY: blunt trauma FINDINGS: CERVICAL SPINE: BONES AND ALIGNMENT: Slight reversal of the normal cervical lordosis. Similar trace anterolisthesis of C4 on C5 related to degenerative changes. No evidence of traumatic malalignment. No acute fracture. DEGENERATIVE CHANGES: Severe disc space narrowing at C5-C6 and mild to moderate disc space narrowing at C6-C7. Degenerative arthrosis at multiple levels. No uncovertebral hypertrophy at multiple levels. No high grade osseous spinal canal stenosis. SOFT TISSUES: No prevertebral soft tissue swelling. IMPRESSION: 1. No  acute abnormality of the cervical spine related to blunt trauma. 2. Degenerative changes as above. Electronically signed by: Donnice Mania MD 02/08/2024 02:56 PM EST RP Workstation: [id number]   CT Head Wo Contrast Result Date: 02/08/2024 EXAM: CT HEAD WITHOUT CONTRAST 02/08/2024 02:24:05 PM TECHNIQUE: CT of the head was performed without the administration of intravenous contrast. Automated exposure control, iterative reconstruction, and/or weight based adjustment of the mA/kV was utilized to reduce the radiation dose to as low as reasonably achievable. COMPARISON: 10/05/2023 CLINICAL HISTORY: blunt trauma FINDINGS: BRAIN AND VENTRICLES: No acute hemorrhage. No evidence of acute infarct. No hydrocephalus. No extra-axial collection. No mass effect or midline shift. Atherosclerosis of the carotid siphons. ORBITS: No acute abnormality. SINUSES: Paranasal sinus mucosal thickening and air-fluid levels. SOFT TISSUES AND SKULL: No acute soft tissue abnormality. No skull fracture. IMPRESSION: 1. No acute intracranial abnormality. 2. Paranasal sinus mucosal thickening and air-fluid levels, concerning for acute sinusitis. Electronically signed by: Donnice Mania MD 02/08/2024 02:51 PM EST RP Workstation: [id number]     Procedures   Medications Ordered in the ED  sodium chloride  0.9 % bolus 1,000 mL (0 mLs Intravenous Stopped 02/08/24 1522)  meclizine  (ANTIVERT ) tablet 25 mg (25 mg Oral Given 02/08/24 1436)  acetaminophen  (TYLENOL ) tablet 1,000 mg (1,000 mg Oral Given 02/08/24 1436)  oxyCODONE  (Oxy IR/ROXICODONE ) immediate release tablet 2.5 mg (2.5 mg Oral Given 02/08/24 1521)  ipratropium-albuterol  (DUONEB) 0.5-2.5 (3) MG/3ML nebulizer solution 3 mL (3 mLs Nebulization Given 02/08/24 1642)                                    Medical Decision Making Amount and/or Complexity of Data Reviewed Labs: ordered. Radiology: ordered.  Risk OTC drugs. Prescription drug management.     HPI:    Presents  with multiple complaints.  Including vertigo, headache, shortness of breath.  In terms of the headache and vertigo, feels like this, over the past 3 to 4 days.  Happened around same time as she fell.  Patient states that during this time, she will have worsening positional vertigo whenever she moves her head back-and-forth.  No diplopia.  Noted some blurriness of vision whenever she was feeling vertiginous earlier.  But no diplopia.  No vision field cuts.  No numbness or tinging anywhere.  No weakness.  Endorses some pain in the base of her neck since the fall.  Once again, no numbness or tingling anywhere.  No fever no chills.  Patient states that she has a history of asthma.  Used to smoke but no longer smoking quit about 14-15 years ago.  Patient states that she was told by EMS that her lung sounds were diminished and so was given medication and route.  Currently, does not experience any kind of shortness of breath.  No chest pain.  No pleuritic chest pain or hemoptysis.  No exertional chest pain here recently.    Per EMS, patient was given home 25 mg Solu-Medrol , 4 mg Zofran , 10 of albuterol  and 0.5 Atrovent .  Previous medical history reviewed : Patient last discharged in August 2025.  Chest pain.  Concern for possible unstable angina.  Multivessel coronary artery disease history.  Negative delta x 3.  Cardiology performed a heart catheterization on August 18.  Revealing mild to moderate multivessel coronary artery disease.  Cards recommended continue aspirin  as well as Crestor  on discharge.  Echo normal EF.   MDM:   Upon exam, patient hemodynamically stable.  She was on 2 L nasal cannula O2 saturation 100%.  Subsequently turned to the oxygen saturation off and O2 saturation remained high 90s to 100%.  No tachypnea or tachycardia.  In terms of neurologic exam, patient ANO x 3 with GCS 15.  NIH is 0.  Normal finger-nose.  Patient has some slight horizontal nystagmus.  Slight horizontal corrective  saccade but negative test of skew.  No rotary or vertical nystagmus of concern.  Was able to reproduce patient's symptoms with movement of her head in terms of the vertigo.  No concerns for CVA.  Will obtain CT head as well as CT C-spine given the fall.  Rule out subdural epidural.  Rule out cervical fracture.  No concern for any kind of vertebral basilar insufficiency at this time.  Indication for vessel imaging.  Lung sounds somewhat diminished bilaterally.  Will continue to monitor.  Will give DuoNebs as needed here in the ED.  Obtain chest x-ray make sure there is no infiltrate or pneumothorax though I think this is low concern.   No chest pain.  No concern for ACS workup.  No DVT or PE risk factors in this point time.   Reevaluation:   Upon reexamination, patient hemodynamically stable.  Remains A&O x 3 with GCS 15.   Patient remain on room air.  O2 saturation well above 90%.  She does have some decreased breath sounds bilaterally.  Chest x-ray benign.  Likely asthma exacerbation given her history.  Patient already received Solu-Medrol .  Will give 1 DuoNeb here.  She is able to walk around the ED without any acute distress without desaturation.  Started patient on prednisone .  Patient will follow-up with PCP and make sure that she improves.  Strict return precautions.  In terms of the vertigo.  CT head benign.  CT cervical spine benign from the fall.  No concerns for CVA.  Remains neuro intact.  Able to walk around in steady manner without difficulty.  As above, seems peripheral based off my exam.  No indication to obtain MRI brain given she is able to walk around a steady manner as well as neuro intact with Romberg negative, normal finger-nose, horizontal saccade, horizontal nystagmus, no rotary or vertical nystagmus, normal test of skew.  Will take meclizine  as needed.  No chest pain throughout today's visit.  No chest pain with  exertion recently.  No indication to obtain ACS  workup    Interventions: duoneb,  EKG Interpreted by Me: sinus    Cardiac Tele Interpreted by Me: sinus   I have independently interpreted the CXR  and CT  images and agree with the radiologist finding     Disposition and Follow Up: pcp       Final diagnoses:  Mild intermittent asthma with exacerbation  Vertigo    ED Discharge Orders          Ordered    predniSONE  (DELTASONE ) 10 MG tablet  Daily        02/08/24 1639    meclizine  (ANTIVERT ) 12.5 MG tablet  3 times daily PRN        02/08/24 1639               Simon Lavonia SAILOR, MD 02/08/24 1655

## 2024-02-08 NOTE — Discharge Instructions (Addendum)
 Please take the prednisone  as prescribed.  Have any kind of worsening shortness of breath or experiencing any chest pain, please come back to the ED.  You have a count of dizziness especially with double vision or numbness or tingling or difficulty speaking then please come back to the ED for further evaluation.  I do think you have benign personal vertigo.  You can take the meclizine  if you are symptomatic.  Do not take the meclizine  and drive.  Please follow-up with your PCP.

## 2024-02-09 ENCOUNTER — Other Ambulatory Visit: Payer: Self-pay

## 2024-02-09 ENCOUNTER — Ambulatory Visit: Payer: Self-pay | Admitting: Internal Medicine

## 2024-02-09 ENCOUNTER — Encounter (HOSPITAL_BASED_OUTPATIENT_CLINIC_OR_DEPARTMENT_OTHER): Payer: Self-pay

## 2024-02-09 ENCOUNTER — Emergency Department (HOSPITAL_BASED_OUTPATIENT_CLINIC_OR_DEPARTMENT_OTHER)
Admission: EM | Admit: 2024-02-09 | Discharge: 2024-02-09 | Disposition: A | Discharge status: Home / Self Care | Attending: Emergency Medicine | Admitting: Emergency Medicine

## 2024-02-09 DIAGNOSIS — R0602 Shortness of breath: Secondary | ICD-10-CM

## 2024-02-09 DIAGNOSIS — J45901 Unspecified asthma with (acute) exacerbation: Secondary | ICD-10-CM | POA: Insufficient documentation

## 2024-02-09 DIAGNOSIS — Z7982 Long term (current) use of aspirin: Secondary | ICD-10-CM | POA: Insufficient documentation

## 2024-02-09 DIAGNOSIS — Z79899 Other long term (current) drug therapy: Secondary | ICD-10-CM | POA: Insufficient documentation

## 2024-02-09 DIAGNOSIS — I10 Essential (primary) hypertension: Secondary | ICD-10-CM | POA: Insufficient documentation

## 2024-02-09 DIAGNOSIS — I2510 Atherosclerotic heart disease of native coronary artery without angina pectoris: Secondary | ICD-10-CM | POA: Diagnosis not present

## 2024-02-09 DIAGNOSIS — Z7952 Long term (current) use of systemic steroids: Secondary | ICD-10-CM | POA: Diagnosis not present

## 2024-02-09 DIAGNOSIS — J45909 Unspecified asthma, uncomplicated: Secondary | ICD-10-CM | POA: Diagnosis not present

## 2024-02-09 LAB — RESP PANEL BY RT-PCR (RSV, FLU A&B, COVID)  RVPGX2
Influenza A by PCR: NEGATIVE
Influenza B by PCR: NEGATIVE
Resp Syncytial Virus by PCR: NEGATIVE
SARS Coronavirus 2 by RT PCR: NEGATIVE

## 2024-02-09 LAB — TROPONIN T, HIGH SENSITIVITY: Troponin T High Sensitivity: <15 ng/L (ref 0–19)

## 2024-02-09 MED ORDER — IPRATROPIUM-ALBUTEROL 0.5-2.5 (3) MG/3ML IN SOLN
3.0000 mL | Freq: Once | RESPIRATORY_TRACT | Status: AC
  Administered 2024-02-09: 3 mL via RESPIRATORY_TRACT
  Filled 2024-02-09: qty 3

## 2024-02-09 MED ORDER — ALBUTEROL SULFATE HFA 108 (90 BASE) MCG/ACT IN AERS
2.0000 | INHALATION_SPRAY | Freq: Once | RESPIRATORY_TRACT | Status: DC
  Filled 2024-02-09: qty 6.7

## 2024-02-09 MED ORDER — METHYLPREDNISOLONE SODIUM SUCC 125 MG IJ SOLR
125.0000 mg | INTRAMUSCULAR | Status: AC
  Administered 2024-02-09: 125 mg via INTRAVENOUS
  Filled 2024-02-09: qty 2

## 2024-02-09 MED ORDER — PREDNISONE 50 MG PO TABS: 60.0000 mg | ORAL_TABLET | Freq: Once | ORAL | Status: DC

## 2024-02-09 NOTE — ED Notes (Signed)
 RT ambulated with pt with pulse ox. Pt's O2 saturation remained 96-98% during the walk with her HR in the 80's. Pt seemed to tolerate the walk well.

## 2024-02-09 NOTE — ED Provider Notes (Signed)
 [place] EMERGENCY DEPARTMENT AT [facility] Provider Note   CSN: [id number] Arrival date & time: 02/09/24  9070     Patient presents with: Shortness of Breath   Brandy Townsend is a 73 y.o. female.   73 year old female with a history of rheumatoid arthritis, CAD, asthma, hypertension, and hyperlipidemia who presents to the emergency department with shortness of breath.  Patient reports that over the past 3 to 4 days she has felt generally weak and tired.  Also had a headache and felt dizzy and had some mild shortness of breath.  Went to the emergency department yesterday and had chest x-ray, CBC, CMP, head CT that were all normal.  Chest x-ray without acute abnormality.  Was diagnosed with an asthma exacerbation was given steroids and nebulizer treatments there.  Says that since going home she has been checking her pulse ox and it dipped to 89 today and she decided to come in.  No cough.  No chest pain.  No leg swelling or calf pain.  No history of DVT or PE.  No history of cancer.  No history of intubations for asthma.  Has not yet picked up her prednisone  and says that she lost her rescue inhaler while being transported to the emergency department yesterday       Prior to Admission medications   Medication Sig Start Date End Date Taking? Authorizing Provider  abatacept  (ORENCIA ) 250 MG injection Inject 250 mg into the vein See admin instructions. Inject 250 mg IV once every month.    [provider]  albuterol  (VENTOLIN  HFA) 108 (90 Base) MCG/ACT inhaler Inhale 1-2 puffs into the lungs every 6 (six) hours as needed for wheezing or shortness of breath. 12/02/21   Parrett, Madelin RAMAN, NP  aspirin  EC 81 MG tablet Take 1 tablet (81 mg total) by mouth daily. Swallow whole. 12/17/19   Army Katheryn BROCKS, NP  budeson-glycopyrrolate -formoterol  (BREZTRI  AEROSPHERE) 160-9-4.8 MCG/ACT AERO Inhale 2 puffs into the lungs in the morning and at bedtime. 06/29/23   Geronimo Amel, MD   budesonide -glycopyrrolate -formoterol  (BREZTRI  AEROSPHERE) 160-9-4.8 MCG/ACT AERO inhaler Inhale 2 puffs into the lungs in the morning and at bedtime. 01/09/24   Geronimo Amel, MD  cetirizine (ZYRTEC) 10 MG tablet Take 10 mg by mouth daily.    [provider]  diclofenac  Sodium (VOLTAREN ) 1 % GEL Apply 4 g topically 4 (four) times daily. 11/12/22   Palumbo, April, MD  folic acid (FOLVITE) 1 MG tablet 1 tablet Orally Once a day; Duration: 90 days 12/12/23   [provider]  levothyroxine  (SYNTHROID ) 75 MCG tablet Take 1 tablet (75 mcg total) by mouth every morning on an empty stomach 05/04/23     meclizine  (ANTIVERT ) 12.5 MG tablet Take 1 tablet (12.5 mg total) by mouth 3 (three) times daily as needed for dizziness. 02/08/24   Garrick Charleston, MD  methotrexate (RHEUMATREX) 2.5 MG tablet Take 10 mg by mouth once a week. 12/12/23   [provider]  montelukast  (SINGULAIR ) 10 MG tablet Take 1 tablet (10 mg total) by mouth daily at night 04/14/23     Multiple Vitamins-Minerals (EQ MULTIVITAMINS ADULT GUMMY PO) Take 1 each by mouth daily.    [provider]  nitroGLYCERIN  (NITROSTAT ) 0.4 MG SL tablet Place 1 tablet (0.4 mg total) under the tongue every 5 (five) minutes as needed for chest pain. 07/25/23   Wyn Jackee VEAR Mickey., NP  potassium chloride  (KLOR-CON ) 10 MEQ tablet Take 1 tablet (10 mEq total) by mouth  daily. 04/14/23     predniSONE  (DELTASONE ) 10 MG tablet Take 3 tablets (30 mg total) by mouth daily. 02/08/24   Garrick Charleston, MD  rosuvastatin  (CRESTOR ) 10 MG tablet Take 1 tablet (10 mg total) by mouth daily. 09/25/23   Nishan, Peter C, MD  torsemide  (DEMADEX ) 20 MG tablet Take 1 tablet (20 mg total) by mouth in the morning AND ONE-HALF tablet (10 mg total) every evening. 06/19/23     Turmeric (QC TUMERIC COMPLEX PO) Take 2 tablets by mouth daily.    [provider]  verapamil  (CALAN -SR) 120 MG CR tablet TAKE 1 TABLET (120 MG TOTAL) BY MOUTH DAILY AS  NEEDED. Patient taking differently: Take 80 mg by mouth daily. 08/25/23   Wyn Jackee VEAR Mickey., NP  Vitamin D, Ergocalciferol, (DRISDOL) 1.25 MG (50000 UNIT) CAPS capsule Take 50,000 Units by mouth every 7 (seven) days. Monday    [provider]    Allergies: Cat dander, Dust mite extract, Macrobid [nitrofurantoin monohyd macro], and Morphine  and codeine    Review of Systems  Updated Vital Signs BP 107/62   Pulse 66   Temp 97.9 F (36.6 C)   Resp 16   SpO2 97%   Physical Exam Vitals and nursing note reviewed.  Constitutional:      General: She is not in acute distress.    Appearance: She is well-developed.  HENT:     Head: Normocephalic and atraumatic.     Right Ear: External ear normal.     Left Ear: External ear normal.     Nose: Nose normal.  Eyes:     Extraocular Movements: Extraocular movements intact.     Conjunctiva/sclera: Conjunctivae normal.     Pupils: Pupils are equal, round, and reactive to light.  Cardiovascular:     Rate and Rhythm: Normal rate and regular rhythm.     Heart sounds: No murmur heard. Pulmonary:     Effort: Pulmonary effort is normal. No respiratory distress.     Breath sounds: Normal breath sounds.  Musculoskeletal:     Cervical back: Normal range of motion and neck supple.     Right lower leg: No edema.     Left lower leg: No edema.  Skin:    General: Skin is warm and dry.  Neurological:     Mental Status: She is alert and oriented to person, place, and time. Mental status is at baseline.  Psychiatric:        Mood and Affect: Mood normal.     (all labs ordered are listed, but only abnormal results are displayed) Labs Reviewed  RESP PANEL BY RT-PCR (RSV, FLU A&B, COVID)  [id number]  TROPONIN T, HIGH SENSITIVITY    EKG: EKG Interpretation Date/Time:  Friday February 09 2024 09:37:13 EST Ventricular Rate:  78 PR Interval:  132 QRS Duration:  74 QT Interval:  370 QTC Calculation: 421 R Axis:   49  Text  Interpretation: Normal sinus rhythm Nonspecific T wave abnormality Abnormal ECG When compared with ECG of 12-Nov-2023 03:54, PREVIOUS ECG IS PRESENT Confirmed by Yolande Charleston [phone]) on 02/09/2024 10:16:03 AM  Radiology: DG Chest Portable 1 View Result Date: 02/08/2024 CLINICAL DATA:  Evaluate for pneumonia EXAM: PORTABLE CHEST 1 VIEW COMPARISON:  Chest x-ray 11/12/2023 FINDINGS: The heart size and mediastinal contours are within normal limits. Both lungs are clear. The visualized skeletal structures are unremarkable. IMPRESSION: No active disease. Electronically Signed   By: Greig Pique M.D.   On: 02/08/2024 15:20   CT  Cervical Spine Wo Contrast Result Date: 02/08/2024 EXAM: CT CERVICAL SPINE WITHOUT CONTRAST 10/05/2023 TECHNIQUE: CT of the cervical spine was performed without the administration of intravenous contrast. Multiplanar reformatted images are provided for review. Automated exposure control, iterative reconstruction, and/or weight based adjustment of the mA/kV was utilized to reduce the radiation dose to as low as reasonably achievable. COMPARISON: None available. CLINICAL HISTORY: blunt trauma FINDINGS: CERVICAL SPINE: BONES AND ALIGNMENT: Slight reversal of the normal cervical lordosis. Similar trace anterolisthesis of C4 on C5 related to degenerative changes. No evidence of traumatic malalignment. No acute fracture. DEGENERATIVE CHANGES: Severe disc space narrowing at C5-C6 and mild to moderate disc space narrowing at C6-C7. Degenerative arthrosis at multiple levels. No uncovertebral hypertrophy at multiple levels. No high grade osseous spinal canal stenosis. SOFT TISSUES: No prevertebral soft tissue swelling. IMPRESSION: 1. No acute abnormality of the cervical spine related to blunt trauma. 2. Degenerative changes as above. Electronically signed by: Donnice Mania MD 02/08/2024 02:56 PM EST RP Workstation: [id number]   CT Head Wo Contrast Result Date: 02/08/2024 EXAM: CT HEAD WITHOUT  CONTRAST 02/08/2024 02:24:05 PM TECHNIQUE: CT of the head was performed without the administration of intravenous contrast. Automated exposure control, iterative reconstruction, and/or weight based adjustment of the mA/kV was utilized to reduce the radiation dose to as low as reasonably achievable. COMPARISON: 10/05/2023 CLINICAL HISTORY: blunt trauma FINDINGS: BRAIN AND VENTRICLES: No acute hemorrhage. No evidence of acute infarct. No hydrocephalus. No extra-axial collection. No mass effect or midline shift. Atherosclerosis of the carotid siphons. ORBITS: No acute abnormality. SINUSES: Paranasal sinus mucosal thickening and air-fluid levels. SOFT TISSUES AND SKULL: No acute soft tissue abnormality. No skull fracture. IMPRESSION: 1. No acute intracranial abnormality. 2. Paranasal sinus mucosal thickening and air-fluid levels, concerning for acute sinusitis. Electronically signed by: Donnice Mania MD 02/08/2024 02:51 PM EST RP Workstation: [id number]     Procedures   Medications Ordered in the ED  ipratropium-albuterol  (DUONEB) 0.5-2.5 (3) MG/3ML nebulizer solution 3 mL (3 mLs Nebulization Given 02/09/24 0954)  methylPREDNISolone  sodium succinate (SOLU-MEDROL ) 125 mg/2 mL injection 125 mg (125 mg Intravenous Given 02/09/24 1019)                                    Medical Decision Making Risk Prescription drug management.   73 year old female with a history of rheumatoid arthritis, CAD, asthma, hypertension, and hyperlipidemia who presents to the emergency department with shortness of breath.   Initial Ddx:  Asthma exacerbation, COPD, URI, pneumonia, MI, PE, anemia  MDM/Course:  Patient presents to the emergency department with shortness of breath.  On exam does have very mildly diminished lung sounds but no significant wheezing.  No crackles or rails.  Vital signs are all reassuring and she satting well on room air.  She had a walking pulse ox with a nadir of 97% and no tachycardia.  She was  seen yesterday and had a workup that included a chest x-ray, CBC, and chemistry which were unremarkable.  EKG today shows nonspecific T wave abnormality but high-sensitivity troponin is undetectably low.  Given the duration of her symptoms do not feel repeat is indicated.  COVID and flu negative.  Did consider PE but without any symptoms of a DVT, chest pain, cough, or other major risk factors aside from her age feel this is unlikely especially because upon re-evaluation she was subjectively feeling much better after the steroid and albuterol  that she received  here today.  Suspect that there is a mild asthma exacerbation going on could potentially be a component of anxiety to her especially with her pulse ox reading low.  Encouraged her to go more based off of her symptoms rather than her home pulse ox readings as these can be often times inaccurate  This patient presents to the ED for concern of complaints listed in HPI, this involves an extensive number of treatment options, and is a complaint that carries with it a high risk of complications and morbidity. Disposition including potential need for admission considered.   Dispo: DC Home. Return precautions discussed including, but not limited to, those listed in the AVS. Allowed pt time to ask questions which were answered fully prior to dc.  Records reviewed ED Visit Notes I personally reviewed and interpreted cardiac monitoring: normal sinus rhythm  I personally reviewed and interpreted the pt's EKG: see above for interpretation  I have reviewed the patients home medications and made adjustments as needed Social Determinants of health:  Geriatric  Portions of this note were generated with Scientist, clinical (histocompatibility and immunogenetics). Dictation errors may occur despite best attempts at proofreading.     Final diagnoses:  Shortness of breath  Exacerbation of asthma, unspecified asthma severity, unspecified whether persistent    ED Discharge Orders     None           Yolande Lamar BROCKS, MD 02/09/24 1700

## 2024-02-09 NOTE — ED Triage Notes (Signed)
 Patient comes in for shortness of breath. She was seen at [facility] yesterday initially for a headache. But was found to have diminished lungs and was given steroids, nebulizer treatments, scans, and blood work. She says she lost her albuterol  that she was given. She has not yet taken her next dose of prednisone  today. She reports 88% and 93% at home. She said she took her daily maintenance inhaler Breztri . Normal vitals in triage. No signs of distress.

## 2024-02-09 NOTE — Discharge Instructions (Signed)
 You were seen for your asthma exacerbation in the emergency department.   At home, please use your inhalers for any wheezing.  Please take the steroids we have prescribed you for your asthma exacerbation as well.    Check your MyChart online for the results of any tests that had not resulted by the time you left the emergency department.   Follow-up with your primary doctor in 2-3 days regarding your visit.    Return immediately to the emergency department if you experience any of the following: Difficulty breathing, chest pain, or any other concerning symptoms.    Thank you for visiting our Emergency Department. It was a pleasure taking care of you today.

## 2024-02-09 NOTE — Telephone Encounter (Signed)
 Chart reviewed: Patient noted to be at [facility] ED [facility] since 09:29   Copied from CRM [id number]. Topic: Clinical - Red Word Triage >> Feb 09, 2024  8:29 AM Brandy Townsend wrote: Red Word that prompted transfer to Nurse Triage:   Patient had to call EMS yesterday and went to [facility] for an asthma attack - shes been put on Townsend steroid. Currently experiencing having Townsend hard time taking Townsend deep breath/SOB. Patient was told she has demolished lungs.

## 2024-02-12 NOTE — Telephone Encounter (Addendum)
 Copied from CRM #[id number]. Topic: Clinical - Medical Advice >> Feb 09, 2024  2:02 PM Isabell A wrote: Reason for CRM: Patient would like to know if her provider Dr.[name] would like a re-check follow up appointment for her lungs - patient went to ER for an asthma attack.   Callback number: [phone]  Called and advised pt to keep scheduled ov for 11/19. Pt is aware to keep scheduled appt. Nothing further needed.

## 2024-02-13 ENCOUNTER — Ambulatory Visit: Payer: Self-pay | Admitting: Internal Medicine

## 2024-02-13 ENCOUNTER — Encounter: Payer: Self-pay | Admitting: Internal Medicine

## 2024-02-13 ENCOUNTER — Ambulatory Visit: Admitting: Internal Medicine

## 2024-02-13 VITALS — BP 108/68 | HR 77 | Temp 98.0°F | Ht 63.0 in | Wt 150.8 lb

## 2024-02-13 DIAGNOSIS — Z87891 Personal history of nicotine dependence: Secondary | ICD-10-CM

## 2024-02-13 DIAGNOSIS — J4540 Moderate persistent asthma, uncomplicated: Secondary | ICD-10-CM

## 2024-02-13 DIAGNOSIS — Z9229 Personal history of other drug therapy: Secondary | ICD-10-CM

## 2024-02-13 DIAGNOSIS — R791 Abnormal coagulation profile: Secondary | ICD-10-CM

## 2024-02-13 DIAGNOSIS — R0609 Other forms of dyspnea: Secondary | ICD-10-CM

## 2024-02-13 DIAGNOSIS — R7989 Other specified abnormal findings of blood chemistry: Secondary | ICD-10-CM

## 2024-02-13 DIAGNOSIS — Z9221 Personal history of antineoplastic chemotherapy: Secondary | ICD-10-CM

## 2024-02-13 LAB — BASIC METABOLIC PANEL WITH GFR
BUN: 19 mg/dL (ref 6–23)
CO2: 29 meq/L (ref 19–32)
Calcium: 9.6 mg/dL (ref 8.4–10.5)
Chloride: 103 meq/L (ref 96–112)
Creatinine, Ser: 0.93 mg/dL (ref 0.40–1.20)
GFR: 61.78 mL/min (ref >60.00)
Glucose, Bld: 142 mg/dL — ABNORMAL HIGH (ref 70–99)
Potassium: 3.8 meq/L (ref 3.5–5.1)
Sodium: 141 meq/L (ref 135–145)

## 2024-02-13 LAB — PHOSPHORUS: Phosphorus: 4.2 mg/dL (ref 2.3–4.6)

## 2024-02-13 LAB — MAGNESIUM: Magnesium: 2.2 mg/dL (ref 1.5–2.5)

## 2024-02-13 LAB — D-DIMER, QUANTITATIVE: D-Dimer, Quant: 0.47 ug{FEU}/mL (ref <0.50)

## 2024-02-13 LAB — NITRIC OXIDE: Nitric Oxide: 28

## 2024-02-13 LAB — BRAIN NATRIURETIC PEPTIDE: Pro B Natriuretic peptide (BNP): 26 pg/mL (ref 0.0–100.0)

## 2024-02-13 NOTE — Addendum Note (Signed)
 Addended by: BURT EVERTT RAMAN on: 02/13/2024 03:50 PM   Modules accepted: Orders

## 2024-02-13 NOTE — Progress Notes (Signed)
 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 Loetta, intermittent chest tightness, no wheezing or cough   wheezng started roughly the same year she quit smoking assoc with wt gain x 35lb wt gain typically better p prednisone  and better since spring 2017  On asthmanex 2 each am with rare saba needed but then Feb 10 2016 chest tightness and did not try saba > ER 02/21/16 with w/u neg w/u except for a/f levels both max sinuses> Rx Rosen= prednsione > back to baseline at time of ov with no cough or sob or chest tightness and  Not limited by breathing from desired activities including treadmill at 3.5 mph flat x 1.5 miles  Other than assoc of flares with cold/sinus infections >> No obvious patterns in day to day or daytime variability or assoc excess/ purulent sputum or mucus plugs or hemoptysis or cp or chest tightness, subjective wheeze or overt   hb symptoms. No unusual exp hx or h/o childhood pna/ asthma or knowledge of premature birth. 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  No new complaints. 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 She has not had to use her albuterol  inhaler at all and has only been using the symbicort  1-2 puffs every am.   symb 80 1-2 each am at most never using the 2 bid max  Not limited by breathing from desired activities   No cough   No obvious day to day or daytime  variability or assoc excess/ purulent sputum or mucus plugs or hemoptysis or cp or chest tightness, subjective wheeze or overt sinus or hb symptoms. No unusual exp hx or h/o childhood pna/ asthma or knowledge of premature birth.  Sleeping ok flat without nocturnal  or early am exacerbation  of respiratory  c/o's or need for noct saba. Also denies any obvious fluctuation of symptoms with weather or environmental changes or other aggravating or alleviating factors except as outlined above   Current Allergies, Complete Past Medical History, Past Surgical History, Family History, and Social History were reviewed in [name] record.  ROS  The following are not active complaints unless bolded Hoarseness, sore throat, dysphagia, dental problems, itching, sneezing,  nasal congestion or discharge of excess mucus or purulent secretions,  ear ache,   fever, chills, sweats, unintended wt loss or wt gain, classically pleuritic or exertional cp,  orthopnea pnd or leg swelling, presyncope, palpitations, abdominal pain, anorexia, nausea, vomiting, diarrhea  or change in bowel habits or change in bladder habits, change in stools or change in urine, dysuria, hematuria,  rash, arthralgias, visual complaints, headache, numbness, weakness or ataxia or problems with walking or coordination,  change in mood/affect or memory.       OV 08/29/2019  Subjective:  Patient ID: Brandy Townsend, female , DOB: 03-Mar-1951 , age 73 y.o. , MRN: [id number] , ADDRESS: [address] [place] [address]   08/29/2019 -   Chief Complaint  Patient presents with   Consult    Abnormal CT, hx of asthma. hx of low oxygen level and cough.    Transfer of care from Dr. Ozell America to Dr. Geronimo.  HPI Brandy Townsend 73 y.o. -referred for possible abnormalities in the CT scan of the chest in April 2021 in the setting of rheumatoid arthritis 2018 (RF + and CCP > 250 per [name] over phone) and being on Arava and longstanding history of allergic asthma on dulera . 20 pack former smoking   73 year old female.  Former chartered loss adjuster.  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  She reports a many year many decade history of allergic asthma which flares up in the fall.  Between episodes she is not on any maintenance treatment.  In the fall when it flares up her previous primary care physician Dr. Loetta chou retired] would give her antibiotics and prednisone  one course and this would help it.  However in 2020 when the flareup happened she could not get access to the prednisone  and antibiotics because the primary care physician retired and in the setting of Covid it was televisit.  By the end of 2020 she ended up with 2 rounds of antibiotics and prednisone  that helped clear things up.  In the interim in the last 4 years she has had a diagnosis of  rheumatoid arthritis.  She was on methotrexate but had raised liver function test.  This seemed to help.  She failed Plaquenil and then was started on leflunomide which she is on currently.   In 2018 she did have spirometry and DLCO on pulmonary function testing that shows hyperinflation and air trapping with reduced DLCO but also normal spirometry.  In the backdrop of this in January 2020 when she underwent her first Covid vaccine.  Then in early February 2020 when she had a second Covid vaccine.  24 hours later she got hypotensive tachycardic and also hypoxemic and ended up in the ER.  I reviewed the ER  notes.  She was observed in the ER for whole day and her symptoms and signs resolved and the hypoxemia also resolved.  She was discharged with a prednisone  course.  She says subsequent to that she was short of breath and fatigue for 2 weeks and this also resolved.  She did see cardiology in February 2021.  She was reassured by a normal stress test.  After that she switched primary care physicians to Dr. Garnette Ore.  She is now established with a new rheumatologist at [facility] and is seen [name] the physician's assistant for the first time on August 27, 2019 2 days ago.  She also had a thyroid  biopsy which was nondiagnostic.  On the rheumatology visit 2 days ago it was felt her rheumatoid arthritis was active.  Blood has been drawn and work-up is in progress.  Through all this primary care physician did do CT chest without contrast.  I personally visualized this.  There is evidence of tree-in-bud that is mild and subtle.  Therefore she is referred here.  However at this point in time she is asymptomatic from a respiratory standpoint no wheezing no orthopnea no proximal nocturnal dyspnea no dyspnea on exertion no cough.  However in terms of rheumatoid arthritis there is plans to increase her immunomodulation. She has been started on low dose pred now by [name]. Currently labs  pending are CRP, Quant Gold and Hep Panel   D/w [name] later -> she is considering adding DMARD -discussed and she is supportive of getting bronch and rule out opportunistic infection  ROS - per HPI  IMPRESSION: CT chest visualized 1. Very mild, stable tree-in-bud appearing opacities along the anterolateral aspect of the bilateral upper lobes. Sequelae associated with atypical infection cannot be excluded. 2. Predominant stable thyroid  goiter.   Aortic Atherosclerosis (ICD10-I70.0).     Electronically Signed   By: Suzen Dials M.D.   On: 07/22/2019 22:34    OV 10/17/2019  Subjective:  Patient ID: Brandy Townsend, female , DOB: October 25, 1950 , age 43 y.o. , MRN: [id number] , ADDRESS: [address] [place] [address]   10/17/2019 -   Chief Complaint  Patient presents with   Follow-up    Pt is here to discuss results of the bronch. Pt states she has been doing good since last visit and denies any complaints.     HPI Brandy Townsend 73 y.o. -returns for follow-up.  At this point in time she is on prednisone  20 mg/day.  She has gained weight.  She is got some bruises.  She is not happy about her prednisone .  She denies any respiratory complaints.  Mid June 2020 when she underwent bronchoscopy with lavage.  I reviewed the results.  She is got some mild neutrophilia but organisms such as PCP and TB and fungus are negative.  She really wants to get started on a DMA RD so she can come down or get off prednisone .  I discussed with rheumatology PA [name] and she is of the understanding.  I stated there is no ILD.  They are going to focus on joint treatment.  Patient did indicate that in the fall she is at risk for respiratory exacerbations.  But she lives nearby and is agreeable to a 9-2-month follow-up.  She is supposed to get pulmonary function test today but that was not scheduled by our office.  Results for Mcadam, Sadia G (MRN [id number]) as of 10/17/2019 16:38  Ref.  Range 09/12/2019 08:59  Monocyte-Macrophage-Serous Fluid Latest Ref Range: 50 - 90 % 38 (L)  Other Cells, Fluid Latest Units: % CORRELATE WITH CYTOLOGY.  Fluid Type-FCT Unknown Bronch Lavag  Color, Fluid Latest Ref Range: YELLOW  COLORLESS (A)  Total Nucleated Cell Count, Fluid Latest Ref Range: 0 - 1,000 cu mm 15  Lymphs, Fluid Latest Units: % 4  Eos, Fluid Latest Units: % 2  Appearance, Fluid Latest Ref Range: CLEAR  CLOUDY (A)  Neutrophil Count, Fluid Latest Ref Range: 0 - 25 % 56 (H)   ROS - per HPI   Patient ID: Brandy Townsend, female    DOB: 07-04-1950, 73 y.o.   MRN: [id number]  Chief Complaint  Patient presents with   Follow-up    Cough     Referring provider: Nichole Senior, MD    12/10/2019 Acute OV : Cough  HPI: 73 year old female former smoker followed for abnormal CT chest with pulmonary infiltrates Medical history significant for rheumatoid arthritis    Patient presents for an acute office visit.  She complains of 1 week increased cough,congestion , and wheezing . Mucus is clear , hard to cough anything up.  She has underlying rheumatoid arthritis and is recently had her maintenance regimen changed to methotrexate in infliximab .    She had recent Covid test has been negative.  Her Covid vaccines are up-to-date.  She denies any fever or loss of taste or smell.   Patient is being followed for mild pulmonary infiltrates noted on CT chest.  She is negative for interstitial lung disease.  She underwent bronchoscopy in June 2021 that showed negative cultures and BAL.  Patient is followed by rheumatology and is on methotrexate and Renflexis      TEST/EVENTS :  In 2018 she did have spirometry and DLCO on pulmonary function testing that shows hyperinflation and air trapping with reduced DLCO but also normal spirometry.  CT chest July 22, 2019 showed very mild stable tree-in-bud appearing opacities along the bilateral upper lobes.  Bronchoscopy June 2021 showed cytology  positive for benign bronchial cells and pulmonary macrophages.,  AFB negative, BAL negative, M tubercular goes complex negative, fungal negative PCP negative   OV 01/23/2020  Subjective:  Patient ID: Brandy Townsend, female , DOB: Jan 19, 1951 , age 59 y.o. , MRN: [id number] , ADDRESS: [address] [place] [address] PCP Nichole Senior, MD Patient Care Team: Nichole Senior, MD as PCP - General (Endocrinology) Rox Charleston, MD as PCP - OBGYN (Obstetrics and Gynecology) Delford Maude BROCKS, MD as PCP - Cardiology (Cardiology)  This Provider for this visit: Treatment Team:  Attending Provider: Geronimo Amel, MD    01/23/2020 -   Chief Complaint  Patient presents with   Follow-up    Pt states she has been doing okay since last visit. States her breathing has been doing okay. Still becomes SOB but it happens out of nowhere and she isn't sure why or if it is coming due to her heart.   Rheumatoid arthritis with immunosuppression and mild pulmonary infiltrates.  BAL with neutrophilia blood culture negative-June 2021  Long history of allergic asthma previously on Dulera .  HPI Brandy Townsend 73 y.o. -returns for follow-up.  Since last seeing me she is seen nurse practitioner in September 2021.  At that time she was wheezing.  She was given Depo-Medrol  and outpatient Omnicef .  She says within a few days she responded to this.  Since then she feels back to baseline but she says even at  baseline she is short of breath with exertion relieved by rest.  She says is not normal for her.  She recently had a cardiac cath and she reports this is normal.  I reviewed the chart and agree with that.  She is also dealing with her husband has esophageal cancer.  Recently was in the ER because of' false Hyperkalemia according to history.  She is currently on TNF alpha blockade and methotrexate for her rheumatoid arthritis.  She says the joint symptoms have not fully improved.  She is waiting for the third  dose of the TNF alpha blockade before she is expecting to show improvement in her symptoms.  Overall the last few months have been high level of social stress.  She thinks it is slowly settling down.  She feels like things are changed after getting her Covid vaccine.  Nevertheless she understands she is immunosuppressed and she is in need of a booster.  She has some trepidation about getting the booster.  She is willing to have a IgG antibody levels checked as a means of triage her understanding antibody response to previous Covid vaccine.  Today she pulmonary function test in my view shows obstruction with significant bronchodilator response consistent with an asthma pattern.  Documented below.  I reviewed the graph.  Nitric oxide  test and asthma control test scores are listed below and shows disease activity   Asthma Control Test ACT Total Score  01/23/2020 18  08/29/2019 24     Lab Results  Component Value Date   NITRICOXIDE 34 01/23/2020      OV 03/04/2021  Subjective:  Patient ID: Brandy Townsend, female , DOB: [birth year] , age 11 y.o. , MRN: [id number] , ADDRESS: [address] [place] [address] PCP Nichole Senior, MD Patient Care Team: Nichole Senior, MD as PCP - General (Endocrinology) Rox Charleston, MD as PCP - OBGYN (Obstetrics and Gynecology) Delford Maude BROCKS, MD as PCP - Cardiology (Cardiology)  This Provider for this visit: Treatment Team:  Attending Provider: Geronimo Amel, MD    03/04/2021 -   Chief Complaint  Patient presents with   Follow-up    No new concerns     HPI Brandy Townsend 73 y.o. -returns for follow-up.  She tells me overall her asthma is doing well.  In September/October 2022 Dr. Reyes Loader added Singulair .  She was referred to allergist.  Work-up there has been reassuring.  She would not plan to go back to allergy clinic again.  She saw Dr. Frutoso.  She feels the Dulera  and Singulair  have helped her.  She wants a refill on her albuterol .   Her ACT control score shows improvement.  However her exhaled nitric oxide  score is 55 and is borderline high.  However she is not feeling it.  Of note she had a fourth COVID mRNA booster vaccine against delta and oh micron.  However 2 days after that she got significant side effects of joint flare and like fatigue.  She does not want to do the mRNA vaccine again.  This is second time she has had significant side effects.  She needed prednisone  this time.  After the second shot she ended up in the ER with tachycardia.  I have advised her that mRNA vaccine should be listed as allergy.  She is agreeable.     OV 06/01/2021  Subjective:  Patient ID: Brandy Townsend, female , DOB: 05-26-1950 , age 81 y.o. , MRN: [id number] , ADDRESS: [address] [place] [address] PCP Nichole Senior, MD Patient Care Team: Nichole Senior, MD as PCP - General (Endocrinology) Rox Charleston, MD as PCP - OBGYN (Obstetrics and Gynecology) Delford Maude BROCKS, MD as PCP - Cardiology (Cardiology)  This Provider for this visit: Treatment Team:  Attending Provider: Geronimo Amel, MD   06/01/2021 -   Chief Complaint  Patient presents with   Acute Visit    Pt has had complaints of cough and chest tightness which began overnight 3/6.    Rheumatoid arthritis with immunosuppression and mild pulmonary infiltrates.  BAL with neutrophilia blood culture negative-June 2021  Long history of allergic asthma HPI Brandy Townsend 73 y.o. -this is an acute visit.  She tells me that a few weeks ago she had cough and it went away.  Then she went to [place] .  She has returned and has been doing well.  No sick contacts.  Then all of a sudden she woke up in the middle of the night with chest pain.  It was as though there was some sharp sensation in the sternal area.  She reports history of normal stress test within a year.  She did have normal troponins in 2022 and 2021.  Review of the records indicate low risk cardiac stress  test February 2021.  Cardiac cath in October 2021 showed nonobstructive coronary artery disease with moderate disease in the first OM.  But in any event later she started having significant cough with green sputum.  She is also short of breath and fatigue.  The chest pain was associated with the cough.     12/02/2021 Follow up : Asthma  Patient presents for a 52-month follow-up.  Patient has moderate persistent asthma with an allergic phenotype.  She says overall she is doing very well.  Asthma is been under good control.  She denies any flare of cough or wheezing.  ACT score today is 24.  Exhaled nitric oxide  testing is at 62 ppb.  She remains on Breo inhaler daily.  She takes Zyrtec and Singulair  daily. No increased albuterol  use .   She does have rheumatoid arthritis followed by rheumatology on Orencia . Did have flare this summer treated with steroids , none since.   Very active.  Caregiver for her husband. Husband has had esophageal cancer, has feeding tube. Goes in and out of hospital.  Has went back to work partime , 1 day a week. At [employer] .  This is known to cause asthma exacerbation.  Her maintenance medication for asthma includes Breo and Singulair   She continues to deal with easy bruising on her forearms and arms that are pressure related.  She says it is idiopathic.  She believes her platelets are normal.   OV 06/07/2022  Subjective:  Patient ID: Brandy Townsend, female , DOB: 03/24/1951 , age 21 y.o. , MRN: [id number] , ADDRESS: [address] [place] [address] PCP Nichole Senior, MD Patient Care Team: Nichole Senior, MD as PCP - General (Endocrinology) Rox Charleston, MD as PCP - OBGYN (Obstetrics and Gynecology) Delford Maude BROCKS, MD as PCP - Cardiology (Cardiology)  This Provider for this visit: Treatment Team:  Attending Provider: Geronimo Amel, MD    06/07/2022 -   Chief Complaint  Patient presents with   Follow-up    Asthma well controlled.   Needs refill on Breo inhaler.  Has not used Breo in about a month.     HPI Brandy Townsend 73 y.o. -returns for follow-up.  She last saw me personally in May 2023.  Then in September [address] Tammy Parrott.  At the time the exam nitric oxide  was slightly high.  Then after Christmas 2023 she had influenza and got another round of prednisone .  Currently she is doing well.  She ran out of Breo a month ago and she still doing well ACT scores are at baseline.  She is on Singulair  but she is out of [place].  She is wondering whether she should restart the Breo or not. FENO IS HIGH52ppb  Social - Husband who is esophageal cancer and has a feeding tube is now getting at least 350 cal orally.  He is eating peaches and cheese crackers. - Daughter is in [place] -currently she is safe    OV 01/13/2023  Subjective:  Patient ID: Brandy Townsend, female , DOB: 1950-05-23 , age 62 y.o. , MRN: [id number] , ADDRESS: [address] [place] [address] PCP Nichole Senior, MD Patient Care Team: Nichole Senior, MD as PCP - General (Endocrinology) Rox Charleston, MD as PCP - OBGYN (Obstetrics and Gynecology) Delford Maude BROCKS, MD as PCP - Cardiology (Cardiology)  This Provider for this visit: Treatment Team:  Attending Provider: Geronimo Amel, MD    01/13/2023 -   Chief Complaint  Patient presents with   Follow-up    Breathing is overall doing well. She has sneezing and cough with clear sputum in the mornings. FENO= 66 today.      HPI Brandy Townsend 73 y.o. -returns for follow-up.  I saw her last in the spring 2024.  After that overall she was stable but December 18, 2022 she returned to the emergency department with multiple acute complaints.  I personally reviewed the external records.  Visualized the CT scan and the blood work.  Diagnose of acute bronchitis based on CT scan findings pulmonary embolism ruled out.  Right upper lobe tree-in-bud infiltrates noted.  But I do not appreciate that.  She says  she was having head numbness shortness of breath chest pressure but no cough or wheezing.  She was treated with antibiotic and she is at baseline.  She feels great ACT score is good but nitric oxide  is actually gone up from her baseline of 50 to greater than 60.  She is surprised by this.  We she is interested in therapeutic options she is worried her asthma would be getting out of control.  Review of the labs indicate she always has high elevated baseline eosinophils.  Skin allergy testing with Dr. Frutoso was positive some years ago  Of note for the last 2 years she is on Orencia  which is known to cause respiratory exacerbations particularly in COPD patients.  She did have a recent infusion of that.  She thought her MCHC in the blood work was low but at least in September in our records it was normal and I showed her this.    FeNO 50    04/12/2023- Interim hx  Patient presents today for 6 week follow-up.   Discussed the use of AI scribe software for clinical note transcription with the patient, who gave verbal consent to proceed.  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 This was her first experience with general anesthesia. Post-surgery, the patient reported no pain but took prescribed hydrocodone  as a precaution. Approximately 30 minutes later, she woke up with discomfort  in the back of her head. Upon checking her oxygen level, it was found to be at 80. After using her rescue inhaler twice, the level increased to 84, but she was advised to go to the ER.  At the ER, the patient's oxygen level fluctuated, dropping low but rebounding quickly. She was discharged the next day, but her oxygen levels remained lower than usual, staying around 93-94. The patient has not experienced levels below 92 since the surgery.  In the week following the surgery, the patient reported  feeling like she developed a kidney stone, had a stomach bug, and picked up a congestion bug from an ER visit with her husband. Despite these issues, her oxygen levels have not dropped below 93.  The patient has been using an incentive spirometer almost daily since the surgery. She also reported a cough, but it is not producing anything and she believes it is more related to postnasal drip. The patient has been using Breztri  for her asthma and her nitric oxide  levels have decreased from 60 to 50 since starting this medication.  The patient also has a history of rheumatoid arthritis and has been on Orencia  for two and a half years. She reported that her hand pain, which was previously severe, has significantly improved since switching her turmeric supplement in mid-October. She has a little hip pain, but no other pain.        OV 06/29/2023  Subjective:  Patient ID: Brandy Townsend, female , DOB: July 13, 1950 , age 28 y.o. , MRN: [id number] , ADDRESS: [address] [place] [address] PCP Nichole Senior, MD Patient Care Team: Nichole Senior, MD as PCP - General (Endocrinology) Rox Charleston, MD as PCP - OBGYN (Obstetrics and Gynecology) Delford Maude BROCKS, MD as PCP - Cardiology (Cardiology)  This Provider for this visit: Treatment Team:  Attending Provider: Geronimo Amel, MD    06/29/2023 -   Chief Complaint  Patient presents with   Follow-up    Breathing has overall been stable. She fractured rib picking up a heavy box a few days ago.      HPI ANARIA KRONER 73 y.o. -returns for follow-up.  From an asthma perspective she is doing well.  She has tympanoplasty and she is recovering although during the tympanoplasty she says she desaturated.  The acute issue is that 1 her breast tree which works really well for his very expensive her insurance coverage this week so I gave her 2 months worth of samples giving her time to inquire with insurance about an alternative.  In addition on March  30/2025 she is picking up somewhat of a heavy box but she has been doing this for 5 years.  Contains liquid drinks for her husband who has a PEG tube.  Later that evening she had a pop and since then excruciating pain.  She has a right rib 10 fracture.  She is communicating with the primary care physician about it.  Ends of note in September 2024 showed pulm infiltrates on the CT and this requires a 1 year follow-up.  She continues to be immunosuppressed.  Otherwise no new issues.   OV 01/09/2024  Subjective:  Patient ID: Brandy Townsend, female , DOB: [birth year] , age 4 y.o. , MRN: [id number] , ADDRESS: [address] [place] [address] PCP Nichole Senior, MD Patient Care Team: Nichole Senior, MD as PCP - General (Endocrinology) Rox Charleston, MD as PCP - OBGYN (Obstetrics and Gynecology) Delford Maude BROCKS, MD as  PCP - Cardiology (Cardiology)  This Provider for this visit: Treatment Team:  Attending Provider: Geronimo Amel, MD    01/09/2024 -   Chief Complaint  Patient presents with   Interstitial Lung Disease    Pt states she has had not problems with breathing since LOV     HPI Brandy Townsend 73 y.o. -returns for routine follow-up of asthma in the setting of rheumatoid arthritis.  She is overall doing well symptoms are minimal she is exercising regularly.  It has been 5 years since her husband had cancer in remission.  They travel to [place]  which was his first trip.  They enjoyed this.  Her only main complaint is that she has easy bruising.  Between April 2025 and August 2025 she stopped her baby aspirin  but the bruising continued.  In August 2020 for she got admitted to the hospital with unstable angina per chart review.  After that she was restarted on aspirin .  This been no change in bruising because of the aspirin .  She is doing well she is exercising.  I did indicate to her the breast tree which is inhaled steroid could be contributing to the bruising but she feels it is  helping for her.  She pays a lot of money for this but she feels it is the best inhaler.  Gave her some samples.  She does not want to stop the Breztri .  Her general symptoms and exercise hypoxemia test is listed below.  Of note her CT scan of the chest showed some asymmetric breast tissue on the right side.  Unclear if it was chronic from 2023 or new.  I did show her the arrow by personal visualization of the image by the radiologist pointed out.  I asked her to talk to her primary care physician Dr. Nichole.   CT Chest data from date: 12/15/23  - personally visualized and independently interpreted : yes - my findings are: as below  IMPRESSION: 1. 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 This could be postinflammatory, or less likely might reflect chronic atypical lung infection (middle lobes and lower lobe spared). Chronically stable mild nodularity of the tracheal wall also appears to be benign.   2. Recommend ensuring patient up-to-date on screening Mammography: Asymmetric Right breast tissue appears grossly stable from a 2023 CTA.   3. Aortic Atherosclerosis (ICD10-I70.0). Coronary artery atherosclerosis.     Electronically Signed   By: VEAR Hurst M.D.   On: 12/21/2023 13:3   OV 02/13/2024  Subjective:  Patient ID: Brandy Townsend, female , DOB: 06-Sep-1950 , age 73 y.o. , MRN: [id number] , ADDRESS: [address] [place] [id number] PCP Nichole Senior, MD Patient Care Team: Nichole Senior, MD as PCP - General (Endocrinology) Rox Charleston, MD as PCP - OBGYN (Obstetrics and Gynecology) Delford Maude BROCKS, MD as PCP - Cardiology (Cardiology)  This Provider for this visit: Treatment Team:  Attending Provider: Geronimo Amel, MD    02/13/2024 -   Chief Complaint  Patient presents with   Acute Visit    Pt states she is having a hard time  breathing, pt states she went to ER on Thursday (02/08/2024) and went back Friday (02/09/2024) , pt stated she received breathing treatments while in ER SOB occurs w/ any activity Pt stated at the ER she was tod her lungs were damaged    Rheumatoid arthritis with  immunosuppression and mild pulmonary infiltrates.  BAL with neutrophilia blood culture negative-June 2021  Long history of allergic asthma previously on Dulera .  HPI Brandy Townsend 73 y.o. -Brandy Townsend is a 73 year old female who presents with shortness of breath and headache. ACUTE VISIT. Just saw her a months ago  She has been experiencing difficulty taking a deep breath since Thursday, February 08, 2024, which was preceded by a severe headache on Monday, February 05, 2024. On Thursday morning, she developed shortness of breath and vomiting, prompting her to call EMS. EMS administered a breathing treatment and steroids. At the hospital, she received another breathing treatment, prednisone , and Zofran , and was prescribed prednisone  for the following day. Labs in ER only positve for mild low K. CT head showed poissilbe acute sinusitis  On Friday, February 09, 2024, she felt faint at  prirt physical therapy appointment and received another breathing treatment and IV steroids at the hospital. She continued taking prednisone  as prescribed and felt slightly better by Friday night. However, her symptoms recurred on Saturday. She has been using her inhaler, which was replaced after being lost, and reports persistent shortness of breath without cough or wheezing.  She mentions a history of elevated D-dimer levels, though blood clots were ruled out in the past. A recent chest x-ray was clear. No fever, chills, or nasal congestion. She reports back pain between her shoulder blades and bruising on her legs, - OLD STUFF  NEW : h starting methotrexate a month ago. She takes four 2.5 mg methotrexate pills x 4 pills every Sunday. OLD: She recalls a  similar episode after receiving a COVID shot, which also resulted in an ER visit.  Reports some pain in upepr interscapular area  Trop 11/17 normaml CXR - visualized and NORMAL   Las BNP 2018 and normal Last ECHO aug 2025 an dnormal Last CTA sept 2024 and no PE   SYMPTOM SCALE -general 01/09/2024  Current weight   O2 use ra  Shortness of Breath 0 -> 5 scale with 5 being worst (score 6 If unable to do)  At rest 0  Simple tasks - showers, clothes change, eating, shaving 0  Household (dishes, doing bed, laundry) 1  Shopping 0  Walking level at own pace 1  Walking up Stairs 1  Total (30-36) Dyspnea Score 3  How bad is your cough? 0  How bad is your fatigue 3  How bad is appetiee 2  How bad is nausea 0  How bad is vomiting?  0  How bad is diarrhea? 0  How bad is anxiety? 3  How bad is depression 0  Any chronic pain - if so where and how bad Easy bruosing all limbs        SIT STAND TEST - goal 15 times   01/09/2024    O2 used ra   PRobe - finter or forehead finger   Number sit and stand completed - goal 15 15   Time taken to complete 38 sec   Resting Pulse Ox/HR/Dyspnea  99% and 74/min and dyspnea of 0/10    Peak measures 98 % and 92/min and dyspnea of 1/10   Final Pulse Ox/HR 100% and 71/min and dyspnea of 0/10   Desaturated </= 88% no   Desaturated <= 3% points no   Got Tachycardic >/= 90/min yes   Miscellaneous comments x    Simple office walk 224 (66+46 x 2) feet Pod A at [facility] x  3 laps  goal with forehead probe 02/13/2024 FENO 02/13/2024 - 28 pbb and normal    O2 used ra   Number laps completed 3   Comments about pace regyakr   Resting Pulse Ox/HR 97% and 67/min   Final Pulse Ox/HR 99% and 75/min   Desaturated </= 88% no   Desaturated <= 3% points no   Got Tachycardic >/= 90/min no   Symptoms at end of test x   Miscellaneous comments x     FENO 02/13/2024 - 28 ppb Lab Results  Component Value Date   NITRICOXIDE 66 01/13/2023   This result  suggests low (<25) Type 2 (T2) airway inflammation indicating a low likelihood of active T2-driven airway inflammation; reduced probability of response to inhaled corticosteroids.    PFT     Latest Ref Rng & Units 01/23/2020    8:43 AM 01/13/2017    8:40 AM  PFT Results  FVC-Pre L 2.08  2.43   FVC-Predicted Pre % 69  79   FVC-Post L 2.41  2.46   FVC-Predicted Post % 80  80   Pre FEV1/FVC % % 72  73   Post FEV1/FCV % % 75  76   FEV1-Pre L 1.51  1.77   FEV1-Predicted Pre % 66  75   FEV1-Post L 1.81  1.87   DLCO uncorrected ml/min/mmHg 18.49  16.68   DLCO UNC% % 97  72   DLCO corrected ml/min/mmHg 18.38  17.68   DLCO COR %Predicted % 96  77   DLVA Predicted % 108  83   TLC L 5.38  5.90   TLC % Predicted % 109  120   RV % Predicted % 142  162        LAB RESULTS last 96 hours No results found.       has a past medical history of Asthma, CAD (coronary artery disease), Diverticulosis, Graves disease, High cholesterol, Hypertension, Hypothyroidism, Nephrolithiasis, Osteoporosis, Rheumatoid arthritis (HCC), and Vertigo.   reports that she quit smoking about 15 years ago. Her smoking use included cigarettes. She started smoking about 35 years ago. She has a 20 pack-year smoking history. She has never used smokeless tobacco.  Past Surgical History:  Procedure Laterality Date   BRONCHIAL WASHINGS  09/12/2019   Procedure: BRONCHIAL WASHINGS;  Surgeon: Geronimo Amel, MD;  Location: [facility];  Service: Endoscopy;;   CORONARY PRESSURE/FFR STUDY N/A 01/08/2020   Procedure: INTRAVASCULAR PRESSURE WIRE/FFR STUDY;  Surgeon: Jordan, Peter M, MD;  Location: [facility] INVASIVE CV LAB;  Service: Cardiovascular;  Laterality: N/A;   INNER EAR SURGERY Right 02/27/2023   LEFT HEART CATH AND CORONARY ANGIOGRAPHY N/A 01/08/2020   Procedure: LEFT HEART CATH AND CORONARY ANGIOGRAPHY;  Surgeon: Jordan, Peter M, MD;  Location: [facility]VASIVE CV LAB;  Service: Cardiovascular;  Laterality: N/A;   LEFT  HEART CATH AND CORONARY ANGIOGRAPHY N/A 11/13/2023   Procedure: LEFT HEART CATH AND CORONARY ANGIOGRAPHY;  Surgeon: Mady Bruckner, MD;  Location: [facility] INVASIVE CV LAB;  Service: Cardiovascular;  Laterality: N/A;   TYMPANOPLASTY Right 02/27/2023   Procedure: TYMPANOPLASTY;  Surgeon: Jesus Oliphant, MD;  Location: [facility];  Service: ENT;  Laterality: Right;   VIDEO BRONCHOSCOPY N/A 09/12/2019   Procedure: VIDEO BRONCHOSCOPY WITHOUT FLUORO;  Surgeon: Geronimo Amel, MD;  Location: [facility];  Service: Endoscopy;  Laterality: N/A;    Allergies  Allergen Reactions   Cat Dander     Other reaction(s): congestion   Dust Mite Extract     Other reaction(s):  Unknown   Macrobid [Nitrofurantoin Monohyd Macro] Other (See Comments)    Lowered potassium, caused aches and pains   Morphine  And Codeine Nausea And Vomiting    Immunization History  Administered Date(s) Administered   Fluad  Quad(high Dose 65+) 01/20/2020, 12/31/2021   Fluad  Trivalent(High Dose 65+) 01/09/2023   INFLUENZA, HIGH DOSE SEASONAL PF 01/03/2017, 12/27/2018, 03/03/2021, 01/19/2024   Influenza Whole 01/04/2017   Influenza, Quadrivalent, Recombinant, Inj, Pf 01/09/2018, 01/11/2019   Influenza,inj,quad, With Preservative 12/26/2016   Influenza-Unspecified 12/26/2013, 12/27/2014, 12/27/2015   PFIZER(Purple Top)SARS-COV-2 Vaccination 04/17/2019, 05/06/2019, 01/31/2020   Pfizer Covid-19 Vaccine Bivalent Booster 76yrs & up 01/22/2021   Pneumococcal Conjugate-13 04/28/2014, 05/26/2014   Pneumococcal Polysaccharide-23 11/22/2017, 03/03/2021   Respiratory Syncytial Virus Vaccine ,Recomb Aduvanted(Arexvy ) 03/10/2022   Zoster Recombinant(Shingrix) 01/14/2019, 04/02/2019   Zoster, Live 05/22/2013, 04/01/2019    Family History  Problem Relation Age of Onset   Breast cancer Mother    Heart disease Father    Cancer Daughter 44       stage 3 breast      Current Outpatient Medications:    abatacept  (ORENCIA ) 250  MG injection, Inject 250 mg into the vein See admin instructions. Inject 250 mg IV once every month., Disp: , Rfl:    albuterol  (VENTOLIN  HFA) 108 (90 Base) MCG/ACT inhaler, Inhale 1-2 puffs into the lungs every 6 (six) hours as needed for wheezing or shortness of breath., Disp: 8 g, Rfl: 5   aspirin  EC 81 MG tablet, Take 1 tablet (81 mg total) by mouth daily. Swallow whole., Disp: 90 tablet, Rfl: 3   budeson-glycopyrrolate -formoterol  (BREZTRI  AEROSPHERE) 160-9-4.8 MCG/ACT AERO, Inhale 2 puffs into the lungs in the morning and at bedtime., Disp: , Rfl:    budesonide -glycopyrrolate -formoterol  (BREZTRI  AEROSPHERE) 160-9-4.8 MCG/ACT AERO inhaler, Inhale 2 puffs into the lungs in the morning and at bedtime., Disp: , Rfl:    cetirizine (ZYRTEC) 10 MG tablet, Take 10 mg by mouth daily., Disp: , Rfl:    folic acid (FOLVITE) 1 MG tablet, 1 tablet Orally Once a day; Duration: 90 days, Disp: , Rfl:    levothyroxine  (SYNTHROID ) 75 MCG tablet, Take 1 tablet (75 mcg total) by mouth every morning on an empty stomach, Disp: 90 tablet, Rfl: 4   methotrexate (RHEUMATREX) 2.5 MG tablet, Take 10 mg by mouth once a week., Disp: , Rfl:    montelukast  (SINGULAIR ) 10 MG tablet, Take 1 tablet (10 mg total) by mouth daily at night, Disp: 90 tablet, Rfl: 3   Multiple Vitamins-Minerals (EQ MULTIVITAMINS ADULT GUMMY PO), Take 1 each by mouth daily., Disp: , Rfl:    nitroGLYCERIN  (NITROSTAT ) 0.4 MG SL tablet, Place 1 tablet (0.4 mg total) under the tongue every 5 (five) minutes as needed for chest pain., Disp: 25 tablet, Rfl: 3   potassium chloride  (KLOR-CON ) 10 MEQ tablet, Take 1 tablet (10 mEq total) by mouth daily., Disp: 90 tablet, Rfl: 3   rosuvastatin  (CRESTOR ) 10 MG tablet, Take 1 tablet (10 mg total) by mouth daily., Disp: 90 tablet, Rfl: 2   torsemide  (DEMADEX ) 20 MG tablet, Take 1 tablet (20 mg total) by mouth in the morning AND ONE-HALF tablet (10 mg total) every evening., Disp: 135 tablet, Rfl: 4   Turmeric (QC  TUMERIC COMPLEX PO), Take 2 tablets by mouth daily., Disp: , Rfl:    Vitamin D, Ergocalciferol, (DRISDOL) 1.25 MG (50000 UNIT) CAPS capsule, Take 50,000 Units by mouth every 7 (seven) days. Monday, Disp: , Rfl:    diclofenac  Sodium (VOLTAREN ) 1 % GEL,  Apply 4 g topically 4 (four) times daily. (Patient not taking: Reported on 02/13/2024), Disp: 100 g, Rfl: 0   meclizine  (ANTIVERT ) 12.5 MG tablet, Take 1 tablet (12.5 mg total) by mouth 3 (three) times daily as needed for dizziness. (Patient not taking: Reported on 02/13/2024), Disp: 30 tablet, Rfl: 0   predniSONE  (DELTASONE ) 10 MG tablet, Take 3 tablets (30 mg total) by mouth daily. (Patient not taking: Reported on 02/13/2024), Disp: 4 tablet, Rfl: 0   verapamil  (CALAN -SR) 120 MG CR tablet, TAKE 1 TABLET (120 MG TOTAL) BY MOUTH DAILY AS NEEDED. (Patient taking differently: Take 80 mg by mouth daily.), Disp: 90 tablet, Rfl: 3      Objective:   Vitals:   02/13/24 1423  BP: 108/68  Pulse: 77  Temp: 98 F (36.7 C)  TempSrc: Oral  SpO2: 97%  Weight: 150 lb 12.8 oz (68.4 kg)  Height: 5' 3 (1.6 m)    Estimated body mass index is 26.71 kg/m as calculated from the following:   Height as of this encounter: 5' 3 (1.6 m).   Weight as of this encounter: 150 lb 12.8 oz (68.4 kg).  @WEIGHTCHANGE @  Filed Weights   02/13/24 1423  Weight: 150 lb 12.8 oz (68.4 kg)     Physical Exam   General: No distress. Looks a bit worried O2 at rest: no Cane present: no Sitting in wheel chair: no Frail: no Obese: no Neuro: Alert and Oriented x 3. GCS 15. Speech normal Psych: Pleasant Resp:  Barrel Chest - no.  Wheeze - no, Crackles - no, No overt respiratory distress CVS: Normal heart sounds. Murmurs - no Ext: Stigmata of Connective Tissue Disease - no HEENT: Normal upper airway. PEERL +. No post nasal drip        Assessment/     Assessment & Plan DOE (dyspnea on exertion)  Positive D dimer  Moderate persistent asthma without  complication  History of methotrexate therapy    PLAN Patient Instructions     ICD-10-CM   1. DOE (dyspnea on exertion)  R06.09 Basic Metabolic Panel (BMET)    Magnesium    Phosphorus    D-Dimer, Quantitative    B Nat Peptide    2. Positive D dimer  R79.89 Basic Metabolic Panel (BMET)    Magnesium    Phosphorus    D-Dimer, Quantitative    B Nat Peptide    3. Moderate persistent asthma without complication  J45.40 Basic Metabolic Panel (BMET)    Magnesium    Phosphorus    D-Dimer, Quantitative    B Nat Peptide    4. History of methotrexate therapy  Z92.21 Basic Metabolic Panel (BMET)    Magnesium    Phosphorus    D-Dimer, Quantitative    B Nat Peptide      Dyspnea  - unclear cause ? Methotrextae reaction? Blood clot? Heart failure ? asthma  Plan - check bmet, phos, mag,  - check bnp, d-dimer  -based on results, decide HRCT repeat versus CT angio chest versus holding methoterxate  Moderate persistent asthma without complication  - seems stable and well controlled bsed on feno though this could be acting up and need results from above workup to see if we should do steroids  Plan  - Continue Breztri  daily; respite the fact you do not - Continue albuterol  as needed - based on results might have to extend prednisone   Easy bruising  -This did not go away.  Aspirin  will be in April 2025 and  August 2025 =-I suspect inhaled steroid and Breztri  might be contributing to this  Plan - Continue to monitor because we took a shared decision making that he want to continue Breztri    follow-up - Return in 3-4 weeks to see APP    FOLLOWUP    Return for - Return in 3-4 weeks to see APP. Dr Chase.    SIGNATURE    Dr. Dorethia Cave, M.D., F.C.C.P,  Pulmonary and Critical Care Medicine Staff Physician, [facility] Director - Interstitial Lung Disease  Program  Pulmonary Fibrosis [facility] [place], [place], [address]  Pager: [phone], If no answer or between  15:00h - 7:00h: call [phone] Telephone: [phone]  3:03 PM 02/13/2024   Moderate Complexity MDM OFFICE  2021 E/M guidelines, first released in 2021, with minor revisions added in 2023 and 2024 Must meet the requirements for 2 out of 3 dimensions to qualify.    Number and complexity of problems addressed Amount and/or complexity of data reviewed Risk of complications and/or morbidity  One or more chronic illness with mild exacerbation, OR progression, OR  side effects of treatment  Two or more stable chronic illnesses  One undiagnosed new problem with uncertain prognosis  One acute illness with systemic symptoms   One Acute complicated injury Must meet the requirements for 1 of 3 of the categories)  Category 1: Tests and documents, historian  Any combination of 3 of the following:  Assessment requiring an independent historian  Review of prior external note(s) from each unique source  Review of results of each unique test  Ordering of each unique test    Category 2: Interpretation of tests   Independent interpretation of a test performed by another physician/other qualified health care professional (not separately reported)  Category 3: Discuss management/tests  Discussion of management or test interpretation with external physician/other qualified health care professional/appropriate source (not separately reported) Moderate risk of morbidity from additional diagnostic testing or treatment Examples only:  Prescription drug management  Decision regarding minor surgery with identfied patient or procedure risk factors  Decision regarding elective major surgery without identified patient or procedure risk factors  Diagnosis or treatment significantly limited by social determinants of health             HIGh Complexity  OFFICE   2021 E/M guidelines, first released  in 2021, with minor revisions added in 2023. Must meet the requirements for 2 out of 3 dimensions to qualify.    Number and complexity of problems addressed Amount and/or complexity of data reviewed Risk of complications and/or morbidity  Severe exacerbation of chronic illness  Acute or chronic illnesses that may pose a threat to life or bodily function, e.g., multiple trauma, acute MI, pulmonary embolus, severe respiratory distress, progressive rheumatoid arthritis, psychiatric illness with potential threat to self or others, peritonitis, acute renal failure, abrupt change in neurological status Must meet the requirements for 2 of 3 of the categories)  Category 1: Tests and documents, historian  Any combination of 3 of the following:  Assessment requiring an independent historian  Review of prior external note(s) from each unique source  Review of results of each unique test  Ordering of each unique test    Category 2: Interpretation of tests    Independent interpretation of a test performed by another physician/other qualified health care professional (not separately reported)  Category 3: Discuss management/tests  Discussion of management  or test interpretation with external physician/other qualified health care professional/appropriate source (not separately reported)  HIGH risk of morbidity from additional diagnostic testing or treatment Examples only:  Drug therapy requiring intensive monitoring for toxicity  Decision for elective major surgery with identified pateint or procedure risk factors  Decision regarding hospitalization or escalation of level of care  Decision for DNR or to de-escalate care   Parenteral controlled  substances            LEGEND - Independent interpretation involves the interpretation of a test for which there is a CPT code, and an interpretation or report is customary. When a review and interpretation of a test is performed and  documented by the provider, but not separately reported (billed), then this would represent an independent interpretation. This report does not need to conform to the usual standards of a complete report of the test. This does not include interpretation of tests that do not have formal reports such as a complete blood count with differential and blood cultures. Examples would include reviewing a chest radiograph and documenting in the medical record an interpretation, but not separately reporting (billing) the interpretation of the chest radiograph.   An appropriate source includes professionals who are not health care professionals but may be involved in the management of the patient, such as a clinical research associate, upper officer, case manager or teacher, and does not include discussion with family or informal caregivers.    - SDOH: SDOH are the conditions in the environments where people are born, live, learn, work, play, worship, and age that affect a wide range of health, functioning, and quality-of-life outcomes and risks. (e.g., housing, food insecurity, transportation, etc.). SDOH-related Z codes ranging from Z55-Z65 are the ICD-10-CM diagnosis codes used to document SDOH data Z55 - Problems related to education and literacy Z56 - Problems related to employment and unemployment Z57 - Occupational exposure to risk factors Z58 - Problems related to physical environment Z59 - Problems related to housing and economic circumstances [phone] - Problems related to social environment [phone] - Problems related to upbringing [phone] - Other problems related to primary support group, including family circumstances Z44 - Problems related to certain psychosocial circumstances Z65 - Problems related to other psychosocial circumstances

## 2024-02-13 NOTE — Telephone Encounter (Signed)
   Results given - normal Seeing Dr Delford - In few days  Plan  Monitor - give us  update in 48h anyway via phone call   Use judgement if going to do steroids Hold Methotrexate this upcoming Sunday 02/18/24 and monitpr     SIGNATURE    Dr. Dorethia Cave, M.D., F.C.C.P,  Pulmonary and Critical Care Medicine Staff Physician, [facility] Center Director - Interstitial Lung Disease  Program  Pulmonary Fibrosis [facility] at [facility] [place], [place], [address]   Pager: [phone], If no answer  -> Check AMION or Try [phone] Telephone (clinical office): [phone] Telephone (research): [phone]  6:11 PM 02/13/2024    LABS   BNP And D-dimer normal   PULMONARY No results for input(s): PHART, PCO2ART, PO2ART, HCO3, TCO2, O2SAT in the last 168 hours.  Invalid input(s): PCO2, PO2  CBC Recent Labs  Lab 02/08/24 1403  HGB 13.8  HCT 42.5  WBC 10.1  PLT 302    COAGULATION No results for input(s): INR in the last 168 hours.  CARDIAC  No results for input(s): TROPONINI in the last 168 hours. Recent Labs  Lab 02/13/24 1512  PROBNP 26.0     CHEMISTRY Recent Labs  Lab 02/08/24 1403 02/13/24 1512  NA 142 141  K 3.4* 3.8  CL 104 103  CO2 23 29  GLUCOSE 112* 142*  BUN 19 19  CREATININE 0.80 0.93  CALCIUM  9.7 9.6  MG 2.1 2.2  PHOS  --  4.2   Estimated Creatinine Clearance: 51.5 mL/min (by C-G formula based on SCr of 0.93 mg/dL).   LIVER Recent Labs  Lab 02/08/24 1403  AST 22  ALT 23  ALKPHOS 127*  BILITOT 0.3  PROT 7.2  ALBUMIN 4.1     INFECTIOUS No results for input(s): LATICACIDVEN, PROCALCITON in the last 168 hours.   ENDOCRINE CBG (last 3)  No results for input(s): GLUCAP in the last 72 hours.       IMAGING x48h  - image(s) personally visualized  -   highlighted in bold No results found.

## 2024-02-13 NOTE — Telephone Encounter (Signed)
 FYI Only or Action Required?: FYI only for provider: appointment scheduled on today.  Patient is followed in Pulmonology for ashtma, last seen on 01/09/2024 by Geronimo Amel, MD.  Called Nurse Triage reporting Shortness of Breath.  Symptoms began a week ago.  Interventions attempted: Rescue inhaler and Maintenance inhaler.  Symptoms are: gradually worsening.  Triage Disposition: See HCP Within 4 Hours (Or PCP Triage)  Patient/caregiver understands and will follow disposition?: Yes       Copied from CRM #[id number]. Topic: Clinical - Red Word Triage >> Feb 13, 2024  9:45 AM Rilla NOVAK wrote: [facility] that prompted transfer to Nurse Triage: [facility] Breathing       E2C2 Pulmonary Triage - Initial Assessment Questions "Chief Complaint (e.g., cough, sob, wheezing, fever, chills, sweat or additional symptoms) *Go to specific symptom protocol after initial questions. Shortness of breath   "How long have symptoms been present?" 1 week  Have you tested for COVID or Flu? Note: If not, ask patient if a home test can be taken. If so, instruct patient to call back for positive results. No  MEDICINES:   "Have you used any OTC meds to help with symptoms?" No If yes, ask "What medications?" N/A  "Have you used your inhalers/maintenance medication?" Yes If yes, "What medications?" Breztri  and Albuterol   If inhaler, ask "How many puffs and how often?" Note: Review instructions on medication in the chart. Albuterol  twice today   OXYGEN: "Do you wear supplemental oxygen?" No If yes, "How many liters are you supposed to use?" N/A  "Do you monitor your oxygen levels?" Yes If yes, What is your reading (oxygen level) today? 98%  What is your usual oxygen saturation reading?  (Note: Pulmonary O2 sats should be 90% or greater) 98%     Reason for Disposition  [1] Longstanding difficulty breathing (e.g., CHF, COPD, emphysema) AND [2] WORSE than normal  Answer Assessment -  Initial Assessment Questions 1. RESPIRATORY STATUS: Describe your breathing? (e.g., wheezing, shortness of breath, unable to speak, severe coughing)      Shortness of breath   2. ONSET: When did this breathing problem begin?      1 week ago  3. PATTERN Does the difficult breathing come and go, or has it been constant since it started?      Constant  4. SEVERITY: How bad is your breathing? (e.g., mild, moderate, severe)      Mild to moderate  5. RECURRENT SYMPTOM: Have you had difficulty breathing before? If Yes, ask: When was the last time? and What happened that time?      Yes 6. CARDIAC HISTORY: Do you have any history of heart disease? (e.g., heart attack, angina, bypass surgery, angioplasty)      Yes 7. LUNG HISTORY: Do you have any history of lung disease?  (e.g., pulmonary embolus, asthma, emphysema)     Asthma  8. CAUSE: What do you think is causing the breathing problem?      History of asthma  9. OTHER SYMPTOMS: Do you have any other symptoms? (e.g., chest pain, cough, dizziness, fever, runny nose)     No 10. O2 SATURATION MONITOR:  Do you use an oxygen saturation monitor (pulse oximeter) at home? If Yes, ask: What is your reading (oxygen level) today? What is your usual oxygen saturation reading? (e.g., 95%)       98%  Protocols used: Breathing Difficulty-A-AH

## 2024-02-13 NOTE — Patient Instructions (Addendum)
 ICD-10-CM   1. DOE (dyspnea on exertion)  R06.09 Basic Metabolic Panel (BMET)    Magnesium    Phosphorus    D-Dimer, Quantitative    B Nat Peptide    2. Positive D dimer  R79.89 Basic Metabolic Panel (BMET)    Magnesium    Phosphorus    D-Dimer, Quantitative    B Nat Peptide    3. Moderate persistent asthma without complication  J45.40 Basic Metabolic Panel (BMET)    Magnesium    Phosphorus    D-Dimer, Quantitative    B Nat Peptide    4. History of methotrexate therapy  Z92.21 Basic Metabolic Panel (BMET)    Magnesium    Phosphorus    D-Dimer, Quantitative    B Nat Peptide      Dyspnea  - unclear cause ? Methotrextae reaction? Blood clot? Heart failure ? asthma  Plan - check bmet, phos, mag,  - check bnp, d-dimer  -based on results, decide HRCT repeat versus CT angio chest versus holding methoterxate  Moderate persistent asthma without complication  - seems stable and well controlled bsed on feno though this could be acting up and need results from above workup to see if we should do steroids  Plan  - Continue Breztri  daily; respite the fact you do not - Continue albuterol  as needed - based on results might have to extend prednisone   Easy bruising  -This did not go away.  Aspirin  will be in April 2025 and August 2025 =-I suspect inhaled steroid and Breztri  might be contributing to this  Plan - Continue to monitor because we took a shared decision making that he want to continue Breztri    follow-up - Return in 3-4 weeks to see APP

## 2024-02-14 ENCOUNTER — Ambulatory Visit: Admitting: Nurse Practitioner

## 2024-02-15 ENCOUNTER — Encounter: Payer: Self-pay | Admitting: Internal Medicine

## 2024-02-19 ENCOUNTER — Ambulatory Visit: Attending: Cardiovascular Disease | Admitting: Cardiovascular Disease

## 2024-02-19 ENCOUNTER — Encounter: Payer: Self-pay | Admitting: Cardiovascular Disease

## 2024-02-19 VITALS — BP 100/72 | HR 69 | Ht 63.0 in | Wt 152.0 lb

## 2024-02-19 DIAGNOSIS — I10 Essential (primary) hypertension: Secondary | ICD-10-CM | POA: Diagnosis present

## 2024-02-19 DIAGNOSIS — I25119 Atherosclerotic heart disease of native coronary artery with unspecified angina pectoris: Secondary | ICD-10-CM | POA: Diagnosis present

## 2024-02-19 DIAGNOSIS — E782 Mixed hyperlipidemia: Secondary | ICD-10-CM | POA: Insufficient documentation

## 2024-02-19 DIAGNOSIS — J4540 Moderate persistent asthma, uncomplicated: Secondary | ICD-10-CM | POA: Insufficient documentation

## 2024-02-19 DIAGNOSIS — E785 Hyperlipidemia, unspecified: Secondary | ICD-10-CM | POA: Insufficient documentation

## 2024-02-19 NOTE — Patient Instructions (Signed)
 Medication Instructions:  Your physician recommends that you continue on your current medications as directed. Please refer to the Current Medication list given to you today.  *If you need a refill on your cardiac medications before your next appointment, please call your pharmacy*  Lab Work: None  If you have labs (blood work) drawn today and your tests are completely normal, you will receive your results only by: MyChart Message (if you have MyChart) OR A paper copy in the mail If you have any lab test that is abnormal or we need to change your treatment, we will call you to review the results.  Testing/Procedures: None   Follow-Up: At [facility], you and your health needs are our priority.  As part of our continuing mission to provide you with exceptional heart care, our providers are all part of one team.  This team includes your primary Cardiologist (physician) and Advanced Practice Providers or APPs (Physician Assistants and Nurse Practitioners) who all work together to provide you with the care you need, when you need it.  Your next appointment:   6 month(s)  Provider:   Maude Emmer, MD    We recommend signing up for the patient portal called MyChart.  Sign up information is provided on this After Visit Summary.  MyChart is used to connect with patients for Virtual Visits (Telemedicine).  Patients are able to view lab/test results, encounter notes, upcoming appointments, etc.  Non-urgent messages can be sent to your provider as well.   To learn more about what you can do with MyChart, go to [email or web address].   Other Instructions None

## 2024-02-20 ENCOUNTER — Other Ambulatory Visit (HOSPITAL_BASED_OUTPATIENT_CLINIC_OR_DEPARTMENT_OTHER): Payer: Self-pay

## 2024-02-26 DIAGNOSIS — M0579 Rheumatoid arthritis with rheumatoid factor of multiple sites without organ or systems involvement: Secondary | ICD-10-CM | POA: Diagnosis not present

## 2024-03-05 ENCOUNTER — Ambulatory Visit: Admitting: Adult Health

## 2024-03-11 ENCOUNTER — Ambulatory Visit: Admitting: Adult Health

## 2024-03-13 DIAGNOSIS — M0579 Rheumatoid arthritis with rheumatoid factor of multiple sites without organ or systems involvement: Secondary | ICD-10-CM | POA: Diagnosis not present

## 2024-03-13 DIAGNOSIS — E663 Overweight: Secondary | ICD-10-CM | POA: Diagnosis not present

## 2024-03-13 DIAGNOSIS — M810 Age-related osteoporosis without current pathological fracture: Secondary | ICD-10-CM | POA: Diagnosis not present

## 2024-03-13 DIAGNOSIS — Z79899 Other long term (current) drug therapy: Secondary | ICD-10-CM | POA: Diagnosis not present

## 2024-03-13 DIAGNOSIS — Z6826 Body mass index (BMI) 26.0-26.9, adult: Secondary | ICD-10-CM | POA: Diagnosis not present

## 2024-03-13 DIAGNOSIS — R9389 Abnormal findings on diagnostic imaging of other specified body structures: Secondary | ICD-10-CM | POA: Diagnosis not present

## 2024-03-13 DIAGNOSIS — M25531 Pain in right wrist: Secondary | ICD-10-CM | POA: Diagnosis not present

## 2024-03-13 DIAGNOSIS — M1991 Primary osteoarthritis, unspecified site: Secondary | ICD-10-CM | POA: Diagnosis not present

## 2024-03-30 ENCOUNTER — Other Ambulatory Visit: Payer: Self-pay

## 2024-03-30 ENCOUNTER — Emergency Department (HOSPITAL_BASED_OUTPATIENT_CLINIC_OR_DEPARTMENT_OTHER)
Admission: EM | Admit: 2024-03-30 | Discharge: 2024-03-30 | Disposition: A | Discharge status: Home / Self Care | Attending: Emergency Medicine | Admitting: Emergency Medicine

## 2024-03-30 ENCOUNTER — Emergency Department (HOSPITAL_BASED_OUTPATIENT_CLINIC_OR_DEPARTMENT_OTHER): Admitting: Radiology

## 2024-03-30 ENCOUNTER — Emergency Department (HOSPITAL_BASED_OUTPATIENT_CLINIC_OR_DEPARTMENT_OTHER)

## 2024-03-30 DIAGNOSIS — R0602 Shortness of breath: Secondary | ICD-10-CM | POA: Insufficient documentation

## 2024-03-30 DIAGNOSIS — R0789 Other chest pain: Secondary | ICD-10-CM | POA: Insufficient documentation

## 2024-03-30 DIAGNOSIS — Z7982 Long term (current) use of aspirin: Secondary | ICD-10-CM | POA: Diagnosis not present

## 2024-03-30 DIAGNOSIS — E039 Hypothyroidism, unspecified: Secondary | ICD-10-CM | POA: Diagnosis not present

## 2024-03-30 DIAGNOSIS — I10 Essential (primary) hypertension: Secondary | ICD-10-CM | POA: Diagnosis not present

## 2024-03-30 DIAGNOSIS — I2510 Atherosclerotic heart disease of native coronary artery without angina pectoris: Secondary | ICD-10-CM | POA: Diagnosis not present

## 2024-03-30 DIAGNOSIS — M810 Age-related osteoporosis without current pathological fracture: Secondary | ICD-10-CM | POA: Insufficient documentation

## 2024-03-30 DIAGNOSIS — J45909 Unspecified asthma, uncomplicated: Secondary | ICD-10-CM | POA: Diagnosis not present

## 2024-03-30 LAB — BASIC METABOLIC PANEL WITH GFR
Anion gap: 13 (ref 5–15)
BUN: 31 mg/dL — ABNORMAL HIGH (ref 8–23)
CO2: 25 mmol/L (ref 22–32)
Calcium: 9.9 mg/dL (ref 8.9–10.3)
Chloride: 104 mmol/L (ref 98–111)
Creatinine, Ser: 0.84 mg/dL (ref 0.44–1.00)
GFR, Estimated: >60 mL/min
Glucose, Bld: 115 mg/dL — ABNORMAL HIGH (ref 70–99)
Potassium: 4.5 mmol/L (ref 3.5–5.1)
Sodium: 142 mmol/L (ref 135–145)

## 2024-03-30 LAB — RESP PANEL BY RT-PCR (RSV, FLU A&B, COVID)  RVPGX2
Influenza A by PCR: NEGATIVE
Influenza B by PCR: NEGATIVE
Resp Syncytial Virus by PCR: NEGATIVE
SARS Coronavirus 2 by RT PCR: NEGATIVE

## 2024-03-30 LAB — CBC
HCT: 39.4 % (ref 36.0–46.0)
Hemoglobin: 13.3 g/dL (ref 12.0–15.0)
MCH: 29.8 pg (ref 26.0–34.0)
MCHC: 33.8 g/dL (ref 30.0–36.0)
MCV: 88.3 fL (ref 80.0–100.0)
Platelets: 293 K/uL (ref 150–400)
RBC: 4.46 MIL/uL (ref 3.87–5.11)
RDW: 14.6 % (ref 11.5–15.5)
WBC: 9.4 K/uL (ref 4.0–10.5)
nRBC: 0 % (ref 0.0–0.2)

## 2024-03-30 LAB — TROPONIN T, HIGH SENSITIVITY
Troponin T High Sensitivity: <15 ng/L (ref 0–19)
Troponin T High Sensitivity: <15 ng/L (ref 0–19)

## 2024-03-30 MED ORDER — OXYCODONE-ACETAMINOPHEN 5-325 MG PO TABS
1.0000 | ORAL_TABLET | Freq: Once | ORAL | Status: AC
  Administered 2024-03-30: 1 via ORAL
  Filled 2024-03-30: qty 1

## 2024-03-30 MED ORDER — IOHEXOL 350 MG/ML SOLN
75.0000 mL | Freq: Once | INTRAVENOUS | Status: AC | PRN
  Administered 2024-03-30: 75 mL via INTRAVENOUS

## 2024-03-30 MED ORDER — KETOROLAC TROMETHAMINE 30 MG/ML IJ SOLN
15.0000 mg | Freq: Once | INTRAMUSCULAR | Status: AC
  Administered 2024-03-30: 15 mg via INTRAVENOUS
  Filled 2024-03-30: qty 1

## 2024-03-30 NOTE — Discharge Instructions (Signed)
 You are seen today for chest pain.  Your workup today has been reassuring.  Follow-up with your pulmonologist as scheduled.  Given your history of heart disease, also would recommend that you follow-up with your cardiologist.  If your pain worsens or is worse with exertion, you need to be reevaluated immediately.  Trial ibuprofen 400 mg scheduled every 6 hours for the next 3 to 5 days to see if this helps at all with your pain.

## 2024-03-30 NOTE — ED Provider Notes (Signed)
 "  EMERGENCY DEPARTMENT AT [facility] Provider Note   CSN: [id number] Arrival date & time: 03/30/24  0130     Patient presents with: Shortness of Breath   Brandy Townsend is a 74 y.o. female.   HPI     This is a 74 year old female who presents with chest pain and shortness of breath.  Patient reports 24-hour symptoms of worsening right sided sharp chest pain and shortness of breath.  Worse with deep inspiration.  No recent cough or fevers.  Reports she has had several recent evaluations in the emergency room and has been treated for wheezing.  She states she has not wheezed in several years.  She was told that there is some suspicion that her shortness of breath recently has been related to methotrexate.  She has stopped this medication.  She has a remote history of smoking and no known history of COPD.  She does see a pulmonologist.  Acutely felt more short of breath tonight.  Prior to Admission medications  Medication Sig Start Date End Date Taking? Authorizing Provider  abatacept  (ORENCIA ) 250 MG injection Inject 250 mg into the vein See admin instructions. Inject 250 mg IV once every month.    [provider]  albuterol  (VENTOLIN  HFA) 108 (90 Base) MCG/ACT inhaler Inhale 1-2 puffs into the lungs every 6 (six) hours as needed for wheezing or shortness of breath. 12/02/21   Parrett, Madelin RAMAN, NP  aspirin  EC 81 MG tablet Take 1 tablet (81 mg total) by mouth daily. Swallow whole. 12/17/19   Army Katheryn BROCKS, NP  budeson-glycopyrrolate -formoterol  (BREZTRI  AEROSPHERE) 160-9-4.8 MCG/ACT AERO Inhale 2 puffs into the lungs in the morning and at bedtime. Patient not taking: Reported on 02/19/2024 06/29/23   Geronimo Amel, MD  budesonide -glycopyrrolate -formoterol  (BREZTRI  AEROSPHERE) 160-9-4.8 MCG/ACT AERO inhaler Inhale 2 puffs into the lungs in the morning and at bedtime. 01/09/24   Geronimo Amel, MD  cetirizine (ZYRTEC) 10 MG tablet Take 10 mg by mouth daily.     [provider]  diclofenac  Sodium (VOLTAREN ) 1 % GEL Apply 4 g topically 4 (four) times daily. 11/12/22   Palumbo, April, MD  folic acid (FOLVITE) 1 MG tablet 1 tablet Orally Once a day; Duration: 90 days 12/12/23   [provider]  levothyroxine  (SYNTHROID ) 75 MCG tablet Take 1 tablet (75 mcg total) by mouth every morning on an empty stomach 05/04/23     meclizine  (ANTIVERT ) 12.5 MG tablet Take 1 tablet (12.5 mg total) by mouth 3 (three) times daily as needed for dizziness. 02/08/24   Garrick Charleston, MD  methotrexate (RHEUMATREX) 2.5 MG tablet Take 10 mg by mouth once a week. Patient not taking: Reported on 02/19/2024 12/12/23   [provider]  montelukast  (SINGULAIR ) 10 MG tablet Take 1 tablet (10 mg total) by mouth daily at night 04/14/23     Multiple Vitamins-Minerals (EQ MULTIVITAMINS ADULT GUMMY PO) Take 1 each by mouth daily.    [provider]  nitroGLYCERIN  (NITROSTAT ) 0.4 MG SL tablet Place 1 tablet (0.4 mg total) under the tongue every 5 (five) minutes as needed for chest pain. 07/25/23   Wyn Jackee VEAR Mickey., NP  potassium chloride  (KLOR-CON ) 10 MEQ tablet Take 1 tablet (10 mEq total) by mouth daily. 04/14/23     predniSONE  (DELTASONE ) 10 MG tablet Take 3 tablets (30 mg total) by mouth daily. Patient not taking: Reported on 02/19/2024 02/08/24   Garrick Charleston, MD  rosuvastatin  (CRESTOR ) 10 MG tablet Take 1 tablet (10  mg total) by mouth daily. 09/25/23   Delford Maude BROCKS, MD  torsemide  (DEMADEX ) 20 MG tablet Take 1 tablet (20 mg total) by mouth in the morning AND ONE-HALF tablet (10 mg total) every evening. 06/19/23     Turmeric (QC TUMERIC COMPLEX PO) Take 2 tablets by mouth daily.    [provider]  verapamil  (CALAN -SR) 120 MG CR tablet TAKE 1 TABLET (120 MG TOTAL) BY MOUTH DAILY AS NEEDED. Patient taking differently: Take 80 mg by mouth daily. 08/25/23   Wyn Jackee VEAR Mickey., NP  Vitamin D, Ergocalciferol, (DRISDOL) 1.25 MG (50000 UNIT) CAPS  capsule Take 50,000 Units by mouth every 7 (seven) days. Monday    [provider]    Allergies: Cat dander, Dust mite extract, Macrobid [nitrofurantoin monohyd macro], and Morphine  and codeine    Review of Systems  Constitutional:  Negative for fever.  Respiratory:  Positive for shortness of breath. Negative for cough.   Cardiovascular:  Positive for chest pain. Negative for leg swelling.  All other systems reviewed and are negative.   Updated Vital Signs BP 121/76 (BP Location: Right Arm)   Pulse 69   Temp 98 F (36.7 C) (Oral)   Resp 17   SpO2 100%   Physical Exam Vitals and nursing note reviewed.  Constitutional:      Appearance: She is well-developed.  HENT:     Head: Normocephalic and atraumatic.  Eyes:     Pupils: Pupils are equal, round, and reactive to light.  Cardiovascular:     Rate and Rhythm: Normal rate and regular rhythm.     Heart sounds: Normal heart sounds.  Pulmonary:     Effort: Pulmonary effort is normal. No respiratory distress.     Breath sounds: No wheezing.     Comments: Diminished breath sounds right upper lung, no active wheezing, speaking in full sentences Abdominal:     General: Bowel sounds are normal.     Palpations: Abdomen is soft.  Musculoskeletal:     Cervical back: Neck supple.  Skin:    General: Skin is warm and dry.  Neurological:     Mental Status: She is alert and oriented to person, place, and time.  Psychiatric:        Mood and Affect: Mood normal.     (all labs ordered are listed, but only abnormal results are displayed) Labs Reviewed  BASIC METABOLIC PANEL WITH GFR - Abnormal; Notable for the following components:      Result Value   Glucose, Bld 115 (*)    BUN 31 (*)    All other components within normal limits  RESP PANEL BY RT-PCR (RSV, FLU A&B, COVID)  RVPGX2  CBC  TROPONIN T, HIGH SENSITIVITY  TROPONIN T, HIGH SENSITIVITY    EKG: None  Radiology: CT Angio Chest PE W and/or Wo Contrast Result  Date: 03/30/2024 EXAM: CTA CHEST 03/30/2024 03:25:58 AM TECHNIQUE: CTA of the chest was performed without and with the administration of 75 mL of iohexol  (OMNIPAQUE ) 350 MG/ML injection. Multiplanar reformatted images are provided for review. MIP images are provided for review. Automated exposure control, iterative reconstruction, and/or weight based adjustment of the mA/kV was utilized to reduce the radiation dose to as low as reasonably achievable. COMPARISON: 12/15/2023 CLINICAL HISTORY: Pulmonary embolism (PE) suspected, high prob. FINDINGS: PULMONARY ARTERIES: Pulmonary arteries are adequately opacified for evaluation. No acute pulmonary embolus. Main pulmonary artery is normal in caliber. MEDIASTINUM: Coronary artery and aortic atherosclerosis. The heart and pericardium demonstrate no  acute abnormality. There is no acute abnormality of the thoracic aorta. LYMPH NODES: No mediastinal, hilar or axillary lymphadenopathy. LUNGS AND PLEURA: Clustered nodular densities most prominent in the upper lobes bilaterally likely small airways disease. This is unchanged since prior study. No acute confluent airspace opacities. No evidence of pleural effusion or pneumothorax. UPPER ABDOMEN: Limited images of the upper abdomen are unremarkable. SOFT TISSUES AND BONES: No acute bone or soft tissue abnormality. IMPRESSION: 1. No evidence of pulmonary embolism. 2. No acute cardiopulmonary disease. 3. Coronary artery disease, aortic atherosclerosis. Electronically signed by: Franky Crease MD 03/30/2024 03:32 AM EST RP Workstation: [id number]   DG Chest 2 View Result Date: 03/30/2024 EXAM: 2 VIEW(S) XRAY OF THE CHEST 03/30/2024 02:43:00 AM COMPARISON: 02/08/2024 CLINICAL HISTORY: chest pain FINDINGS: LUNGS AND PLEURA: No focal pulmonary opacity. No pleural effusion. No pneumothorax. HEART AND MEDIASTINUM: No acute abnormality of the cardiac and mediastinal silhouettes. BONES AND SOFT TISSUES: No acute osseous abnormality.  IMPRESSION: 1. No active cardiopulmonary disease. Electronically signed by: Franky Crease MD 03/30/2024 02:55 AM EST RP Workstation: [id number]     Procedures   Medications Ordered in the ED  oxyCODONE -acetaminophen  (PERCOCET/ROXICET) 5-325 MG per tablet 1 tablet (1 tablet Oral Given 03/30/24 0217)  iohexol  (OMNIPAQUE ) 350 MG/ML injection 75 mL (75 mLs Intravenous Contrast Given 03/30/24 0326)  ketorolac  (TORADOL ) 30 MG/ML injection 15 mg (15 mg Intravenous Given 03/30/24 0430)                                    Medical Decision Making Amount and/or Complexity of Data Reviewed Labs: ordered. Radiology: ordered.  Risk Prescription drug management.   This patient presents to the ED for concern of chest pain, this involves an extensive number of treatment options, and is a complaint that carries with it a high risk of complications and morbidity.  I considered the following differential and admission for this acute, potentially life threatening condition.  The differential diagnosis includes ACS, PE, pneumothorax, pneumonia, COPD  MDM:    This is a 74 year old female who presents with recurrent chest discomfort and some shortness of breath.  She has been seen a few times in the last month or so and has been treated with DuoNebs.  Today she is not wheezing and is having more chest discomfort.  It is not exertional and more pleuritic in nature.  Labs obtained.  EKG shows no evidence of acute ischemia or arrhythmia.  Troponin x 2 negative.  CT scan obtained to rule out PE.  No evidence of pulmonary embolism and otherwise reassuring.  Patient had some improvement with Toradol  in the emergency department.  She is due to follow-up with her pulmonologist later this week.  Also recommend follow-up with cardiology.  Will trial NSAIDs.  (Labs, imaging, consults)  Labs: I Ordered, and personally interpreted labs.  The pertinent results include: CBC, BMP, troponin x 2  Imaging Studies ordered: I ordered  imaging studies including chest x-ray, CT I independently visualized and interpreted imaging. I agree with the radiologist interpretation  Additional history obtained from chart review.  External records from outside source obtained and reviewed including prior evaluations and pulmonology notes  Cardiac Monitoring: The patient was maintained on a cardiac monitor.  If on the cardiac monitor, I personally viewed and interpreted the cardiac monitored which showed an underlying rhythm of: NS  Reevaluation: After the interventions noted above, I reevaluated the patient and found that  they have :improved  Social Determinants of Health:  lives independently  Disposition: Discharge  Co morbidities that complicate the patient evaluation  Past Medical History:  Diagnosis Date   Asthma    CAD (coronary artery disease)    Diverticulosis    Graves disease    s/p RAI   High cholesterol    Hypertension    Hypothyroidism    Nephrolithiasis    Osteoporosis    Rheumatoid arthritis (HCC)    Vertigo      Medicines Meds ordered this encounter  Medications   oxyCODONE -acetaminophen  (PERCOCET/ROXICET) 5-325 MG per tablet 1 tablet    Refill:  0   iohexol  (OMNIPAQUE ) 350 MG/ML injection 75 mL   ketorolac  (TORADOL ) 30 MG/ML injection 15 mg    I have reviewed the patients home medicines and have made adjustments as needed  Problem List / ED Course: Problem List Items Addressed This Visit   None Visit Diagnoses       Atypical chest pain    -  Primary                Final diagnoses:  Atypical chest pain    ED Discharge Orders     None          Bari Charmaine FALCON, MD 03/30/24 [phone]  "

## 2024-03-30 NOTE — ED Triage Notes (Signed)
 Pt BIB [facility] for SOB x 1 day. Pt has Asthma and has used prescribed inhaler with no relief. Pt also reports pain under right breast/rib region that woke her up from sleep. Pain worsens with deep breathing.

## 2024-04-03 NOTE — Progress Notes (Signed)
 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 Loetta, intermittent chest tightness, no wheezing or cough   wheezng started roughly the same year she quit smoking assoc with wt gain x 35lb wt gain typically better p prednisone  and better since spring 2017  On asthmanex 2 each am with rare saba needed but then Feb 10 2016 chest tightness and did not try saba > ER 02/21/16 with w/u neg w/u except for a/f levels both max sinuses> Rx Rosen= prednsione > back to baseline at time of ov with no cough or sob or chest tightness and  Not limited by breathing from desired activities including treadmill at 3.5 mph flat x 1.5 miles  Other than assoc of flares with cold/sinus infections >> No obvious patterns in day to day or daytime variability or assoc excess/ purulent sputum or mucus plugs or hemoptysis or cp or chest tightness, subjective wheeze or overt   hb symptoms. No unusual exp hx or h/o childhood pna/ asthma or knowledge of premature birth. 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  No new complaints. 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 She has not had to use her albuterol  inhaler at all and has only been using the symbicort  1-2 puffs every am.   symb 80 1-2 each am at most never using the 2 bid max  Not limited by breathing from desired activities   No cough   No obvious day to day or daytime variability or  assoc excess/ purulent sputum or mucus plugs or hemoptysis or cp or chest tightness, subjective wheeze or overt sinus or hb symptoms. No unusual exp hx or h/o childhood pna/ asthma or knowledge of premature birth.  Sleeping ok flat without nocturnal  or early am exacerbation  of respiratory  c/o's or need for noct saba. Also denies any obvious fluctuation of symptoms with weather or environmental changes or other aggravating or alleviating factors except as outlined above   Current Allergies, Complete Past Medical History, Past Surgical History, Family History, and Social History were reviewed in [name] record.  ROS  The following are not active complaints unless bolded Hoarseness, sore throat, dysphagia, dental problems, itching, sneezing,  nasal congestion or discharge of excess mucus or purulent secretions, ear ache,    fever, chills, sweats, unintended wt loss or wt gain, classically pleuritic or exertional cp,  orthopnea pnd or leg swelling, presyncope, palpitations, abdominal pain, anorexia, nausea, vomiting, diarrhea  or change in bowel habits or change in bladder habits, change in stools or change in urine, dysuria, hematuria,  rash, arthralgias, visual complaints, headache, numbness, weakness or ataxia or problems with walking or coordination,  change in mood/affect or memory.       OV 08/29/2019  Subjective:  Patient ID: Brandy Townsend, female , DOB: 09/08/1950 , age 74 y.o. , MRN: [id number] , ADDRESS: [address] [place] [address]   08/29/2019 -   Chief Complaint  Patient presents with   Consult    Abnormal CT, hx of asthma. hx of low oxygen level and cough.    Transfer of care from Dr. Ozell America to Dr. Geronimo.  HPI Brandy Townsend 74 y.o. -referred for possible abnormalities in the CT scan of the chest in April 2021 in the setting of rheumatoid arthritis 2018 (RF + and CCP > 250 per [name] over phone) and being on Arava and longstanding history of allergic asthma on dulera . 20 pack former smoking   74 year old female.  Former chartered loss adjuster.  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  She reports a many year many decade history of allergic asthma which flares up in the fall.  Between episodes she is not on any maintenance treatment.  In the fall when it flares up her previous primary care physician Dr. Loetta chou retired] would give her antibiotics and prednisone  one course and this would help it.  However in 2020 when the flareup happened she could not get access to the prednisone  and antibiotics because the primary care physician retired and in the setting of Covid it was televisit.  By the end of 2020 she ended up with 2 rounds of antibiotics and prednisone  that helped clear things up.  In the interim in the last 4 years she has had a diagnosis of rheumatoid  arthritis.  She was on methotrexate but had raised liver function test.  This seemed to help.  She failed Plaquenil and then was started on leflunomide which she is on currently.   In 2018 she did have spirometry and DLCO on pulmonary function testing that shows hyperinflation and air trapping with reduced DLCO but also normal spirometry.  In the backdrop of this in January 2020 when she underwent her first Covid vaccine.  Then in early February 2020 when she had a second Covid vaccine.  24 hours later she got hypotensive tachycardic and also hypoxemic and ended up in the ER.  I reviewed the ER notes.  She was observed in the ER for whole day and her symptoms and signs resolved and the hypoxemia also resolved.  She was discharged with a prednisone  course.  She says subsequent to that she was short of breath and fatigue for 2 weeks and this also resolved.  She did see cardiology in February 2021.  She was reassured by a normal stress test.  After that she switched primary care physicians to Dr. Garnette Ore.  She is now established with a new rheumatologist at [facility] and is seen [name] the physician's assistant for the first time on August 27, 2019 2 days ago.  She also had a thyroid  biopsy which was nondiagnostic.  On the rheumatology visit 2 days ago it was felt her rheumatoid arthritis was active.  Blood has been drawn and work-up is in progress.  Through all this primary care physician did do CT chest without contrast.  I personally visualized this.  There is evidence of tree-in-bud that is mild and subtle.  Therefore she is referred here.  However at this point in time she is asymptomatic from a respiratory standpoint no wheezing no orthopnea no proximal nocturnal dyspnea no dyspnea on exertion no cough.  However in terms of rheumatoid arthritis there is plans to increase her immunomodulation. She has been started on low dose pred now by [name]. Currently labs pending are  CRP, Quant Gold and Hep Panel   D/w [name] later -> she is considering adding DMARD -discussed and she is supportive of getting bronch and rule out opportunistic infection  ROS - per HPI  IMPRESSION: CT chest visualized 1. Very mild, stable tree-in-bud appearing opacities along the anterolateral aspect of the bilateral upper lobes. Sequelae associated with atypical infection cannot be excluded. 2. Predominant stable thyroid  goiter.   Aortic Atherosclerosis (ICD10-I70.0).     Electronically Signed   By: Suzen Dials M.D.   On: 07/22/2019 22:34    OV 10/17/2019  Subjective:  Patient ID: Brandy Townsend, female , DOB: [birth year] , age 67 y.o. , MRN: [id number] , ADDRESS: [address] [place] [address]   10/17/2019 -   Chief Complaint  Patient presents with   Follow-up    Pt is here to discuss results of the bronch. Pt states she has been doing good since last visit and denies any complaints.     HPI Brandy Townsend 74 y.o. -returns for follow-up.  At this point in time she is on prednisone  20 mg/day.  She has gained weight.  She is got some bruises.  She is not happy about her prednisone .  She denies any respiratory complaints.  Mid June 2020 when she underwent bronchoscopy with lavage.  I reviewed the results.  She is got some mild neutrophilia but organisms such as PCP and TB and fungus are negative.  She really wants to get started on a DMA RD so she can come down or get off prednisone .  I discussed with rheumatology PA [name] and she is of the understanding.  I stated there is no ILD.  They are going to focus on joint treatment.  Patient did indicate that in the fall she is at risk for respiratory exacerbations.  But she lives nearby and is agreeable to a 9-43-month follow-up.  She is supposed to get pulmonary function test today but that was not scheduled by our office.  Results for Brobeck, Linzie G (MRN [id number]) as of 10/17/2019 16:38  Ref. Range 09/12/2019  08:59  Monocyte-Macrophage-Serous Fluid Latest Ref Range: 50 - 90 % 38 (L)  Other Cells, Fluid Latest Units: % CORRELATE WITH CYTOLOGY.  Fluid Type-FCT Unknown Bronch Lavag  Color, Fluid Latest Ref Range: YELLOW  COLORLESS (A)  Total Nucleated Cell Count, Fluid Latest Ref Range: 0 - 1,000 cu mm 15  Lymphs, Fluid Latest Units: % 4  Eos, Fluid Latest Units: % 2  Appearance, Fluid Latest Ref Range: CLEAR  CLOUDY (A)  Neutrophil Count, Fluid Latest Ref Range: 0 - 25 % 56 (H)   ROS - per HPI   Patient ID: Brandy Townsend, female    DOB: [birth year], 74 y.o.   MRN: [id number]  Chief Complaint  Patient presents with   Follow-up    Cough     Referring provider: Nichole Senior, MD    12/10/2019 Acute OV : Cough  HPI: 74 year old female former smoker followed for abnormal CT chest with pulmonary infiltrates Medical history significant for rheumatoid arthritis    Patient presents for an acute office visit.  She complains of 1 week increased cough,congestion , and wheezing . Mucus is clear , hard to cough anything up.  She has underlying rheumatoid arthritis and is recently had her maintenance regimen changed to methotrexate in infliximab .    She had recent Covid test has been negative.  Her Covid vaccines are up-to-date.  She denies any fever or loss of taste or smell.   Patient is being followed for mild pulmonary infiltrates noted on CT chest.  She is negative for interstitial lung disease.  She underwent bronchoscopy in June 2021 that showed negative cultures and BAL.  Patient is followed by rheumatology and is on methotrexate and Renflexis      TEST/EVENTS :  In 2018 she did have spirometry and DLCO on pulmonary function testing that shows hyperinflation and air trapping with reduced DLCO but also normal spirometry.  CT chest July 22, 2019 showed very mild stable tree-in-bud appearing opacities along the bilateral upper lobes.  Bronchoscopy June 2021 showed cytology positive for  benign bronchial cells and pulmonary macrophages.,  AFB negative, BAL negative, M tubercular goes complex negative, fungal negative PCP negative   OV 01/23/2020  Subjective:  Patient ID: Brandy Townsend, female , DOB: 11/05/1950 , age 73 y.o. , MRN: [id number] , ADDRESS: [address] [place] [address] PCP Nichole Senior, MD Patient Care Team: Nichole Senior, MD as PCP - General (Endocrinology) Rox Charleston, MD as PCP - OBGYN (Obstetrics and Gynecology) Delford Maude BROCKS, MD as PCP - Cardiology (Cardiology)  This Provider for this visit: Treatment Team:  Attending Provider: Geronimo Amel, MD    01/23/2020 -   Chief Complaint  Patient presents with   Follow-up    Pt states she has been doing okay since last visit. States her breathing has been doing okay. Still becomes SOB but it happens out of nowhere and she isn't sure why or if it is coming due to her heart.   Rheumatoid arthritis with immunosuppression and mild pulmonary infiltrates.  BAL with neutrophilia blood culture negative-June 2021  Long history of allergic asthma previously on Dulera .  HPI Brandy Townsend 74 y.o. -returns for follow-up.  Since last seeing me she is seen nurse practitioner in September 2021.  At that time she was wheezing.  She was given Depo-Medrol  and outpatient Omnicef .  She says within a few days she responded to this.  Since then she feels back to baseline but she says even at baseline she  is short of breath with exertion relieved by rest.  She says is not normal for her.  She recently had a cardiac cath and she reports this is normal.  I reviewed the chart and agree with that.  She is also dealing with her husband has esophageal cancer.  Recently was in the ER because of' false Hyperkalemia according to history.  She is currently on TNF alpha blockade and methotrexate for her rheumatoid arthritis.  She says the joint symptoms have not fully improved.  She is waiting for the third dose of the TNF  alpha blockade before she is expecting to show improvement in her symptoms.  Overall the last few months have been high level of social stress.  She thinks it is slowly settling down.  She feels like things are changed after getting her Covid vaccine.  Nevertheless she understands she is immunosuppressed and she is in need of a booster.  She has some trepidation about getting the booster.  She is willing to have a IgG antibody levels checked as a means of triage her understanding antibody response to previous Covid vaccine.  Today she pulmonary function test in my view shows obstruction with significant bronchodilator response consistent with an asthma pattern.  Documented below.  I reviewed the graph.  Nitric oxide  test and asthma control test scores are listed below and shows disease activity   Asthma Control Test ACT Total Score  01/23/2020 18  08/29/2019 24     Lab Results  Component Value Date   NITRICOXIDE 34 01/23/2020      OV 03/04/2021  Subjective:  Patient ID: Brandy Townsend, female , DOB: 15-Jan-1951 , age 9 y.o. , MRN: [id number] , ADDRESS: [address] [place] [address] PCP Nichole Senior, MD Patient Care Team: Nichole Senior, MD as PCP - General (Endocrinology) Rox Charleston, MD as PCP - OBGYN (Obstetrics and Gynecology) Delford Maude BROCKS, MD as PCP - Cardiology (Cardiology)  This Provider for this visit: Treatment Team:  Attending Provider: Geronimo Amel, MD    03/04/2021 -   Chief Complaint  Patient presents with   Follow-up    No new concerns     HPI Brandy Townsend 74 y.o. -returns for follow-up.  She tells me overall her asthma is doing well.  In September/October 2022 Dr. Reyes Loader added Singulair .  She was referred to allergist.  Work-up there has been reassuring.  She would not plan to go back to allergy clinic again.  She saw Dr. Frutoso.  She feels the Dulera  and Singulair  have helped her.  She wants a refill on her albuterol .  Her ACT control  score shows improvement.  However her exhaled nitric oxide  score is 55 and is borderline high.  However she is not feeling it.  Of note she had a fourth COVID mRNA booster vaccine against delta and oh micron.  However 2 days after that she got significant side effects of joint flare and like fatigue.  She does not want to do the mRNA vaccine again.  This is second time she has had significant side effects.  She needed prednisone  this time.  After the second shot she ended up in the ER with tachycardia.  I have advised her that mRNA vaccine should be listed as allergy.  She is agreeable.     OV 06/01/2021  Subjective:  Patient ID: Brandy Townsend, female , DOB: [birth year] , age 74 y.o. , MRN: [id number] , ADDRESS: [address] [place]  [place] [address] PCP Nichole Senior, MD Patient Care Team: Nichole Senior, MD as PCP - General (Endocrinology) Rox Charleston, MD as PCP - OBGYN (Obstetrics and Gynecology) Delford Maude BROCKS, MD as PCP - Cardiology (Cardiology)  This Provider for this visit: Treatment Team:  Attending Provider: Geronimo Amel, MD   06/01/2021 -   Chief Complaint  Patient presents with   Acute Visit    Pt has had complaints of cough and chest tightness which began overnight 3/6.    Rheumatoid arthritis with immunosuppression and mild pulmonary infiltrates.  BAL with neutrophilia blood culture negative-June 2021  Long history of allergic asthma HPI Brandy Townsend 74 y.o. -this is an acute visit.  She tells me that a few weeks ago she had cough and it went away.  Then she went to [place] .  She has returned and has been doing well.  No sick contacts.  Then all of a sudden she woke up in the middle of the night with chest pain.  It was as though there was some sharp sensation in the sternal area.  She reports history of normal stress test within a year.  She did have normal troponins in 2022 and 2021.  Review of the records indicate low risk cardiac stress test February 2021.   Cardiac cath in October 2021 showed nonobstructive coronary artery disease with moderate disease in the first OM.  But in any event later she started having significant cough with green sputum.  She is also short of breath and fatigue.  The chest pain was associated with the cough.     12/02/2021 Follow up : Asthma  Patient presents for a 19-month follow-up.  Patient has moderate persistent asthma with an allergic phenotype.  She says overall she is doing very well.  Asthma is been under good control.  She denies any flare of cough or wheezing.  ACT score today is 24.  Exhaled nitric oxide  testing is at 62 ppb.  She remains on Breo inhaler daily.  She takes Zyrtec and Singulair  daily. No increased albuterol  use .   She does have rheumatoid arthritis followed by rheumatology on Orencia . Did have flare this summer treated with steroids , none since.   Very active.  Caregiver for her husband. Husband has had esophageal cancer, has feeding tube. Goes in and out of hospital.  Has went back to work partime , 1 day a week. At [employer] .  This is known to cause asthma exacerbation.  Her maintenance medication for asthma includes Breo and Singulair   She continues to deal with easy bruising on her forearms and arms that are pressure related.  She says it is idiopathic.  She believes her platelets are normal.   OV 06/07/2022  Subjective:  Patient ID: Brandy Townsend, female , DOB: 30-Sep-1950 , age 89 y.o. , MRN: [id number] , ADDRESS: [address] [place] [address] PCP Nichole Senior, MD Patient Care Team: Nichole Senior, MD as PCP - General (Endocrinology) Rox Charleston, MD as PCP - OBGYN (Obstetrics and Gynecology) Delford Maude BROCKS, MD as PCP - Cardiology (Cardiology)  This Provider for this visit: Treatment Team:  Attending Provider: Geronimo Amel, MD    06/07/2022 -   Chief Complaint  Patient presents with   Follow-up    Asthma well controlled.  Needs refill on Breo  inhaler.  Has not used Breo in about a month.     HPI Brandy Townsend 74 y.o. -returns for follow-up.  She  last saw me personally in May 2023.  Then in September [address] Tammy Parrott.  At the time the exam nitric oxide  was slightly high.  Then after Christmas 2023 she had influenza and got another round of prednisone .  Currently she is doing well.  She ran out of Breo a month ago and she still doing well ACT scores are at baseline.  She is on Singulair  but she is out of [place].  She is wondering whether she should restart the Breo or not. FENO IS HIGH52ppb  Social - Husband who is esophageal cancer and has a feeding tube is now getting at least 350 cal orally.  He is eating peaches and cheese crackers. - Daughter is in [place] -currently she is safe    OV 01/13/2023  Subjective:  Patient ID: Brandy Townsend, female , DOB: Apr 21, 1950 , age 87 y.o. , MRN: [id number] , ADDRESS: [address] [place] [address] PCP Nichole Senior, MD Patient Care Team: Nichole Senior, MD as PCP - General (Endocrinology) Rox Charleston, MD as PCP - OBGYN (Obstetrics and Gynecology) Delford Maude BROCKS, MD as PCP - Cardiology (Cardiology)  This Provider for this visit: Treatment Team:  Attending Provider: Geronimo Amel, MD    01/13/2023 -   Chief Complaint  Patient presents with   Follow-up    Breathing is overall doing well. She has sneezing and cough with clear sputum in the mornings. FENO= 66 today.      HPI Brandy Townsend 74 y.o. -returns for follow-up.  I saw her last in the spring 2024.  After that overall she was stable but December 18, 2022 she returned to the emergency department with multiple acute complaints.  I personally reviewed the external records.  Visualized the CT scan and the blood work.  Diagnose of acute bronchitis based on CT scan findings pulmonary embolism ruled out.  Right upper lobe tree-in-bud infiltrates noted.  But I do not appreciate that.  She says she was having head  numbness shortness of breath chest pressure but no cough or wheezing.  She was treated with antibiotic and she is at baseline.  She feels great ACT score is good but nitric oxide  is actually gone up from her baseline of 50 to greater than 60.  She is surprised by this.  We she is interested in therapeutic options she is worried her asthma would be getting out of control.  Review of the labs indicate she always has high elevated baseline eosinophils.  Skin allergy testing with Dr. Frutoso was positive some years ago  Of note for the last 2 years she is on Orencia  which is known to cause respiratory exacerbations particularly in COPD patients.  She did have a recent infusion of that.  She thought her MCHC in the blood work was low but at least in September in our records it was normal and I showed her this.    FeNO 50    04/12/2023- Interim hx  Patient presents today for 6 week follow-up.   Discussed the use of AI scribe software for clinical note transcription with the patient, who gave verbal consent to proceed.  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 This was her first experience with general anesthesia. Post-surgery, the patient reported no pain but took prescribed hydrocodone  as a precaution. Approximately 30 minutes later, she woke up with discomfort in  the back of her head. Upon checking her oxygen level, it was found to be at 80. After using her rescue inhaler twice, the level increased to 84, but she was advised to go to the ER.  At the ER, the patient's oxygen level fluctuated, dropping low but rebounding quickly. She was discharged the next day, but her oxygen levels remained lower than usual, staying around 93-94. The patient has not experienced levels below 92 since the surgery.  In the week following the surgery, the patient reported feeling like she  developed a kidney stone, had a stomach bug, and picked up a congestion bug from an ER visit with her husband. Despite these issues, her oxygen levels have not dropped below 93.  The patient has been using an incentive spirometer almost daily since the surgery. She also reported a cough, but it is not producing anything and she believes it is more related to postnasal drip. The patient has been using Breztri  for her asthma and her nitric oxide  levels have decreased from 60 to 50 since starting this medication.  The patient also has a history of rheumatoid arthritis and has been on Orencia  for two and a half years. She reported that her hand pain, which was previously severe, has significantly improved since switching her turmeric supplement in mid-October. She has a little hip pain, but no other pain.        OV 06/29/2023  Subjective:  Patient ID: Brandy Townsend, female , DOB: 10/31/1950 , age 64 y.o. , MRN: [id number] , ADDRESS: [address] [place] [address] PCP Nichole Senior, MD Patient Care Team: Nichole Senior, MD as PCP - General (Endocrinology) Rox Charleston, MD as PCP - OBGYN (Obstetrics and Gynecology) Delford Maude BROCKS, MD as PCP - Cardiology (Cardiology)  This Provider for this visit: Treatment Team:  Attending Provider: Geronimo Amel, MD    06/29/2023 -   Chief Complaint  Patient presents with   Follow-up    Breathing has overall been stable. She fractured rib picking up a heavy box a few days ago.      HPI Brandy Townsend 74 y.o. -returns for follow-up.  From an asthma perspective she is doing well.  She has tympanoplasty and she is recovering although during the tympanoplasty she says she desaturated.  The acute issue is that 1 her breast tree which works really well for his very expensive her insurance coverage this week so I gave her 2 months worth of samples giving her time to inquire with insurance about an alternative.  In addition on March 30/2025 she is  picking up somewhat of a heavy box but she has been doing this for 5 years.  Contains liquid drinks for her husband who has a PEG tube.  Later that evening she had a pop and since then excruciating pain.  She has a right rib 10 fracture.  She is communicating with the primary care physician about it.  Ends of note in September 2024 showed pulm infiltrates on the CT and this requires a 1 year follow-up.  She continues to be immunosuppressed.  Otherwise no new issues.   OV 01/09/2024  Subjective:  Patient ID: Brandy Townsend, female , DOB: February 09, 1951 , age 25 y.o. , MRN: [id number] , ADDRESS: [address] [place] [address] PCP Nichole Senior, MD Patient Care Team: Nichole Senior, MD as PCP - General (Endocrinology) Rox Charleston, MD as PCP - OBGYN (Obstetrics and Gynecology) Delford Maude BROCKS, MD as PCP -  Cardiology (Cardiology)  This Provider for this visit: Treatment Team:  Attending Provider: Geronimo Amel, MD    01/09/2024 -   Chief Complaint  Patient presents with   Interstitial Lung Disease    Pt states she has had not problems with breathing since LOV     HPI Brandy Townsend 74 y.o. -returns for routine follow-up of asthma in the setting of rheumatoid arthritis.  She is overall doing well symptoms are minimal she is exercising regularly.  It has been 5 years since her husband had cancer in remission.  They travel to [place]  which was his first trip.  They enjoyed this.  Her only main complaint is that she has easy bruising.  Between April 2025 and August 2025 she stopped her baby aspirin  but the bruising continued.  In August 2020 for she got admitted to the hospital with unstable angina per chart review.  After that she was restarted on aspirin .  This been no change in bruising because of the aspirin .  She is doing well she is exercising.  I did indicate to her the breast tree which is inhaled steroid could be contributing to the bruising but she feels it is helping for  her.  She pays a lot of money for this but she feels it is the best inhaler.  Gave her some samples.  She does not want to stop the Breztri .  Her general symptoms and exercise hypoxemia test is listed below.  Of note her CT scan of the chest showed some asymmetric breast tissue on the right side.  Unclear if it was chronic from 2023 or new.  I did show her the arrow by personal visualization of the image by the radiologist pointed out.  I asked her to talk to her primary care physician Dr. Nichole.   CT Chest data from date: 12/15/23  - personally visualized and independently interpreted : yes - my findings are: as below  IMPRESSION: 1. 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 This could be postinflammatory, or less likely might reflect chronic atypical lung infection (middle lobes and lower lobe spared). Chronically stable mild nodularity of the tracheal wall also appears to be benign.   2. Recommend ensuring patient up-to-date on screening Mammography: Asymmetric Right breast tissue appears grossly stable from a 2023 CTA.   3. Aortic Atherosclerosis (ICD10-I70.0). Coronary artery atherosclerosis.     Electronically Signed   By: VEAR Hurst M.D.   On: 12/21/2023 13:3   OV 02/13/2024  Subjective:  Patient ID: Brandy Townsend, female , DOB: [birth year] , age 46 y.o. , MRN: [id number] , ADDRESS: [address] [place] [address] PCP Nichole Senior, MD Patient Care Team: Nichole Senior, MD as PCP - General (Endocrinology) Rox Charleston, MD as PCP - OBGYN (Obstetrics and Gynecology) Delford Maude BROCKS, MD as PCP - Cardiology (Cardiology)  This Provider for this visit: Treatment Team:  Attending Provider: Geronimo Amel, MD    02/13/2024 -   Chief Complaint  Patient presents with   Acute Visit    Pt states she is having a hard time breathing, pt  states she went to ER on Thursday (02/08/2024) and went back Friday (02/09/2024) , pt stated she received breathing treatments while in ER SOB occurs w/ any activity Pt stated at the ER she was tod her lungs were damaged    HPI Brandy Townsend [address]  y.o. -REHMAT MURTAGH is a 74 year old female who presents with shortness of breath and headache. ACUTE VISIT. Just saw her a months ago  She has been experiencing difficulty taking a deep breath since Thursday, February 08, 2024, which was preceded by a severe headache on Monday, February 05, 2024. On Thursday morning, she developed shortness of breath and vomiting, prompting her to call EMS. EMS administered a breathing treatment and steroids. At the hospital, she received another breathing treatment, prednisone , and Zofran , and was prescribed prednisone  for the following day. Labs in ER only positve for mild low K. CT head showed poissilbe acute sinusitis  On Friday, February 09, 2024, she felt faint at  prirt physical therapy appointment and received another breathing treatment and IV steroids at the hospital. She continued taking prednisone  as prescribed and felt slightly better by Friday night. However, her symptoms recurred on Saturday. She has been using her inhaler, which was replaced after being lost, and reports persistent shortness of breath without cough or wheezing.  She mentions a history of elevated D-dimer levels, though blood clots were ruled out in the past. A recent chest x-ray was clear. No fever, chills, or nasal congestion. She reports back pain between her shoulder blades and bruising on her legs, - OLD STUFF  NEW : h starting methotrexate a month ago. She takes four 2.5 mg methotrexate pills x 4 pills every Sunday. OLD: She recalls a similar episode after receiving a COVID shot, which also resulted in an ER visit.  Reports some pain in upepr interscapular area  Trop 11/17 normaml CXR - visualized and NORMAL   Las BNP 2018 and  normal Last ECHO aug 2025 an dnormal Last CTA sept 2024 and no PE  FENO 02/13/2024 - 28 ppb Lab Results  Component Value Date   NITRICOXIDE 66 01/13/2023   This result suggests low (<25) Type 2 (T2) airway inflammation indicating a low likelihood of active T2-driven airway inflammation; reduced probability of response to inhaled corticosteroids.        OV 04/04/2024  Subjective:  Patient ID: Brandy Townsend, female , DOB: [birth year] , age 74 y.o. , MRN: [id number] , ADDRESS: [address] [place] [address] PCP Nichole Senior, MD Patient Care Team: Nichole Senior, MD as PCP - General (Endocrinology) Rox Charleston, MD as PCP - OBGYN (Obstetrics and Gynecology) Delford Maude BROCKS, MD as PCP - Cardiology (Cardiology)  This Provider for this visit: Treatment Team:  Attending Provider: Geronimo Amel, MD   Rheumatoid arthritis with immunosuppression and mild pulmonary infiltrates.  BAL with neutrophilia blood culture negative-June 2021  Long history of allergic asthma previously on Dulera .  04/04/2024 -   Chief Complaint  Patient presents with   Medical Management of Chronic Issues   Asthma    Breathing has improved since her last visit. She had episode of side pain and SOB 03/30/24- went to ED and was sent home. She was then dx with shingles.     HPI Brandy Townsend 74 y.o. -Brandy Townsend is a 74 year old female with rheumatoid arthritis with asthma.  She is currently using Breztri  and has requested a refill for albuterol  as she is running low. Asthma itself is stalbe  [place]     She began experiencing severe chest pain and shortness of breath last Friday night. The pain was described as a sharp sensation through her breast, accompanied by dizziness. Emergency Medical Services were called, and although her oxygen levels, heart  rate, and blood pressure were normal, she was transported to [facility] for further evaluation.  At [facility], evaluations of her heart,  lungs, and blood work were unremarkable. On Tuesday, she was told by Rocky that she likely had shingles without a rash, despite having received shingles vaccinations. She was started on Valtrex and reports improvement in her ability to take deep breaths since starting the medication.  She has a history of rheumatoid arthritis, primarily affecting her hands and feet, and was recently taken off methotrexate. She has been using a muscle relaxer prescribed nearly a year ago to manage her symptoms.   SYMPTOM SCALE -general 01/09/2024  Current weight   O2 use ra  Shortness of Breath 0 -> 5 scale with 5 being worst (score 6 If unable to do)  At rest 0  Simple tasks - showers, clothes change, eating, shaving 0  Household (dishes, doing bed, laundry) 1  Shopping 0  Walking level at own pace 1  Walking up Stairs 1  Total (30-36) Dyspnea Score 3  How bad is your cough? 0  How bad is your fatigue 3  How bad is appetiee 2  How bad is nausea 0  How bad is vomiting?  0  How bad is diarrhea? 0  How bad is anxiety? 3  How bad is depression 0  Any chronic pain - if so where and how bad Easy bruosing all limbs        SIT STAND TEST - goal 15 times   01/09/2024    O2 used ra   PRobe - finter or forehead finger   Number sit and stand completed - goal 15 15   Time taken to complete 38 sec   Resting Pulse Ox/HR/Dyspnea  99% and 74/min and dyspnea of 0/10    Peak measures 98 % and 92/min and dyspnea of 1/10   Final Pulse Ox/HR 100% and 71/min and dyspnea of 0/10   Desaturated </= 88% no   Desaturated <= 3% points no   Got Tachycardic >/= 90/min yes   Miscellaneous comments x    Simple office walk 224 (66+46 x 2) feet Pod A at [facility] x  3 laps goal with forehead probe 02/13/2024 FENO 02/13/2024 - 28 pbb and normal    O2 used ra   Number laps completed 3   Comments about pace regyakr   Resting Pulse Ox/HR 97% and 67/min   Final Pulse Ox/HR 99% and 75/min   Desaturated </= 88% no    Desaturated <= 3% points no   Got Tachycardic >/= 90/min no   Symptoms at end of test x   Miscellaneous comments x     CT Chest data from date:03/30/24  - personally visualized and independently interpreted : no - my findings are: as below  IMPRESSION: 1. No evidence of pulmonary embolism. 2. No acute cardiopulmonary disease. 3. Coronary artery disease, aortic atherosclerosis.   Electronically signed by: Franky Crease MD 03/30/2024 03:32 AM EST RP Workstation: [id number]   PFT     Latest Ref Rng & Units 01/23/2020    8:43 AM 01/13/2017    8:40 AM  PFT Results  FVC-Pre L 2.08  2.43   FVC-Predicted Pre % 69  79   FVC-Post L 2.41  2.46   FVC-Predicted Post % 80  80   Pre FEV1/FVC % % 72  73   Post FEV1/FCV % % 75  76   FEV1-Pre L 1.51  1.77   FEV1-Predicted Pre %  66  75   FEV1-Post L 1.81  1.87   DLCO uncorrected ml/min/mmHg 18.49  16.68   DLCO UNC% % 97  72   DLCO corrected ml/min/mmHg 18.38  17.68   DLCO COR %Predicted % 96  77   DLVA Predicted % 108  83   TLC L 5.38  5.90   TLC % Predicted % 109  120   RV % Predicted % 142  162        LAB RESULTS last 96 hours No results found.       has a past medical history of Asthma, CAD (coronary artery disease), Diverticulosis, Graves disease, High cholesterol, Hypertension, Hypothyroidism, Nephrolithiasis, Osteoporosis, Rheumatoid arthritis (HCC), and Vertigo.   reports that she quit smoking about 15 years ago. Her smoking use included cigarettes. She started smoking about 35 years ago. She has a 20 pack-year smoking history. She has never used smokeless tobacco.  Past Surgical History:  Procedure Laterality Date   BRONCHIAL WASHINGS  09/12/2019   Procedure: BRONCHIAL WASHINGS;  Surgeon: Geronimo Amel, MD;  Location: [facility];  Service: Endoscopy;;   CORONARY PRESSURE/FFR STUDY N/A 01/08/2020   Procedure: INTRAVASCULAR PRESSURE WIRE/FFR STUDY;  Surgeon: Jordan, Peter M, MD;  Location: [facility] INVASIVE CV LAB;   Service: Cardiovascular;  Laterality: N/A;   INNER EAR SURGERY Right 02/27/2023   LEFT HEART CATH AND CORONARY ANGIOGRAPHY N/A 01/08/2020   Procedure: LEFT HEART CATH AND CORONARY ANGIOGRAPHY;  Surgeon: Jordan, Peter M, MD;  Location: [facility] INVASIVE CV LAB;  Service: Cardiovascular;  Laterality: N/A;   LEFT HEART CATH AND CORONARY ANGIOGRAPHY N/A 11/13/2023   Procedure: LEFT HEART CATH AND CORONARY ANGIOGRAPHY;  Surgeon: Mady Bruckner, MD;  Location: [facility] INVASIVE CV LAB;  Service: Cardiovascular;  Laterality: N/A;   TYMPANOPLASTY Right 02/27/2023   Procedure: TYMPANOPLASTY;  Surgeon: Jesus Oliphant, MD;  Location: [facility];  Service: ENT;  Laterality: Right;   VIDEO BRONCHOSCOPY N/A 09/12/2019   Procedure: VIDEO BRONCHOSCOPY WITHOUT FLUORO;  Surgeon: Geronimo Amel, MD;  Location: [facility];  Service: Endoscopy;  Laterality: N/A;    Allergies[1]  Immunization History  Administered Date(s) Administered   Fluad  Quad(high Dose 65+) 01/20/2020, 12/31/2021   Fluad  Trivalent(High Dose 65+) 01/09/2023   INFLUENZA, HIGH DOSE SEASONAL PF 01/03/2017, 12/27/2018, 03/03/2021, 01/19/2024   Influenza Whole 01/04/2017   Influenza, Quadrivalent, Recombinant, Inj, Pf 01/09/2018, 01/11/2019   Influenza,inj,quad, With Preservative 12/26/2016   Influenza-Unspecified 12/26/2013, 12/27/2014, 12/27/2015   PFIZER(Purple Top)SARS-COV-2 Vaccination 04/17/2019, 05/06/2019, 01/31/2020   Pfizer Covid-19 Vaccine Bivalent Booster 64yrs & up 01/22/2021   Pneumococcal Conjugate-13 04/28/2014, 05/26/2014   Pneumococcal Polysaccharide-23 11/22/2017, 03/03/2021   Respiratory Syncytial Virus Vaccine ,Recomb Aduvanted(Arexvy ) 03/10/2022   Zoster Recombinant(Shingrix) 01/14/2019, 04/02/2019   Zoster, Live 05/22/2013, 04/01/2019    Family History  Problem Relation Age of Onset   Breast cancer Mother    Heart disease Father    Cancer Daughter 75       stage 3 breast     Current Medications[2]       Objective:   Vitals:   04/04/24 1318  BP: 110/64  Pulse: 77  SpO2: 97%  Weight: 155 lb (70.3 kg)  Height: 5' 3 (1.6 m)    Estimated body mass index is 27.46 kg/m as calculated from the following:   Height as of this encounter: 5' 3 (1.6 m).   Weight as of this encounter: 155 lb (70.3 kg).  @WEIGHTCHANGE @  [employer]   04/04/24 1318  Weight: 155 lb (70.3 kg)  Physical Exam   General: No distress. Looks well O2 at rest: no Cane present: nono Sitting in wheel chair: no Frail: no Obese: no Neuro: Alert and Oriented x 3. GCS 15. Speech normal Psych: Pleasant Resp:  Barrel Chest - no.  Wheeze - no, Crackles - no, No overt respiratory distress CVS: Normal heart sounds. Murmurs - no Ext: Stigmata of Connective Tissue Disease - no HEENT: Normal upper airway. PEERL +. No post nasal drip        Assessment/     Assessment & Plan Moderate persistent asthma without complication    PLAN Patient Instructions   Moderate persistent asthma without complication  - seems stable and well controlled   Plan  - Continue Breztri  daily;  - take samples - Continue albuterol  as needed  - refill sent   Rt Sided Neuropathic pain  Glad better with shingles treatment  Plan  - per Rheum and primary    follow-up - Return in 8 weeks to see Ramswamy    FOLLOWUP    Return in about 8 weeks (around 05/30/2024) for 15 min visit, with Dr Geronimo, Face to Face Visit.    SIGNATURE    Dr. Dorethia Geronimo, M.D., F.C.C.P,  Pulmonary and Critical Care Medicine Staff Physician, [facility] Director - Interstitial Lung Disease  Program  Pulmonary Fibrosis [facility] at [facility] [place], [place], [address]  Pager: [phone], If no answer or between  15:00h - 7:00h: call [phone] Telephone: [phone]  1:54 PM 04/04/2024     [1]  Allergies Allergen Reactions   Cat Dander     Other reaction(s): congestion    Dust Mite Extract     Other reaction(s): Unknown   Macrobid [Nitrofurantoin Monohyd Macro] Other (See Comments)    Lowered potassium, caused aches and pains   Morphine  And Codeine Nausea And Vomiting  [2]  Current Outpatient Medications:    abatacept  (ORENCIA ) 250 MG injection, Inject 250 mg into the vein See admin instructions. Inject 250 mg IV once every month., Disp: , Rfl:    aspirin  EC 81 MG tablet, Take 1 tablet (81 mg total) by mouth daily. Swallow whole., Disp: 90 tablet, Rfl: 3   budesonide -glycopyrrolate -formoterol  (BREZTRI  AEROSPHERE) 160-9-4.8 MCG/ACT AERO inhaler, Inhale 2 puffs into the lungs in the morning and at bedtime., Disp: , Rfl:    budesonide -glycopyrrolate -formoterol  (BREZTRI  AEROSPHERE) 160-9-4.8 MCG/ACT AERO inhaler, Inhale 2 puffs into the lungs in the morning and at bedtime., Disp: , Rfl:    cetirizine (ZYRTEC) 10 MG tablet, Take 10 mg by mouth daily., Disp: , Rfl:    diclofenac  Sodium (VOLTAREN ) 1 % GEL, Apply 4 g topically 4 (four) times daily., Disp: 100 g, Rfl: 0   levothyroxine  (SYNTHROID ) 75 MCG tablet, Take 1 tablet (75 mcg total) by mouth every morning on an empty stomach, Disp: 90 tablet, Rfl: 4   meclizine  (ANTIVERT ) 12.5 MG tablet, Take 1 tablet (12.5 mg total) by mouth 3 (three) times daily as needed for dizziness., Disp: 30 tablet, Rfl: 0   montelukast  (SINGULAIR ) 10 MG tablet, Take 1 tablet (10 mg total) by mouth daily at night, Disp: 90 tablet, Rfl: 3   Multiple Vitamins-Minerals (EQ MULTIVITAMINS ADULT GUMMY PO), Take 1 each by mouth daily., Disp: , Rfl:    nitroGLYCERIN  (NITROSTAT ) 0.4 MG SL tablet, Place 1 tablet (0.4 mg total) under the tongue every 5 (five) minutes as needed for chest pain., Disp: 25 tablet, Rfl:  3   potassium chloride  (KLOR-CON ) 10 MEQ tablet, Take 1 tablet (10 mEq total) by mouth daily., Disp: 90 tablet, Rfl: 3   rosuvastatin  (CRESTOR ) 10 MG tablet, Take 1 tablet (10 mg total) by mouth daily., Disp: 90 tablet, Rfl: 2   torsemide   (DEMADEX ) 20 MG tablet, Take 1 tablet (20 mg total) by mouth in the morning AND ONE-HALF tablet (10 mg total) every evening., Disp: 135 tablet, Rfl: 4   Turmeric (QC TUMERIC COMPLEX PO), Take 2 tablets by mouth daily., Disp: , Rfl:    valACYclovir (VALTREX) 1000 MG tablet, Take 1,000 mg by mouth 3 (three) times daily., Disp: , Rfl:    verapamil  (CALAN -SR) 120 MG CR tablet, TAKE 1 TABLET (120 MG TOTAL) BY MOUTH DAILY AS NEEDED. (Patient taking differently: Take 80 mg by mouth daily.), Disp: 90 tablet, Rfl: 3   Vitamin D, Ergocalciferol, (DRISDOL) 1.25 MG (50000 UNIT) CAPS capsule, Take 50,000 Units by mouth every 7 (seven) days. Monday, Disp: , Rfl:    albuterol  (VENTOLIN  HFA) 108 (90 Base) MCG/ACT inhaler, Inhale 1-2 puffs into the lungs every 6 (six) hours as needed for wheezing or shortness of breath., Disp: 8 g, Rfl: 5   budeson-glycopyrrolate -formoterol  (BREZTRI  AEROSPHERE) 160-9-4.8 MCG/ACT AERO, Inhale 2 puffs into the lungs in the morning and at bedtime. (Patient not taking: Reported on 02/19/2024), Disp: , Rfl:   "

## 2024-04-04 ENCOUNTER — Encounter: Payer: Self-pay | Admitting: Internal Medicine

## 2024-04-04 ENCOUNTER — Ambulatory Visit (INDEPENDENT_AMBULATORY_CARE_PROVIDER_SITE_OTHER): Admitting: Internal Medicine

## 2024-04-04 VITALS — BP 110/64 | HR 77 | Ht 63.0 in | Wt 155.0 lb

## 2024-04-04 DIAGNOSIS — J4540 Moderate persistent asthma, uncomplicated: Secondary | ICD-10-CM

## 2024-04-04 DIAGNOSIS — Z87891 Personal history of nicotine dependence: Secondary | ICD-10-CM

## 2024-04-04 MED ORDER — ALBUTEROL SULFATE HFA 108 (90 BASE) MCG/ACT IN AERS: 1.0000 | INHALATION_SPRAY | Freq: Four times a day (QID) | RESPIRATORY_TRACT | 5 refills | Status: AC | PRN

## 2024-04-04 MED ORDER — BREZTRI AEROSPHERE 160-9-4.8 MCG/ACT IN AERO: 2.0000 | INHALATION_SPRAY | Freq: Two times a day (BID) | RESPIRATORY_TRACT | Status: AC

## 2024-04-04 NOTE — Patient Instructions (Addendum)
" °  Moderate persistent asthma without complication  - seems stable and well controlled   Plan  - Continue Breztri  daily;  - take samples - Continue albuterol  as needed  - refill sent   Rt Sided Neuropathic pain  Glad better with shingles treatment  Plan  - per Rheum and primary    follow-up - Return in 8 weeks to see Ramswamy "

## 2024-05-14 ENCOUNTER — Ambulatory Visit: Admitting: Primary Care
